# Patient Record
Sex: Female | Born: 1950 | Race: White | Hispanic: No
Health system: Midwestern US, Community
[De-identification: ages and names within clinical notes are randomized; demographics above are authoritative.]

## PROBLEM LIST (undated history)

## (undated) DIAGNOSIS — Z01811 Encounter for preprocedural respiratory examination: Secondary | ICD-10-CM

## (undated) DIAGNOSIS — I639 Cerebral infarction, unspecified: Secondary | ICD-10-CM

## (undated) DIAGNOSIS — Q211 Atrial septal defect: Secondary | ICD-10-CM

## (undated) DIAGNOSIS — E785 Hyperlipidemia, unspecified: Secondary | ICD-10-CM

## (undated) DIAGNOSIS — G43909 Migraine, unspecified, not intractable, without status migrainosus: Secondary | ICD-10-CM

## (undated) DIAGNOSIS — E119 Type 2 diabetes mellitus without complications: Secondary | ICD-10-CM

## (undated) DIAGNOSIS — Q2112 Patent foramen ovale: Secondary | ICD-10-CM

## (undated) DIAGNOSIS — J302 Other seasonal allergic rhinitis: Secondary | ICD-10-CM

## (undated) DIAGNOSIS — G4730 Sleep apnea, unspecified: Secondary | ICD-10-CM

## (undated) DIAGNOSIS — I341 Nonrheumatic mitral (valve) prolapse: Secondary | ICD-10-CM

## (undated) HISTORY — PX: ABDOMINAL HYSTERECTOMY: SHX81

## (undated) HISTORY — PX: CHOLECYSTECTOMY: SHX55

## (undated) HISTORY — DX: Atrial septal defect: Q21.1

## (undated) HISTORY — PX: CARPAL TUNNEL RELEASE: SHX101

## (undated) HISTORY — PX: TOTAL ABDOMINAL HYSTERECTOMY W/ BILATERAL SALPINGO-OOPHORECTOMY: SHX83

## (undated) HISTORY — PX: HYSTERECTOMY: SHX81

---

## 2000-03-19 NOTE — Op Note (Signed)
[facility]                                OPERATION REPORT   NAME:         Ann Jacobson, Ann Jacobson   MR #:         [id number]                    DATE:           04/09/2000   BILLING #:    [id number]                   PT. LOCATION:   SS #          [id number]   W. SCOTT WOODDELL, DPM   cc:   W. SCOTT WOODDELL, DPM   LANIER SYSTEM DID NOT RECORD ANY SOUND OR DICTATION ON THIS REPORT

## 2000-03-19 NOTE — Op Note (Signed)
[facility]                                OPERATION REPORT   NAME:         Jacobson, Ann   MR #:         [id number]                    DATE:           03/19/2000   BILLING #:                                PT. LOCATION:   SS #   W. SCOTT WOODDELL, DPM   cc:   W. SCOTT WOODDELL, DPM   PREOPERATIVE DIAGNOSIS:   Chronic plantar fasciitis, left foot.   POSTOPERATIVE DIAGNOSIS:   Same.   OPERATIVE PROCEDURE:   Plantar fasciotomy of left foot.   SURGEON:   W. Scott Wooddell, D.P.M.   ANESTHESIA:   Local with I.V. sedation.   DESCRIPTION OF PROCEDURE:  The patient was brought to the operating room   and placed on the operating room table in the supine position.  Following   I.V. sedation, local anesthesia was achieved utilizing a posterior tibial   nerve block administered to the left ankle.  The foot was then prepped and   draped in the usual aseptic technique and following exsanguination,   hemostasis was achieved via the use of a pneumatic ankle tourniquet set at   250 millimeters of mercury.   At this time, attention was directed to the plantar aspect of the left   foot.  Just distal to the plantar calcaneal fat pad, a transverse 2   centimeter incision was placed.  The incision was carried down via blunt   dissection to the level of the plantar fascia.  Utilizing a #64 surgical   Beaver blade, the medial 2/3 of the thick and fibrotic plantar fascial band   were incised and released.  The wound was flushed with sterile saline   solution and the wound closed with 4-0 Prolene in a single interrupted   fashion.   Following application of a sterile postoperative dressing, the pneumatic   ankle tourniquet was deflated and immediate vascular return was noted to   the digits.  The patient tolerated the   surgery and anesthesia well and left the OR with vital signs stable and   vascular status to the foot intact.

## 2000-04-09 NOTE — Op Note (Signed)
[facility]                                OPERATION REPORT   NAMEGLORIANN, Ann Jacobson   MR #:         [id number]                    DATE:           04/09/2000   BILLING #:    [id number]                   PT. LOCATION:   SS #          [id number]   W. Doroteo Bradford, DPM   cc:   W. SCOTT WOODDELL, DPM   LANIER SYSTEM DID NOT RECORD ANY SOUND OR DICTATION ON THIS REPORT

## 2000-04-22 NOTE — Op Note (Signed)
[facility]                                OPERATION REPORT   NAMEEMELDA, KOHLBECK   MR #:         [id number]                    DATE:           03/19/2000   BILLING #:                                PT. LOCATION:   SS #   W. Doroteo Bradford, DPM   cc:   W. SCOTT WOODDELL, DPM   PREOPERATIVE DIAGNOSIS:   Chronic plantar fasciitis, left foot.   POSTOPERATIVE DIAGNOSIS:   Same.   OPERATIVE PROCEDURE:   Plantar fasciotomy of left foot.   SURGEON:   Gerre Couch, D.P.M.   ANESTHESIA:   Local with I.V. sedation.   DESCRIPTION OF PROCEDURE:  The patient was brought to the operating room   and placed on the operating room table in the supine position.  Following   I.V. sedation, local anesthesia was achieved utilizing a posterior tibial   nerve block administered to the left ankle.  The foot was then prepped and   draped in the usual aseptic technique and following exsanguination,   hemostasis was achieved via the use of a pneumatic ankle tourniquet set at   250 millimeters of mercury.   At this time, attention was directed to the plantar aspect of the left   foot.  Just distal to the plantar calcaneal fat pad, a transverse 2   centimeter incision was placed.  The incision was carried down via blunt   dissection to the level of the plantar fascia.  Utilizing a #64 surgical   Beaver blade, the medial 2/3 of the thick and fibrotic plantar fascial band   were incised and released.  The wound was flushed with sterile saline   solution and the wound closed with 4-0 Prolene in a single interrupted   fashion.   Following application of a sterile postoperative dressing, the pneumatic   ankle tourniquet was deflated and immediate vascular return was noted to   the digits.  The patient tolerated the   surgery and anesthesia well and left the OR with vital signs stable and   vascular status to the foot intact.

## 2009-08-01 ENCOUNTER — Ambulatory Visit: Payer: Self-pay | Admitting: Internal Medicine

## 2010-04-27 ENCOUNTER — Ambulatory Visit: Payer: Self-pay | Admitting: Ophthalmology

## 2010-05-16 ENCOUNTER — Ambulatory Visit: Payer: Self-pay | Admitting: Ophthalmology

## 2011-09-05 ENCOUNTER — Ambulatory Visit: Payer: Self-pay | Admitting: Internal Medicine

## 2011-12-06 ENCOUNTER — Ambulatory Visit: Payer: Self-pay | Admitting: Unknown Physician Specialty

## 2013-01-19 ENCOUNTER — Ambulatory Visit: Payer: Self-pay | Admitting: Internal Medicine

## 2013-02-24 ENCOUNTER — Encounter (HOSPITAL_COMMUNITY): Payer: Self-pay | Admitting: Emergency Medicine

## 2013-02-24 ENCOUNTER — Emergency Department (HOSPITAL_COMMUNITY): Payer: Managed Care, Other (non HMO)

## 2013-02-24 ENCOUNTER — Inpatient Hospital Stay (HOSPITAL_COMMUNITY): Payer: Managed Care, Other (non HMO)

## 2013-02-24 ENCOUNTER — Inpatient Hospital Stay (HOSPITAL_COMMUNITY)
Admission: EM | Admit: 2013-02-24 | Discharge: 2013-03-03 | DRG: 065 | Disposition: A | Discharge status: Rehabilitation Facility | Payer: Managed Care, Other (non HMO) | Attending: Neurology | Admitting: Neurology

## 2013-02-24 DIAGNOSIS — Z006 Encounter for examination for normal comparison and control in clinical research program: Secondary | ICD-10-CM

## 2013-02-24 DIAGNOSIS — R1310 Dysphagia, unspecified: Secondary | ICD-10-CM | POA: Diagnosis present

## 2013-02-24 DIAGNOSIS — R29810 Facial weakness: Secondary | ICD-10-CM | POA: Diagnosis present

## 2013-02-24 DIAGNOSIS — Z833 Family history of diabetes mellitus: Secondary | ICD-10-CM

## 2013-02-24 DIAGNOSIS — M25511 Pain in right shoulder: Secondary | ICD-10-CM

## 2013-02-24 DIAGNOSIS — Z823 Family history of stroke: Secondary | ICD-10-CM

## 2013-02-24 DIAGNOSIS — I6340 Cerebral infarction due to embolism of unspecified cerebral artery: Principal | ICD-10-CM | POA: Diagnosis present

## 2013-02-24 DIAGNOSIS — Z23 Encounter for immunization: Secondary | ICD-10-CM

## 2013-02-24 DIAGNOSIS — T68XXXA Hypothermia, initial encounter: Secondary | ICD-10-CM

## 2013-02-24 DIAGNOSIS — I6350 Cerebral infarction due to unspecified occlusion or stenosis of unspecified cerebral artery: Secondary | ICD-10-CM

## 2013-02-24 DIAGNOSIS — R471 Dysarthria and anarthria: Secondary | ICD-10-CM | POA: Diagnosis present

## 2013-02-24 DIAGNOSIS — E876 Hypokalemia: Secondary | ICD-10-CM

## 2013-02-24 DIAGNOSIS — IMO0001 Reserved for inherently not codable concepts without codable children: Secondary | ICD-10-CM

## 2013-02-24 DIAGNOSIS — D509 Iron deficiency anemia, unspecified: Secondary | ICD-10-CM | POA: Diagnosis present

## 2013-02-24 DIAGNOSIS — Z6841 Body Mass Index (BMI) 40.0 and over, adult: Secondary | ICD-10-CM

## 2013-02-24 DIAGNOSIS — H53469 Homonymous bilateral field defects, unspecified side: Secondary | ICD-10-CM | POA: Diagnosis present

## 2013-02-24 DIAGNOSIS — G4733 Obstructive sleep apnea (adult) (pediatric): Secondary | ICD-10-CM

## 2013-02-24 DIAGNOSIS — I824Z9 Acute embolism and thrombosis of unspecified deep veins of unspecified distal lower extremity: Secondary | ICD-10-CM | POA: Diagnosis present

## 2013-02-24 DIAGNOSIS — I639 Cerebral infarction, unspecified: Secondary | ICD-10-CM | POA: Diagnosis present

## 2013-02-24 DIAGNOSIS — Q2112 Patent foramen ovale: Secondary | ICD-10-CM

## 2013-02-24 DIAGNOSIS — E785 Hyperlipidemia, unspecified: Secondary | ICD-10-CM | POA: Diagnosis present

## 2013-02-24 DIAGNOSIS — Z880 Allergy status to penicillin: Secondary | ICD-10-CM

## 2013-02-24 DIAGNOSIS — Q211 Atrial septal defect: Secondary | ICD-10-CM

## 2013-02-24 DIAGNOSIS — R680 Hypothermia, not associated with low environmental temperature: Secondary | ICD-10-CM | POA: Diagnosis present

## 2013-02-24 DIAGNOSIS — G8190 Hemiplegia, unspecified affecting unspecified side: Secondary | ICD-10-CM | POA: Diagnosis present

## 2013-02-24 DIAGNOSIS — I63511 Cerebral infarction due to unspecified occlusion or stenosis of right middle cerebral artery: Secondary | ICD-10-CM

## 2013-02-24 DIAGNOSIS — Z9089 Acquired absence of other organs: Secondary | ICD-10-CM

## 2013-02-24 DIAGNOSIS — M25519 Pain in unspecified shoulder: Secondary | ICD-10-CM | POA: Diagnosis present

## 2013-02-24 DIAGNOSIS — Q2111 Secundum atrial septal defect: Secondary | ICD-10-CM

## 2013-02-24 DIAGNOSIS — I82459 Acute embolism and thrombosis of unspecified peroneal vein: Secondary | ICD-10-CM

## 2013-02-24 HISTORY — DX: Migraine, unspecified, not intractable, without status migrainosus: G43.909

## 2013-02-24 HISTORY — DX: Nonrheumatic mitral (valve) prolapse: I34.1

## 2013-02-24 HISTORY — DX: Sleep apnea, unspecified: G47.30

## 2013-02-24 HISTORY — DX: Type 2 diabetes mellitus without complications: E11.9

## 2013-02-24 LAB — CBC
HCT: 39.2 % (ref 36.0–46.0)
Hemoglobin: 13.5 g/dL (ref 12.0–15.0)
MCH: 29.3 pg (ref 26.0–34.0)
MCHC: 34.4 g/dL (ref 30.0–36.0)
MCV: 85.2 fL (ref 78.0–100.0)
RDW: 14 % (ref 11.5–15.5)
WBC: 12.6 10*3/uL — ABNORMAL HIGH (ref 4.0–10.5)

## 2013-02-24 LAB — POCT I-STAT, CHEM 8
BUN: 19 mg/dL (ref 6–23)
Chloride: 107 mEq/L (ref 96–112)
Creatinine, Ser: 1 mg/dL (ref 0.50–1.10)
HCT: 40 % (ref 36.0–46.0)
Hemoglobin: 13.6 g/dL (ref 12.0–15.0)
Potassium: 3.4 mEq/L — ABNORMAL LOW (ref 3.5–5.1)
Sodium: 141 mEq/L (ref 135–145)
TCO2: 18 mmol/L (ref 0–100)

## 2013-02-24 LAB — RAPID URINE DRUG SCREEN, HOSP PERFORMED
Opiates: POSITIVE — AB
Tetrahydrocannabinol: NOT DETECTED

## 2013-02-24 LAB — TROPONIN I: Troponin I: <0.3 ng/mL (ref <0.30)

## 2013-02-24 LAB — URINALYSIS, ROUTINE W REFLEX MICROSCOPIC
Glucose, UA: NEGATIVE mg/dL
Hgb urine dipstick: NEGATIVE
Leukocytes, UA: NEGATIVE
Nitrite: NEGATIVE
Protein, ur: NEGATIVE mg/dL
Specific Gravity, Urine: 1.022 (ref 1.005–1.030)
Urobilinogen, UA: 0.2 mg/dL (ref 0.0–1.0)
pH: 5.5 (ref 5.0–8.0)

## 2013-02-24 LAB — COMPREHENSIVE METABOLIC PANEL
ALT: 15 U/L (ref 0–35)
BUN: 19 mg/dL (ref 6–23)
CO2: 21 mEq/L (ref 19–32)
Calcium: 9.3 mg/dL (ref 8.4–10.5)
Chloride: 106 mEq/L (ref 96–112)
Creatinine, Ser: 0.8 mg/dL (ref 0.50–1.10)
GFR calc Af Amer: 90 mL/min — ABNORMAL LOW (ref >90)
GFR calc non Af Amer: 77 mL/min — ABNORMAL LOW (ref >90)
Sodium: 140 mEq/L (ref 135–145)
Total Bilirubin: 0.5 mg/dL (ref 0.3–1.2)

## 2013-02-24 LAB — APTT: aPTT: 28 seconds (ref 24–37)

## 2013-02-24 LAB — DIFFERENTIAL
Basophils Absolute: 0 10*3/uL (ref 0.0–0.1)
Eosinophils Absolute: 0.1 10*3/uL (ref 0.0–0.7)
Eosinophils Relative: 1 % (ref 0–5)
Lymphocytes Relative: 13 % (ref 12–46)
Monocytes Absolute: 0.7 10*3/uL (ref 0.1–1.0)
Monocytes Relative: 5 % (ref 3–12)
Neutro Abs: 10.2 10*3/uL — ABNORMAL HIGH (ref 1.7–7.7)

## 2013-02-24 LAB — GLUCOSE, CAPILLARY
Glucose-Capillary: 115 mg/dL — ABNORMAL HIGH (ref 70–99)
Glucose-Capillary: 214 mg/dL — ABNORMAL HIGH (ref 70–99)

## 2013-02-24 LAB — POCT I-STAT TROPONIN I: Troponin i, poc: 0 ng/mL (ref 0.00–0.08)

## 2013-02-24 LAB — CK: Total CK: 248 U/L — ABNORMAL HIGH (ref 7–177)

## 2013-02-24 MED ORDER — INSULIN ASPART 100 UNIT/ML ~~LOC~~ SOLN
0.0000 [IU] | SUBCUTANEOUS | Status: DC
Start: 2013-02-24 — End: 2013-02-25
  Administered 2013-02-24 – 2013-02-25 (×2): 3 [IU] via SUBCUTANEOUS
  Administered 2013-02-25: 5 [IU] via SUBCUTANEOUS
  Administered 2013-02-25: 3 [IU] via SUBCUTANEOUS
  Administered 2013-02-25: 5 [IU] via SUBCUTANEOUS
  Administered 2013-02-25: 3 [IU] via SUBCUTANEOUS

## 2013-02-24 MED ORDER — SODIUM CHLORIDE 0.9 % IV SOLN
INTRAVENOUS | Status: DC
  Administered 2013-02-24 – 2013-02-25 (×2): via INTRAVENOUS
  Administered 2013-03-02: 500 mL via INTRAVENOUS

## 2013-02-24 MED ORDER — MIDAZOLAM HCL 5 MG/5ML IJ SOLN
4.0000 mg | Freq: Once | INTRAMUSCULAR | Status: DC
  Administered 2013-02-24: 1 mg via INTRAVENOUS
  Administered 2013-02-24: 0.5 mg via INTRAVENOUS

## 2013-02-24 MED ORDER — ASPIRIN 325 MG PO TABS
325.0000 mg | ORAL_TABLET | Freq: Every day | ORAL | Status: DC
  Administered 2013-02-25 – 2013-03-03 (×7): 325 mg via ORAL
  Filled 2013-02-24 (×7): qty 1

## 2013-02-24 MED ORDER — SODIUM CHLORIDE 0.9 % IV SOLN
INTRAVENOUS | Status: AC
  Administered 2013-02-24: 15:00:00 via INTRAVENOUS

## 2013-02-24 MED ORDER — POTASSIUM CHLORIDE 10 MEQ/100ML IV SOLN: 10.0000 meq | INTRAVENOUS | Status: AC

## 2013-02-24 MED ORDER — POTASSIUM CHLORIDE 10 MEQ/100ML IV SOLN
10.0000 meq | INTRAVENOUS | Status: AC
  Administered 2013-02-24 – 2013-02-25 (×2): 10 meq via INTRAVENOUS
  Filled 2013-02-24 (×2): qty 100

## 2013-02-24 MED ORDER — HEPARIN SODIUM (PORCINE) 5000 UNIT/ML IJ SOLN
5000.0000 [IU] | Freq: Three times a day (TID) | INTRAMUSCULAR | Status: DC
  Administered 2013-02-24 – 2013-03-02 (×17): 5000 [IU] via SUBCUTANEOUS
  Filled 2013-02-24 (×20): qty 1

## 2013-02-24 MED ORDER — CEFAZOLIN SODIUM 1-5 GM-% IV SOLN
1.0000 g | Freq: Once | INTRAVENOUS | Status: AC
  Administered 2013-02-24: 1 g via INTRAVENOUS
  Filled 2013-02-24: qty 50

## 2013-02-24 MED ORDER — LIDOCAINE VISCOUS 2 % MT SOLN
15.0000 mL | Freq: Once | OROMUCOSAL | Status: AC
  Administered 2013-02-24: 15 mL via OROMUCOSAL
  Filled 2013-02-24: qty 15

## 2013-02-24 MED ORDER — MIDAZOLAM HCL 2 MG/2ML IJ SOLN
4.0000 mg | Freq: Once | INTRAMUSCULAR | Status: DC
  Filled 2013-02-24: qty 4

## 2013-02-24 MED ORDER — ACETAMINOPHEN 650 MG RE SUPP: 650.0000 mg | RECTAL | Status: DC | PRN

## 2013-02-24 MED ORDER — ASPIRIN 300 MG RE SUPP
300.0000 mg | Freq: Every day | RECTAL | Status: DC
  Administered 2013-02-24: 300 mg via RECTAL
  Filled 2013-02-24 (×9): qty 1

## 2013-02-24 MED ORDER — HYDROMORPHONE HCL PF 1 MG/ML IJ SOLN
1.0000 mg | Freq: Once | INTRAMUSCULAR | Status: DC
  Filled 2013-02-24: qty 1

## 2013-02-24 MED ORDER — ACETAMINOPHEN 325 MG PO TABS
650.0000 mg | ORAL_TABLET | ORAL | Status: DC | PRN
  Administered 2013-02-25 – 2013-03-01 (×4): 650 mg via ORAL
  Filled 2013-02-24 (×4): qty 2

## 2013-02-24 MED ORDER — MORPHINE SULFATE 4 MG/ML IJ SOLN
4.0000 mg | Freq: Once | INTRAMUSCULAR | Status: AC
  Administered 2013-02-24: 4 mg via INTRAVENOUS
  Filled 2013-02-24: qty 1

## 2013-02-24 MED ORDER — FENTANYL CITRATE 0.05 MG/ML IJ SOLN
100.0000 ug | Freq: Once | INTRAMUSCULAR | Status: AC
  Administered 2013-02-24: 12.5 ug via INTRAVENOUS
  Administered 2013-02-24: 25 ug via INTRAVENOUS
  Filled 2013-02-24: qty 2

## 2013-02-24 NOTE — ED Notes (Signed)
MRI placed on hold until after pt speaks with Gerri Spore, Charity fundraiser.

## 2013-02-24 NOTE — ED Notes (Signed)
Patient CBG is 115 Nurse was informed.

## 2013-02-24 NOTE — ED Notes (Signed)
Procedure complete.

## 2013-02-24 NOTE — Research (Signed)
Patient was admitted to the hospital for acute stroke. During evaluation, patient was identified as a potential IMPACT-24 trial candidate. Patient was given the informed consent to read and given the opportunity to ask questions. After review of the IMPACT-24 informed consent, patient agreed to participate. Patient however was only able to make an X on the consent for signature. The consent form was witnessed by the emergency department RN. Patient daughter also provided a second signature for surrogate consent. A copy of the informed consent was given to patient for personal record. Patient met the inclusion/exclusion criteria and was randomized into the trial. Patient underwent the implant procedure by Dr. Corliss Skains. Light sedation was used along with local anesthesia. No complication were reported during the procedure. Patient was started on ISS treatment at 20:28 and transported to 4N12.

## 2013-02-24 NOTE — H&P (Signed)
History and Physical       Hospital Admission Note Date: 02/24/2013  Patient name: Patricia Black Medical record number: [id number] Date of birth: 1950/10/19 Age: 62 y.o. Gender: female PCP: No primary provider on file.    Chief Complaint:  Left sided weakness with dizziness today  HPI: Patient is a 62 year old female with history of diabetes, migraine headaches who is otherwise very functional was brought via EMS to the ER for symptoms. History was obtained from the patient who reported that when she woke up at 5 AM this morning, she was feeling dizzy. She reached out for a copy and noticed that her left arm was weak. She also had difficulty getting out of the bed due to left leg weakness. Patient reports that she was supposed to be going to [place] today. She went out the back door and fell on the outside porch. Per patient, she laid there from 12 to 1:30 PM in the rain and was unable to get up. EMS found her outside and it brought to the ER, patient was cool to touch, hypothermic with temperature 93 degree F. Patient reports that she was at her baseline status at 11:30 PM last night when she went to bed. CT of the head shows a dense right middle cerebral artery thrombosis. Hospitalist service was requested for admission.  Review of Systems:  Constitutional: Denies fever, chills, diaphoresis, poor appetite and fatigue.  HEENT: Denies photophobia, eye pain, redness, hearing loss, ear pain, congestion, sore throat, rhinorrhea, sneezing, mouth sores, trouble swallowing, neck pain, neck stiffness and tinnitus.   Respiratory: Denies SOB, DOE, cough, chest tightness,  and wheezing.   Cardiovascular: Denies chest pain, palpitations and leg swelling.  Gastrointestinal: Denies nausea, vomiting, abdominal pain, diarrhea, constipation, blood in stool and abdominal distention.  Genitourinary: Denies dysuria, urgency, frequency, hematuria, flank pain  and difficulty urinating.  Musculoskeletal: Denies myalgias, back pain, joint swelling, arthralgias and gait problem.  Skin: Denies pallor, rash and wound.  Neurological:  please see history of present illness  Hematological: Denies adenopathy. Easy bruising, personal or family bleeding history  Psychiatric/Behavioral: Denies suicidal ideation, mood changes, confusion, nervousness, sleep disturbance and agitation  Past Medical History: Past Medical History  Diagnosis Date  . Diabetes mellitus without complication   . Migraine    Past Surgical History  Procedure Laterality Date  . Abdominal hysterectomy      Medications: Prior to Admission medications   Medication Sig Start Date End Date Taking? Authorizing Provider  atorvastatin (LIPITOR) 80 MG tablet Take 80 mg by mouth daily at 6 PM.    Historical Provider, MD  fluticasone (FLONASE) 50 MCG/ACT nasal spray Place 1 spray into both nostrils daily as needed for allergies or rhinitis.    Historical Provider, MD  Fluticasone-Salmeterol (ADVAIR) 100-50 MCG/DOSE AEPB Inhale 1 puff into the lungs 2 (two) times daily.    Historical Provider, MD  hydrochlorothiazide (HYDRODIURIL) 25 MG tablet Take 25 mg by mouth daily.    Historical Provider, MD  insulin lispro (HUMALOG) 100 UNIT/ML injection Inject into the skin 3 (three) times daily before meals.    Historical Provider, MD  propranolol ER (INDERAL LA) 160 MG SR capsule Take 160 mg by mouth daily.    Historical Provider, MD  Topiramate ER (TROKENDI XR) 100 MG CP24 Take 100 mg by mouth daily.    Historical Provider, MD  Topiramate ER (TROKENDI XR) 50 MG CP24 Take 50 mg by mouth daily.    Historical Provider, MD    Allergies:  Allergies  Allergen Reactions  . Penicillins Other (See Comments)    Unknown reaction    Social History:  reports that she has never smoked. She does not have any smokeless tobacco history on file. She reports that she does not drink alcohol or use illicit  drugs.  Family History: History reviewed. No pertinent family history.  Physical Exam: Blood pressure 110/38, pulse 53, temperature 98.1 F (36.7 C), temperature source Oral, resp. rate 12, weight 123.832 kg (273 lb), SpO2 98.00%. General: Alert, awake, oriented x3, in no acute distress, Appropriately answering questions, bair hugger on  HEENT: normocephalic, atraumatic, anicteric sclera, pink conjunctiva, pupils equal and reactive to light and accomodation, oropharynx clear Neck: supple, no masses or lymphadenopathy, no goiter, no bruits  Heart: Regular rate and rhythm, without murmurs, rubs or gallops. Lungs: Clear to auscultation bilaterally, no wheezing, rales or rhonchi. Abdomen: Soft, nontender, nondistended, positive bowel sounds, no masses. Extremities: No clubbing, cyanosis or edema with positive pedal pulses. Neuro: Left facial droop, otherwise cranial nerves are intact, mild dysarthria, left upper and lower extremity 3/5, right upper and lower extremity 5/5  Psych: alert and oriented x 3, normal mood and affect Skin: no rashes or lesions, warm and dry   LABS on Admission:  Basic Metabolic Panel:  Recent Labs Lab 02/24/13 1429 02/24/13 1513  NA 140 141  K 3.7 3.4*  CL 106 107  CO2 21  --   GLUCOSE 131* 134*  BUN 19 19  CREATININE 0.80 1.00  CALCIUM 9.3  --    Liver Function Tests:  Recent Labs Lab 02/24/13 1429  AST 18  ALT 15  ALKPHOS 139*  BILITOT 0.5  PROT 7.1  ALBUMIN 3.5   No results found for this basename: LIPASE, AMYLASE,  in the last 168 hours No results found for this basename: AMMONIA,  in the last 168 hours CBC:  Recent Labs Lab 02/24/13 1429 02/24/13 1513  WBC 12.6*  --   NEUTROABS 10.2*  --   HGB 13.5 13.6  HCT 39.2 40.0  MCV 85.2  --   PLT 241  --    Cardiac Enzymes:  Recent Labs Lab 02/24/13 1429  CKTOTAL 248*  TROPONINI <0.30   BNP: No components found with this basename: POCBNP,  CBG:  Recent Labs Lab  02/24/13 1451  GLUCAP 115*     Radiological Exams on Admission: Ct Head Wo Contrast  02/24/2013   CLINICAL DATA:  Slurred speech found unresponsive  EXAM: CT HEAD WITHOUT CONTRAST  TECHNIQUE: Contiguous axial images were obtained from the base of the skull through the vertex without intravenous contrast.  COMPARISON:  None.  FINDINGS: The bony calvarium is intact. No gross soft tissue abnormality is noted. There is density identified within the right middle cerebral artery which may represent acute thrombosis. No acute hemorrhage is seen. The lateral most aspect of the basal ganglia on the right there is some generalized decreased attenuation identified best seen on image number 16 of series 3. This is suspicious for early ischemic change. No other focal abnormality is noted.  IMPRESSION: Dense right middle cerebral artery as described.  Area of vague decreased attenuation in the right basal ganglia laterally suspicious for acute ischemia.  These results were called by telephone at the time of interpretation on 02/24/2013 at 2:56 PM to the attending emergency room physician, who verbally acknowledged these results.   Electronically Signed   By: Alcide Clever M.D.   On: 02/24/2013 14:57    Assessment/Plan Principal  Problem:  Acute CVA (cerebral infarction) CT head shows dense right middle cerebral artery thrombosis with decreased attenuation in the right basal ganglia suspicious for acute ischemia. Patient is out of TPA window, lasting normal was 11:30pm last night. -  patient will be admitted for full stroke workup, obtain MRI, MRA brain  - Obtain 2-D echo, carotid Dopplers, HbA1c, lipid panel, serial neuro checks  - N.p.o. until swallow evaluation, speech therapy, PT, OT evaluation  - Aspirin suppository until able to eat, continue IV fluid hydration - Neurology has been consulted, discussed with Dr. Amada Jupiter  Active Problems: Diabetes mellitus  - For now place on sliding scale insulin  q4hours, obtain hemoglobin A1c     Hypothermia: Improved with the Bair hugger, lasted temp 98.1, no signs of infection, although white count is 12.6 could be stress demargination - Obtain UA, chest x-ray for further workup      Hypokalemia - Replace IV   DVT prophylaxis:  HEPARIN [place]  CODE STATUS:  full CODE STATUS   Family Communication: Admission, patients condition and plan of care including tests being ordered have been discussed with the patient who indicates understanding and agree with the plan and Code Status   Further plan will depend as patient's clinical course evolves and further radiologic and laboratory data become available.   Time Spent on Admission: 1 hour  RAI,RIPUDEEP M.D. [facility] 02/24/2013, 5:34 PM Pager: [phone]  If 7PM-7AM, please contact night-coverage [email or web address] Password [email or web address]

## 2013-02-24 NOTE — Code Documentation (Signed)
62yo female arriving to [facility] at 41 via [facility].  EMS reports she was found down outside of her house by the fire department and that she fell down the stairs going to her car.  Her daughter had called looking for her when she had not heard from her as the patient was supposed to be heading out of town this morning.  Patient reports that she was at her baseline at 2330 last night when she went to bed.  She woke up at 0515 this morning with dizziness, then noticed left arm weakness when she went to grab a cup.  She reports that she went outside at 1200, but she is cold to touch and her clothes are saturated; unclear how long she has actually been outside in the rain.  Code stroke called at 1414, patient arrival at 3, LKW at 2330, EDP exam/cleared for CT at 1430, stroke team arrival at 54, neurologist arrival 53, patient arrival in CT at 49, phlebotomist arrival at 42.  NIHSS 7 on arrival, see documentation for details.  Patient is outside the window for treatment with tPA.  Research RN notified of possible enrollment in research trial.  Bedside handoff with ED RN Lowella Bandy.

## 2013-02-24 NOTE — ED Notes (Signed)
Pt BP decreased to 101/35, Dr. Elesa Massed made aware and increased fluids to 125 ml/hr.

## 2013-02-24 NOTE — Consult Note (Signed)
Neurology Consultation Reason for Consult: Stroke Referring Physician: ward, Baxter Hire  CC: Left-sided weakness  History is obtained from: Patient  HPI: Patricia Black is a 62 y.o. female with a history of diabetes and migraine who presents with left-sided weakness that was present on awakening. She states that she was normal when she went to bed last night, but when she awoke at 5 AM she was dizzy, and was dropping things from her left hand. She went out to her car, but fell on her way and was unable to get back up. When her daughter had not heard from her by mid morning she called the [facility] Department who responded and found her next to her car and called EMS.   LKW: 11:30 PM tpa given?: no, outside of window NIHSS: 7    ROS: A 14 point ROS was performed and is negative except as noted in the HPI.  Past Medical History  Diagnosis Date  . Diabetes mellitus without complication   . Migraine     Family History: Grandfather-stroke  Social History: Tob: Denies  Exam: Current vital signs: BP 111/36  Pulse 56  Temp(Src) 98.1 F (36.7 C) (Oral)  Resp 25  Wt 123.832 kg (273 lb)  SpO2 99% Vital signs in last 24 hours: Temp:  [93.9 F (34.4 C)-98.1 F (36.7 C)] 98.1 F (36.7 C) (11/26 1719) Pulse Rate:  [46-69] 56 (11/26 1940) Resp:  [11-25] 25 (11/26 1940) BP: (93-119)/(29-98) 111/36 mmHg (11/26 1940) SpO2:  [96 %-100 %] 99 % (11/26 1940) Weight:  [123.832 kg (273 lb)] 123.832 kg (273 lb) (11/26 1459)  General: In bed, NAD CV: Regular rate and rhythm Mental Status: Patient is awake, alert, oriented to person, place, month, year, and situation. Immediate and remote memory are intact. Patient is able to give a clear and coherent history. No signs of aphasia or neglect. There is no extinction to either visual stimuli or tactile stimuli, when describing the "cookie caper" she is able to describe the entire picture Cranial Nerves: II: Visual Fields are full. Pupils  are equal, round, and reactive to light.  Discs are difficult to visualize. III,IV, VI: EOMI without ptosis or diploplia.  V: Facial sensation is symmetric to temperature VII: Facial movement is decreased on the left VIII: hearing is intact to voice X: Uvula elevates symmetrically XI: Shoulder shrug is symmetric. XII: tongue is midline without atrophy or fasciculations.  Motor: Tone is normal. Bulk is normal. 5/5 strength was present in right arm and leg, she has 3/5 weakness of the left arm and leg Sensory: Sensation is symmetric to pin in the arms and legs. Deep Tendon Reflexes: 2+ and symmetric in the biceps and patellae.  Cerebellar: FNF and HKS are intact on right, difficult to commensurate with weakness on left Gait: Unable to test due to weakness   I have reviewed labs in epic and the results pertinent to this consultation are: CMP-unremarkable CBC-mild leukocytosis  I have reviewed the images obtained: CT head-dense right MCA sign, peri-insular ribbon sign  Impression: 62 year old female with right MCA territory infarct. Her low NIH is likely indicative of good collateralization. She presented after 10 hours from symptom onset and therefore was not a candidate for acute intervention, however she does meet criteria for inclusion in the impact study.   The study is looking at improving collateralization through the use of sphenopalatine ganglion stimulation. Care would proceed as is typically be done other than the study device and periodic stimulation.  Recommendations: 1. HgbA1c,  fasting lipid panel 2. MRI, MRA  of the brain without contrast 3. Frequent neuro checks 4. Echocardiogram 5. Carotid dopplers 6. Prophylactic therapy-Antiplatelet med: Aspirin - dose 325mg  7. Risk factor modification 8. Telemetry monitoring 9. PT consult, OT consult, Speech consult    Ritta Slot, MD [facility] [phone]  If 7pm- 7am, please page neurology on  call at [phone].

## 2013-02-24 NOTE — ED Notes (Signed)
Dr. Rai at bedside 

## 2013-02-24 NOTE — ED Notes (Signed)
Pt arrives via EMS from home. Reportedly went to bed feeling fine. Woke up around 0500 this AM with left sided weakness, left facial droop and slurred speech. WEnt about the morning with shuffling gait, fell walking downstairs and found outside after fall by sherriff. Pt cool to touch on arrival wearing wet clothes. Pt awake, alert, oriented x4, with above noted deficits.

## 2013-02-24 NOTE — ED Notes (Signed)
Pts daughter called for update regarding patient. Informed caller that I am unable to give pt information and results over the telephone due to HIPPA regulations and caller stated she understands and will be here tonight to visit patient. Pt made aware of daughters call.

## 2013-02-24 NOTE — ED Notes (Signed)
Beryl Meager, RN, Equities trader at bedside to assess patient.

## 2013-02-24 NOTE — ED Notes (Signed)
NOTIFIED DR. WARD IN PERSON OF PATIENTS LAB RESULTS ( CODESTROKE) ALL LAB VALUES WITHIN NORMAL LEVELS, @15 :40PM, 02/24/2013.

## 2013-02-24 NOTE — ED Provider Notes (Addendum)
TIME SEEN: 2:40 PM  CHIEF COMPLAINT: Left-sided weakness, left-sided facial droop  HPI: Patient is a 62 y.o. F with history of diabetes, migraines who presents to the emergency department with left-sided weakness and facial droop that started this morning when she woke up. Patient reports that she went to bed at 11:30 PM and felt fine. When she woke up at 5 AM she felt dizzy which she described as vertiginous and went to reach for a cup and noticed that her left arm was weak. She had difficulty walking and getting out of bed secondary to left lower extremity weakness. She fell walking down the stairs to her car.  When patient did not show up for work, her daughter was contacted. She is department, patient outside of her house in the rain. Her blood glucose was 115. She states she is having a mild headache and states it is different than her prior migraines. No thunderclap HA.  ROS: See HPI Constitutional: no fever  Eyes: no drainage  ENT: no runny nose   Cardiovascular:  no chest pain  Resp: no SOB  GI: no vomiting GU: no dysuria Integumentary: no rash  Allergy: no hives  Musculoskeletal: no leg swelling  Neurological: no slurred speech ROS otherwise negative  PAST MEDICAL HISTORY/PAST SURGICAL HISTORY:  Diabetes, migraines  MEDICATIONS:  Prior to Admission medications   Not on File    ALLERGIES:  Allergies not on file  SOCIAL HISTORY:  History  Substance Use Topics  . Smoking status: Not on file  . Smokeless tobacco: Not on file  . Alcohol Use: Not on file    FAMILY HISTORY: No family history on file.  EXAM: CONSTITUTIONAL: Alert and oriented and responds appropriately to questions. Well-appearing; well-nourished HEAD: Normocephalic EYES: Conjunctivae clear, PERRL ENT: normal nose; no rhinorrhea; moist mucous membranes; pharynx without lesions noted NECK: Supple, no meningismus, no LAD  CARD: RRR; S1 and S2 appreciated; no murmurs, no clicks, no rubs, no  gallops RESP: Normal chest excursion without splinting or tachypnea; breath sounds clear and equal bilaterally; no wheezes, no rhonchi, no rales,  ABD/GI: Normal bowel sounds; non-distended; soft, non-tender, no rebound, no guarding BACK:  The back appears normal and is non-tender to palpation, there is no CVA tenderness EXT: Normal ROM in all joints; non-tender to palpation; no edema; normal capillary refill; no cyanosis    SKIN: Normal color for age and race; warm NEURO: Patient is a weak in her left upper and lower extremity with mild sensory deficit, left facial droop, otherwise cranial nerves are intact, pt has left sided extinction and mild dysarthria, NIHSS is 9 PSYCH: The patient's mood and manner are appropriate. Grooming and personal hygiene are appropriate.  MEDICAL DECISION MAKING: Patient with a stroke scale of 9 last normal was 11:30 PM. Pt is not a TPA candidate since last normal > 12 hours ago.  CT head shows dense right middle cerebral artery with decreased attenuation in the right basal ganglia suspicious for acute ischemia. Labs are otherwise unremarkable. CK is slightly elevated at 248. Discussed with Dr. Amada Jupiter he would like MRI, MRA brain prior to disposition.   EKG Interpretation    Date/Time:  Wednesday February 24 2013 14:57:40 EST Ventricular Rate:  57 PR Interval:  70 QRS Duration: 87 QT Interval:  479 QTC Calculation: 466 R Axis:   54 Text Interpretation:  Sinus rhythm Short PR interval Left atrial enlargement Borderline T abnormalities, anterior leads Confirmed by RAY MD, DANIELLE ([id number]) on 02/24/2013 3:08:37 PM  ED PROGRESS: Neurology would like medicine admission for subacute CVA.   Patient is still hemodynamically stable with no improvement of her neurologic symptoms. Her primary care physician is Einar Crow in [facility].     Layla Maw Keyden Pavlov, DO 02/24/13 1704  Layla Maw Rhea Kaelin, DO 02/24/13 1715  Layla Maw Tziporah Knoke,  DO 02/25/13 [phone]

## 2013-02-25 DIAGNOSIS — E785 Hyperlipidemia, unspecified: Secondary | ICD-10-CM

## 2013-02-25 DIAGNOSIS — G2581 Restless legs syndrome: Secondary | ICD-10-CM

## 2013-02-25 DIAGNOSIS — I369 Nonrheumatic tricuspid valve disorder, unspecified: Secondary | ICD-10-CM

## 2013-02-25 LAB — CBC WITH DIFFERENTIAL/PLATELET
Basophils Absolute: 0 10*3/uL (ref 0.0–0.1)
Basophils Relative: 0 % (ref 0–1)
Eosinophils Absolute: 0.1 10*3/uL (ref 0.0–0.7)
Eosinophils Relative: 1 % (ref 0–5)
HCT: 33.5 % — ABNORMAL LOW (ref 36.0–46.0)
Lymphs Abs: 1.9 10*3/uL (ref 0.7–4.0)
MCH: 28.9 pg (ref 26.0–34.0)
MCV: 85 fL (ref 78.0–100.0)
Monocytes Absolute: 0.6 10*3/uL (ref 0.1–1.0)
Neutrophils Relative %: 71 % (ref 43–77)
Platelets: 205 10*3/uL (ref 150–400)
RBC: 3.94 MIL/uL (ref 3.87–5.11)
RDW: 14.1 % (ref 11.5–15.5)

## 2013-02-25 LAB — COMPREHENSIVE METABOLIC PANEL
ALT: 13 U/L (ref 0–35)
AST: 21 U/L (ref 0–37)
Albumin: 3 g/dL — ABNORMAL LOW (ref 3.5–5.2)
Calcium: 8.4 mg/dL (ref 8.4–10.5)
GFR calc Af Amer: 82 mL/min — ABNORMAL LOW (ref >90)
Glucose, Bld: 247 mg/dL — ABNORMAL HIGH (ref 70–99)
Sodium: 137 mEq/L (ref 135–145)
Total Protein: 6.1 g/dL (ref 6.0–8.3)

## 2013-02-25 LAB — GLUCOSE, CAPILLARY
Glucose-Capillary: 211 mg/dL — ABNORMAL HIGH (ref 70–99)
Glucose-Capillary: 288 mg/dL — ABNORMAL HIGH (ref 70–99)

## 2013-02-25 LAB — LIPID PANEL
Cholesterol: 118 mg/dL (ref 0–200)
HDL: 35 mg/dL — ABNORMAL LOW (ref >39)
LDL Cholesterol: 61 mg/dL (ref 0–99)
Total CHOL/HDL Ratio: 3.4 RATIO

## 2013-02-25 LAB — HEMOGLOBIN A1C
Hgb A1c MFr Bld: 7.2 % — ABNORMAL HIGH (ref <5.7)
Mean Plasma Glucose: 160 mg/dL — ABNORMAL HIGH (ref <117)

## 2013-02-25 MED ORDER — INSULIN ASPART 100 UNIT/ML ~~LOC~~ SOLN
0.0000 [IU] | Freq: Three times a day (TID) | SUBCUTANEOUS | Status: DC
  Administered 2013-02-26: 3 [IU] via SUBCUTANEOUS
  Administered 2013-02-26: 5 [IU] via SUBCUTANEOUS
  Administered 2013-02-26: 7 [IU] via SUBCUTANEOUS
  Administered 2013-02-27 – 2013-02-28 (×4): 5 [IU] via SUBCUTANEOUS
  Administered 2013-02-28 (×2): 7 [IU] via SUBCUTANEOUS
  Administered 2013-03-01: 5 [IU] via SUBCUTANEOUS
  Administered 2013-03-01: 7 [IU] via SUBCUTANEOUS
  Administered 2013-03-01: 5 [IU] via SUBCUTANEOUS
  Administered 2013-03-02: 3 [IU] via SUBCUTANEOUS
  Administered 2013-03-02 – 2013-03-03 (×5): 5 [IU] via SUBCUTANEOUS

## 2013-02-25 MED ORDER — INSULIN ASPART 100 UNIT/ML ~~LOC~~ SOLN
0.0000 [IU] | Freq: Every day | SUBCUTANEOUS | Status: DC
  Administered 2013-02-25: 2 [IU] via SUBCUTANEOUS
  Administered 2013-02-26 – 2013-03-02 (×5): 3 [IU] via SUBCUTANEOUS

## 2013-02-25 MED ORDER — LIDOCAINE VISCOUS 2 % MT SOLN
15.0000 mL | Freq: Four times a day (QID) | OROMUCOSAL | Status: DC | PRN
  Administered 2013-02-25 – 2013-02-26 (×2): 15 mL via OROMUCOSAL
  Filled 2013-02-25 (×3): qty 15

## 2013-02-25 MED ORDER — ATORVASTATIN CALCIUM 80 MG PO TABS
80.0000 mg | ORAL_TABLET | Freq: Every day | ORAL | Status: DC
  Administered 2013-02-25 – 2013-03-03 (×7): 80 mg via ORAL
  Filled 2013-02-25 (×7): qty 1

## 2013-02-25 MED ORDER — PNEUMOCOCCAL VAC POLYVALENT 25 MCG/0.5ML IJ INJ
0.5000 mL | INJECTION | INTRAMUSCULAR | Status: AC
  Administered 2013-02-27: 0.5 mL via INTRAMUSCULAR
  Filled 2013-02-25: qty 0.5

## 2013-02-25 MED ORDER — INSULIN ASPART 100 UNIT/ML ~~LOC~~ SOLN: 0.0000 [IU] | Freq: Three times a day (TID) | SUBCUTANEOUS | Status: DC

## 2013-02-25 NOTE — Evaluation (Signed)
Clinical/Bedside Swallow Evaluation Patient Details  Name: Patricia Black MRN: [id number] Date of Birth: Jul 27, 1950  Today's Date: 02/25/2013 Time: 7829-5621 SLP Time Calculation (min): 25 min  Past Medical History:  Past Medical History  Diagnosis Date  . Diabetes mellitus without complication   . Migraine    Past Surgical History:  Past Surgical History  Procedure Laterality Date  . Abdominal hysterectomy     HPI:  Patricia Black is a 62 y.o. female with a history of diabetes and migraine who presents with left-sided weakness that was present on awakening. She states that she was normal when she went to bed last night, but when she awoke at 5 AM she was dizzy, and was dropping things from her left hand. She went out to her car, but fell on her way and was unable to get back up. When her daughter had not heard from her by mid morning she called the Patricia Black who responded and found her next to her car and called EMS. Pt failed stroke swallow screen on straw sip.   Assessment / Plan / Recommendation Clinical Impression  Pt demonstrated immediate cough with sips of thin liquid from straw- 2 of 6 sips. When cued to take small sips, no cough occurred. No s/s of aspiration when straw removed. Pt demonstrated mildly prolonged oral phase with solid but no oral residue was present. Pt had difficulties feeding herself applesauce with L hand; when cued to switch to R, no problems. Given these findings, pt is at high risk of aspiration with straw sips of thin liquid, but risk is significantly reduced with cup sips. Rx thin liquid diet, no straws, regular foods. Meds whole in puree. Intermittent supervision needed to cue for small sips/ bites. Speech will continue to follow for diet tolerance/ advancement.    Aspiration Risk  Mild (with removal of straws)    Diet Recommendation Regular;Thin liquid   Liquid Administration via: Cup;No straw Medication Administration: Whole meds with  puree Supervision: Intermittent supervision to cue for compensatory strategies Compensations: Slow rate;Small sips/bites    Other  Recommendations Oral Care Recommendations: Oral care BID   Follow Up Recommendations       Frequency and Duration min 2x/week  2 weeks   Pertinent Vitals/Pain n/a    SLP Swallow Goals     Swallow Study Prior Functional Status       General HPI: Patricia Black is a 62 y.o. female with a history of diabetes and migraine who presents with left-sided weakness that was present on awakening. She states that she was normal when she went to bed last night, but when she awoke at 5 AM she was dizzy, and was dropping things from her left hand. She went out to her car, but fell on her way and was unable to get back up. When her daughter had not heard from her by mid morning she called the Patricia Black who responded and found her next to her car and called EMS. Pt failed stroke swallow screen on straw sip. Type of Study: Bedside swallow evaluation Diet Prior to this Study: NPO Temperature Spikes Noted: No Respiratory Status: Room air History of Recent Intubation: No Behavior/Cognition: Alert;Cooperative Oral Cavity - Dentition: Adequate natural dentition Self-Feeding Abilities: Able to feed self;Other (Comment) (Pt left handed; now needs to eat w R hand- may need cueing) Patient Positioning: Upright in bed Baseline Vocal Quality: Clear Volitional Cough: Strong Volitional Swallow: Able to elicit    Oral/Motor/Sensory Function Overall Oral Motor/Sensory Function: Impaired  Labial ROM: Reduced left Labial Symmetry: Abnormal symmetry left Labial Strength: Within Functional Limits Labial Sensation: Within Functional Limits Lingual ROM: Within Functional Limits Lingual Symmetry: Within Functional Limits   Ice Chips Ice chips: Not tested   Thin Liquid Thin Liquid: Impaired Presentation: Cup;Straw Pharyngeal  Phase Impairments: Cough - Immediate;Other  (comments) (on 2 of 6 straw sips; OK with cup)    Nectar Thick Nectar Thick Liquid: Not tested   Honey Thick Honey Thick Liquid: Not tested   Puree Puree: Within functional limits Presentation: Self Fed;Spoon   Solid   GO    Solid: Within functional limits Presentation: Self Fed       Nour Rodrigues K, MA, CCC-SLP 02/25/2013,9:28 AM

## 2013-02-25 NOTE — Progress Notes (Signed)
Patricia Black ZOX:[id number] DOB: 1951-02-25 DOA: 02/24/2013 PCP: No primary provider on file.  Brief narrative: 62 year old female known history diabetes migraines admitted for left arm weak in his left leg weakness presented to the emergency room labs 11/26 with the symptoms-last no normal 11/25 11:30 p.m.-NIHSS = 7 on admission outside window TPA found to have dense right middle cerebral artery thrombosis on CT scan.  Past medical history-As per Problem list Chart reviewed as below- None  Consultants:  Neurology  Procedures:  CT head-further workup pending  Antibiotics:  None   Subjective  Doing well. Left hand deficit still persist but left lower extremity left upper extremity much stronger than prior. Verbalizing well Speech therapy is clear patient for diet Family in room    Objective    Interim History: None  Telemetry: First degree AV block  Objective: Filed Vitals:   02/25/13 0355 02/25/13 0537 02/25/13 0807 02/25/13 1000  BP: 132/50 117/47 118/45 118/43  Pulse: 75 75 75 70  Temp: 98.6 F (37 C) 98.3 F (36.8 C) 97.7 F (36.5 C) 98.3 F (36.8 C)  TempSrc: Oral Oral Oral Oral  Resp: 18 18 18 18   Height:      Weight:      SpO2: 96% 94% 97% 98%    Intake/Output Summary (Last 24 hours) at 02/25/13 1406 Last data filed at 02/25/13 1200  Gross per 24 hour  Intake    120 ml  Output    900 ml  Net   -780 ml    Exam:  General: EOMI, distinct of facial droop on the right side Cardiovascular: S1-S2 no murmur rub or gallop Respiratory: Clinically clear Abdomen: Soft nontender nondistended Skin no lower extremity edema Neuro power 4/5 upper extremity but rapidly improving, fine motor dexterity is lacking in the left hand.  Data Reviewed: Basic Metabolic Panel:  Recent Labs Lab 02/24/13 1429 02/24/13 1513 02/25/13 0803  NA 140 141 137  K 3.7 3.4* 3.7  CL 106 107 102  CO2 21  --  20  GLUCOSE 131* 134* 247*  BUN 19 19 17   CREATININE  0.80 1.00 0.86  CALCIUM 9.3  --  8.4   Liver Function Tests:  Recent Labs Lab 02/24/13 1429 02/25/13 0803  AST 18 21  ALT 15 13  ALKPHOS 139* 120*  BILITOT 0.5 0.5  PROT 7.1 6.1  ALBUMIN 3.5 3.0*   No results found for this basename: LIPASE, AMYLASE,  in the last 168 hours No results found for this basename: AMMONIA,  in the last 168 hours CBC:  Recent Labs Lab 02/24/13 1429 02/24/13 1513 02/25/13 0803  WBC 12.6*  --  8.8  NEUTROABS 10.2*  --  6.3  HGB 13.5 13.6 11.4*  HCT 39.2 40.0 33.5*  MCV 85.2  --  85.0  PLT 241  --  205   Cardiac Enzymes:  Recent Labs Lab 02/24/13 1429  CKTOTAL 248*  TROPONINI <0.30   BNP: No components found with this basename: POCBNP,  CBG:  Recent Labs Lab 02/24/13 1451 02/24/13 2209 02/25/13 0030 02/25/13 0349 02/25/13 0813  GLUCAP 115* 214* 211* 220* 236*    No results found for this or any previous visit (from the past 240 hour(s)).   Studies:              All Imaging reviewed and is as per above notation   Scheduled Meds: . aspirin  300 mg Rectal Daily   Or  . aspirin  325 mg Oral Daily  .  heparin  5,000 Units Subcutaneous Q8H  .  HYDROmorphone (DILAUDID) injection  1 mg Intravenous Once  . insulin aspart  0-9 Units Subcutaneous Q4H  . midazolam  4 mg Intravenous Once  . [START ON 02/26/2013] pneumococcal 23 valent vaccine  0.5 mL Intramuscular Tomorrow-1000   Continuous Infusions: . sodium chloride 100 mL/hr at 02/25/13 0437     Assessment/Plan: 1. Right basal ganglia infarct-workup pending still. Appreciate neurology input. Needs 325 aspirin 2. Hyperlipidemia-patient needs high intensity statin-started on Lipitor 80 mg daily 3. Restless leg syndrome-patient's has tried meds in the past-at this stage we'll not start anything differently 4. Diabetes mellitus-blood sugars 220-236, eating 75% of her meals. 5. Dysphagia-recommended to be on regular diet with no straw 6. History migraines-stable 7. Alkaline  phosphatase 120-unclear etiology. Monitor as an outpatient periodically 8. Normocytic/borderline microcytic anemia-monitor.  Code Status: Full code Family Communication: Discussed in detail with family at bedside Disposition Plan: Pending PT OT input-might need CIR vs. home health   Pleas Koch, MD  [facility] Pager [phone] 02/25/2013, 2:06 PM    LOS: 1 day

## 2013-02-25 NOTE — Progress Notes (Signed)
Patient chocked after sipping water using a straw. Pt said 'its still numb in the area but painful'. Pt failed swallow screen and kept NPO. Bedside swallow has been ordered

## 2013-02-25 NOTE — Progress Notes (Signed)
RE: IMPACT STUDY  Discussed with research team, Wes Harbinson. MRI is ordered for patient. With the implantation of sphenopalatine ganglion device in this patient, the patient may have MRI scan. Not a contraindication for MRI. Discussed with Dr. Anne Hahn and nursing staff.  Gwendolyn Lima. Manson Passey, [facility], MBA, MHA [facility] Pager: [phone] 02/25/2013 1:45 PM

## 2013-02-25 NOTE — Progress Notes (Signed)
  Echocardiogram 2D Echocardiogram has been performed.  Cathie Beams 02/25/2013, 11:47 AM

## 2013-02-25 NOTE — Progress Notes (Signed)
Stroke Team Progress Note  HISTORY  Patricia Black is a 62 y.o. female with a history of diabetes and migraine who presents with left-sided weakness that was present on awakening. She states that she was normal when she went to bed last night, but when she awoke at 5 AM she was dizzy, and was dropping things from her left hand. She went out to her car, but fell on her way and was unable to get back up. When her daughter had not heard from her by mid morning she called the [facility] who responded and found her next to her car and called EMS. NIHSS: 7. tPA was not given due to outside of window .   SUBJECTIVE Her son and daughter are at the bedside.  Overall she feels her condition is gradually improving. Her left side weakness improved. She has mild right shoulder pain which has improved, currently 2/10 in severity. She passed SLP.   OBJECTIVE Most recent Vital Signs: Filed Vitals:   02/25/13 0355 02/25/13 0537 02/25/13 0807 02/25/13 1000  BP: 132/50 117/47 118/45 118/43  Pulse: 75 75 75 70  Temp: 98.6 F (37 C) 98.3 F (36.8 C) 97.7 F (36.5 C) 98.3 F (36.8 C)  TempSrc: Oral Oral Oral Oral  Resp: 18 18 18 18   Height:      Weight:      SpO2: 96% 94% 97% 98%   CBG (last 3)   Recent Labs  02/25/13 0030 02/25/13 0349 02/25/13 0813  GLUCAP 211* 220* 236*    IV Fluid Intake:   . sodium chloride 100 mL/hr at 02/25/13 0437    MEDICATIONS  . aspirin  300 mg Rectal Daily   Or  . aspirin  325 mg Oral Daily  . heparin  5,000 Units Subcutaneous Q8H  .  HYDROmorphone (DILAUDID) injection  1 mg Intravenous Once  . insulin aspart  0-9 Units Subcutaneous Q4H  . midazolam  4 mg Intravenous Once  . [START ON 02/26/2013] pneumococcal 23 valent vaccine  0.5 mL Intramuscular Tomorrow-1000   PRN:  acetaminophen, acetaminophen, lidocaine  Diet:  Carb Control  Activity:  Bedrest DVT Prophylaxis: Heparin Sq  CLINICALLY SIGNIFICANT STUDIES Basic Metabolic Panel:  Recent  Labs Lab 02/24/13 1429 02/24/13 1513 02/25/13 0803  NA 140 141 137  K 3.7 3.4* 3.7  CL 106 107 102  CO2 21  --  20  GLUCOSE 131* 134* 247*  BUN 19 19 17   CREATININE 0.80 1.00 0.86  CALCIUM 9.3  --  8.4   Liver Function Tests:  Recent Labs Lab 02/24/13 1429 02/25/13 0803  AST 18 21  ALT 15 13  ALKPHOS 139* 120*  BILITOT 0.5 0.5  PROT 7.1 6.1  ALBUMIN 3.5 3.0*   CBC:  Recent Labs Lab 02/24/13 1429 02/24/13 1513 02/25/13 0803  WBC 12.6*  --  8.8  NEUTROABS 10.2*  --  6.3  HGB 13.5 13.6 11.4*  HCT 39.2 40.0 33.5*  MCV 85.2  --  85.0  PLT 241  --  205   Coagulation:  Recent Labs Lab 02/24/13 1429  LABPROT 13.3  INR 1.03   Cardiac Enzymes:  Recent Labs Lab 02/24/13 1429  CKTOTAL 248*  TROPONINI <0.30   Urinalysis:  Recent Labs Lab 02/24/13 2307  COLORURINE YELLOW  LABSPEC 1.022  PHURINE 5.5  GLUCOSEU NEGATIVE  HGBUR NEGATIVE  BILIRUBINUR NEGATIVE  KETONESUR 40*  PROTEINUR NEGATIVE  UROBILINOGEN 0.2  NITRITE NEGATIVE  LEUKOCYTESUR NEGATIVE   Lipid Panel  Component Value Date/Time   CHOL 118 02/25/2013 0536   TRIG 108 02/25/2013 0536   HDL 35* 02/25/2013 0536   CHOLHDL 3.4 02/25/2013 0536   VLDL 22 02/25/2013 0536   LDLCALC 61 02/25/2013 0536   HgbA1C  No results found for this basename: HGBA1C    Urine Drug Screen:     Component Value Date/Time   LABOPIA POSITIVE* 02/24/2013 2307   COCAINSCRNUR NONE DETECTED 02/24/2013 2307   LABBENZ POSITIVE* 02/24/2013 2307   AMPHETMU NONE DETECTED 02/24/2013 2307   THCU NONE DETECTED 02/24/2013 2307   LABBARB NONE DETECTED 02/24/2013 2307    Alcohol Level:  Recent Labs Lab 02/24/13 1429  ETH <11    Dg Chest 2 View  02/24/2013   CLINICAL DATA:  CVA, baseline.  EXAM: CHEST  2 VIEW  COMPARISON:  None.  FINDINGS: Trachea is midline. Heart size accentuated by AP technique. Biapical pleural thickening. Lungs are otherwise clear. No pleural fluid.  IMPRESSION: No acute findings.    Electronically Signed   By: Leanna Battles M.D.   On: 02/24/2013 20:49   Ct Head Wo Contrast  02/24/2013   CLINICAL DATA:  Slurred speech found unresponsive  EXAM: CT HEAD WITHOUT CONTRAST  TECHNIQUE: Contiguous axial images were obtained from the base of the skull through the vertex without intravenous contrast.  COMPARISON:  None.  FINDINGS: The bony calvarium is intact. No gross soft tissue abnormality is noted. There is density identified within the right middle cerebral artery which may represent acute thrombosis. No acute hemorrhage is seen. The lateral most aspect of the basal ganglia on the right there is some generalized decreased attenuation identified best seen on image number 16 of series 3. This is suspicious for early ischemic change. No other focal abnormality is noted.  IMPRESSION: Dense right middle cerebral artery as described.  Area of vague decreased attenuation in the right basal ganglia laterally suspicious for acute ischemia.  These results were called by telephone at the time of interpretation on 02/24/2013 at 2:56 PM to the attending emergency room physician, who verbally acknowledged these results.   Electronically Signed   By: Alcide Clever M.D.   On: 02/24/2013 14:57   Ct Maxillofacial Wo Cm  02/25/2013   CLINICAL DATA:  Slurred speech. Stroke. Pre-procedure planning for impact study.  EXAM: CT MAXILLOFACIAL WITHOUT CONTRAST  TECHNIQUE: Multidetector CT imaging of the maxillofacial structures was performed. Multiplanar CT image reconstructions were also generated. A small metallic BB was placed on the right temple in order to reliably differentiate right from left.  COMPARISON:  CT head from the same day.  FINDINGS: A hyperdense right MCA is again noted. The right lentiform nucleus and answered cortex are hypodense.  The sphenopalatine ganglion is identified with relatively normal anatomy. The paranasal sinuses and mastoid air cells are clear. The globes and orbits are intact.   IMPRESSION: 1. The preprocedure and detail in of anatomy for identification of the sphenopalatine ganglion. 2. Hyperdense right MCA with hypo attenuation in the right lentiform nucleus and insular cortex compatible with a right MCA territory infarct.   Electronically Signed   By: Gennette Pac M.D.   On: 02/25/2013 08:42   Therapy Recommendations ASA 325 mg daily  Physical Exam:   Filed Vitals:   02/25/13 0355 02/25/13 0537 02/25/13 0807 02/25/13 1000  BP: 132/50 117/47 118/45 118/43  Pulse: 75 75 75 70  Temp: 98.6 F (37 C) 98.3 F (36.8 C) 97.7 F (36.5 C) 98.3 F (36.8 C)  TempSrc: Oral Oral Oral Oral  Resp: 18 18 18 18   Height:      Weight:      SpO2: 96% 94% 97% 98%    General: Not in acute distress HEENT: PERRL, EOMI, no scleral icterus, No JVD or bruit Cardiac: S1/S2, bradycardia, RRR, No murmurs, gallops or rubs Pulm: Good air movement bilaterally. Clear to auscultation bilaterally. No rales, wheezing, rhonchi or rubs. Abd: Soft, nondistended, nontender, no rebound pain, no organomegaly, BS present Ext: No edema. 2+DP/PT pulse bilaterally Musculoskeletal: No joint deformities, erythema, or stiffness, ROM full Skin: No rashes.  Psych: Patient is not psychotic, no suicidal or hemocidal ideation.  Mental Status: Patient is awake, alert, oriented to person, place, month, year, and situation. Immediate and remote memory are intact. Patient is able to give a clear and coherent history. No signs of aphasia or neglect.   Cranial Nerves: II: Visual Fields are full. Pupils are equal, round, and reactive to light.  Discs are difficult to visualize. III,IV, VI: EOMI without ptosis or diploplia.   V: Facial sensation is symmetric to temperature VII: Facial movement is decreased on the left VIII: hearing is intact to voice X: Uvula elevates symmetrically XI: Shoulder shrug is symmetric. XII: tongue is midline without atrophy or fasciculations.   Motor: Tone is normal. Bulk  is normal. 5/5 strength was present in right arm and leg. She has 3/5 weakness of the left arm and 4/5 of left leg Sensory: Sensation is symmetric to pin in the arms and legs. Deep Tendon Reflexes: 2+ and symmetric in the biceps and patellae.   Cerebellar: FNF and HKS are intact on right, difficult to commensurate with weakness on left Gait: Unable to test due to weakness  ASSESSMENT Ms. Jonne Rote is a 62 y.o. female presenting with left side weakness. tPA was not given due to outside of window. CT of the brain shpwed dense right middle cerebral artery and area of vague decreased attenuation in the right basal ganglia laterally suspicious for acute ischemia. Not on anticoagulation prior to admission. Now on aspirin 325 mg orally every day for secondary stroke prevention. Patient with resultant left side weakness. Work up underway.   CT of the brain:  Dense right middle cerebral artery as described.  Area of vague decreased attenuation in the right basal ganglia laterally suspicious for acute ischemia.  MRI of the brain: pending   MRA of the brain: pending  2D Echocardiogram: pending    Carotid Doppler: pending   CXR: No acute findings.  A1c pending  LDL: 61  EKG: sinus rhythm, normal axis, normal R wave progression, T wave flattening in aVL and V3, no obvious ischemia change.   Hospital day # 1  TREATMENT/PLAN  Continue aspirin 325 mg orally every day for secondary stroke prevention.  MRI of the brain: pending   MRA of the brain: pending  2D Echocardiogram: pending    Carotid Doppler: pending   CXR: No acute findings.   A1c pending    Lorretta Harp, MD PGY3, Internal Medicine Teaching Service Pager: [phone]   I have personally obtained a history, examined the patient, evaluated imaging results, and formulated the assessment and plan of care. I agree with the above.  Lesly Dukes

## 2013-02-26 ENCOUNTER — Inpatient Hospital Stay (HOSPITAL_COMMUNITY): Payer: Managed Care, Other (non HMO)

## 2013-02-26 DIAGNOSIS — I6330 Cerebral infarction due to thrombosis of unspecified cerebral artery: Secondary | ICD-10-CM

## 2013-02-26 DIAGNOSIS — G8110 Spastic hemiplegia affecting unspecified side: Secondary | ICD-10-CM

## 2013-02-26 LAB — GLUCOSE, CAPILLARY
Glucose-Capillary: 302 mg/dL — ABNORMAL HIGH (ref 70–99)
Glucose-Capillary: 266 mg/dL — ABNORMAL HIGH (ref 70–99)

## 2013-02-26 MED ORDER — PRAMIPEXOLE DIHYDROCHLORIDE 0.125 MG PO TABS
0.1250 mg | ORAL_TABLET | Freq: Two times a day (BID) | ORAL | Status: DC
Start: 2013-02-26 — End: 2013-03-03
  Administered 2013-02-26 – 2013-03-03 (×11): 0.125 mg via ORAL
  Filled 2013-02-26 (×12): qty 1

## 2013-02-26 MED ORDER — LORAZEPAM 2 MG/ML IJ SOLN
0.5000 mg | Freq: Once | INTRAMUSCULAR | Status: AC
  Administered 2013-02-26: 17:00:00 via INTRAVENOUS
  Filled 2013-02-26: qty 1

## 2013-02-26 NOTE — Consult Note (Signed)
Physical Medicine and Rehabilitation Consult Reason for Consult: CVA Referring Physician: Dr. Anne Hahn   HPI: Patricia Black is a 62 y.o. right handed female history of migraine headaches as well as diabetes mellitus and peripheral neuropathy who was admitted 02/24/2013 with left-sided weakness and dizziness as well as fall on her back porch and found by her family. Patient was independent active and working full-time prior to admission. 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 MRI MRA brain pending. Echocardiogram with ejection fraction 70% grade 2 diastolic dysfunction. Patient did not receive TPA. Neurology services consulted placed on aspirin for CVA prophylaxis as well as subcutaneous heparin for DVT prophylaxis. Physical therapy evaluation completed 02/26/2013 recommendations of physical medicine rehabilitation consult  Family is able to provide 24 7 supervision and assistance post discharge however mainly lives in the [place] area.  Review of Systems  Gastrointestinal: Positive for constipation.  Musculoskeletal: Positive for myalgias.  Neurological: Positive for headaches.  All other systems reviewed and are negative.   Past Medical History  Diagnosis Date  . Diabetes mellitus without complication   . Migraine    Past Surgical History  Procedure Laterality Date  . Abdominal hysterectomy     History reviewed. No pertinent family history. Social History:  reports that she has never smoked. She does not have any smokeless tobacco history on file. She reports that she does not drink alcohol or use illicit drugs. Allergies:  Allergies  Allergen Reactions  . Penicillins Other (See Comments)    Unknown reaction   Medications Prior to Admission  Medication Sig Dispense Refill  . atorvastatin (LIPITOR) 80 MG tablet Take 80 mg by mouth daily at 6 PM.      . fluticasone (FLONASE) 50 MCG/ACT nasal spray Place 1 spray into both nostrils daily as  needed for allergies or rhinitis.      . Fluticasone-Salmeterol (ADVAIR) 100-50 MCG/DOSE AEPB Inhale 1 puff into the lungs 2 (two) times daily.      . hydrochlorothiazide (HYDRODIURIL) 25 MG tablet Take 25 mg by mouth daily.      . insulin lispro (HUMALOG) 100 UNIT/ML injection Inject into the skin 3 (three) times daily before meals. Sliding scale      . propranolol ER (INDERAL LA) 160 MG SR capsule Take 160 mg by mouth daily.      . Topiramate ER (TROKENDI XR) 100 MG CP24 Take 100 mg by mouth daily.      . Topiramate ER (TROKENDI XR) 50 MG CP24 Take 50 mg by mouth daily.        Home: Home Living Family/patient expects to be discharged to:: Inpatient rehab Living Arrangements: Alone Available Help at Discharge: Family Type of Home: House Home Access: Stairs to enter Secretary/administrator of Steps: 4 Home Layout: Two level Home Equipment: None Additional Comments: [place]; is there an Inpt Rehab Center near family where pt could go?  Lives With: Alone  Functional History:   Functional Status:  Mobility: Bed Mobility Bed Mobility: Supine to Sit Supine to Sit: 2: Max assist;HOB elevated Transfers Transfers: Sit to Stand;Stand to Sit;Stand Pivot Transfers Sit to Stand: 3: Mod assist;From bed Stand to Sit: 3: Mod assist;To chair/3-in-1 Stand Pivot Transfers: 3: Mod assist;With armrests Ambulation/Gait Ambulation/Gait Assistance: Not tested (comment)    ADL:    Cognition: Cognition Overall Cognitive Status: Within Functional Limits for tasks assessed Arousal/Alertness: Awake/alert Orientation Level: Oriented X4 Attention: Selective;Alternating Selective Attention: Appears intact Alternating Attention: Appears intact Memory:  Appears intact Awareness: Appears intact Problem Solving: Appears intact Executive Function: Sequencing;Reasoning;Organizing Reasoning: Appears intact Sequencing: Appears intact Organizing: Appears intact Safety/Judgment: Appears  intact Cognition Arousal/Alertness: Awake/alert Behavior During Therapy: WFL for tasks assessed/performed Overall Cognitive Status: Within Functional Limits for tasks assessed  Blood pressure 128/42, pulse 63, temperature 97.5 F (36.4 C), temperature source Oral, resp. rate 18, height 5\' 7"  (1.702 m), weight 121.11 kg (267 lb), SpO2 96.00%. Physical Exam  Vitals reviewed. Constitutional: She is oriented to person, place, and time. She appears well-developed.  Eyes:  Pupils reactive to light  Neck: Normal range of motion. Neck supple. No thyromegaly present.  Cardiovascular: Normal rate and regular rhythm.   Respiratory: Effort normal and breath sounds normal. No respiratory distress.  GI: Soft. Bowel sounds are normal. She exhibits no distension.  Neurological: She is alert and oriented to person, place, and time.  Left facial droop field cut  Skin: Skin is warm and dry.   affect is flat, appears tired Motor strength is 5/5 in the right deltoid, bicep, tricep, grip 5/5 in the right hip flexor knee extensor ankle dorsiflexor and plantar flexion 0/5 left deltoid, 2 minus left bicep, 3 minus left bicep, 2 minus left finger flexors 2 minus left hip flexor, 3 minus left knee extensor, 1/5 in the left ankle dorsiflexor and plantar flexor 2 beats clonus left ankle Sensory reduced to light touch in the left upper extremity Intact to light touch in the left lower extremity as well as on the right side.  Results for orders placed during the hospital encounter of 02/24/13 (from the past 24 hour(s))  GLUCOSE, CAPILLARY     Status: Abnormal   Collection Time    02/25/13  1:20 PM      Result Value Range   Glucose-Capillary 288 (*) 70 - 99 mg/dL  GLUCOSE, CAPILLARY     Status: Abnormal   Collection Time    02/25/13  4:21 PM      Result Value Range   Glucose-Capillary 267 (*) 70 - 99 mg/dL  GLUCOSE, CAPILLARY     Status: Abnormal   Collection Time    02/25/13  9:09 PM      Result Value  Range   Glucose-Capillary 220 (*) 70 - 99 mg/dL   Comment 1 Documented in Chart     Comment 2 Notify RN    GLUCOSE, CAPILLARY     Status: Abnormal   Collection Time    02/26/13  6:35 AM      Result Value Range   Glucose-Capillary 302 (*) 70 - 99 mg/dL   Comment 1 Documented in Chart     Comment 2 Notify RN    GLUCOSE, CAPILLARY     Status: Abnormal   Collection Time    02/26/13 11:35 AM      Result Value Range   Glucose-Capillary 266 (*) 70 - 99 mg/dL   Dg Chest 2 View  16/12/9602   CLINICAL DATA:  CVA, baseline.  EXAM: CHEST  2 VIEW  COMPARISON:  None.  FINDINGS: Trachea is midline. Heart size accentuated by AP technique. Biapical pleural thickening. Lungs are otherwise clear. No pleural fluid.  IMPRESSION: No acute findings.   Electronically Signed   By: Leanna Battles M.D.   On: 02/24/2013 20:49   Ct Head Wo Contrast  02/24/2013   CLINICAL DATA:  Slurred speech found unresponsive  EXAM: CT HEAD WITHOUT CONTRAST  TECHNIQUE: Contiguous axial images were obtained from the base of the skull through the  vertex without intravenous contrast.  COMPARISON:  None.  FINDINGS: The bony calvarium is intact. No gross soft tissue abnormality is noted. There is density identified within the right middle cerebral artery which may represent acute thrombosis. No acute hemorrhage is seen. The lateral most aspect of the basal ganglia on the right there is some generalized decreased attenuation identified best seen on image number 16 of series 3. This is suspicious for early ischemic change. No other focal abnormality is noted.  IMPRESSION: Dense right middle cerebral artery as described.  Area of vague decreased attenuation in the right basal ganglia laterally suspicious for acute ischemia.  These results were called by telephone at the time of interpretation on 02/24/2013 at 2:56 PM to the attending emergency room physician, who verbally acknowledged these results.   Electronically Signed   By: Alcide Clever  M.D.   On: 02/24/2013 14:57   Ct Maxillofacial Wo Cm  02/25/2013   CLINICAL DATA:  Slurred speech. Stroke. Pre-procedure planning for impact study.  EXAM: CT MAXILLOFACIAL WITHOUT CONTRAST  TECHNIQUE: Multidetector CT imaging of the maxillofacial structures was performed. Multiplanar CT image reconstructions were also generated. A small metallic BB was placed on the right temple in order to reliably differentiate right from left.  COMPARISON:  CT head from the same day.  FINDINGS: A hyperdense right MCA is again noted. The right lentiform nucleus and answered cortex are hypodense.  The sphenopalatine ganglion is identified with relatively normal anatomy. The paranasal sinuses and mastoid air cells are clear. The globes and orbits are intact.  IMPRESSION: 1. The preprocedure and detail in of anatomy for identification of the sphenopalatine ganglion. 2. Hyperdense right MCA with hypo attenuation in the right lentiform nucleus and insular cortex compatible with a right MCA territory infarct.   Electronically Signed   By: Gennette Pac M.D.   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 She also has left neglect 1. Does the need for close, 24 hr/day medical supervision in concert with the patient's rehab needs make it unreasonable for this patient to be served in a less intensive setting? Yes 2. Co-Morbidities requiring supervision/potential complications: Dysphagia, morbid obesity (273lb) 3. Due to bladder management, bowel management, safety, skin/wound care, disease management, medication administration, pain management and patient education, does the patient require 24 hr/day rehab nursing? Yes 4. Does the patient require coordinated care of a physician, rehab nurse, PT (1-2 hrs/day, 5 days/week), OT (1-2 hrs/day, 5 days/week) and SLP (0.5-1 hrs/day, 5 days/week) to address physical and functional deficits in the context of  the above medical diagnosis(es)? Yes Addressing deficits in the following areas: balance, endurance, locomotion, strength, transferring, bowel/bladder control, bathing, dressing, feeding, grooming, toileting and cognition 5. Can the patient actively participate in an intensive therapy program of at least 3 hrs of therapy per day at least 5 days per week? Yes 6. The potential for patient to make measurable gains while on inpatient rehab is excellent 7. Anticipated functional outcomes upon discharge from inpatient rehab are supervision to min assist mobility with PT, supervision to min assist ADLs with OT, medication management, safety per os intake with SLP. 8. Estimated rehab length of stay to reach the above functional goals is: 3 weeks 9. Does the patient have adequate social supports to accommodate these discharge functional goals? Yes 10. Anticipated D/C setting: Home 11. Anticipated post D/C treatments: HH therapy 12. Overall Rehab/Functional Prognosis: excellent  RECOMMENDATIONS: This patient's condition  is appropriate for continued rehabilitative care in the following setting: CIR Patient has agreed to participate in recommended program. Potentially Note that insurance prior authorization may be required for reimbursement for recommended care.  Comment: Family is looking for a facility in the [place] area.  02/26/2013

## 2013-02-26 NOTE — Progress Notes (Signed)
Speech Language Pathology Treatment: Dysphagia  Patient Details Name: Patricia Black MRN: [id number] DOB: 07-29-1950 Today's Date: 02/26/2013 Time: 4098-1191 SLP Time Calculation (min): 25 min  Assessment / Plan / Recommendation Clinical Impression  Pt observed directly with breakfast. No overt s/s of aspiration noted today. Re-attempted straw sips- no cough or throat clear today- improvement from yesterday. Pt did require multiple swallows with solids and mildly prolonged oral phase with solids. Will update recommendations on room sign to allow straws. Pt cued to take small bites and sips but cueing was minimal. Continue meds whole in puree for now. Will continue to follow for diet tolerance.     HPI HPI: Patricia Black is a 62 y.o. female with a history of diabetes and migraine who presents with left-sided weakness that was present on awakening. She states that she was normal when she went to bed last night, but when she awoke at 5 AM she was dizzy, and was dropping things from her left hand. She went out to her car, but fell on her way and was unable to get back up. When her daughter had not heard from her by mid morning she called the [facility] Department who responded and found her next to her car and called EMS. Pt failed stroke swallow screen on straw sip.   Pertinent Vitals n/a  SLP Plan  Continue with current plan of care    Recommendations Diet recommendations: Regular;Thin liquid (straws now ok) Liquids provided via: Cup;Straw Medication Administration: Whole meds with puree Supervision: Intermittent supervision to cue for compensatory strategies Compensations: Slow rate;Small sips/bites Postural Changes and/or Swallow Maneuvers: Seated upright 90 degrees              Oral Care Recommendations: Oral care BID Follow up Recommendations: Inpatient Rehab Plan: Continue with current plan of care    GO     Metro Kung, MA, CCC-SLP 02/26/2013, 9:17 AM

## 2013-02-26 NOTE — Progress Notes (Addendum)
There is no grip or effort against gravity in LUE when assessed this afternoon, while previous documentation shows that pt had only a slight drift and could grip (although weak).  NIH now a 7 from 4. MD paged, no new orders at this time.  Will monitor.

## 2013-02-26 NOTE — Progress Notes (Signed)
Rehab Admissions Coordinator Note:  Patient was screened by Trish Mage for appropriateness for an Inpatient Acute Rehab Consult.  At this time, an inpatient rehab consult has already been ordered and is pending completion.  Lelon Frohlich M 02/26/2013, 1:20 PM  I can be reached at [phone].

## 2013-02-26 NOTE — Progress Notes (Signed)
Stroke Team Progress Note  HISTORY  Patricia Black is a 62 y.o. female with a history of diabetes and migraine who presents with left-sided weakness that was present on awakening. She states that she was normal when she went to bed last night, but when she awoke at 5 AM she was dizzy, and was dropping things from her left hand. She went out to her car, but fell on her way and was unable to get back up. When her daughter had not heard from her by mid morning she called the [facility] Department who responded and found her next to her car and called EMS. NIHSS: 7. tPA was not given due to outside of window .   SUBJECTIVE Day #3 IMPACT. Slowly progressing, left hemiparesis.  OBJECTIVE Most recent Vital Signs: Filed Vitals:   02/25/13 1750 02/25/13 2126 02/26/13 0014 02/26/13 0521  BP: 120/31 113/37 123/49 114/50  Pulse: 63 65 51 73  Temp: 97.7 F (36.5 C) 97.6 F (36.4 C) 98 F (36.7 C) 97.8 F (36.6 C)  TempSrc: Oral Oral Oral Oral  Resp: 18 18 18 18   Height:      Weight:      SpO2: 98% 94% 95% 98%   CBG (last 3)   Recent Labs  02/25/13 1621 02/25/13 2109 02/26/13 0635  GLUCAP 267* 220* 302*    IV Fluid Intake:   . sodium chloride 100 mL/hr at 02/25/13 0437    MEDICATIONS  . aspirin  300 mg Rectal Daily   Or  . aspirin  325 mg Oral Daily  . atorvastatin  80 mg Oral q1800  . heparin  5,000 Units Subcutaneous Q8H  .  HYDROmorphone (DILAUDID) injection  1 mg Intravenous Once  . insulin aspart  0-5 Units Subcutaneous QHS  . insulin aspart  0-9 Units Subcutaneous TID WC  . midazolam  4 mg Intravenous Once  . pneumococcal 23 valent vaccine  0.5 mL Intramuscular Tomorrow-1000   PRN:  acetaminophen, acetaminophen, lidocaine  Diet:  Carb Control  Activity: Ambulated DVT Prophylaxis: Heparin Sq  CLINICALLY SIGNIFICANT STUDIES Basic Metabolic Panel:   Recent Labs Lab 02/24/13 1429 02/24/13 1513 02/25/13 0803  NA 140 141 137  K 3.7 3.4* 3.7  CL 106 107 102  CO2  21  --  20  GLUCOSE 131* 134* 247*  BUN 19 19 17   CREATININE 0.80 1.00 0.86  CALCIUM 9.3  --  8.4   Liver Function Tests:   Recent Labs Lab 02/24/13 1429 02/25/13 0803  AST 18 21  ALT 15 13  ALKPHOS 139* 120*  BILITOT 0.5 0.5  PROT 7.1 6.1  ALBUMIN 3.5 3.0*   CBC:   Recent Labs Lab 02/24/13 1429 02/24/13 1513 02/25/13 0803  WBC 12.6*  --  8.8  NEUTROABS 10.2*  --  6.3  HGB 13.5 13.6 11.4*  HCT 39.2 40.0 33.5*  MCV 85.2  --  85.0  PLT 241  --  205   Coagulation:   Recent Labs Lab 02/24/13 1429  LABPROT 13.3  INR 1.03   Cardiac Enzymes:   Recent Labs Lab 02/24/13 1429  CKTOTAL 248*  TROPONINI <0.30   Urinalysis:   Recent Labs Lab 02/24/13 2307  COLORURINE YELLOW  LABSPEC 1.022  PHURINE 5.5  GLUCOSEU NEGATIVE  HGBUR NEGATIVE  BILIRUBINUR NEGATIVE  KETONESUR 40*  PROTEINUR NEGATIVE  UROBILINOGEN 0.2  NITRITE NEGATIVE  LEUKOCYTESUR NEGATIVE   Lipid Panel    Component Value Date/Time   CHOL 118 02/25/2013 0536   TRIG  108 02/25/2013 0536   HDL 35* 02/25/2013 0536   CHOLHDL 3.4 02/25/2013 0536   VLDL 22 02/25/2013 0536   LDLCALC 61 02/25/2013 0536   HgbA1C  Lab Results  Component Value Date   HGBA1C 7.2* 02/25/2013    Urine Drug Screen:     Component Value Date/Time   LABOPIA POSITIVE* 02/24/2013 2307   COCAINSCRNUR NONE DETECTED 02/24/2013 2307   LABBENZ POSITIVE* 02/24/2013 2307   AMPHETMU NONE DETECTED 02/24/2013 2307   THCU NONE DETECTED 02/24/2013 2307   LABBARB NONE DETECTED 02/24/2013 2307    Alcohol Level:   Recent Labs Lab 02/24/13 1429  ETH <11    Dg Chest 2 View 02/24/2013   CLINICAL DATA:  CVA, baseline.  EXAM: CHEST  2 VIEW  COMPARISON:  None.  FINDINGS: Trachea is midline. Heart size accentuated by AP technique. Biapical pleural thickening. Lungs are otherwise clear. No pleural fluid.  IMPRESSION: No acute findings.   Electronically Signed   By: Leanna Battles M.D.   On: 02/24/2013 20:49   Ct Head Wo  Contrast 02/24/2013   CLINICAL DATA:  Slurred speech found unresponsive  EXAM: CT HEAD WITHOUT CONTRAST  TECHNIQUE: Contiguous axial images were obtained from the base of the skull through the vertex without intravenous contrast.  COMPARISON:  None.  FINDINGS: The bony calvarium is intact. No gross soft tissue abnormality is noted. There is density identified within the right middle cerebral artery which may represent acute thrombosis. No acute hemorrhage is seen. The lateral most aspect of the basal ganglia on the right there is some generalized decreased attenuation identified best seen on image number 16 of series 3. This is suspicious for early ischemic change. No other focal abnormality is noted.  IMPRESSION: Dense right middle cerebral artery as described.  Area of vague decreased attenuation in the right basal ganglia laterally suspicious for acute ischemia.  These results were called by telephone at the time of interpretation on 02/24/2013 at 2:56 PM to the attending emergency room physician, who verbally acknowledged these results.   Electronically Signed   By: Alcide Clever M.D.   On: 02/24/2013 14:57   Ct Maxillofacial Wo Cm 02/25/2013   CLINICAL DATA:  Slurred speech. Stroke. Pre-procedure planning for impact study.  EXAM: CT MAXILLOFACIAL WITHOUT CONTRAST  TECHNIQUE: Multidetector CT imaging of the maxillofacial structures was performed. Multiplanar CT image reconstructions were also generated. A small metallic BB was placed on the right temple in order to reliably differentiate right from left.  COMPARISON:  CT head from the same day.  FINDINGS: A hyperdense right MCA is again noted. The right lentiform nucleus and answered cortex are hypodense.  The sphenopalatine ganglion is identified with relatively normal anatomy. The paranasal sinuses and mastoid air cells are clear. The globes and orbits are intact.  IMPRESSION: 1. The preprocedure and detail in of anatomy for identification of the  sphenopalatine ganglion. 2. Hyperdense right MCA with hypo attenuation in the right lentiform nucleus and insular cortex compatible with a right MCA territory infarct.   Electronically Signed   By: Gennette Pac M.D.   On: 02/25/2013 08:42    MRI/A: ordered for 02/25/2013 not done  EKG: sinus rhythm, normal axis, normal R wave progression, T wave flattening in aVL and V3, no obvious ischemia change.   Therapy Recommendations   Physical Exam:   Filed Vitals:   02/25/13 1750 02/25/13 2126 02/26/13 0014 02/26/13 0521  BP: 120/31 113/37 123/49 114/50  Pulse: 63 65 51 73  Temp: 97.7 F (36.5 C) 97.6 F (36.4 C) 98 F (36.7 C) 97.8 F (36.6 C)  TempSrc: Oral Oral Oral Oral  Resp: 18 18 18 18   Height:      Weight:      SpO2: 98% 94% 95% 98%    General: Not in acute distress HEENT: PERRL, EOMI, no scleral icterus, No JVD or bruit Cardiac: S1/S2, bradycardia, RRR, No murmurs, gallops or rubs Pulm: Good air movement bilaterally. Clear to auscultation bilaterally. No rales, wheezing, rhonchi or rubs. Abd: Soft, nondistended, nontender, no rebound pain, no organomegaly, BS present Ext: No edema. 2+DP/PT pulse bilaterally Musculoskeletal: No joint deformities, erythema, or stiffness, ROM full Skin: No rashes.  Psych: Patient is not psychotic, no suicidal or hemocidal ideation.  Mental Status: Patient is awake, alert, oriented to person, place, month, year, and situation. Immediate and remote memory are intact. Patient is able to give a clear and coherent history. No signs of aphasia or neglect.   Cranial Nerves: II: Visual Fields are full. Pupils are equal, round, and reactive to light.  Discs are difficult to visualize. III,IV, VI: EOMI without ptosis or diploplia.   V: Facial sensation is symmetric to temperature VII: Facial movement is decreased on the left VIII: hearing is intact to voice X: Uvula elevates symmetrically XI: Shoulder shrug is symmetric. XII: tongue is  midline without atrophy or fasciculations.   Motor: Tone is normal. Bulk is normal. 5/5 strength was present in right arm and leg. She has 2/5 weakness of the left arm and 4-/5 of left leg Sensory: Sensation is symmetric to pin in the arms and legs. Deep Tendon Reflexes: 2+ and symmetric in the biceps and patellae.   Cerebellar: FNF and HKS are intact on right, difficult to commensurate with weakness on left Gait: Unable to test due to weakness  ASSESSMENT Ms. Patricia Black is a 61 y.o. female presenting with left side weakness. tPA was not given due to outside of window. CT of the brain showed dense right middle cerebral artery and area of vague decreased attenuation in the right basal ganglia laterally suspicious for acute ischemia. Not on anticoagulation prior to admission. Now on aspirin 325 mg orally every day for secondary stroke prevention. Patient with resultant left side weakness. Work up underway.  Patient was out of the window for acute intervention but did meet criteria for IMPACT STUDY.   Diabetes Mellitus, HGB A1C  7.2, goal < 6.5  Hyperlipidemia, LDL: 61, at goal < 70 in diabetics, on lipitor statin  Hospital day # 2  Weakness of the left arm and leg is worse today. Probable extension of the stroke. MRI of the brain and MRA is to be done today.   TREATMENT/PLAN  Continue aspirin 325 mg orally every day for secondary stroke prevention.  MRI/A of the brain  Day #3  IMPACT-24  Await therapy evaluations. Will suspect that patient will need CIR.  Gwendolyn Lima. Manson Passey, [facility], MBA, MHA [facility] Pager: [phone] 02/26/2013 7:52 AM  I have personally obtained a history, examined the patient, evaluated imaging results, and formulated the assessment and plan of care. I agree with the above. Lesly Dukes

## 2013-02-26 NOTE — Progress Notes (Signed)
Inpatient Diabetes Program Recommendations  AACE/ADA: New Consensus Statement on Inpatient Glycemic Control (2013)  Target Ranges:  Prepandial:   less than 140 mg/dL      Peak postprandial:   less than 180 mg/dL (1-2 hours)      Critically ill patients:  140 - 180 mg/dL   Reason for Visit: Results for Patricia Black, Patricia Black (MRN [id number]) as of 02/26/2013 12:54  Ref. Range 02/25/2013 13:20 02/25/2013 16:21 02/25/2013 21:09 02/26/2013 06:35 02/26/2013 11:35  Glucose-Capillary Latest Range: 70-99 mg/dL 865 (H) 784 (H) 696 (H) 302 (H) 266 (H)   Please consider adding basal insulin Lantus 24 units daily (0.2 units/kg).    Note that A1C indicates average CBG's around 156 mg/dL.  According to medication reconciliation, patient was only on Humalog with meals prior to admit.  Will follow.  Thanks, Beryl Meager, RN, BC-ADM Inpatient Diabetes Coordinator Pager [phone]

## 2013-02-26 NOTE — Evaluation (Signed)
Speech Language Pathology Evaluation Patient Details Name: Patricia Black MRN: [id number] DOB: 1950/07/17 Today's Date: 02/26/2013 Time: 4098-1191 SLP Time Calculation (min): 25 min  Problem List:  Patient Active Problem List   Diagnosis Date Noted  . CVA (cerebral infarction) 02/24/2013  . CVA (cerebral vascular accident) 02/24/2013  . Hypothermia 02/24/2013  . Hypokalemia 02/24/2013   Past Medical History:  Past Medical History  Diagnosis Date  . Diabetes mellitus without complication   . Migraine    Past Surgical History:  Past Surgical History  Procedure Laterality Date  . Abdominal hysterectomy     HPI:  Patricia Black is a 62 y.o. female with a history of diabetes and migraine who presents with left-sided weakness that was present on awakening. She states that she was normal when she went to bed last night, but when she awoke at 5 AM she was dizzy, and was dropping things from her left hand. She went out to her car, but fell on her way and was unable to get back up. When her daughter had not heard from her by mid morning she called the [facility] Department who responded and found her next to her car and called EMS. Pt failed stroke swallow screen on straw sip.   Assessment / Plan / Recommendation Clinical Impression  Pt's overall language and cognitive skills appear intact for tasks assessed- memory, problem solving, safety awareness, sequencing, reading. Pt appears aware of her difficulties with motor speech. She does demonstrate a mild dysarthria, likely due to L side weakness, but is still 80-100% intelligible. Discussed overarticulation/ volume/ speech rate strategies that would be helpful to produce clear speech. Pt would benefit from continued ST for improving motor speech so that pt is consistently intelligible. Rx that speech continue to follow for diet check and motor speech skills.    SLP Assessment  Patient needs continued Speech Lanaguage Pathology Services     Follow Up Recommendations  Inpatient Rehab    Frequency and Duration min 2x/week  2 weeks   Pertinent Vitals/Pain n/a   SLP Goals  SLP Goals Potential to Achieve Goals: Good Progress/Goals/Alternative treatment plan discussed with pt/caregiver and they: Agree SLP Goal #1: Pt will produce speech with 90-100% intelligibility in conversation with min cues. SLP Goal #1 - Progress: Progressing toward goal SLP Goal #2: Pt will recall and utilize strategies to increase intelligibility in at least 4 of 5 opportunities. SLP Goal #2 - Progress: Progressing toward goal  SLP Evaluation Prior Functioning  Cognitive/Linguistic Baseline: Within functional limits  Lives With: Alone   Cognition  Overall Cognitive Status: Within Functional Limits for tasks assessed Arousal/Alertness: Awake/alert Orientation Level: Oriented X4 Attention: Selective;Alternating Selective Attention: Appears intact Alternating Attention: Appears intact Memory: Appears intact Awareness: Appears intact Problem Solving: Appears intact Executive Function: Sequencing;Reasoning;Organizing Reasoning: Appears intact Sequencing: Appears intact Organizing: Appears intact Safety/Judgment: Appears intact    Comprehension  Auditory Comprehension Overall Auditory Comprehension: Appears within functional limits for tasks assessed Commands: Within Functional Limits Conversation: Complex Reading Comprehension Reading Status: Within funtional limits    Expression Expression Primary Mode of Expression: Verbal Verbal Expression Overall Verbal Expression: Appears within functional limits for tasks assessed Initiation: No impairment Level of Generative/Spontaneous Verbalization: Conversation Naming: No impairment Pragmatics: No impairment Written Expression Dominant Hand: Left (now using R due to difficulties now with L hand)   Oral / Motor Oral Motor/Sensory Function Overall Oral Motor/Sensory Function:  Impaired Labial ROM: Reduced left Labial Symmetry: Abnormal symmetry left Labial Strength: Within Functional Limits Labial Sensation:  Within Functional Limits Lingual ROM: Within Functional Limits Lingual Symmetry: Within Functional Limits Facial Sensation: Reduced (possibly reduced L- did not remove food stuck to chin) Motor Speech Overall Motor Speech: Impaired Phonation: Normal Resonance: Within functional limits Articulation: Impaired Level of Impairment: Conversation Intelligibility: Intelligible Motor Planning: Witnin functional limits Motor Speech Errors: Aware Effective Techniques: Slow rate;Over-articulate   GO     Metro Kung, MA, CCC-SLP 02/26/2013, 9:13 AM

## 2013-02-26 NOTE — Evaluation (Signed)
Physical Therapy Evaluation Patient Details Name: Patricia Black MRN: [id number] DOB: 1950/08/10 Today's Date: 02/26/2013 Time: 4098-1191 PT Time Calculation (min): 19 min  PT Assessment / Plan / Recommendation History of Present Illness  Patricia Black is a 62 y.o. female with a history of diabetes and migraine who presents with left-sided weakness that was present on awakening. She states that she was normal when she went to bed last night, but when she awoke at 5 AM she was dizzy, and was dropping things from her left hand. She went out to her car, but fell on her way and was unable to get back up. When her daughter had not heard from her by mid morning she called the [facility] Department who responded and found her next to her car and called EMS. NIHSS: 7. 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  Clinical Impression  Pt admitted with above. Pt currently with functional limitations due to the deficits listed below (see PT Problem List).  Pt will benefit from skilled PT to increase their independence and safety with mobility to allow discharge to the venue listed below.       PT Assessment  Patient needs continued PT services    Follow Up Recommendations  CIR Is there a CIR-type facility near where pt's family is in [place]?    Does the patient have the potential to tolerate intense rehabilitation      Barriers to Discharge Decreased caregiver support Pt's family is in [place]; is there a CIR-type facility near family where pt can rehab?    Equipment Recommendations   (TBD)    Recommendations for Other Services Rehab consult   Frequency Min 4X/week    Precautions / Restrictions Precautions Precautions: Fall   Pertinent Vitals/Pain no apparent distress       Mobility  Bed Mobility Bed Mobility: Supine to Sit Supine to Sit: 2: Max assist;HOB elevated Details for Bed Mobility Assistance: Cues for technique;  Inefficient movement and requiring assist to elevate trunk Transfers Transfers: Sit to Stand;Stand to Sit;Stand Pivot Transfers Sit to Stand: 3: Mod assist;From bed Stand to Sit: 3: Mod assist;To chair/3-in-1 Stand Pivot Transfers: 3: Mod assist;With armrests Details for Transfer Assistance: Cues for technique; support give to flaccid LUE and L knee blocke for safety; Pt stood with most weight on her RLE, and help to armrest of cahir next to her with RUE; quite unsteady and unable to stand without RUE support; Basic pivot to recliner locked on pt's R side with pt reaching for far armrest with RUE Ambulation/Gait Ambulation/Gait Assistance: Not tested (comment) Modified Rankin (Stroke Patients Only) Pre-Morbid Rankin Score: No symptoms Modified Rankin: Severe disability    Exercises     PT Diagnosis: Hemiplegia dominant side;Difficulty walking  PT Problem List: Decreased strength;Decreased range of motion;Decreased activity tolerance;Decreased balance;Decreased mobility;Decreased coordination;Decreased cognition;Decreased knowledge of use of DME;Impaired tone PT Treatment Interventions: DME instruction;Gait training;Functional mobility training;Therapeutic activities;Therapeutic exercise;Balance training;Neuromuscular re-education;Patient/family education     PT Goals(Current goals can be found in the care plan section) Acute Rehab PT Goals Patient Stated Goal: Get better PT Goal Formulation: With patient Time For Goal Achievement: 03/12/13 Potential to Achieve Goals: Good  Visit Information  Last PT Received On: 02/26/13 Assistance Needed: +2 (for progressive amb) History of Present Illness: Patricia Black is a 61 y.o. female with a history of diabetes and migraine who presents with left-sided weakness that was present on awakening. She  states that she was normal when she went to bed last night, but when she awoke at 5 AM she was dizzy, and was dropping things from her left hand. She  went out to her car, but fell on her way and was unable to get back up. When her daughter had not heard from her by mid morning she called the [facility] Department who responded and found her next to her car and called EMS. NIHSS: 7. 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       Prior Functioning  Home Living Family/patient expects to be discharged to:: Inpatient rehab Living Arrangements: Alone Additional Comments: Family is in [place]; is there an Inpt Rehab Center near family where pt could go?  Lives With: Alone Prior Function Level of Independence: Independent Communication Communication: No difficulties Dominant Hand: Left    Cognition  Cognition Arousal/Alertness: Awake/alert Behavior During Therapy: WFL for tasks assessed/performed Overall Cognitive Status: Within Functional Limits for tasks assessed    Extremity/Trunk Assessment Upper Extremity Assessment Upper Extremity Assessment: Defer to OT evaluation (No noted active movement LUE) Lower Extremity Assessment Lower Extremity Assessment: LLE deficits/detail LLE Deficits / Details: grossly 3/5 muscle strength knee flex/ext, ankle dorsiflexion; 2/5 hip flexion LLE Coordination: decreased gross motor   Balance Balance Balance Assessed: Yes Static Sitting Balance Static Sitting - Balance Support: Right upper extremity supported Static Sitting - Level of Assistance: 3: Mod assist;4: Min assist Static Sitting - Comment/# of Minutes: Mod assist progressing to min assist; requiring R UE support; tending to lose balance to L side until held with R UE to foot board  End of Session PT - End of Session Equipment Utilized During Treatment: Gait belt Activity Tolerance: Patient tolerated treatment well Patient left: in chair;with call bell/phone within reach Nurse Communication: Mobility status  GP     [facility] [place], [place]  [phone]  02/26/2013, 10:41 AM

## 2013-02-27 DIAGNOSIS — I6350 Cerebral infarction due to unspecified occlusion or stenosis of unspecified cerebral artery: Secondary | ICD-10-CM

## 2013-02-27 LAB — GLUCOSE, CAPILLARY
Glucose-Capillary: 269 mg/dL — ABNORMAL HIGH (ref 70–99)
Glucose-Capillary: 267 mg/dL — ABNORMAL HIGH (ref 70–99)

## 2013-02-27 NOTE — Progress Notes (Signed)
Stroke Team Progress Note  HISTORY  Patricia Black is a 62 y.o. female with a history of diabetes and migraines who presented with left-sided weakness that was present on awakening on 02/24/2013. She stated that she was normal when she went to bed the night before but when she awoke at 5 AM she was dizzy, and was dropping things from her left hand. She went out to her car, but fell on her way and was unable to get back up. When her daughter had not heard from her by mid morning she called the [facility] Department who responded and found her next to her car and called EMS. NIHSS: 7. tPA was not given due to outside of window .   SUBJECTIVE Day #4 IMPACT. No family members present this morning. The patient is without complaints. She was hoping her progress would be faster. We briefly discussed inpatient rehabilitation.   OBJECTIVE Most recent Vital Signs: Filed Vitals:   02/26/13 2119 02/27/13 0207 02/27/13 0550 02/27/13 0808  BP: 138/43 141/34 141/36 114/31  Pulse: 72 73 69 52  Temp: 98.4 F (36.9 C) 98.2 F (36.8 C) 97.7 F (36.5 C) 98 F (36.7 C)  TempSrc: Oral Oral Oral Oral  Resp: 16 18 18 18   Height:      Weight:      SpO2: 98% 99% 97% 100%   CBG (last 3)   Recent Labs  02/26/13 1630 02/26/13 2113 02/27/13 0652  GLUCAP 290* 282* 269*    IV Fluid Intake:   . sodium chloride 100 mL/hr at 02/25/13 0437    MEDICATIONS  . aspirin  300 mg Rectal Daily   Or  . aspirin  325 mg Oral Daily  . atorvastatin  80 mg Oral q1800  . heparin  5,000 Units Subcutaneous Q8H  .  HYDROmorphone (DILAUDID) injection  1 mg Intravenous Once  . insulin aspart  0-5 Units Subcutaneous QHS  . insulin aspart  0-9 Units Subcutaneous TID WC  . midazolam  4 mg Intravenous Once  . pneumococcal 23 valent vaccine  0.5 mL Intramuscular Tomorrow-1000  . pramipexole  0.125 mg Oral BID   PRN:  acetaminophen, acetaminophen, lidocaine  Diet:  Carb Control  Activity: Ambulated DVT Prophylaxis:  Heparin Sq  CLINICALLY SIGNIFICANT STUDIES Basic Metabolic Panel:   Recent Labs Lab 02/24/13 1429 02/24/13 1513 02/25/13 0803  NA 140 141 137  K 3.7 3.4* 3.7  CL 106 107 102  CO2 21  --  20  GLUCOSE 131* 134* 247*  BUN 19 19 17   CREATININE 0.80 1.00 0.86  CALCIUM 9.3  --  8.4   Liver Function Tests:   Recent Labs Lab 02/24/13 1429 02/25/13 0803  AST 18 21  ALT 15 13  ALKPHOS 139* 120*  BILITOT 0.5 0.5  PROT 7.1 6.1  ALBUMIN 3.5 3.0*   CBC:   Recent Labs Lab 02/24/13 1429 02/24/13 1513 02/25/13 0803  WBC 12.6*  --  8.8  NEUTROABS 10.2*  --  6.3  HGB 13.5 13.6 11.4*  HCT 39.2 40.0 33.5*  MCV 85.2  --  85.0  PLT 241  --  205   Coagulation:   Recent Labs Lab 02/24/13 1429  LABPROT 13.3  INR 1.03   Cardiac Enzymes:   Recent Labs Lab 02/24/13 1429  CKTOTAL 248*  TROPONINI <0.30   Urinalysis:   Recent Labs Lab 02/24/13 2307  COLORURINE YELLOW  LABSPEC 1.022  PHURINE 5.5  GLUCOSEU NEGATIVE  HGBUR NEGATIVE  BILIRUBINUR NEGATIVE  KETONESUR  40*  PROTEINUR NEGATIVE  UROBILINOGEN 0.2  NITRITE NEGATIVE  LEUKOCYTESUR NEGATIVE   Lipid Panel    Component Value Date/Time   CHOL 118 02/25/2013 0536   TRIG 108 02/25/2013 0536   HDL 35* 02/25/2013 0536   CHOLHDL 3.4 02/25/2013 0536   VLDL 22 02/25/2013 0536   LDLCALC 61 02/25/2013 0536   HgbA1C  Lab Results  Component Value Date   HGBA1C 7.2* 02/25/2013    Urine Drug Screen:     Component Value Date/Time   LABOPIA POSITIVE* 02/24/2013 2307   COCAINSCRNUR NONE DETECTED 02/24/2013 2307   LABBENZ POSITIVE* 02/24/2013 2307   AMPHETMU NONE DETECTED 02/24/2013 2307   THCU NONE DETECTED 02/24/2013 2307   LABBARB NONE DETECTED 02/24/2013 2307    Alcohol Level:   Recent Labs Lab 02/24/13 1429  ETH <11    Dg Chest 2 View 02/24/2013    No acute findings.     Ct Head Wo Contrast 02/24/2013    Dense right middle cerebral artery as described.  Area of vague decreased attenuation in  the right basal ganglia laterally suspicious for acute ischemia.    Ct Maxillofacial Wo Cm 02/25/2013    1. The preprocedure and detail in of anatomy for identification of the sphenopalatine ganglion. 2. Hyperdense right MCA with hypo attenuation in the right lentiform nucleus and insular cortex compatible with a right MCA territory infarct.     MRI Head 02/26/2013 1. MRI discontinued prior to completion due to patient condition.  2. Acute infarcts in the right basal ganglia and posterior right MCA territory. Associated edema without mass effect. No definite associated hemorrhage.  MRA Head 02/26/2013 Right MCA occluded just beyond its origin. Minimal if any reconstituted right MCA flow. Dominant appearing distal right vertebral artery. No normal distal left vertebral artery identified.  Doppler Carotid Dopplers completed.  Preliminary report: There is 1-39% ICA stenosis. Right vertebral artery flow is antegrade. Left vertebral artery flow not insonated   EKG: sinus rhythm, normal axis, normal R wave progression, T wave flattening in aVL and V3, no obvious ischemia change.   Therapy Recommendations - inpatient rehabilitation recommended   Physical Exam:   Filed Vitals:   02/26/13 2119 02/27/13 0207 02/27/13 0550 02/27/13 0808  BP: 138/43 141/34 141/36 114/31  Pulse: 72 73 69 52  Temp: 98.4 F (36.9 C) 98.2 F (36.8 C) 97.7 F (36.5 C) 98 F (36.7 C)  TempSrc: Oral Oral Oral Oral  Resp: 16 18 18 18   Height:      Weight:      SpO2: 98% 99% 97% 100%    General: Not in acute distress HEENT: PERRL, EOMI, no scleral icterus, No JVD or bruit Cardiac: S1/S2, RRR, No murmurs, gallops or rubs Pulm: Good air movement bilaterally. Clear to auscultation bilaterally. No rales, wheezing, rhonchi or rubs. Abd: Soft, nondistended, nontender, no rebound pain, no organomegaly, BS present Ext: No edema. 2+DP/PT pulse bilaterally Musculoskeletal: No joint deformities, erythema, or  stiffness, ROM full Skin: No rashes.  Psych: Patient is not psychotic, no suicidal or hemocidal ideation.  Mental Status: Patient is awake, alert, oriented to person, place, month, year, and situation. Immediate and remote memory are intact. Patient is able to give a clear and coherent history. No signs of aphasia or neglect.   Cranial Nerves: II: Visual Fields are full. Pupils are equal, round, and reactive to light.  Discs are difficult to visualize. III,IV, VI: EOMI without ptosis or diploplia.   V: Facial sensation is symmetric to  temperature VII: Facial movement is decreased on the left with dysarthria. VIII: hearing is intact to voice X: Uvula elevates symmetrically XI: Shoulder shrug is symmetric. XII: tongue is midline without atrophy or fasciculations.   Motor: Tone is normal. Bulk is normal. 5/5 strength was present in right arm and leg. She has 1/5 weakness of the left arm and 3-4/5 of left leg Sensory: Sensation is symmetric to pin in the arms and legs. Deep Tendon Reflexes: 2+ and symmetric in the biceps and patellae.   Cerebellar: FNF and HKS are intact on right, difficult to commensurate with weakness on left Gait: Unable to test due to weakness  ASSESSMENT Ms. Charnese Federici is a 62 y.o. female presenting with left side weakness. tPA was not given due to outside of window. MRI revealed acute infarcts in the right basal ganglia and posterior right MCA territory. 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 Not on anticoagulation prior to admission. Now on aspirin 325 mg orally every day for secondary stroke prevention. Patient with resultant left side weakness. Work up underway.    Diabetes Mellitus, HGB A1C  7.2, goal < 6.5  Hyperlipidemia, LDL: 61, at goal < 70 in diabetics, on lipitor statin.   Patient was out of the window for acute intervention but did meet criteria for IMPACT STUDY.  Hospital day # 3  Weakness of the  left arm and leg  Is stable from yesterday.  TREATMENT/PLAN  Continue aspirin 325 mg orally every day for secondary stroke prevention.  Day #4  IMPACT-24  CIR recommended - rehabilitation M.D. consult pending.  Would not be aggressive with treating blood pressure at this time  Hassel Neth [facility] Pager [phone] 02/27/2013, 8:41 AM  I have personally obtained a history, examined the patient, evaluated imaging results, and formulated the assessment and plan of care. I agree with the above.  Lesly Dukes

## 2013-02-27 NOTE — Progress Notes (Signed)
VASCULAR LAB PRELIMINARY  PRELIMINARY  PRELIMINARY  PRELIMINARY  Carotid Dopplers completed.    Preliminary report:  There is 1-39% ICA stenosis.  Right vertebral artery flow is antegrade.  Left vertebral artery flow not insonated.  [place], Kattia, RVT 02/27/2013, 11:20 AM

## 2013-02-28 ENCOUNTER — Inpatient Hospital Stay (HOSPITAL_COMMUNITY): Payer: Managed Care, Other (non HMO)

## 2013-02-28 LAB — GLUCOSE, CAPILLARY
Glucose-Capillary: 304 mg/dL — ABNORMAL HIGH (ref 70–99)
Glucose-Capillary: 284 mg/dL — ABNORMAL HIGH (ref 70–99)
Glucose-Capillary: 288 mg/dL — ABNORMAL HIGH (ref 70–99)

## 2013-02-28 NOTE — Progress Notes (Signed)
Stroke Team Progress Note  HISTORY  Patricia Black is a 62 y.o. female with a history of diabetes and migraines who presented with left-sided weakness that she noted on awakening on 02/24/2013. She stated that she was normal when she went to bed the night before but when she awoke at 5 AM she was dizzy, and was dropping things from her left hand. She went out to her car, but fell on her way and was unable to get back up. When her daughter had not heard from her by mid morning she called the [facility] Department who responded and found her next to her car and called EMS. NIHSS: 7. tPA was not given due to outside of window .   SUBJECTIVE Day #4 IMPACT. The patient's daughter is at the bedside this morning. The patient is not sleeping well. Apparently she uses a BiPAP machine at home. I asked the patient's daughter to bring the unit to the hospital so she can use it at night. The patient also feels that the bed is uncomfortable. She might benefit from a mild sleep medication such as Ativan at HS.She also notes that she injured her shoulder when she fell at the time of her stroke. If the discomfort continues we might want to consider an x-ray.  OBJECTIVE Most recent Vital Signs: Filed Vitals:   02/27/13 1826 02/27/13 2143 02/28/13 0114 02/28/13 0604  BP: 143/45 121/40 133/36 131/46  Pulse: 72 66 66 65  Temp: 98.6 F (37 C) 97.8 F (36.6 C) 97.7 F (36.5 C) 97.4 F (36.3 C)  TempSrc: Oral Oral Oral Oral  Resp: 18 16 18 20   Height:      Weight:      SpO2: 99% 97% 97% 95%   CBG (last 3)   Recent Labs  02/27/13 1634 02/27/13 2143 02/28/13 0700  GLUCAP 263* 273* 304*    IV Fluid Intake:   . sodium chloride 100 mL/hr at 02/25/13 0437    MEDICATIONS  . aspirin  300 mg Rectal Daily   Or  . aspirin  325 mg Oral Daily  . atorvastatin  80 mg Oral q1800  . heparin  5,000 Units Subcutaneous Q8H  .  HYDROmorphone (DILAUDID) injection  1 mg Intravenous Once  . insulin aspart  0-5 Units  Subcutaneous QHS  . insulin aspart  0-9 Units Subcutaneous TID WC  . midazolam  4 mg Intravenous Once  . pramipexole  0.125 mg Oral BID   PRN:  acetaminophen, acetaminophen, lidocaine  Diet:  Carb Control  Activity: Ambulated DVT Prophylaxis: Heparin Sq  CLINICALLY SIGNIFICANT STUDIES Basic Metabolic Panel:   Recent Labs Lab 02/24/13 1429 02/24/13 1513 02/25/13 0803  NA 140 141 137  K 3.7 3.4* 3.7  CL 106 107 102  CO2 21  --  20  GLUCOSE 131* 134* 247*  BUN 19 19 17   CREATININE 0.80 1.00 0.86  CALCIUM 9.3  --  8.4   Liver Function Tests:   Recent Labs Lab 02/24/13 1429 02/25/13 0803  AST 18 21  ALT 15 13  ALKPHOS 139* 120*  BILITOT 0.5 0.5  PROT 7.1 6.1  ALBUMIN 3.5 3.0*   CBC:   Recent Labs Lab 02/24/13 1429 02/24/13 1513 02/25/13 0803  WBC 12.6*  --  8.8  NEUTROABS 10.2*  --  6.3  HGB 13.5 13.6 11.4*  HCT 39.2 40.0 33.5*  MCV 85.2  --  85.0  PLT 241  --  205   Coagulation:   Recent Labs Lab  02/24/13 1429  LABPROT 13.3  INR 1.03   Cardiac Enzymes:   Recent Labs Lab 02/24/13 1429  CKTOTAL 248*  TROPONINI <0.30   Urinalysis:   Recent Labs Lab 02/24/13 2307  COLORURINE YELLOW  LABSPEC 1.022  PHURINE 5.5  GLUCOSEU NEGATIVE  HGBUR NEGATIVE  BILIRUBINUR NEGATIVE  KETONESUR 40*  PROTEINUR NEGATIVE  UROBILINOGEN 0.2  NITRITE NEGATIVE  LEUKOCYTESUR NEGATIVE   Lipid Panel    Component Value Date/Time   CHOL 118 02/25/2013 0536   TRIG 108 02/25/2013 0536   HDL 35* 02/25/2013 0536   CHOLHDL 3.4 02/25/2013 0536   VLDL 22 02/25/2013 0536   LDLCALC 61 02/25/2013 0536   HgbA1C  Lab Results  Component Value Date   HGBA1C 7.2* 02/25/2013    Urine Drug Screen:     Component Value Date/Time   LABOPIA POSITIVE* 02/24/2013 2307   COCAINSCRNUR NONE DETECTED 02/24/2013 2307   LABBENZ POSITIVE* 02/24/2013 2307   AMPHETMU NONE DETECTED 02/24/2013 2307   THCU NONE DETECTED 02/24/2013 2307   LABBARB NONE DETECTED 02/24/2013 2307     Alcohol Level:   Recent Labs Lab 02/24/13 1429  ETH <11    Dg Chest 2 View 02/24/2013    No acute findings.     Ct Head Wo Contrast 02/24/2013    Dense right middle cerebral artery as described.  Area of vague decreased attenuation in the right basal ganglia laterally suspicious for acute ischemia.    Ct Maxillofacial Wo Cm 02/25/2013    1. The preprocedure and detail in of anatomy for identification of the sphenopalatine ganglion. 2. Hyperdense right MCA with hypo attenuation in the right lentiform nucleus and insular cortex compatible with a right MCA territory infarct.     MRI Head 02/26/2013 1. MRI discontinued prior to completion due to patient condition.  2. Acute infarcts in the right basal ganglia and posterior right MCA territory. Associated edema without mass effect. No definite associated hemorrhage.  MRA Head 02/26/2013 Right MCA occluded just beyond its origin. Minimal if any reconstituted right MCA flow. Dominant appearing distal right vertebral artery. No normal distal left vertebral artery identified.  2-D Echo - ejection fraction 65-70%. No cardiac source of emboli was identified.  Doppler Carotid Dopplers completed.  Preliminary report: There is 1-39% ICA stenosis. Right vertebral artery flow is antegrade. Left vertebral artery flow not insonated   EKG: sinus rhythm, normal axis, normal R wave progression, T wave flattening in aVL and V3, no obvious ischemia change.   Therapy Recommendations - inpatient rehabilitation recommended   Physical Exam:   Filed Vitals:   02/27/13 1826 02/27/13 2143 02/28/13 0114 02/28/13 0604  BP: 143/45 121/40 133/36 131/46  Pulse: 72 66 66 65  Temp: 98.6 F (37 C) 97.8 F (36.6 C) 97.7 F (36.5 C) 97.4 F (36.3 C)  TempSrc: Oral Oral Oral Oral  Resp: 18 16 18 20   Height:      Weight:      SpO2: 99% 97% 97% 95%    General: Not in acute distress HEENT: PERRL, EOMI, no scleral icterus, No JVD or bruit Cardiac:  S1/S2, RRR, No murmurs, gallops or rubs Pulm: Good air movement bilaterally. Clear to auscultation bilaterally. No rales, wheezing, rhonchi or rubs. Abd: Soft, nondistended, nontender, no rebound pain, no organomegaly, BS present Ext: No edema. 2+DP/PT pulse bilaterally Musculoskeletal: No joint deformities, erythema, or stiffness, ROM full Skin: No rashes.  Psych: Patient is not psychotic, no suicidal or hemocidal ideation.  Mental Status: Patient is awake,  alert, oriented to person, place, month, year, and situation. Immediate and remote memory are intact. Patient is able to give a clear and coherent history. No signs of aphasia or neglect.   Cranial Nerves: II: Visual Fields are full. Pupils are equal, round, and reactive to light.  Discs are difficult to visualize. III,IV, VI: EOMI without ptosis or diploplia.   V: Facial sensation is symmetric to temperature VII: Facial movement is decreased on the left with dysarthria. VIII: hearing is intact to voice X: Uvula elevates symmetrically XI: Shoulder shrug is symmetric. XII: tongue is midline without atrophy or fasciculations.   Motor: Tone is normal. Bulk is normal. 5/5 strength was present in right arm and leg. She has 1/5 weakness of the left arm and 3-4/5 of left leg Sensory: Sensation is symmetric to pin in the arms and legs. Deep Tendon Reflexes: 2+ and symmetric in the biceps and patellae.   Cerebellar: FNF and HKS are intact on right, difficult to commensurate with weakness on left Gait: Unable to test due to weakness  ASSESSMENT Ms. Ellean Firman is a 63 y.o. female presenting with left side weakness. tPA was not given due to outside of window. MRI revealed acute infarcts in the right basal ganglia and posterior right MCA territory. 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 Not on anticoagulation prior to admission. Now on aspirin 325 mg orally every day for secondary stroke  prevention. Patient with resultant left side weakness. Work up underway.    Diabetes Mellitus, HGB A1C  7.2, goal < 6.5  Hyperlipidemia, LDL: 61, at goal < 70 in diabetics, on lipitor statin.   Patient was out of the window for acute intervention but did meet criteria for IMPACT STUDY.  Hospital day # 4  Weakness of the left arm and leg  Is stable from yesterday.  TREATMENT/PLAN  Continue aspirin 325 mg orally every day for secondary stroke prevention.  Day # 5  IMPACT-24  CIR recommended - rehabilitation M.D. has seen patient and agrees with inpatient rehabilitation.  Would not be aggressive with treating blood pressure at this time  Right shoulder injury at time of her stroke. Monitor. Consider x-ray.  Difficulty sleeping. BiPAP machine to be brought in from home. Consider sleep med at HS.  Delton See PA-C [facility] Pager [phone] 02/28/2013, 9:45 AM  I have personally obtained a history, examined the patient, evaluated imaging results, and formulated the assessment and plan of care. I agree with the above.   Lesly Dukes

## 2013-02-28 NOTE — Progress Notes (Signed)
Pt.'s home CPAP & mask was set up at the bedside. CPAP was checked for frayed wires by RT & none were found. CPAP is set at 16cm H2O. Pt. Stated that she would place herself on CPAP before going to bed. Pt. Was made aware to let RT know if she needed assistance.

## 2013-03-01 ENCOUNTER — Inpatient Hospital Stay (HOSPITAL_COMMUNITY): Payer: Managed Care, Other (non HMO)

## 2013-03-01 LAB — GLUCOSE, CAPILLARY
Glucose-Capillary: 301 mg/dL — ABNORMAL HIGH (ref 70–99)
Glucose-Capillary: 252 mg/dL — ABNORMAL HIGH (ref 70–99)
Glucose-Capillary: 261 mg/dL — ABNORMAL HIGH (ref 70–99)

## 2013-03-01 MED ORDER — LIDOCAINE HCL (CARDIAC) 20 MG/ML IV SOLN
INTRAVENOUS | Status: AC
  Administered 2013-03-01: 100 mg
  Filled 2013-03-01: qty 5

## 2013-03-01 MED ORDER — MORPHINE SULFATE 2 MG/ML IJ SOLN
2.0000 mg | Freq: Once | INTRAMUSCULAR | Status: AC
  Administered 2013-03-01: 2 mg via INTRAVENOUS
  Filled 2013-03-01: qty 1

## 2013-03-01 MED ORDER — LIDOCAINE VISCOUS 2 % MT SOLN
15.0000 mL | Freq: Once | OROMUCOSAL | Status: DC
  Filled 2013-03-01: qty 15

## 2013-03-01 NOTE — Progress Notes (Signed)
Seen and agreed 03/01/2013 Weston Kallman Elizabeth PTA [phone] pager [phone] office    

## 2013-03-01 NOTE — Progress Notes (Signed)
Physical Therapy Treatment Patient Details Name: Patricia Black MRN: [id number] DOB: 02-21-1951 Today's Date: 03/01/2013 Time: 0865-7846 PT Time Calculation (min): 44 min  PT Assessment / Plan / Recommendation  History of Present Illness 62 y.o. female with a hx of DM and migraine who presents with left-sided weakness that was present on awakening. She states that she was normal when she went to bed last night, but when she awoke at 5 AM she was dizzy, and was dropping things from her left hand. She went out to her car, but fell on her way and was unable to get back up. When her daughter had not heard from her by mid morning she called the [facility] Department who responded and found her next to her car and called EMS. NIHSS: 7. 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   PT Comments   Pt progressing with mobility today. Pt completed x5 partial sit<>stands with steady and x3 from bed with decreasing assistance needed throughout tx..  Pt demonstrated weight-shifting in stedy to build confidence prior to future gait training. Very little knee buckling noted during session.  Pt motivated and has good family support upon future d/c.  Pt will greatly benefit from CIR to maximize functional independence prior to d/c home.  Follow Up Recommendations  CIR     Does the patient have the potential to tolerate intense rehabilitation     Barriers to Discharge        Equipment Recommendations       Recommendations for Other Services Rehab consult  Frequency Min 4X/week   Progress towards PT Goals Progress towards PT goals: Progressing toward goals  Plan Current plan remains appropriate    Precautions / Restrictions Precautions Precautions: Fall   Pertinent Vitals/Pain Denied pain    Mobility  Bed Mobility Bed Mobility: Sitting - Scoot to Edge of Bed;Rolling Right;Right Sidelying to Sit Rolling Right: 4: Min assist;With  rail Right Sidelying to Sit: 4: Min assist;With rails Sitting - Scoot to [place] of Bed: 4: Min assist;With rail Details for Bed Mobility Assistance: vc's for technique and sequencing  Transfers Transfers: Sit to Stand;Stand to Sit;Stand Pivot Transfers Sit to Stand: 3: Mod assist;2: Max assist Stand to Sit: 3: Mod assist;2: Max Multimedia programmer Transfers: 1: +1 Total assist Stand Pivot Transfers: Patient Percentage: 50% Transfer via Lift Equipment: Stedy Details for Transfer Assistance: vc's for technique; pt required max assistance from bed with sit<>stands, after partial sit<>stands with steady pt able to complete sit<>stand mod assist from bed.  mild buckling LLE noted today blocked with SPT    Exercises General Exercises - Lower Extremity Ankle Circles/Pumps: AROM;10 reps;Both Hip ABduction/ADduction: AROM;10 reps;Both Straight Leg Raises: AROM;AAROM;Both;10 reps   PT Diagnosis:    PT Problem List:   PT Treatment Interventions:     PT Goals (current goals can now be found in the care plan section)    Visit Information  Last PT Received On: 03/01/13 Assistance Needed: +1 History of Present Illness: 62 y.o. female with a hx of DM and migraine who presents with left-sided weakness that was present on awakening. She states that she was normal when she went to bed last night, but when she awoke at 5 AM she was dizzy, and was dropping things from her left hand. She went out to her car, but fell on her way and was unable to get back up. When her daughter had not  heard from her by mid morning she called the [facility] Department who responded and found her next to her car and called EMS. NIHSS: 7. 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    Subjective Data      Cognition  Cognition Arousal/Alertness: Awake/alert Behavior During Therapy: WFL for tasks assessed/performed Overall Cognitive Status: Within  Functional Limits for tasks assessed    Balance     End of Session PT - End of Session Equipment Utilized During Treatment: Gait belt Activity Tolerance: Patient tolerated treatment well;Patient limited by fatigue Patient left: in chair;with call bell/phone within reach Nurse Communication: Mobility status   GP     Ernestina Columbia, SPTA 03/01/2013, 12:14 PM

## 2013-03-01 NOTE — Progress Notes (Signed)
Rehab admissions - Evaluated for possible admission.  I spoke with patient and her daughter.  They do want to do inpatient rehab here at [facility].  Discharge would be after inpatient rehab up to [place] with daughter.  I called [employer] and there is a problems between precert and benefits departments.  Precert says her policy termed on 11/30, but benefits says policy is active.  Rosann Auerbach had to generate a "ticket" to fix this problems which could take 24 hrs before we can begin precert process.  I will continue to work on this Rosann Auerbach issue in hopes of admitting to inpatient rehab as soon as I can get authorization.  Call me for questions.  #[phone]

## 2013-03-01 NOTE — Progress Notes (Signed)
Occupational Therapy Treatment Patient Details Name: Patricia Black MRN: [id number] DOB: 08-29-1950 Today's Date: 03/01/2013 Time: 1191-4782 OT Time Calculation (min): 25 min  OT Assessment / Plan / Recommendation  History of present illness 62 y.o. female with a hx of DM and migraine who presents with left-sided weakness that was present on awakening. She states that she was normal when she went to bed last night, but when she awoke at 5 AM she was dizzy, and was dropping things from her left hand. She went out to her car, but fell on her way and was unable to get back up. When her daughter had not heard from her by mid morning she called the [facility] Department who responded and found her next to her car and called EMS. NIHSS: 7. 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   OT comments  Pt demonstrates toilet and sink level transfers with stedy this session. Pt very excited to get back to normal surface levels for basic adls. Pt with decr AROM and strength in LT UE compared to evaluation. Pt demonstrates shoulder shrug and adduction of shoulder.  Follow Up Recommendations  CIR    Barriers to Discharge       Equipment Recommendations  3 in 1 bedside comode;Wheelchair (measurements OT);Wheelchair cushion (measurements OT)    Recommendations for Other Services Rehab consult (in [place] placement)  Frequency Min 3X/week   Progress towards OT Goals Progress towards OT goals: Progressing toward goals  Plan Discharge plan remains appropriate    Precautions / Restrictions Precautions Precautions: Fall   Pertinent Vitals/Pain None reported Pt fatigued from being in chair all morning but willing to continue with therapy    ADL  Grooming: Wash/dry face;Teeth care;Min guard Where Assessed - Grooming: Supported sitting (sitting on stedy with weight bearing LT UE) Toilet Transfer: Moderate assistance Toilet Transfer Method:  Sit to stand Toilet Transfer Equipment: Raised toilet seat with arms (or 3-in-1 over toilet) (using stedy to sit on bathroom toilet with 3n1) Toileting - Clothing Manipulation and Hygiene: +1 Total assistance Where Assessed - Toileting Clothing Manipulation and Hygiene: Sit to stand from 3-in-1 or toilet Equipment Used: Gait belt;Other (comment) (stedy) Transfers/Ambulation Related to ADLs: Pt stepping with RT LE and dragging LT LE. PT's LT LE required blocking. Pt completed transfer sit<>Stand in stedy and pushed to sink level grooming. PT sit<>Stand in bathroom at sink and toilet in stedy. RN Tobi Bastos made aware pt is allowed to use stedy for transfers to restroom now ADL Comments: Pt completed bed mobility with (A) exiting lt side. Pt transferd in stedy to sink level. Pt washing face and brushing mouth with tooth brush. Pt with backward chaining. Pt with all necessary items setup on counter surface. Pt attempting to push on soap dispenser to get a cup. pt states "I thought cups were in there" Pt transfered onto 3n1 over regular height toilet this session. Pt very excited to return to the bathroom level surfaces for daily routine. Pt transfered from stedy to bed level. Transport arriving to take to x ray.    OT Diagnosis:    OT Problem List:   OT Treatment Interventions:     OT Goals(current goals can now be found in the care plan section) Acute Rehab OT Goals Patient Stated Goal: to get better OT Goal Formulation: With patient/family Time For Goal Achievement: 03/12/13 Potential to Achieve Goals: Good ADL Goals Pt Will Perform Grooming: with  set-up;sitting Pt Will Perform Upper Body Bathing: with set-up;sitting Pt Will Perform Lower Body Bathing: with min assist;sit to/from stand;with adaptive equipment Pt Will Perform Upper Body Dressing: with set-up;sitting Pt Will Perform Lower Body Dressing: with min assist;sit to/from stand;with adaptive equipment Pt Will Transfer to Toilet: with min  assist;bedside commode  Visit Information  Last OT Received On: 03/01/13 Assistance Needed: +1 History of Present Illness: 62 y.o. female with a hx of DM and migraine who presents with left-sided weakness that was present on awakening. She states that she was normal when she went to bed last night, but when she awoke at 5 AM she was dizzy, and was dropping things from her left hand. She went out to her car, but fell on her way and was unable to get back up. When her daughter had not heard from her by mid morning she called the [facility] Department who responded and found her next to her car and called EMS. NIHSS: 7. 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    Subjective Data      Prior Functioning       Cognition  Cognition Arousal/Alertness: Awake/alert Behavior During Therapy: WFL for tasks assessed/performed Overall Cognitive Status: Within Functional Limits for tasks assessed Memory: Decreased short-term memory ("have a good weekend" - unaware today is Monday)    Mobility  Bed Mobility Bed Mobility: Rolling Left;Left Sidelying to Sit;Supine to Sit;Sitting - Scoot to [place] of Bed;Sit to Supine Rolling Right: 4: Min assist;With rail Rolling Left: 4: Min assist;With rail Right Sidelying to Sit: 4: Min assist;With rails Left Sidelying to Sit: 4: Min assist;With rails;HOB elevated Supine to Sit: 3: Mod assist;With rails Sitting - Scoot to Edge of Bed: 3: Mod assist Sit to Supine: 3: Mod assist;HOB flat Details for Bed Mobility Assistance: Pt needed (A) to sequence task due to LT side affected. Pt needed v/c for safety with LT UE during mobility Transfers Transfers: Sit to Stand;Stand to Sit Sit to Stand: 3: Mod assist;From bed Stand to Sit: 3: Mod assist;To chair/3-in-1 Transfer via Lift Equipment: Stedy Details for Transfer Assistance: v/c for safety adn hand placement. Pt needed (A) to maintain LT UE on  stedy handle.     Exercises  General Exercises - Lower Extremity Ankle Circles/Pumps: AROM;10 reps;Both Hip ABduction/ADduction: AROM;10 reps;Both Straight Leg Raises: AROM;AAROM;Both;10 reps Other Exercises Other Exercises: Pt with tone present in LT UE. Pt with shoulder shrug and decr Bicep and tricep compared to evaluation. PT provided scapula PROM and abduction of shoulder for pecorlis tightness. Pt provided PNF PROM . Pt with reduced tone after PROM exercises.    Balance     End of Session OT - End of Session Activity Tolerance: Patient tolerated treatment well Patient left: in bed;Other (comment) (transport arriving for testing) Nurse Communication: Mobility status;Precautions  GO     Harolyn Rutherford 03/01/2013, 2:06 PM Pager: [phone]

## 2013-03-01 NOTE — Progress Notes (Signed)
Inpatient Diabetes Program Recommendations  AACE/ADA: New Consensus Statement on Inpatient Glycemic Control (2013)  Target Ranges:  Prepandial:   less than 140 mg/dL      Peak postprandial:   less than 180 mg/dL (1-2 hours)      Critically ill patients:  140 - 180 mg/dL  Results for Patricia Black, Patricia Black (MRN [id number]) as of 03/01/2013 12:44  Ref. Range 02/28/2013 07:00 02/28/2013 11:52 02/28/2013 16:15 02/28/2013 21:58 03/01/2013 06:50  Glucose-Capillary Latest Range: 70-99 mg/dL 295 (H) 621 (H) 308 (H) 288 (H) 295 (H)    Inpatient Diabetes Program Recommendations Insulin - Basal: please add basal Lantus or Levemir 25 units  Thank you  Piedad Climes BSN, RN,CDE Inpatient Diabetes Coordinator [phone] (team pager)

## 2013-03-01 NOTE — Progress Notes (Signed)
OT EVALUATION NOTE - Late entry  02/26/13 1200  OT Visit Information  Last OT Received On 03/28/13  Assistance Needed +1 (+2 for ambulation)  History of Present Illness 62 y.o. female with a hx of DM and migraine who presents with left-sided weakness that was present on awakening. She states that she was normal when she went to bed last night, but when she awoke at 5 AM she was dizzy, and was dropping things from her left hand. She went out to her car, but fell on her way and was unable to get back up. When her daughter had not heard from her by mid morning she called the [facility] Department who responded and found her next to her car and called EMS. NIHSS: 7. 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  Precautions  Precautions Fall  Home Living  Family/patient expects to be discharged to: Inpatient rehab  Living Arrangements Alone  Available Help at Discharge Family  Type of Home House  Home Access Stairs to enter  Entrance Stairs-Number of Steps 4  Home Layout Two level  Bathroom Shower/Tub Walk-in shower;Tub only (tub only on main floor, must go upstairs to shower)  Tour manager None  Lives With Alone  Prior Function  Level of Independence Independent  Communication  Communication No difficulties  Cognition  Arousal/Alertness Awake/alert  Behavior During Therapy WFL for tasks assessed/performed  Overall Cognitive Status Within Functional Limits for tasks assessed  Upper Extremity Assessment  Upper Extremity Assessment LUE deficits/detail  LUE Deficits / Details Shoulder shrug present bicep and triceps . Pt unable to complete shoulder elelvation. Pt with no active wrist or digits  LUE Sensation decreased light touch  LUE Coordination decreased fine motor;decreased gross motor  Lower Extremity Assessment  Lower Extremity Assessment Defer to PT evaluation  Cervical / Trunk  Assessment  Cervical / Trunk Assessment Normal  ADL  Eating/Feeding Set up  Where Assessed - Eating/Feeding Chair (using Rt UE)  Upper Body Dressing Moderate assistance  Where Assessed - Upper Body Dressing Unsupported sitting  Toilet Transfer Moderate assistance  Toilet Transfer Method Stand pivot  Toilet Transfer Equipment Raised toilet seat with arms (or 3-in-1 over toilet)  Equipment Used Gait belt  Transfers/Ambulation Related to ADLs pt required lt le blocking to stand pivot to the right side  ADL Comments Pt supine on arrival with family present . pt with noticable Lt facial droop. Pt reports decr sleep due to restless legs during PM. pt needed (A) to sequence bed mobility exiting to the left side. Pt with Lt Ue bicep and tricep activation. pt with decr light sensation on the left side with decr awarenss. Pt with good recall of education by PT [facility] for sensation and holding LT UE with Rt Ue. pt set up in chair to eat lunch. Pt wishes to d/c to CIR in [place] near daughter . Daughter reports hospital is [facility].   Vision - History  Baseline Vision No visual deficits  Vision - Assessment  Vision Assessment Vision not tested  Bed Mobility  Bed Mobility Rolling Left;Left Sidelying to Sit;Supine to Sit;Sitting - Scoot to [place] of Bed  Rolling Left 4: Min assist;With rail  Left Sidelying to Sit With rails;3: Mod assist  Details for Bed Mobility Assistance cues for sequence and hand positioning  Transfers  Transfers Sit to Stand;Stand to Sit  Sit to Stand 3: Mod assist;With  upper extremity assist;From bed  Stand to Sit 3: Mod assist;With upper extremity assist;To chair/3-in-1  Details for Transfer Assistance cues for safety and required LT LE to be blocked  OT - End of Session  Activity Tolerance Patient tolerated treatment well  Patient left in chair;with call bell/phone within reach;with family/visitor present  Nurse Communication Mobility status;Precautions  OT Assessment  OT  Recommendation/Assessment Patient needs continued OT Services  OT Problem List Decreased strength;Decreased activity tolerance;Impaired balance (sitting and/or standing);Decreased safety awareness;Decreased knowledge of use of DME or AE;Decreased knowledge of precautions;Impaired UE functional use;Decreased range of motion  OT Therapy Diagnosis  Generalized weakness;Hemiplegia dominant side  OT Plan  OT Frequency Min 3X/week  OT Treatment/Interventions Self-care/ADL training;Therapeutic exercise;Neuromuscular education;DME and/or AE instruction;Therapeutic activities;Patient/family education;Balance training  OT Recommendation  Recommendations for Other Services Rehab consult (in [place] near daughters home)  Follow Up Recommendations CIR  OT Equipment 3 in 1 bedside comode;Wheelchair (measurements OT);Wheelchair cushion (measurements OT)  Individuals Consulted  Consulted and Agree with Results and Recommendations Patient;Family member/caregiver  Acute Rehab OT Goals  Patient Stated Goal to get better  OT Goal Formulation With patient/family  Time For Goal Achievement 03/12/13  Potential to Achieve Goals Good  OT Time Calculation  OT Start Time 1138  OT Stop Time 1204  OT Time Calculation (min) 26 min  OT General Charges  $OT Visit 1 Procedure  OT Evaluation  $Initial OT Evaluation Tier I 1 Procedure  OT Treatments  $Self Care/Home Management  8-22 mins  Written Expression  Dominant Hand Left

## 2013-03-01 NOTE — Progress Notes (Signed)
Stroke Team Progress Note  HISTORY  Patricia Black is a 62 y.o. female with a history of diabetes and migraines who presented with left-sided weakness that she noted on awakening on 02/24/2013. She stated that she was normal when she went to bed the night before but when she awoke at 5 AM she was dizzy, and was dropping things from her left hand. She went out to her car, but fell on her way and was unable to get back up. When her daughter had not heard from her by mid morning she called the [facility] Department who responded and found her next to her car and called EMS. NIHSS: 7. tPA was not given due to outside of window .   SUBJECTIVE Impact study completed. For device removal today. Follows commands and is able to move right side some today. Daughter in room.  OBJECTIVE Most recent Vital Signs: Filed Vitals:   02/28/13 1417 02/28/13 1849 02/28/13 2212 03/01/13 0624  BP: 139/46 127/45 147/45 102/69  Pulse: 68 65 58 66  Temp: 98.1 F (36.7 C) 98 F (36.7 C) 97.4 F (36.3 C) 98 F (36.7 C)  TempSrc: Oral Oral Axillary Oral  Resp: 18 18 20 18   Height:      Weight:      SpO2: 97% 97% 100% 98%   CBG (last 3)   Recent Labs  02/28/13 1615 02/28/13 2158 03/01/13 0650  GLUCAP 284* 288* 295*    IV Fluid Intake:   . sodium chloride 100 mL/hr at 02/25/13 0437    MEDICATIONS  . aspirin  300 mg Rectal Daily   Or  . aspirin  325 mg Oral Daily  . atorvastatin  80 mg Oral q1800  . heparin  5,000 Units Subcutaneous Q8H  .  HYDROmorphone (DILAUDID) injection  1 mg Intravenous Once  . insulin aspart  0-5 Units Subcutaneous QHS  . insulin aspart  0-9 Units Subcutaneous TID WC  . midazolam  4 mg Intravenous Once  . pramipexole  0.125 mg Oral BID   PRN:  acetaminophen, acetaminophen, lidocaine  Diet:  Carb Control  Activity: Ambulated DVT Prophylaxis: Heparin Sq  CLINICALLY SIGNIFICANT STUDIES Basic Metabolic Panel:   Recent Labs Lab 02/24/13 1429 02/24/13 1513  02/25/13 0803  NA 140 141 137  K 3.7 3.4* 3.7  CL 106 107 102  CO2 21  --  20  GLUCOSE 131* 134* 247*  BUN 19 19 17   CREATININE 0.80 1.00 0.86  CALCIUM 9.3  --  8.4   Liver Function Tests:   Recent Labs Lab 02/24/13 1429 02/25/13 0803  AST 18 21  ALT 15 13  ALKPHOS 139* 120*  BILITOT 0.5 0.5  PROT 7.1 6.1  ALBUMIN 3.5 3.0*   CBC:   Recent Labs Lab 02/24/13 1429 02/24/13 1513 02/25/13 0803  WBC 12.6*  --  8.8  NEUTROABS 10.2*  --  6.3  HGB 13.5 13.6 11.4*  HCT 39.2 40.0 33.5*  MCV 85.2  --  85.0  PLT 241  --  205   Coagulation:   Recent Labs Lab 02/24/13 1429  LABPROT 13.3  INR 1.03   Cardiac Enzymes:   Recent Labs Lab 02/24/13 1429  CKTOTAL 248*  TROPONINI <0.30   Urinalysis:   Recent Labs Lab 02/24/13 2307  COLORURINE YELLOW  LABSPEC 1.022  PHURINE 5.5  GLUCOSEU NEGATIVE  HGBUR NEGATIVE  BILIRUBINUR NEGATIVE  KETONESUR 40*  PROTEINUR NEGATIVE  UROBILINOGEN 0.2  NITRITE NEGATIVE  LEUKOCYTESUR NEGATIVE   Lipid Panel  Component Value Date/Time   CHOL 118 02/25/2013 0536   TRIG 108 02/25/2013 0536   HDL 35* 02/25/2013 0536   CHOLHDL 3.4 02/25/2013 0536   VLDL 22 02/25/2013 0536   LDLCALC 61 02/25/2013 0536   HgbA1C  Lab Results  Component Value Date   HGBA1C 7.2* 02/25/2013    Urine Drug Screen:     Component Value Date/Time   LABOPIA POSITIVE* 02/24/2013 2307   COCAINSCRNUR NONE DETECTED 02/24/2013 2307   LABBENZ POSITIVE* 02/24/2013 2307   AMPHETMU NONE DETECTED 02/24/2013 2307   THCU NONE DETECTED 02/24/2013 2307   LABBARB NONE DETECTED 02/24/2013 2307    Alcohol Level:   Recent Labs Lab 02/24/13 1429  ETH <11    Dg Chest 2 View 02/24/2013    No acute findings.     Ct Head Wo Contrast 02/24/2013    Dense right middle cerebral artery as described.  Area of vague decreased attenuation in the right basal ganglia laterally suspicious for acute ischemia.   02/28/2013 Evolving right MCA infarcts, grossly  stable since  diffusion-weighted imaging on 02/26/2013. There is petechial  hemorrhage in the right basal ganglia, but no mass effect.  2. No new intracranial abnormality identified.  3. Metallic lead in place for right sphenopalatine ganglion  stimulation, tracking from the hard palate to the right  pterygopalatine fossa.  4. Otherwise stable face CT.   Ct Maxillofacial Wo Cm 02/25/2013    1. The preprocedure and detail in of anatomy for identification of the sphenopalatine ganglion. 2. Hyperdense right MCA with hypo attenuation in the right lentiform nucleus and insular cortex compatible with a right MCA territory infarct.     MRI Head 02/26/2013 1. MRI discontinued prior to completion due to patient condition.  2. Acute infarcts in the right basal ganglia and posterior right MCA territory. Associated edema without mass effect. No definite associated hemorrhage.  MRA Head 02/26/2013 Right MCA occluded just beyond its origin. Minimal if any reconstituted right MCA flow. Dominant appearing distal right vertebral artery. No normal distal left vertebral artery identified.  2-D Echo - ejection fraction 65-70%. No cardiac source of emboli was identified.  Doppler Carotid Dopplers completed.  Preliminary report: There is 1-39% ICA stenosis. Right vertebral artery flow is antegrade. Left vertebral artery flow not insonated   EKG: sinus rhythm, normal axis, normal R wave progression, T wave flattening in aVL and V3, no obvious ischemia change.   Therapy Recommendations - CIR   Physical Exam:   Filed Vitals:   02/28/13 1417 02/28/13 1849 02/28/13 2212 03/01/13 0624  BP: 139/46 127/45 147/45 102/69  Pulse: 68 65 58 66  Temp: 98.1 F (36.7 C) 98 F (36.7 C) 97.4 F (36.3 C) 98 F (36.7 C)  TempSrc: Oral Oral Axillary Oral  Resp: 18 18 20 18   Height:      Weight:      SpO2: 97% 97% 100% 98%    General: Not in acute distress HEENT: PERRL, EOMI, no scleral icterus, No JVD or  bruit Cardiac: S1/S2, RRR, No murmurs, gallops or rubs Pulm: Good air movement bilaterally. Clear to auscultation bilaterally. No rales, wheezing, rhonchi or rubs. Abd: Soft, nondistended, nontender, no rebound pain, no organomegaly, BS present Ext: No edema. 2+DP/PT pulse bilaterally Musculoskeletal: No joint deformities, erythema, or stiffness, ROM full Skin: No rashes.  Psych: Patient is not psychotic, no suicidal or hemocidal ideation.  Mental Status: Patient is awake, alert, oriented to person, place, month, year, and situation. Immediate and remote memory  are intact. Patient is able to give a clear and coherent history. No signs of aphasia or neglect.   Cranial Nerves: II: Visual Fields are full. Pupils are equal, round, and reactive to light.  Discs are difficult to visualize. III,IV, VI: EOMI without ptosis or diploplia.  partial left hemianopsia V: Facial sensation is symmetric to temperature VII: Facial movement is decreased on the left with dysarthria. VIII: hearing is intact to voice X: Uvula elevates symmetrically XI: Shoulder shrug is symmetric. XII: tongue is midline without atrophy or fasciculations.   Motor: Tone is normal. Bulk is normal. 5/5 strength was present in right arm and leg. She has 1/5 weakness of the left arm and 3/5 of left leg Sensory: Sensation is symmetric to pin in the arms and legs. Deep Tendon Reflexes: 2+ and symmetric in the biceps and patellae.   Cerebellar: FNF and HKS are intact on right, difficult to commensurate with weakness on left Gait: Unable to test due to weakness  ASSESSMENT Patricia Black is a 62 y.o. female presenting with left side weakness. tPA was not given due to outside of window. MRI revealed acute infarcts in the right basal ganglia and posterior right MCA territory. 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 Not on anticoagulation prior to admission. Now on aspirin 325 mg orally  every day for secondary stroke prevention. Patient with resultant left side weakness. Work up underway.    Diabetes Mellitus, HGB A1C  7.2, goal < 6.5  Hyperlipidemia, LDL: 61, at goal < 70 in diabetics, on lipitor statin.   Patient was out of the window for acute intervention but did meet criteria for IMPACT STUDY.  Obstructive sleep apnea: on bipap at home.  Hospital day # 5  Weakness of the left arm and leg  Is stable from yesterday.  TREATMENT/PLAN  Continue aspirin 325 mg orally every day for secondary stroke prevention.  Day # 5  IMPACT-24 completed on 02/28/2013. Device for sphenopalatine ganglion stimulation to be removed today. TEE to look for embolic source. I have arranged for 03/02/2013. If positive for PFO (patent foramen ovale), check bilateral lower extremity venous dopplers to rule out DVT as possible source of stroke.   Right shoulder injury at time of her stroke. Check films.  Anticipate that the patient will be ready for CIR sometime tomorrow after TEE (if done early enough). Await TEE tomorrow plus IMPACT study hardware removal today.  Patient should follow up in office in 2 months. Research will call for follow up appointment.  Dr. Pearlean Brownie discussed plan of care with patient and family (daughter)  Gwendolyn Lima. Manson Passey, [facility], MBA, MHA [facility] Pager: [phone] 03/01/2013 2:09 PM  I have personally obtained a history, examined the patient, evaluated imaging results, and formulated the assessment and plan of care. I agree with the above. Delia Heady, MD

## 2013-03-02 ENCOUNTER — Encounter (HOSPITAL_COMMUNITY): Payer: Self-pay | Admitting: *Deleted

## 2013-03-02 ENCOUNTER — Encounter (HOSPITAL_COMMUNITY)
Admission: EM | Disposition: A | Discharge status: Rehabilitation Facility | Payer: Self-pay | Source: Home / Self Care | Attending: Neurology

## 2013-03-02 DIAGNOSIS — I059 Rheumatic mitral valve disease, unspecified: Secondary | ICD-10-CM

## 2013-03-02 DIAGNOSIS — I824Z9 Acute embolism and thrombosis of unspecified deep veins of unspecified distal lower extremity: Secondary | ICD-10-CM

## 2013-03-02 HISTORY — PX: TEE WITHOUT CARDIOVERSION: SHX5443

## 2013-03-02 LAB — GLUCOSE, CAPILLARY
Glucose-Capillary: 295 mg/dL — ABNORMAL HIGH (ref 70–99)
Glucose-Capillary: 253 mg/dL — ABNORMAL HIGH (ref 70–99)

## 2013-03-02 SURGERY — ECHOCARDIOGRAM, TRANSESOPHAGEAL
Anesthesia: Moderate Sedation

## 2013-03-02 MED ORDER — FENTANYL CITRATE 0.05 MG/ML IJ SOLN
INTRAMUSCULAR | Status: DC | PRN
  Administered 2013-03-02: 25 ug via INTRAVENOUS

## 2013-03-02 MED ORDER — FENTANYL CITRATE 0.05 MG/ML IJ SOLN
INTRAMUSCULAR | Status: AC
  Filled 2013-03-02: qty 2

## 2013-03-02 MED ORDER — BUTAMBEN-TETRACAINE-BENZOCAINE 2-2-14 % EX AERO
INHALATION_SPRAY | CUTANEOUS | Status: DC | PRN
  Administered 2013-03-02: 1 via TOPICAL

## 2013-03-02 MED ORDER — MIDAZOLAM HCL 10 MG/2ML IJ SOLN
INTRAMUSCULAR | Status: DC | PRN
  Administered 2013-03-02 (×2): 2 mg via INTRAVENOUS

## 2013-03-02 MED ORDER — MIDAZOLAM HCL 5 MG/ML IJ SOLN
INTRAMUSCULAR | Status: AC
  Filled 2013-03-02: qty 2

## 2013-03-02 MED ORDER — SODIUM CHLORIDE 0.9 % IV SOLN: INTRAVENOUS | Status: DC

## 2013-03-02 MED ORDER — HEPARIN (PORCINE) IN NACL 100-0.45 UNIT/ML-% IJ SOLN
1150.0000 [IU]/h | INTRAMUSCULAR | Status: DC
  Administered 2013-03-02: 900 [IU]/h via INTRAVENOUS
  Filled 2013-03-02 (×2): qty 250

## 2013-03-02 NOTE — Progress Notes (Signed)
Occupational Therapy Treatment Patient Details Name: Patricia Black MRN: [id number] DOB: [birth year] Today's Date: 03/02/2013 Time: 4098-1191 OT Time Calculation (min): 23 min  OT Assessment / Plan / Recommendation  History of present illness 62 y.o. female with a hx of DM and migraine who presents with left-sided weakness that was present on awakening. She states that she was normal when she went to bed last night, but when she awoke at 5 AM she was dizzy, and was dropping things from her left hand. She went out to her car, but fell on her way and was unable to get back up. When her daughter had not heard from her by mid morning she called the [facility] Department who responded and found her next to her car and called EMS. NIHSS: 7. 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   OT comments  Pt demonstrates incr ease using the stedy for transfers and incr problem solving at sink level. Pt using left hand during session in any functional way possible. Pt using RT UE to ensure that LT UE remains in a safe position during transfers and weight bearing whenever possible. Pt progressing well and remains strong CIR candidate.  Follow Up Recommendations  CIR    Barriers to Discharge       Equipment Recommendations  3 in 1 bedside comode;Wheelchair (measurements OT);Wheelchair cushion (measurements OT)    Recommendations for Other Services Rehab consult  Frequency Min 3X/week   Progress towards OT Goals Progress towards OT goals: Progressing toward goals  Plan Discharge plan remains appropriate    Precautions / Restrictions Precautions Precautions: Fall   Pertinent Vitals/Pain [name] reported Pending TEE this AM. Pt reports scheduled for 9- 9:30 AM    ADL  Grooming: Wash/dry hands;Wash/dry face;Teeth care;Min guard Where Assessed - Grooming: Supported sitting (sitting on stedy) Toilet Transfer: Minimal  assistance Toilet Transfer Method: Sit to stand Toilet Transfer Equipment: Raised toilet seat with arms (or 3-in-1 over toilet);Other (comment) (using stedy) Toileting - Clothing Manipulation and Hygiene: +1 Total assistance Where Assessed - Toileting Clothing Manipulation and Hygiene: Sit to stand from 3-in-1 or toilet Equipment Used: Other (comment) (stedy) Transfers/Ambulation Related to ADLs: Pt completed sit<>stand in stedy x4 during session ADL Comments: Pt on 3n1 at bedside with 3n1 on arrival. Pt provided stedy to allow static standing for peri care. pt placing LT UE on stedy handle with RT ue without cues. Pt min (A) to complete static standing. Pt was able to perform hip flexion using stedy bar to help with peri care. Pt transfered to sink level. Pt using RT UE to place tooth paste in Lt hand . Pt with no lt hand grasp but was able to use bar and position of hand to maintain paste in lt hand. Pt opening tooth paste with Rt UE. Pt removing paste with Rt ue and applying it to tooth brush on counter surface. Pt demonstrates good problem solving without cues this session from therapist. Pt performs oral care using a cup. Pt with LT UE on bar of stedy throughout session for weight bearing Pt demonstrates self awareness by using RT UE to maintain lt ue in position when leaning forward x2 to spit into sink . Pt using RT UE to ensure LT UE stayed weight bearing was great problem solving. Pt static standing in stedy to locate cell phone in room. pt required x4 attempts to unlock cell phone. Pt applying all  the correct numbers but in the wrong sequence. Pt required incr time to recall sequence. Pt returned to supine. Pt demonstrated ability to lift bil LE into the bed this session. Pt self reports seeing digits move this AM. Pt was unable to return demo at this time. Pt did demonstrates ability to move elbow into flexion with gravity eliminated using some accessory muscle movements. Pt demonstrates tone with  elbow extension.    OT Diagnosis:    OT Problem List:   OT Treatment Interventions:     OT Goals(current goals can now be found in the care plan section) Acute Rehab OT Goals Patient Stated Goal: to get better OT Goal Formulation: With patient/family Time For Goal Achievement: 03/12/13 Potential to Achieve Goals: Good ADL Goals Pt Will Perform Grooming: with set-up;sitting Pt Will Perform Upper Body Bathing: with set-up;sitting Pt Will Perform Lower Body Bathing: with min assist;sit to/from stand;with adaptive equipment Pt Will Perform Upper Body Dressing: with set-up;sitting Pt Will Perform Lower Body Dressing: with min assist;sit to/from stand;with adaptive equipment Pt Will Transfer to Toilet: with min assist;bedside commode  Visit Information  Last OT Received On: 03/02/13 Assistance Needed: +1 History of Present Illness: 62 y.o. female with a hx of DM and migraine who presents with left-sided weakness that was present on awakening. She states that she was normal when she went to bed last night, but when she awoke at 5 AM she was dizzy, and was dropping things from her left hand. She went out to her car, but fell on her way and was unable to get back up. When her daughter had not heard from her by mid morning she called the [facility] Department who responded and found her next to her car and called EMS. NIHSS: 7. 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    Subjective Data      Prior Functioning       Cognition  Cognition Arousal/Alertness: Awake/alert Behavior During Therapy: WFL for tasks assessed/performed Overall Cognitive Status: Within Functional Limits for tasks assessed Memory: Decreased short-term memory    Mobility  Bed Mobility Sit to Supine: 4: Min guard;HOB flat Details for Bed Mobility Assistance: Pt was able to transfer down onto Rt elbow and lift bil LE into the bed at the same time.  Pt using bed rails to help center and reposition in the bed.  Transfers Transfers: Sit to Stand;Stand to Sit Sit to Stand: 4: Min assist;With upper extremity assist;From chair/3-in-1 Stand to Sit: 4: Min assist;With upper extremity assist;To bed Transfer via Lift Equipment: Stedy Details for Transfer Assistance: Pt demonstrates incr progression with transfers using the stedy. Pt demonstrates incr movement in LT LE during session. Question ability to complete transfer without stedy next session with therapist/ tech assistance    Exercises  Other Exercises Other Exercises: OT PROM digit flexion / extension, elbow AAROM flexion / PROM extension, PROM abduction and adduction   Balance     End of Session OT - End of Session Activity Tolerance: Patient tolerated treatment well Patient left: in bed;with call bell/phone within reach Nurse Communication: Mobility status;Precautions  GO     Harolyn Rutherford 03/02/2013, 9:21 AM Pager: [phone]

## 2013-03-02 NOTE — Progress Notes (Signed)
  Echocardiogram Echocardiogram Transesophageal has been performed.  Georgian Co 03/02/2013, 2:55 PM

## 2013-03-02 NOTE — Progress Notes (Addendum)
*  Preliminary Results* Bilateral lower extremity venous duplex completed. The right lower extremity is negative for deep vein thrombosis. The left lower extremity is positive for deep vein thrombosis involving the mid segment of the peroneal vein. There is no evidence of Baker's cyst bilaterally.  Preliminary results discussed with RN Marylene Land.  03/02/2013  Gertie Fey, RVT, RDCS, RDMS

## 2013-03-02 NOTE — Progress Notes (Signed)
RT Note:  Patient is currently on her home CPAP machine.  Patient requested RT to adjust head strap as it was too loose. Strap adjusted and patient placed mask on.  RN in room. Pt. Tolerating well.

## 2013-03-02 NOTE — Progress Notes (Signed)
ANTICOAGULATION CONSULT NOTE - Initial Consult  Pharmacy Consult for heparin Indication: DVT with CVA  Allergies  Allergen Reactions  . Penicillins Other (See Comments)    Unknown reaction    Patient Measurements: Height: 5\' 7"  (170.2 cm) Weight: 267 lb (121.11 kg) IBW/kg (Calculated) : 61.6 Heparin Dosing Weight: 90kg  Vital Signs: Temp: 98 F (36.7 C) (12/02 1823) Temp src: Oral (12/02 1823) BP: 146/40 mmHg (12/02 1823) Pulse Rate: 73 (12/02 1823)  Labs: No results found for this basename: HGB, HCT, PLT, APTT, LABPROT, INR, HEPARINUNFRC, CREATININE, CKTOTAL, CKMB, TROPONINI,  in the last 72 hours  Estimated Creatinine Clearance: 91.4 ml/min (by C-G formula based on Cr of 0.86).   Medical History: Past Medical History  Diagnosis Date  . Diabetes mellitus without complication   . Migraine   . Sleep apnea   . Mitral valve prolapse     Medications:  Prescriptions prior to admission  Medication Sig Dispense Refill  . atorvastatin (LIPITOR) 80 MG tablet Take 80 mg by mouth daily at 6 PM.      . fluticasone (FLONASE) 50 MCG/ACT nasal spray Place 1 spray into both nostrils daily as needed for allergies or rhinitis.      . Fluticasone-Salmeterol (ADVAIR) 100-50 MCG/DOSE AEPB Inhale 1 puff into the lungs 2 (two) times daily.      . hydrochlorothiazide (HYDRODIURIL) 25 MG tablet Take 25 mg by mouth daily.      . insulin lispro (HUMALOG) 100 UNIT/ML injection Inject into the skin 3 (three) times daily before meals. Sliding scale      . propranolol ER (INDERAL LA) 160 MG SR capsule Take 160 mg by mouth daily.      . Topiramate ER (TROKENDI XR) 100 MG CP24 Take 100 mg by mouth daily.      . Topiramate ER (TROKENDI XR) 50 MG CP24 Take 50 mg by mouth daily.       Scheduled:  . aspirin  300 mg Rectal Daily   Or  . aspirin  325 mg Oral Daily  . atorvastatin  80 mg Oral q1800  . heparin  5,000 Units Subcutaneous Q8H  .  HYDROmorphone (DILAUDID) injection  1 mg Intravenous Once   . insulin aspart  0-5 Units Subcutaneous QHS  . insulin aspart  0-9 Units Subcutaneous TID WC  . lidocaine  15 mL Mouth/Throat Once  . midazolam  4 mg Intravenous Once  . pramipexole  0.125 mg Oral BID    Assessment: 62 yo female with LLE DVT to begin heparin (also noted with CVA).  Patient noted on sq heparin and last dose was given at 5pm today.  Goal of Therapy:  Heparin level= 0.3-0.5 Monitor platelets by anticoagulation protocol: Yes   Plan:  -Discontinue sq heparin -No heparin bolus due to CVA -Will start heparin at 900 units/hr (~ 10 units/kg/hr) due to recent sq heparin -Heparin level in 6 hours and daily wth CBC daily  Harland German, Pharm D 03/02/2013 7:02 PM

## 2013-03-02 NOTE — H&P (View-Only) (Signed)
Stroke Team Progress Note  HISTORY  Patricia Black is a 62 y.o. female with a history of diabetes and migraines who presented with left-sided weakness that she noted on awakening on 02/24/2013. She stated that she was normal when she went to bed the night before but when she awoke at 5 AM she was dizzy, and was dropping things from her left hand. She went out to her car, but fell on her way and was unable to get back up. When her daughter had not heard from her by mid morning she called the [employer] who responded and found her next to her car and called EMS. NIHSS: 7. tPA was not given due to outside of window .   SUBJECTIVE Impact study completed. For device removal today. Follows commands and is able to move right side some today. Daughter in room.  OBJECTIVE Most recent Vital Signs: Filed Vitals:   02/28/13 1417 02/28/13 1849 02/28/13 2212 03/01/13 0624  BP: 139/46 127/45 147/45 102/69  Pulse: 68 65 58 66  Temp: 98.1 F (36.7 C) 98 F (36.7 C) 97.4 F (36.3 C) 98 F (36.7 C)  TempSrc: Oral Oral Axillary Oral  Resp: 18 18 20 18  Height:      Weight:      SpO2: 97% 97% 100% 98%   CBG (last 3)   Recent Labs  02/28/13 1615 02/28/13 2158 03/01/13 0650  GLUCAP 284* 288* 295*    IV Fluid Intake:   . sodium chloride 100 mL/hr at 02/25/13 0437    MEDICATIONS  . aspirin  300 mg Rectal Daily   Or  . aspirin  325 mg Oral Daily  . atorvastatin  80 mg Oral q1800  . heparin  5,000 Units Subcutaneous Q8H  .  HYDROmorphone (DILAUDID) injection  1 mg Intravenous Once  . insulin aspart  0-5 Units Subcutaneous QHS  . insulin aspart  0-9 Units Subcutaneous TID WC  . midazolam  4 mg Intravenous Once  . pramipexole  0.125 mg Oral BID   PRN:  acetaminophen, acetaminophen, lidocaine  Diet:  Carb Control  Activity: Ambulated DVT Prophylaxis: Heparin Sq  CLINICALLY SIGNIFICANT STUDIES Basic Metabolic Panel:   Recent Labs Lab 02/24/13 1429 02/24/13 1513  02/25/13 0803  NA 140 141 137  K 3.7 3.4* 3.7  CL 106 107 102  CO2 21  --  20  GLUCOSE 131* 134* 247*  BUN 19 19 17  CREATININE 0.80 1.00 0.86  CALCIUM 9.3  --  8.4   Liver Function Tests:   Recent Labs Lab 02/24/13 1429 02/25/13 0803  AST 18 21  ALT 15 13  ALKPHOS 139* 120*  BILITOT 0.5 0.5  PROT 7.1 6.1  ALBUMIN 3.5 3.0*   CBC:   Recent Labs Lab 02/24/13 1429 02/24/13 1513 02/25/13 0803  WBC 12.6*  --  8.8  NEUTROABS 10.2*  --  6.3  HGB 13.5 13.6 11.4*  HCT 39.2 40.0 33.5*  MCV 85.2  --  85.0  PLT 241  --  205   Coagulation:   Recent Labs Lab 02/24/13 1429  LABPROT 13.3  INR 1.03   Cardiac Enzymes:   Recent Labs Lab 02/24/13 1429  CKTOTAL 248*  TROPONINI <0.30   Urinalysis:   Recent Labs Lab 02/24/13 2307  COLORURINE YELLOW  LABSPEC 1.022  PHURINE 5.5  GLUCOSEU NEGATIVE  HGBUR NEGATIVE  BILIRUBINUR NEGATIVE  KETONESUR 40*  PROTEINUR NEGATIVE  UROBILINOGEN 0.2  NITRITE NEGATIVE  LEUKOCYTESUR NEGATIVE   Lipid Panel      Component Value Date/Time   CHOL 118 02/25/2013 0536   TRIG 108 02/25/2013 0536   HDL 35* 02/25/2013 0536   CHOLHDL 3.4 02/25/2013 0536   VLDL 22 02/25/2013 0536   LDLCALC 61 02/25/2013 0536   HgbA1C  Lab Results  Component Value Date   HGBA1C 7.2* 02/25/2013    Urine Drug Screen:     Component Value Date/Time   LABOPIA POSITIVE* 02/24/2013 2307   COCAINSCRNUR NONE DETECTED 02/24/2013 2307   LABBENZ POSITIVE* 02/24/2013 2307   AMPHETMU NONE DETECTED 02/24/2013 2307   THCU NONE DETECTED 02/24/2013 2307   LABBARB NONE DETECTED 02/24/2013 2307    Alcohol Level:   Recent Labs Lab 02/24/13 1429  ETH <11    Dg Chest 2 View 02/24/2013    No acute findings.     Ct Head Wo Contrast 02/24/2013    Dense right middle cerebral artery as described.  Area of vague decreased attenuation in the right basal ganglia laterally suspicious for acute ischemia.   02/28/2013 Evolving right MCA infarcts, grossly  stable since  diffusion-weighted imaging on 02/26/2013. There is petechial  hemorrhage in the right basal ganglia, but no mass effect.  2. No new intracranial abnormality identified.  3. Metallic lead in place for right sphenopalatine ganglion  stimulation, tracking from the hard palate to the right  pterygopalatine fossa.  4. Otherwise stable face CT.   Ct Maxillofacial Wo Cm 02/25/2013    1. The preprocedure and detail in of anatomy for identification of the sphenopalatine ganglion. 2. Hyperdense right MCA with hypo attenuation in the right lentiform nucleus and insular cortex compatible with a right MCA territory infarct.     MRI Head 02/26/2013 1. MRI discontinued prior to completion due to patient condition.  2. Acute infarcts in the right basal ganglia and posterior right MCA territory. Associated edema without mass effect. No definite associated hemorrhage.  MRA Head 02/26/2013 Right MCA occluded just beyond its origin. Minimal if any reconstituted right MCA flow. Dominant appearing distal right vertebral artery. No normal distal left vertebral artery identified.  2-D Echo - ejection fraction 65-70%. No cardiac source of emboli was identified.  Doppler Carotid Dopplers completed.  Preliminary report: There is 1-39% ICA stenosis. Right vertebral artery flow is antegrade. Left vertebral artery flow not insonated   EKG: sinus rhythm, normal axis, normal R wave progression, T wave flattening in aVL and V3, no obvious ischemia change.   Therapy Recommendations - CIR   Physical Exam:   Filed Vitals:   02/28/13 1417 02/28/13 1849 02/28/13 2212 03/01/13 0624  BP: 139/46 127/45 147/45 102/69  Pulse: 68 65 58 66  Temp: 98.1 F (36.7 C) 98 F (36.7 C) 97.4 F (36.3 C) 98 F (36.7 C)  TempSrc: Oral Oral Axillary Oral  Resp: 18 18 20 18  Height:      Weight:      SpO2: 97% 97% 100% 98%    General: Not in acute distress HEENT: PERRL, EOMI, no scleral icterus, No JVD or  bruit Cardiac: S1/S2, RRR, No murmurs, gallops or rubs Pulm: Good air movement bilaterally. Clear to auscultation bilaterally. No rales, wheezing, rhonchi or rubs. Abd: Soft, nondistended, nontender, no rebound pain, no organomegaly, BS present Ext: No edema. 2+DP/PT pulse bilaterally Musculoskeletal: No joint deformities, erythema, or stiffness, ROM full Skin: No rashes.  Psych: Patient is not psychotic, no suicidal or hemocidal ideation.  Mental Status: Patient is awake, alert, oriented to person, place, month, year, and situation. Immediate and remote memory   are intact. Patient is able to give a clear and coherent history. No signs of aphasia or neglect.   Cranial Nerves: II: Visual Fields are full. Pupils are equal, round, and reactive to light.  Discs are difficult to visualize. III,IV, VI: EOMI without ptosis or diploplia.  partial left hemianopsia V: Facial sensation is symmetric to temperature VII: Facial movement is decreased on the left with dysarthria. VIII: hearing is intact to voice X: Uvula elevates symmetrically XI: Shoulder shrug is symmetric. XII: tongue is midline without atrophy or fasciculations.   Motor: Tone is normal. Bulk is normal. 5/5 strength was present in right arm and leg. She has 1/5 weakness of the left arm and 3/5 of left leg Sensory: Sensation is symmetric to pin in the arms and legs. Deep Tendon Reflexes: 2+ and symmetric in the biceps and patellae.   Cerebellar: FNF and HKS are intact on right, difficult to commensurate with weakness on left Gait: Unable to test due to weakness  ASSESSMENT Ms. Patricia Black is a 62 y.o. female presenting with left side weakness. tPA was not given due to outside of window. MRI revealed acute infarcts in the right basal ganglia and posterior right MCA territory. 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 Not on anticoagulation prior to admission. Now on aspirin 325 mg orally  every day for secondary stroke prevention. Patient with resultant left side weakness. Work up underway.    Diabetes Mellitus, HGB A1C  7.2, goal < 6.5  Hyperlipidemia, LDL: 61, at goal < 70 in diabetics, on lipitor statin.   Patient was out of the window for acute intervention but did meet criteria for IMPACT STUDY.  Obstructive sleep apnea: on bipap at home.  Hospital day # 5  Weakness of the left arm and leg  Is stable from yesterday.  TREATMENT/PLAN  Continue aspirin 325 mg orally every day for secondary stroke prevention.  Day # 5  IMPACT-24 completed on 02/28/2013. Device for sphenopalatine ganglion stimulation to be removed today. TEE to look for embolic source. I have arranged for 03/02/2013. If positive for PFO (patent foramen ovale), check bilateral lower extremity venous dopplers to rule out DVT as possible source of stroke.   Right shoulder injury at time of her stroke. Check films.  Anticipate that the patient will be ready for CIR sometime tomorrow after TEE (if done early enough). Await TEE tomorrow plus IMPACT study hardware removal today.  Patient should follow up in office in 2 months. Research will call for follow up appointment.  Dr. Sethi discussed plan of care with patient and family (daughter)  Lynn D. Brown, PAC, MBA, MHA [facility] Pager: [phone] 03/01/2013 2:09 PM  I have personally obtained a history, examined the patient, evaluated imaging results, and formulated the assessment and plan of care. I agree with the above. Pramod Sethi, MD 

## 2013-03-02 NOTE — Interval H&P Note (Signed)
History and Physical Interval Note:  03/02/2013 2:09 PM  [name]  has presented today for surgery, with the diagnosis of STROKE  The various methods of treatment have been discussed with the patient and family. After consideration of risks, benefits and other options for treatment, the patient has consented to  Procedure(s): TRANSESOPHAGEAL ECHOCARDIOGRAM (TEE) (N/A) as a surgical intervention .  The patient's history has been reviewed, patient examined, no change in status, stable for surgery.  I have reviewed the patient's chart and labs.  Questions were answered to the patient's satisfaction.     Olga Millers

## 2013-03-02 NOTE — CV Procedure (Signed)
See full report in [employer]; normal LV function; mild atherosclerosis descending aorta; positive saline microcavitation study c/w PFO. Olga Millers

## 2013-03-02 NOTE — Progress Notes (Signed)
Paged on call MD to make aware of le doppler results.

## 2013-03-02 NOTE — Progress Notes (Signed)
Stroke Team Progress Note  HISTORY  Patricia Black is a 62 y.o. female with a history of diabetes and migraines who presented with left-sided weakness that she noted on awakening on 02/24/2013. She stated that she was normal when she went to bed the night before but when she awoke at 5 AM she was dizzy, and was dropping things from her left hand. She went out to her car, but fell on her way and was unable to get back up. When her daughter had not heard from her by mid morning she called the [facility] Department who responded and found her next to her car and called EMS. NIHSS: 7. tPA was not given due to outside of window .   SUBJECTIVE Impact study completed. Device removal 03/01/2013.   Patient for TEE today. No new symptoms. Slowly improving   OBJECTIVE Most recent Vital Signs: Filed Vitals:   03/01/13 2207 03/02/13 0213 03/02/13 0218 03/02/13 0445  BP: 122/43  126/38 128/49  Pulse: 68 68  68  Temp: 97.7 F (36.5 C) 98 F (36.7 C)  97.9 F (36.6 C)  TempSrc: Axillary Oral  Oral  Resp:  18  20  Height:      Weight:      SpO2: 95% 97%  96%   CBG (last 3)   Recent Labs  03/01/13 1625 03/01/13 2209 03/02/13 0453  GLUCAP 252* 261* 295*    IV Fluid Intake:   . sodium chloride 100 mL/hr at 02/25/13 0437  . sodium chloride      MEDICATIONS  . aspirin  300 mg Rectal Daily   Or  . aspirin  325 mg Oral Daily  . atorvastatin  80 mg Oral q1800  . heparin  5,000 Units Subcutaneous Q8H  .  HYDROmorphone (DILAUDID) injection  1 mg Intravenous Once  . insulin aspart  0-5 Units Subcutaneous QHS  . insulin aspart  0-9 Units Subcutaneous TID WC  . lidocaine  15 mL Mouth/Throat Once  . midazolam  4 mg Intravenous Once  . pramipexole  0.125 mg Oral BID   PRN:  acetaminophen, acetaminophen, lidocaine  Diet:  NPO  Activity: Ambulated DVT Prophylaxis: Heparin Sq  CLINICALLY SIGNIFICANT STUDIES Basic Metabolic Panel:   Recent Labs Lab 02/24/13 1429 02/24/13 1513  02/25/13 0803  NA 140 141 137  K 3.7 3.4* 3.7  CL 106 107 102  CO2 21  --  20  GLUCOSE 131* 134* 247*  BUN 19 19 17   CREATININE 0.80 1.00 0.86  CALCIUM 9.3  --  8.4   Liver Function Tests:   Recent Labs Lab 02/24/13 1429 02/25/13 0803  AST 18 21  ALT 15 13  ALKPHOS 139* 120*  BILITOT 0.5 0.5  PROT 7.1 6.1  ALBUMIN 3.5 3.0*   CBC:   Recent Labs Lab 02/24/13 1429 02/24/13 1513 02/25/13 0803  WBC 12.6*  --  8.8  NEUTROABS 10.2*  --  6.3  HGB 13.5 13.6 11.4*  HCT 39.2 40.0 33.5*  MCV 85.2  --  85.0  PLT 241  --  205   Coagulation:   Recent Labs Lab 02/24/13 1429  LABPROT 13.3  INR 1.03   Cardiac Enzymes:   Recent Labs Lab 02/24/13 1429  CKTOTAL 248*  TROPONINI <0.30   Urinalysis:   Recent Labs Lab 02/24/13 2307  COLORURINE YELLOW  LABSPEC 1.022  PHURINE 5.5  GLUCOSEU NEGATIVE  HGBUR NEGATIVE  BILIRUBINUR NEGATIVE  KETONESUR 40*  PROTEINUR NEGATIVE  UROBILINOGEN 0.2  NITRITE NEGATIVE  LEUKOCYTESUR NEGATIVE   Lipid Panel    Component Value Date/Time   CHOL 118 02/25/2013 0536   TRIG 108 02/25/2013 0536   HDL 35* 02/25/2013 0536   CHOLHDL 3.4 02/25/2013 0536   VLDL 22 02/25/2013 0536   LDLCALC 61 02/25/2013 0536   HgbA1C  Lab Results  Component Value Date   HGBA1C 7.2* 02/25/2013    Urine Drug Screen:     Component Value Date/Time   LABOPIA POSITIVE* 02/24/2013 2307   COCAINSCRNUR NONE DETECTED 02/24/2013 2307   LABBENZ POSITIVE* 02/24/2013 2307   AMPHETMU NONE DETECTED 02/24/2013 2307   THCU NONE DETECTED 02/24/2013 2307   LABBARB NONE DETECTED 02/24/2013 2307    Alcohol Level:   Recent Labs Lab 02/24/13 1429  ETH <11    Dg Chest 2 View 02/24/2013    No acute findings.     Ct Head Wo Contrast 02/24/2013    Dense right middle cerebral artery as described.  Area of vague decreased attenuation in the right basal ganglia laterally suspicious for acute ischemia.   02/28/2013 Evolving right MCA infarcts, grossly  stable since  diffusion-weighted imaging on 02/26/2013. There is petechial  hemorrhage in the right basal ganglia, but no mass effect.  2. No new intracranial abnormality identified.  3. Metallic lead in place for right sphenopalatine ganglion  stimulation, tracking from the hard palate to the right  pterygopalatine fossa.  4. Otherwise stable face CT.   Ct Maxillofacial Wo Cm 02/25/2013    1. The preprocedure and detail in of anatomy for identification of the sphenopalatine ganglion. 2. Hyperdense right MCA with hypo attenuation in the right lentiform nucleus and insular cortex compatible with a right MCA territory infarct.     MRI Head 02/26/2013 1. MRI discontinued prior to completion due to patient condition.  2. Acute infarcts in the right basal ganglia and posterior right MCA territory. Associated edema without mass effect. No definite associated hemorrhage.  MRA Head 02/26/2013 Right MCA occluded just beyond its origin. Minimal if any reconstituted right MCA flow. Dominant appearing distal right vertebral artery. No normal distal left vertebral artery identified.  2-D Echo - ejection fraction 65-70%. No cardiac source of emboli was identified.  Doppler Carotid Dopplers completed.  Preliminary report: There is 1-39% ICA stenosis. Right vertebral artery flow is antegrade. Left vertebral artery flow not insonated   EKG: sinus rhythm, normal axis, normal R wave progression, T wave flattening in aVL and V3, no obvious ischemia change.   Therapy Recommendations - CIR   Physical Exam:   Filed Vitals:   03/01/13 2207 03/02/13 0213 03/02/13 0218 03/02/13 0445  BP: 122/43  126/38 128/49  Pulse: 68 68  68  Temp: 97.7 F (36.5 C) 98 F (36.7 C)  97.9 F (36.6 C)  TempSrc: Axillary Oral  Oral  Resp:  18  20  Height:      Weight:      SpO2: 95% 97%  96%    General: Not in acute distress HEENT: PERRL, EOMI, no scleral icterus, No JVD or bruit Cardiac: S1/S2, RRR, No  murmurs, gallops or rubs Pulm: Good air movement bilaterally. Clear to auscultation bilaterally. No rales, wheezing, rhonchi or rubs. Abd: Soft, nondistended, nontender, no rebound pain, no organomegaly, BS present Ext: No edema. 2+DP/PT pulse bilaterally Musculoskeletal: No joint deformities, erythema, or stiffness, ROM full Skin: No rashes.  Psych: Patient is not psychotic, no suicidal or hemocidal ideation.  Mental Status: Patient is awake, alert, oriented to person, place, month, year,  and situation. Immediate and remote memory are intact. Patient is able to give a clear and coherent history. No signs of aphasia or neglect.   Cranial Nerves: II: Visual Fields are full. Pupils are equal, round, and reactive to light.  Discs are difficult to visualize. III,IV, VI: EOMI without ptosis or diploplia.  partial left hemianopsia V: Facial sensation is symmetric to temperature VII: Facial movement is decreased on the left with dysarthria. VIII: hearing is intact to voice X: Uvula elevates symmetrically XI: Shoulder shrug is symmetric. XII: tongue is midline without atrophy or fasciculations.   Motor: Tone is normal. Bulk is normal. 5/5 strength was present in right arm and leg. She has 1/5 weakness of the left arm and 3/5 of left leg Sensory: Sensation is symmetric to pin in the arms and legs. Deep Tendon Reflexes: 2+ and symmetric in the biceps and patellae.   Cerebellar: FNF and HKS are intact on right, difficult to commensurate with weakness on left Gait: Unable to test due to weakness  ASSESSMENT Ms. Payzlee Ryder is a 62 y.o. female presenting with left side weakness. tPA was not given due to outside of window. MRI revealed acute infarcts in the right basal ganglia and posterior right MCA territory. 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 Not on anticoagulation prior to admission. Now on aspirin 325 mg orally every day for secondary stroke  prevention. Patient with resultant left side weakness. Work up underway.    Diabetes Mellitus, HGB A1C  7.2, goal < 6.5  Hyperlipidemia, LDL: 61, at goal < 70 in diabetics, on lipitor statin.   Patient was out of the window for acute intervention but did meet criteria for IMPACT STUDY.  Obstructive sleep apnea: on bipap at home.  Shoulder pain, xray negative for fracture.  Hospital day # 6  Weakness of the left arm and leg  Is stable from yesterday.  TREATMENT/PLAN  Continue aspirin 325 mg orally every day for secondary stroke prevention.  Day # 5  IMPACT-24 completed on 02/28/2013. Device for sphenopalatine ganglion stimulation removed 03/01/2013 TEE to look for embolic source. I have arranged for 03/02/2013. If positive for PFO (patent foramen ovale), check bilateral lower extremity venous dopplers to rule out DVT as possible source of stroke.   CIR: able to transfer when benefit issue clears.  Patient should follow up in office in 2 months. Research will call for follow up appointment.  Dr. Pearlean Brownie discussed plan of care with patient and family (daughter)  Gwendolyn Lima. Manson Passey, [facility], MBA, MHA [facility] Pager: [phone] 03/02/2013 12:05 PM  I have personally obtained a history, examined the patient, evaluated imaging results, and formulated the assessment and plan of care. I agree with the above. Delia Heady, MD

## 2013-03-02 NOTE — Progress Notes (Signed)
PT Cancellation Note  Patient Details Name: Patricia Black MRN: [id number] DOB: 12/28/1950   Cancelled Treatment:    Reason Eval/Treat Not Completed: Patient at procedure or test/unavailable.  Patient going down for TEE. Will follow up as able.    Fredrich Birks 03/02/2013, 1:16 PM

## 2013-03-02 NOTE — Progress Notes (Signed)
UR complete.  Trisha Ken RN, MSN 

## 2013-03-02 NOTE — Progress Notes (Signed)
Speech Language Pathology Treatment: Dysphagia;Cognitive-Linquistic  Patient Details Name: Patricia Black MRN: [id number] DOB: [birth year] Today's Date: 03/02/2013 Time: 4098-1191 SLP Time Calculation (min): 33 min  Assessment / Plan / Recommendation Clinical Impression  Pt. Continues to exhibit a mild dysarthria, due to weakness and decreased mobility of left articulators.  Pt. Quickly understood and demonstrated use of compensatory strategies for improving quality and intelligibility of speech, and agrees that she will follow these instructions independently without further formal ST.  Pt. Is tolerating a regular diet without s/s of aspiration, even when using straws with thin liquids.  D/C ST.   HPI HPI: Patricia Black is a 62 y.o. female with a history of diabetes and migraine who presents with left-sided weakness that was present on awakening. She states that she was normal when she went to bed last night, but when she awoke at 5 AM she was dizzy, and was dropping things from her left hand. She went out to her car, but fell on her way and was unable to get back up. When her daughter had not heard from her by mid morning she called the [facility] who responded and found her next to her car and called EMS. Pt failed stroke swallow screen on straw sip.   Pertinent Vitals Lung sounds clear; pt. afebrile  SLP Plan  Discharge SLP treatment due to (comment);All goals met    Recommendations Diet recommendations: Regular;Thin liquid Liquids provided via: Cup;Straw Medication Administration: Whole meds with puree Supervision: Intermittent supervision to cue for compensatory strategies Compensations: Slow rate;Small sips/bites Postural Changes and/or Swallow Maneuvers: Seated upright 90 degrees              Follow up Recommendations: 24 hour supervision/assistance Plan: Discharge SLP treatment due to (comment);All goals met    GO     Maryjo Rochester T 03/02/2013, 11:09 AM

## 2013-03-02 NOTE — Progress Notes (Signed)
Inpatient Diabetes Program Recommendations  AACE/ADA: New Consensus Statement on Inpatient Glycemic Control (2013)  Target Ranges:  Prepandial:   less than 140 mg/dL      Peak postprandial:   less than 180 mg/dL (1-2 hours)      Critically ill patients:  140 - 180 mg/dL   Hyperglycemia in patient who is documented as not eating.  Inpatient Diabetes Program Recommendations Insulin - Basal: please add basal Lantus or Levemir 25 units  May also need to change correction to q 4hrs if continues not eating. Thank you, Lenor Coffin, RN, CNS, Diabetes Coordinator [phone])

## 2013-03-03 ENCOUNTER — Encounter (HOSPITAL_COMMUNITY): Payer: Self-pay | Admitting: Cardiology

## 2013-03-03 ENCOUNTER — Inpatient Hospital Stay (HOSPITAL_COMMUNITY)
Admission: RE | Admit: 2013-03-03 | Discharge: 2013-03-20 | DRG: 945 | Disposition: A | Discharge status: Home / Self Care | Payer: Managed Care, Other (non HMO) | Source: Intra-hospital | Attending: Physical Medicine & Rehabilitation | Admitting: Physical Medicine & Rehabilitation

## 2013-03-03 DIAGNOSIS — E119 Type 2 diabetes mellitus without complications: Secondary | ICD-10-CM

## 2013-03-03 DIAGNOSIS — G43909 Migraine, unspecified, not intractable, without status migrainosus: Secondary | ICD-10-CM

## 2013-03-03 DIAGNOSIS — I82459 Acute embolism and thrombosis of unspecified peroneal vein: Secondary | ICD-10-CM

## 2013-03-03 DIAGNOSIS — I639 Cerebral infarction, unspecified: Secondary | ICD-10-CM | POA: Diagnosis present

## 2013-03-03 DIAGNOSIS — E876 Hypokalemia: Secondary | ICD-10-CM

## 2013-03-03 DIAGNOSIS — Z794 Long term (current) use of insulin: Secondary | ICD-10-CM

## 2013-03-03 DIAGNOSIS — G8190 Hemiplegia, unspecified affecting unspecified side: Secondary | ICD-10-CM

## 2013-03-03 DIAGNOSIS — Z9641 Presence of insulin pump (external) (internal): Secondary | ICD-10-CM

## 2013-03-03 DIAGNOSIS — I6340 Cerebral infarction due to embolism of unspecified cerebral artery: Secondary | ICD-10-CM

## 2013-03-03 DIAGNOSIS — K1370 Unspecified lesions of oral mucosa: Secondary | ICD-10-CM

## 2013-03-03 DIAGNOSIS — I63511 Cerebral infarction due to unspecified occlusion or stenosis of right middle cerebral artery: Secondary | ICD-10-CM

## 2013-03-03 DIAGNOSIS — E1165 Type 2 diabetes mellitus with hyperglycemia: Secondary | ICD-10-CM

## 2013-03-03 DIAGNOSIS — Z5189 Encounter for other specified aftercare: Principal | ICD-10-CM

## 2013-03-03 DIAGNOSIS — I059 Rheumatic mitral valve disease, unspecified: Secondary | ICD-10-CM

## 2013-03-03 DIAGNOSIS — G2581 Restless legs syndrome: Secondary | ICD-10-CM

## 2013-03-03 DIAGNOSIS — Q211 Atrial septal defect: Secondary | ICD-10-CM

## 2013-03-03 DIAGNOSIS — I824Z9 Acute embolism and thrombosis of unspecified deep veins of unspecified distal lower extremity: Secondary | ICD-10-CM

## 2013-03-03 DIAGNOSIS — G4733 Obstructive sleep apnea (adult) (pediatric): Secondary | ICD-10-CM

## 2013-03-03 DIAGNOSIS — M25511 Pain in right shoulder: Secondary | ICD-10-CM

## 2013-03-03 DIAGNOSIS — R259 Unspecified abnormal involuntary movements: Secondary | ICD-10-CM

## 2013-03-03 DIAGNOSIS — R29810 Facial weakness: Secondary | ICD-10-CM

## 2013-03-03 DIAGNOSIS — Q2111 Secundum atrial septal defect: Secondary | ICD-10-CM

## 2013-03-03 DIAGNOSIS — Z79899 Other long term (current) drug therapy: Secondary | ICD-10-CM

## 2013-03-03 LAB — CBC
HCT: 34.6 % — ABNORMAL LOW (ref 36.0–46.0)
Hemoglobin: 11.5 g/dL — ABNORMAL LOW (ref 12.0–15.0)
MCH: 28.7 pg (ref 26.0–34.0)
MCHC: 33.2 g/dL (ref 30.0–36.0)
MCV: 86.3 fL (ref 78.0–100.0)
Platelets: 196 10*3/uL (ref 150–400)
RBC: 4.01 MIL/uL (ref 3.87–5.11)
WBC: 7.5 10*3/uL (ref 4.0–10.5)

## 2013-03-03 LAB — PROTIME-INR
INR: 1.07 (ref 0.00–1.49)
Prothrombin Time: 13.7 seconds (ref 11.6–15.2)

## 2013-03-03 LAB — GLUCOSE, CAPILLARY
Glucose-Capillary: 282 mg/dL — ABNORMAL HIGH (ref 70–99)
Glucose-Capillary: 299 mg/dL — ABNORMAL HIGH (ref 70–99)
Glucose-Capillary: 269 mg/dL — ABNORMAL HIGH (ref 70–99)
Glucose-Capillary: 285 mg/dL — ABNORMAL HIGH (ref 70–99)

## 2013-03-03 LAB — HEPARIN LEVEL (UNFRACTIONATED): Heparin Unfractionated: 0.15 IU/mL — ABNORMAL LOW (ref 0.30–0.70)

## 2013-03-03 MED ORDER — FLEET ENEMA 7-19 GM/118ML RE ENEM: 1.0000 | ENEMA | Freq: Once | RECTAL | Status: AC | PRN

## 2013-03-03 MED ORDER — MOMETASONE FURO-FORMOTEROL FUM 100-5 MCG/ACT IN AERO
2.0000 | INHALATION_SPRAY | Freq: Two times a day (BID) | RESPIRATORY_TRACT | Status: DC
  Administered 2013-03-03 – 2013-03-19 (×30): 2 via RESPIRATORY_TRACT
  Filled 2013-03-03: qty 8.8

## 2013-03-03 MED ORDER — ALUM & MAG HYDROXIDE-SIMETH 200-200-20 MG/5ML PO SUSP: 30.0000 mL | ORAL | Status: DC | PRN

## 2013-03-03 MED ORDER — PROCHLORPERAZINE 25 MG RE SUPP
12.5000 mg | Freq: Four times a day (QID) | RECTAL | Status: DC | PRN
  Filled 2013-03-03: qty 1

## 2013-03-03 MED ORDER — LIDOCAINE VISCOUS 2 % MT SOLN
15.0000 mL | Freq: Four times a day (QID) | OROMUCOSAL | Status: DC | PRN
  Filled 2013-03-03: qty 15

## 2013-03-03 MED ORDER — RIVAROXABAN 15 MG PO TABS
15.0000 mg | ORAL_TABLET | Freq: Two times a day (BID) | ORAL | Status: DC
  Administered 2013-03-03: 15 mg via ORAL
  Filled 2013-03-03 (×2): qty 1

## 2013-03-03 MED ORDER — PROCHLORPERAZINE MALEATE 5 MG PO TABS
5.0000 mg | ORAL_TABLET | Freq: Four times a day (QID) | ORAL | Status: DC | PRN
  Filled 2013-03-03: qty 2

## 2013-03-03 MED ORDER — PROPRANOLOL HCL ER 60 MG PO CP24
60.0000 mg | ORAL_CAPSULE | Freq: Every day | ORAL | Status: DC
  Administered 2013-03-04 – 2013-03-20 (×17): 60 mg via ORAL
  Filled 2013-03-03 (×20): qty 1

## 2013-03-03 MED ORDER — BISACODYL 10 MG RE SUPP: 10.0000 mg | Freq: Every day | RECTAL | Status: DC | PRN

## 2013-03-03 MED ORDER — ATORVASTATIN CALCIUM 80 MG PO TABS
80.0000 mg | ORAL_TABLET | Freq: Every day | ORAL | Status: DC
  Administered 2013-03-04 – 2013-03-19 (×16): 80 mg via ORAL
  Filled 2013-03-03 (×17): qty 1

## 2013-03-03 MED ORDER — PRAMIPEXOLE DIHYDROCHLORIDE 0.125 MG PO TABS
0.1250 mg | ORAL_TABLET | Freq: Two times a day (BID) | ORAL | Status: DC
  Administered 2013-03-03 – 2013-03-13 (×21): 0.125 mg via ORAL
  Filled 2013-03-03 (×25): qty 1

## 2013-03-03 MED ORDER — TOPIRAMATE 25 MG PO TABS
50.0000 mg | ORAL_TABLET | Freq: Two times a day (BID) | ORAL | Status: DC
  Administered 2013-03-03 – 2013-03-20 (×34): 50 mg via ORAL
  Filled 2013-03-03 (×37): qty 2

## 2013-03-03 MED ORDER — RIVAROXABAN 15 MG PO TABS
15.0000 mg | ORAL_TABLET | Freq: Two times a day (BID) | ORAL | Status: DC
  Administered 2013-03-04 – 2013-03-20 (×33): 15 mg via ORAL
  Filled 2013-03-03 (×40): qty 1

## 2013-03-03 MED ORDER — ACETAMINOPHEN 325 MG PO TABS
325.0000 mg | ORAL_TABLET | ORAL | Status: DC | PRN
  Administered 2013-03-04 – 2013-03-19 (×14): 650 mg via ORAL
  Filled 2013-03-03 (×15): qty 2

## 2013-03-03 MED ORDER — INSULIN DETEMIR 100 UNIT/ML ~~LOC~~ SOLN
25.0000 [IU] | Freq: Every day | SUBCUTANEOUS | Status: DC
  Administered 2013-03-04 – 2013-03-06 (×3): 25 [IU] via SUBCUTANEOUS
  Filled 2013-03-03 (×3): qty 0.25

## 2013-03-03 MED ORDER — POLYETHYLENE GLYCOL 3350 17 G PO PACK
17.0000 g | PACK | Freq: Every day | ORAL | Status: DC | PRN
  Filled 2013-03-03: qty 1

## 2013-03-03 MED ORDER — METHOCARBAMOL 500 MG PO TABS
500.0000 mg | ORAL_TABLET | Freq: Four times a day (QID) | ORAL | Status: DC | PRN
  Administered 2013-03-09 – 2013-03-15 (×6): 500 mg via ORAL
  Filled 2013-03-03 (×6): qty 1

## 2013-03-03 MED ORDER — PROCHLORPERAZINE EDISYLATE 5 MG/ML IJ SOLN
5.0000 mg | Freq: Four times a day (QID) | INTRAMUSCULAR | Status: DC | PRN
  Filled 2013-03-03: qty 2

## 2013-03-03 MED ORDER — INSULIN ASPART 100 UNIT/ML ~~LOC~~ SOLN
0.0000 [IU] | Freq: Every day | SUBCUTANEOUS | Status: DC
  Administered 2013-03-03: 3 [IU] via SUBCUTANEOUS
  Administered 2013-03-04 – 2013-03-06 (×3): 4 [IU] via SUBCUTANEOUS
  Administered 2013-03-06 – 2013-03-07 (×2): 3 [IU] via SUBCUTANEOUS

## 2013-03-03 MED ORDER — GUAIFENESIN-DM 100-10 MG/5ML PO SYRP
5.0000 mL | ORAL_SOLUTION | Freq: Four times a day (QID) | ORAL | Status: DC | PRN
  Administered 2013-03-05 – 2013-03-09 (×8): 10 mL via ORAL
  Administered 2013-03-10: 5 mL via ORAL
  Filled 2013-03-03 (×10): qty 10

## 2013-03-03 MED ORDER — INSULIN ASPART 100 UNIT/ML ~~LOC~~ SOLN
0.0000 [IU] | Freq: Three times a day (TID) | SUBCUTANEOUS | Status: DC
  Administered 2013-03-04 (×2): 5 [IU] via SUBCUTANEOUS
  Administered 2013-03-04 – 2013-03-05 (×2): 7 [IU] via SUBCUTANEOUS
  Administered 2013-03-05: 5 [IU] via SUBCUTANEOUS
  Administered 2013-03-05: 7 [IU] via SUBCUTANEOUS
  Administered 2013-03-06: 3 [IU] via SUBCUTANEOUS
  Administered 2013-03-06: 7 [IU] via SUBCUTANEOUS
  Administered 2013-03-06: 5 [IU] via SUBCUTANEOUS
  Administered 2013-03-07 (×2): 7 [IU] via SUBCUTANEOUS
  Administered 2013-03-07: 5 [IU] via SUBCUTANEOUS
  Administered 2013-03-08: 7 [IU] via SUBCUTANEOUS
  Administered 2013-03-08: 5 [IU] via SUBCUTANEOUS
  Administered 2013-03-08: 9 [IU] via SUBCUTANEOUS
  Administered 2013-03-09: 5 [IU] via SUBCUTANEOUS

## 2013-03-03 NOTE — H&P (Signed)
Physical Medicine and Rehabilitation Admission H&P    Chief Complaint  Patient presents with  . Facial droop and left sided weakness.    HPI: Patricia Black is a 62 y.o. right handed female history of migraine headaches as well as diabetes mellitus (insulin pump) who was admitted 02/24/2013 with left-sided weakness,dizziness as well as fall on her back porch-- found by Sheriff when family was unable to get in touch with her.   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 MRI /MRA brain showed acute infarcts in the right basal ganglia and posterior right MCA territory with associated edema and occluded R-MCA beyond origin.  Echocardiogram with ejection fraction 70% grade 2 diastolic dysfunction. [name]. Neurology recommended ASA for embolic infarct due to occlusion of R-MCA artery and TEE ordered for workup. TEE revealed PFO and BLE dopplers were positive for L-mid peroneal vein DVT. She was started on xarelto for treatment of DVT. Patient with resultant left hemiparesis, left facial weakness as well as LLE instability with difficulty with mobility. Therapy team recommended CIR and patient admitted today.     Review of Systems  HENT: Negative for hearing loss.   Eyes: Negative for blurred vision and double vision.  Respiratory: Negative for cough and shortness of breath.   Cardiovascular: Negative for chest pain and palpitations.  Gastrointestinal: Negative for nausea, vomiting, abdominal pain and constipation.       Sore mouth and throat  Genitourinary: Negative for dysuria, urgency and frequency.  Musculoskeletal: Negative for myalgias.  Neurological: Positive for focal weakness. Negative for dizziness, tingling and headaches.   Past Medical History  Diagnosis Date  . Diabetes mellitus without complication   . Migraine   . Sleep apnea   . Mitral valve prolapse    Past Surgical History  Procedure Laterality Date   . Abdominal hysterectomy    . Cholecystectomy    . Tee without cardioversion N/A 03/02/2013    Procedure: TRANSESOPHAGEAL ECHOCARDIOGRAM (TEE);  Surgeon: Brian S Crenshaw, MD;  Location: [facility];  Service: Cardiovascular;  Laterality: N/A;   Family History  Problem Relation Age of Onset  . Diabetes Mother   . Cancer Mother     uterine  . Emphysema Father     Social History: Divorced. 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 She reports that she has never smoked. She has never used smokeless tobacco. She reports that she does drinks alcohol--wine or mixed drink once a month. She does not use illicit drugs.   Allergies  Allergen Reactions  . Penicillins Other (See Comments)    Unknown reaction   Medications Prior to Admission  Medication Sig Dispense Refill  . atorvastatin (LIPITOR) 80 MG tablet Take 80 mg by mouth daily at 6 PM.      . fluticasone (FLONASE) 50 MCG/ACT nasal spray Place 1 spray into both nostrils daily as needed for allergies or rhinitis.      . Fluticasone-Salmeterol (ADVAIR) 100-50 MCG/DOSE AEPB Inhale 1 puff into the lungs 2 (two) times daily.      . hydrochlorothiazide (HYDRODIURIL) 25 MG tablet Take 25 mg by mouth daily.      . insulin lispro (HUMALOG) 100 UNIT/ML injection Inject into the skin 3 (three) times daily before meals. Sliding scale      . propranolol ER (INDERAL LA) 160 MG SR capsule Take 160 mg by mouth daily.      . Topiramate ER (  TROKENDI XR) 100 MG CP24 Take 100 mg by mouth daily.      . Topiramate ER (TROKENDI XR) 50 MG CP24 Take 50 mg by mouth daily.        Home: Home Living Family/patient expects to be discharged to:: Inpatient rehab Living Arrangements: Alone Available Help at Discharge: Family Type of Home: House Home Access: Stairs to enter Entrance Stairs-Number of Steps: 4 Home Layout: Two level Home Equipment: None Additional Comments: [place] in [place]; is there an Inpt Rehab Center near family where  pt could go?  Lives With: Alone   Functional History:    Functional Status:  Mobility: Bed Mobility Bed Mobility: Rolling Left;Left Sidelying to Sit;Supine to Sit;Sitting - Scoot to Edge of Bed;Sit to Supine Rolling Right: 4: Min assist;With rail Rolling Left: 4: Min assist;With rail Right Sidelying to Sit: 4: Min assist;With rails Left Sidelying to Sit: 4: Min assist;With rails;HOB elevated Supine to Sit: 3: Mod assist;With rails Sitting - Scoot to Edge of Bed: 3: Mod assist Sit to Supine: 4: Min guard;HOB flat Transfers Transfers: Sit to Stand;Stand to Sit;Stand Pivot Transfers Sit to Stand: 4: Min assist;With upper extremity assist;From chair/3-in-1 Stand to Sit: 4: Min assist;With upper extremity assist;To bed Stand Pivot Transfers: 1: +1 Total assist Stand Pivot Transfers: Patient Percentage: 50% Transfer via Lift Equipment: Stedy Ambulation/Gait Ambulation/Gait Assistance: Not tested (comment)    ADL: ADL Eating/Feeding: Set up Where Assessed - Eating/Feeding: Chair (using Rt UE) Grooming: Wash/dry hands;Wash/dry face;Teeth care;Min guard Where Assessed - Grooming: Supported sitting (sitting on stedy) Upper Body Dressing: Moderate assistance Where Assessed - Upper Body Dressing: Unsupported sitting Toilet Transfer: Minimal assistance Toilet Transfer Method: Sit to stand Toilet Transfer Equipment: Raised toilet seat with arms (or 3-in-1 over toilet);Other (comment) (using stedy) Equipment Used: Other (comment) (stedy) Transfers/Ambulation Related to ADLs: Pt completed sit<>stand in stedy x4 during session ADL Comments: Pt on 3n1 at bedside with 3n1 on arrival. Pt provided stedy to allow static standing for peri care. pt placing LT UE on stedy handle with RT ue without cues. Pt min (A) to complete static standing. Pt was able to perform hip flexion using stedy bar to help with peri care. Pt transfered to sink level. Pt using RT UE to place tooth paste in Lt hand . Pt with  no lt hand grasp but was able to use bar and position of hand to maintain paste in lt hand. Pt opening tooth paste with Rt UE. Pt removing paste with Rt ue and applying it to tooth brush on counter surface. Pt demonstrates good problem solving without cues this session from therapist. Pt performs oral care using a cup. Pt with LT UE on bar of stedy throughout session for weight bearing Pt demonstrates self awareness by using RT UE to maintain lt ue in position when leaning forward x2 to spit into sink . Pt using RT UE to ensure LT UE stayed weight bearing was great problem solving. Pt static standing in stedy to locate cell phone in room. pt required x4 attempts to unlock cell phone. Pt applying all the correct numbers but in the wrong sequence. Pt required incr time to recall sequence. Pt returned to supine. Pt demonstrated ability to lift bil LE into the bed this session. Pt self reports seeing digits move this AM. Pt was unable to return demo at this time. Pt did demonstrates ability to move elbow into flexion with gravity eliminated using some accessory muscle movements. Pt demonstrates tone with elbow   extension.  Cognition: Cognition Overall Cognitive Status: Within Functional Limits for tasks assessed Arousal/Alertness: Awake/alert Orientation Level: Oriented X4 Attention: Selective;Alternating Selective Attention: Appears intact Alternating Attention: Appears intact Memory: Appears intact Awareness: Appears intact Problem Solving: Appears intact Executive Function: Sequencing;Reasoning;Organizing Reasoning: Appears intact Sequencing: Appears intact Organizing: Appears intact Safety/Judgment: Appears intact Cognition Arousal/Alertness: Awake/alert Behavior During Therapy: WFL for tasks assessed/performed Overall Cognitive Status: Within Functional Limits for tasks assessed Memory: Decreased short-term memory  Physical Exam: Blood pressure 129/72, pulse 72, temperature 98 F (36.7 C),  temperature source Oral, resp. rate 18, height 5' 7" (1.702 m), weight 121.11 kg (267 lb), SpO2 94.00%. Physical Exam  Nursing note and vitals reviewed. Constitutional: She is oriented to person, place, and time. She appears well-developed and well-nourished.  HENT:  Head: Normocephalic and atraumatic.  Edentulous  Eyes: Conjunctivae are normal. Pupils are equal, round, and reactive to light.  Neck: Normal range of motion. Neck supple. No tracheal deviation present. No thyromegaly present.  Cardiovascular: Normal rate, regular rhythm and normal heart sounds.   No murmur heard. Respiratory: Effort normal and breath sounds normal. No respiratory distress. She has no wheezes.  GI: Soft. Bowel sounds are normal. She exhibits no distension. There is no tenderness. There is no rebound.  Musculoskeletal: She exhibits no edema and no tenderness.  Neurological: She is alert and oriented to person, place, and time.  Speech clear. Left central 7 and tongue deviation. Speech slightly dysarthric. Follows basic commands without difficulty. Has reasonable insight and awareness of deficits.  Motor strength is 5/5 in the right deltoid, bicep, tricep, grip 5/5 in the right hip flexor knee extensor ankle dorsiflexor and plantar flexion Trace/ 5 left deltoid, bicep, tricep, 0 left finger flexors 2 minus left hip flexor,  2+ to 3 minus left knee extensor, 1/5 in the left ankle dorsiflexor and plantar flexor No clonus left ankle Sensory exam nearly normal  to light touch in the left upper extremity and left lower ext. normal on the right side.     Psychiatric: She has a normal mood and affect. Her behavior is normal. Judgment and thought content normal.    Results for orders placed during the hospital encounter of 02/24/13 (from the past 48 hour(s))  GLUCOSE, CAPILLARY     Status: Abnormal   Collection Time    03/01/13  4:25 PM      Result Value Range   Glucose-Capillary 252 (*) 70 - 99 mg/dL  GLUCOSE,  CAPILLARY     Status: Abnormal   Collection Time    03/01/13 10:09 PM      Result Value Range   Glucose-Capillary 261 (*) 70 - 99 mg/dL  GLUCOSE, CAPILLARY     Status: Abnormal   Collection Time    03/02/13  4:53 AM      Result Value Range   Glucose-Capillary 295 (*) 70 - 99 mg/dL  GLUCOSE, CAPILLARY     Status: Abnormal   Collection Time    03/02/13 11:29 AM      Result Value Range   Glucose-Capillary 286 (*) 70 - 99 mg/dL   Comment 1 Notify RN    GLUCOSE, CAPILLARY     Status: Abnormal   Collection Time    03/02/13  4:04 PM      Result Value Range   Glucose-Capillary 239 (*) 70 - 99 mg/dL  GLUCOSE, CAPILLARY     Status: Abnormal   Collection Time    03/02/13  9:01 PM        Result Value Range   Glucose-Capillary 253 (*) 70 - 99 mg/dL   Comment 1 Documented in Chart     Comment 2 Notify RN    HEPARIN LEVEL (UNFRACTIONATED)     Status: Abnormal   Collection Time    03/03/13  5:03 AM      Result Value Range   Heparin Unfractionated 0.15 (*) 0.30 - 0.70 IU/mL   Comment:            IF HEPARIN RESULTS ARE BELOW     EXPECTED VALUES, AND PATIENT     DOSAGE HAS BEEN CONFIRMED,     SUGGEST FOLLOW UP TESTING     OF ANTITHROMBIN III LEVELS.  CBC     Status: Abnormal   Collection Time    03/03/13  5:03 AM      Result Value Range   WBC 7.5  4.0 - 10.5 K/uL   RBC 4.01  3.87 - 5.11 MIL/uL   Hemoglobin 11.5 (*) 12.0 - 15.0 g/dL   HCT 34.6 (*) 36.0 - 46.0 %   MCV 86.3  78.0 - 100.0 fL   MCH 28.7  26.0 - 34.0 pg   MCHC 33.2  30.0 - 36.0 g/dL   RDW 14.5  11.5 - 15.5 %   Platelets 196  150 - 400 K/uL  PROTIME-INR     Status: None   Collection Time    03/03/13  5:03 AM      Result Value Range   Prothrombin Time 13.7  11.6 - 15.2 seconds   INR 1.07  0.00 - 1.49  GLUCOSE, CAPILLARY     Status: Abnormal   Collection Time    03/03/13  6:23 AM      Result Value Range   Glucose-Capillary 282 (*) 70 - 99 mg/dL   Comment 1 Documented in Chart     Comment 2 Notify RN    GLUCOSE,  CAPILLARY     Status: Abnormal   Collection Time    03/03/13 11:50 AM      Result Value Range   Glucose-Capillary 299 (*) 70 - 99 mg/dL   Comment 1 Documented in Chart     Dg Shoulder Right  03/01/2013   CLINICAL DATA:  Fell, posterior right shoulder pain  EXAM: RIGHT SHOULDER - 2+ VIEW  COMPARISON:  None.  FINDINGS: There is no evidence of fracture or dislocation. There is no evidence of arthropathy or other focal bone abnormality. Soft tissues are unremarkable.  IMPRESSION: Negative.   Electronically Signed   By: Raymond  Rubner M.D.   On: 03/01/2013 15:26    Post Admission Physician Evaluation: 1. Functional deficits secondary  to embolic right MCA infarct. 2. Patient is admitted to receive collaborative, interdisciplinary care between the physiatrist, rehab nursing staff, and therapy team. 3. Patient's level of medical complexity and substantial therapy needs in context of that medical necessity cannot be provided at a lesser intensity of care such as a SNF. 4. Patient has experienced substantial functional loss from his/her baseline which was documented above under the "Functional History" and "Functional Status" headings.  Judging by the patient's diagnosis, physical exam, and functional history, the patient has potential for functional progress which will result in measurable gains while on inpatient rehab.  These gains will be of substantial and practical use upon discharge  in facilitating mobility and self-care at the household level. 5. Physiatrist will provide 24 hour management of medical needs as well as oversight of the therapy plan/treatment and provide   guidance as appropriate regarding the interaction of the two. 6. 24 hour rehab nursing will assist with bladder management, bowel management, safety, skin/wound care, disease management, medication administration, pain management and patient education  and help integrate therapy concepts, techniques,education, etc. 7. PT will assess  and treat for/with: Lower extremity strength, range of motion, stamina, balance, functional mobility, safety, adaptive techniques and equipment, NMR, education, joint mobilization and stability.   Goals are: supervision to minimal assist. 8. OT will assess and treat for/with: ADL's, functional mobility, safety, upper extremity strength, adaptive techniques and equipment, NMR, education, shoulder mobilization and stability.   Goals are: supervision to min assist. 9. SLP will assess and treat for/with: speech intelligibility.  Goals are: mod I. 10. Case Management and Social Worker will assess and treat for psychological issues and discharge planning. 11. Team conference will be held weekly to assess progress toward goals and to determine barriers to discharge. 12. Patient will receive at least 3 hours of therapy per day at least 5 days per week. 13. ELOS: 18-21 days       14. Prognosis:  excellent   Medical Problem List and Plan: 1. DVT left mid peroneal vein/Anticoagulation: Pharmaceutical: Xarelto 2. Pain Management: prn tylenol effective for pain.  3. Mood: Has good outlook and aware of time needed for recovery. Will have LCSW follow for evaluation. 4. Neuropsych: This patient is capable of making decisions on her own behalf. 5. DM type 2--insulin dependent: On insulin pump at home--add levemir for basal insulin and use SSI as needed. Po intake is good.  6. Migraines: will resume Topamax and inderal at lower dose to avoid recurrence. Titrate as needed.  7. Oral pain: due to impact trial probe as well as sore throat from TEE. Continue lidocaine mouth washt--swish and spit. Pain is beginning to improve 8. Hyperactive airway: resume Advair  Khyson Sebesta T. Kaylyne Axton, MD, FAAPMR [facility]   03/03/2013 

## 2013-03-03 NOTE — PMR Pre-admission (Signed)
PMR Admission Coordinator Pre-Admission Assessment  Patient: Patricia Black is an 62 y.o., female MRN: [id number] DOB: 1950/04/13 Height: 5\' 7"  (170.2 cm) Weight: 121.11 kg (267 lb)              Insurance Information HMO:      PPO:       PCP:       IPA:       80/20:       OTHER:  Group # [id number] PRIMARY: [employer] Managed      Policy#: [id number]      Subscriber: Theron Arista CM Name: Dorann Lodge      Phone#: [phone]     Fax#: [phone] Pre-Cert#: [id number] Precert X 1 week with update due 03/10/13 Employer: [employer] Benefits:  Phone #: [phone]     Name: Mateo Flow. 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 She also has left neglect   History of Present Illness: A 62 y.o. right handed female history of migraine headaches as well as diabetes mellitus (insulin pump) who was admitted 02/24/2013 with left-sided weakness,dizziness as well as fall on her back porch-- found by [facility] when family was unable to get in touch with her. 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 MRI /MRA brain showed acute infarcts in the right basal ganglia and posterior right MCA territory with associated edema and occluded R-MCA beyond origin. Echocardiogram with ejection fraction 70% grade 2 diastolic dysfunction. [name]. Neurology recommended ASA for embolic infarct due to  occlusion of R-MCA artery and TEE ordered for workup. TEE revealed PFO and BLE dopplers were positive for L-mid peroneal vein DVT. She was started on xarelto for treatment of DVT. Patient with resultant left hemiparesis, left facial weakness as well as LLE instability with difficulty with mobility. Therapy team recommended CIR and patient admitted will be today.     Total: 8=NIH  Past Medical History  Past Medical History  Diagnosis Date  . Diabetes mellitus without complication   . Migraine   . Sleep apnea   . Mitral valve prolapse     Family History  family history includes Cancer in her mother; Diabetes in her mother; Emphysema in her father.  Prior Rehab/Hospitalizations:  None   Current Medications  Current facility-administered medications:0.9 %  sodium chloride infusion, , Intravenous, Continuous, Ripudeep K Rai, MD, Last Rate: 100 mL/hr at 03/02/13 1342, 500 mL at 03/02/13 1342;  acetaminophen (TYLENOL) suppository 650 mg, 650 mg, Rectal, Q4H PRN, Ripudeep K Rai, MD;  acetaminophen (TYLENOL) tablet 650 mg, 650 mg, Oral, Q4H PRN, Ripudeep K Rai, MD, 650 mg at 03/01/13 2044 atorvastatin (LIPITOR) tablet 80 mg, 80 mg, Oral, q1800, Rhetta Mura, MD, 80 mg at 03/02/13 1702;  HYDROmorphone (DILAUDID) injection 1 mg, 1 mg, Intravenous, Once, Oneal Grout, MD;  insulin aspart (novoLOG) injection 0-5 Units, 0-5 Units, Subcutaneous, QHS, Rhetta Mura, MD, 3 Units at 03/02/13 2110 insulin aspart (novoLOG) injection 0-9 Units, 0-9 Units, Subcutaneous, TID WC, Rhetta Mura, MD, 5 Units at 03/03/13 1156;  lidocaine (XYLOCAINE) 2 % viscous mouth solution 15 mL, 15 mL, Mouth/Throat, Q6H PRN, Leda Gauze, NP, 15 mL at 02/26/13 2341;  lidocaine (XYLOCAINE) 2 % viscous mouth solution 15 mL, 15 mL, Mouth/Throat, Once, Oneal Grout, MD midazolam (VERSED) injection 4 mg, 4 mg, Intravenous, Once, Ripudeep K Rai, MD;  pramipexole (MIRAPEX) tablet 0.125 mg, 0.125 mg,  Oral, BID, York Spaniel, MD, 0.125 mg at 03/03/13 1051;  Rivaroxaban (XARELTO) tablet 15 mg, 15 mg, Oral, BID WC, Cathlyn Parsons, PA-C  Patients Current Diet: Carb Control  Precautions / Restrictions Precautions Precautions: Fall Restrictions Weight Bearing Restrictions: No   Prior Activity Level Community (5-7x/wk): Went out daily.  Worked FT with [employer]. Home Assistive Devices / Equipment Home Assistive Devices/Equipment: CBG Meter;Eyeglasses Home Equipment: None  Prior Functional Level Prior Function Level of Independence: Independent  Current Functional Level Cognition  Arousal/Alertness: Awake/alert Overall Cognitive Status: Within Functional Limits for tasks assessed Orientation Level: Oriented X4 Attention: Selective;Alternating Selective Attention: Appears intact Alternating Attention: Appears intact Memory: Appears intact Awareness: Appears intact Problem Solving: Appears intact Executive Function: Sequencing;Reasoning;Organizing Reasoning: Appears intact Sequencing: Appears intact Organizing: Appears intact Safety/Judgment: Appears intact    Extremity Assessment (includes Sensation/Coordination)          ADLs  Eating/Feeding: Set up Where Assessed - Eating/Feeding: Chair (using Rt UE) Grooming: Wash/dry hands;Wash/dry face;Teeth care;Min guard Where Assessed - Grooming: Supported sitting (sitting on stedy) Upper Body Dressing: Moderate assistance Where Assessed - Upper Body Dressing: Unsupported sitting Toilet Transfer: Minimal assistance Toilet Transfer Method: Sit to stand Toilet Transfer Equipment: Raised toilet seat with arms (or 3-in-1 over toilet);Other (comment) (using stedy) Toileting - Clothing Manipulation and Hygiene: +1 Total assistance Where Assessed - Toileting Clothing Manipulation and Hygiene: Sit to stand from 3-in-1 or toilet Equipment Used: Other (comment) (stedy) Transfers/Ambulation Related to ADLs: Pt completed sit<>stand in stedy  x4 during session ADL Comments: Pt on 3n1 at bedside with 3n1 on arrival. Pt provided stedy to allow static standing for peri care. pt placing LT UE on stedy handle with RT ue without cues. Pt min (A) to complete static standing. Pt was able to perform hip flexion using stedy bar to help with peri care. Pt transfered to sink level. Pt using RT UE to place tooth paste in Lt hand . Pt with no lt hand grasp but was able to use bar and position of hand to maintain paste in lt hand. Pt opening tooth paste with Rt UE. Pt removing paste with Rt ue and applying it to tooth brush on counter surface. Pt demonstrates good problem solving without cues this session from therapist. Pt performs oral care using a cup. Pt with LT UE on bar of stedy throughout session for weight bearing Pt demonstrates self awareness by using RT UE to maintain lt ue in position when leaning forward x2 to spit into sink . Pt using RT UE to ensure LT UE stayed weight bearing was great problem solving. Pt static standing in stedy to locate cell phone in room. pt required x4 attempts to unlock cell phone. Pt applying all the correct numbers but in the wrong sequence. Pt required incr time to recall sequence. Pt  returned to supine. Pt demonstrated ability to lift bil LE into the bed this session. Pt self reports seeing digits move this AM. Pt was unable to return demo at this time. Pt did demonstrates ability to move elbow into flexion with gravity eliminated using some accessory muscle movements. Pt demonstrates tone with elbow extension.    Mobility  Bed Mobility: Rolling Left;Left Sidelying to Sit;Supine to Sit;Sitting - Scoot to [place] of Bed;Sit to Supine Rolling Right: 4: Min assist;With rail Rolling Left: 4: Min assist;With rail Right Sidelying to Sit: 4: Min assist;With rails Left Sidelying to Sit: 4: Min assist;With rails;HOB elevated Supine to Sit: 3: Mod assist;With rails Sitting - Scoot to Edge of Bed: 3: Mod assist Sit to Supine: 4:  Min guard;HOB flat    Transfers  Transfers: Sit to Stand;Stand to [employer] Transfers Sit to Stand: 4: Min assist;With upper extremity assist;From chair/3-in-1 Stand to Sit: 4: Min assist;With upper extremity assist;To bed Stand Pivot Transfers: 1: +1 Total assist Stand Pivot Transfers: Patient Percentage: 50% Transfer via Lift Equipment: Stedy    Ambulation / Gait / Stairs / Psychologist, prison and probation services  Ambulation/Gait Ambulation/Gait Assistance: Not tested (comment)    Posture / Balance Static Sitting Balance Static Sitting - Balance Support: Right upper extremity supported Static Sitting - Level of Assistance: 3: Mod assist;4: Min assist Static Sitting - Comment/# of Minutes: Mod assist progressing to min assist; requiring R UE support; tending to lose balance to L side until held with R UE to foot board    Special needs/care consideration BiPAP/CPAP Yes, CPAP at night CPM No Continuous Drip IV Discontinued Heparin drip.  0.9% NS at 100 ml/hr Dialysis No        Life Vest No Oxygen No Special Bed No Trach Size No Wound Vac (area) No      Skin No                            Bowel mgmt: Had BM 03/02/13 Bladder mgmt: Voiding on BSC Diabetic mgmt Yes, and is on an insulin pump    Previous Home Environment Living Arrangements: Alone  Lives With: Alone Available Help at Discharge: Family Type of Home: House Home Layout: Two level Home Access: Stairs to enter [employer] of Steps: 4 Bathroom Shower/Tub: Psychologist, counselling;Tub only (tub only on main floor, must go upstairs to shower) Bathroom Toilet: Standard Home Care Services: No Additional Comments: Family [place] in [place]; is there an Inpt Rehab Center near family where pt could go?  Discharge Living Setting Plans for Discharge Living Setting: House;Lives with (comment) (Plans to go to [place] with dtr and son-in-law) Type of Home at Discharge: House Discharge Home Layout: Multi-level Alternate Level Stairs-Number of Steps: 11  (11 steps to finished basement where patient will stay.) Discharge Home Access: Stairs to enter Entrance Stairs-Number of Steps: 5 Does the patient have any problems obtaining your medications?: No  Social/Family/Support Systems Patient Roles: Parent (Has a dtr in [place] and a son in [place].) Contact Information: Alfredo Batty - daughter Anticipated Caregiver: Dtr and son in law Anticipated Caregiver's Contact Information: Judeth Cornfield - dtr [phone] Ability/Limitations of Caregiver: Dtr works.  Dtr will get someone to stay with patient as needed Caregiver Availability: Other (Comment) (Will work on 24 hr supervision) Discharge Plan Discussed with Primary Caregiver: Yes Is Caregiver In Agreement with Plan?: Yes Does Caregiver/Family have Issues with Lodging/Transportation while Pt is in Rehab?: No  Goals/Additional Needs Patient/Family Goal  for Rehab: PT/OT S/Min assist, ST supervision goals Expected length of stay: 3 weeks Cultural Considerations: None Dietary Needs: Carb mod med cal, thin liquids Equipment Needs: TBD Pt/Family Agrees to Admission and willing to participate: Yes Program Orientation Provided & Reviewed with Pt/Caregiver Including Roles  & Responsibilities: Yes   Decrease burden of Care through IP rehab admission: N/A  Possible need for SNF placement upon discharge: Not planned   Patient Condition: This patient's medical and functional status has changed since the consult dated: 02/27/13 in which the Rehabilitation Physician determined and documented that the patient's condition is appropriate for intensive rehabilitative care in an inpatient rehabilitation facility. See "History of Present Illness" (above) for medical update. Functional changes are: Stand pivot transfers total assist +1 50% using the stedy. Patient's medical and functional status update has been discussed with the Rehabilitation physician and patient remains appropriate for inpatient rehabilitation.  Will admit to inpatient rehab today.  Preadmission Screen Completed By:  Trish Mage, 03/03/2013 2:11 PM ______________________________________________________________________   Discussed status with Dr. Riley Kill on 03/03/13 at 1419 and received telephone approval for admission today.  Admission Coordinator:  Trish Mage, time1419/Date12/03/14

## 2013-03-03 NOTE — Progress Notes (Signed)
Stroke Team Progress Note  HISTORY  Patricia Black is a 62 y.o. female with a history of diabetes and migraines who presented with left-sided weakness that she noted on awakening on 02/24/2013. She stated that she was normal when she went to bed the night before but when she awoke at 5 AM she was dizzy, and was dropping things from her left hand. She went out to her car, but fell on her way and was unable to get back up. When her daughter had not heard from her by mid morning she called the [facility] Department who responded and found her next to her car and called EMS. NIHSS: 7. tPA was not given due to outside of window .   SUBJECTIVE Impact study completed. Device removal 03/01/2013.   TEE 12/2 with PFO, lower extremities dopplers + DVT left peroneal vein last evening. Heparin started last night.  Patient feeling stronger. For rehab today.    OBJECTIVE Most recent Vital Signs: Filed Vitals:   03/02/13 1823 03/02/13 2132 03/03/13 0114 03/03/13 0515  BP: 146/40 121/69 164/50 98/50  Pulse: 73 74 85 60  Temp: 98 F (36.7 C) 97.8 F (36.6 C) 98.2 F (36.8 C) 97.8 F (36.6 C)  TempSrc: Oral Oral Oral Oral  Resp: 16 18 18 18   Height:      Weight:      SpO2: 94% 98% 99% 97%   CBG (last 3)   Recent Labs  03/02/13 1604 03/02/13 2101 03/03/13 0623  GLUCAP 239* 253* 282*    IV Fluid Intake:   . sodium chloride 500 mL (03/02/13 1342)  . heparin 1,150 Units/hr (03/03/13 1610)    MEDICATIONS  . aspirin  300 mg Rectal Daily   Or  . aspirin  325 mg Oral Daily  . atorvastatin  80 mg Oral q1800  .  HYDROmorphone (DILAUDID) injection  1 mg Intravenous Once  . insulin aspart  0-5 Units Subcutaneous QHS  . insulin aspart  0-9 Units Subcutaneous TID WC  . lidocaine  15 mL Mouth/Throat Once  . midazolam  4 mg Intravenous Once  . pramipexole  0.125 mg Oral BID   PRN:  acetaminophen, acetaminophen, lidocaine  Diet:  Carb Control  Activity: Ambulated DVT Prophylaxis: Heparin  Sq  CLINICALLY SIGNIFICANT STUDIES Basic Metabolic Panel:   Recent Labs Lab 02/24/13 1429 02/24/13 1513 02/25/13 0803  NA 140 141 137  K 3.7 3.4* 3.7  CL 106 107 102  CO2 21  --  20  GLUCOSE 131* 134* 247*  BUN 19 19 17   CREATININE 0.80 1.00 0.86  CALCIUM 9.3  --  8.4   Liver Function Tests:   Recent Labs Lab 02/24/13 1429 02/25/13 0803  AST 18 21  ALT 15 13  ALKPHOS 139* 120*  BILITOT 0.5 0.5  PROT 7.1 6.1  ALBUMIN 3.5 3.0*   CBC:   Recent Labs Lab 02/24/13 1429  02/25/13 0803 03/03/13 0503  WBC 12.6*  --  8.8 7.5  NEUTROABS 10.2*  --  6.3  --   HGB 13.5  < > 11.4* 11.5*  HCT 39.2  < > 33.5* 34.6*  MCV 85.2  --  85.0 86.3  PLT 241  --  205 196  < > = values in this interval not displayed. Coagulation:   Recent Labs Lab 02/24/13 1429 03/03/13 0503  LABPROT 13.3 13.7  INR 1.03 1.07   Cardiac Enzymes:   Recent Labs Lab 02/24/13 1429  CKTOTAL 248*  TROPONINI <0.30   Urinalysis:  Recent Labs Lab 02/24/13 2307  COLORURINE YELLOW  LABSPEC 1.022  PHURINE 5.5  GLUCOSEU NEGATIVE  HGBUR NEGATIVE  BILIRUBINUR NEGATIVE  KETONESUR 40*  PROTEINUR NEGATIVE  UROBILINOGEN 0.2  NITRITE NEGATIVE  LEUKOCYTESUR NEGATIVE   Lipid Panel    Component Value Date/Time   CHOL 118 02/25/2013 0536   TRIG 108 02/25/2013 0536   HDL 35* 02/25/2013 0536   CHOLHDL 3.4 02/25/2013 0536   VLDL 22 02/25/2013 0536   LDLCALC 61 02/25/2013 0536   HgbA1C  Lab Results  Component Value Date   HGBA1C 7.2* 02/25/2013    Urine Drug Screen:     Component Value Date/Time   LABOPIA POSITIVE* 02/24/2013 2307   COCAINSCRNUR NONE DETECTED 02/24/2013 2307   LABBENZ POSITIVE* 02/24/2013 2307   AMPHETMU NONE DETECTED 02/24/2013 2307   THCU NONE DETECTED 02/24/2013 2307   LABBARB NONE DETECTED 02/24/2013 2307    Alcohol Level:   Recent Labs Lab 02/24/13 1429  ETH <11    Dg Chest 2 View 02/24/2013    No acute findings.     Ct Head Wo Contrast 02/24/2013     Dense right middle cerebral artery as described.  Area of vague decreased attenuation in the right basal ganglia laterally suspicious for acute ischemia.   02/28/2013 Evolving right MCA infarcts, grossly stable since  diffusion-weighted imaging on 02/26/2013. There is petechial  hemorrhage in the right basal ganglia, but no mass effect.  2. No new intracranial abnormality identified.  3. Metallic lead in place for right sphenopalatine ganglion  stimulation, tracking from the hard palate to the right  pterygopalatine fossa.  4. Otherwise stable face CT.   Ct Maxillofacial Wo Cm 02/25/2013    1. The preprocedure and detail in of anatomy for identification of the sphenopalatine ganglion. 2. Hyperdense right MCA with hypo attenuation in the right lentiform nucleus and insular cortex compatible with a right MCA territory infarct.     MRI Head 02/26/2013 1. MRI discontinued prior to completion due to patient condition.  2. Acute infarcts in the right basal ganglia and posterior right MCA territory. Associated edema without mass effect. No definite associated hemorrhage.  MRA Head 02/26/2013 Right MCA occluded just beyond its origin. Minimal if any reconstituted right MCA flow. Dominant appearing distal right vertebral artery. No normal distal left vertebral artery identified.  2-D Echo - ejection fraction 65-70%. No cardiac source of emboli was identified.  TEE normal LV function; mild atherosclerosis descending aorta; positive saline microcavitation study c/w PFO.  Doppler Carotid Dopplers completed.  Preliminary report: There is 1-39% ICA stenosis. Right vertebral artery flow is antegrade. Left vertebral artery flow not insonated   EKG: sinus rhythm, normal axis, normal R wave progression, T wave flattening in aVL and V3, no obvious ischemia change.   Therapy Recommendations - CIR   Physical Exam:   Filed Vitals:   03/02/13 1823 03/02/13 2132 03/03/13 0114 03/03/13 0515  BP:  146/40 121/69 164/50 98/50  Pulse: 73 74 85 60  Temp: 98 F (36.7 C) 97.8 F (36.6 C) 98.2 F (36.8 C) 97.8 F (36.6 C)  TempSrc: Oral Oral Oral Oral  Resp: 16 18 18 18   Height:      Weight:      SpO2: 94% 98% 99% 97%    General: Not in acute distress HEENT: PERRL, EOMI, no scleral icterus, No JVD or bruit Cardiac: S1/S2, RRR, No murmurs, gallops or rubs Pulm: Good air movement bilaterally. Clear to auscultation bilaterally. No rales, wheezing, rhonchi or  rubs. Abd: Soft, nondistended, nontender, no rebound pain, no organomegaly, BS present Ext: No edema. 2+DP/PT pulse bilaterally Musculoskeletal: No joint deformities, erythema, or stiffness, ROM full Skin: No rashes.  Psych: Patient is not psychotic, no suicidal or hemocidal ideation.  Mental Status: Patient is awake, alert, oriented to person, place, month, year, and situation. Immediate and remote memory are intact. Patient is able to give a clear and coherent history. No signs of aphasia or neglect.   Cranial Nerves: II: Visual Fields are full. Pupils are equal, round, and reactive to light.  Discs are difficult to visualize. III,IV, VI: EOMI without ptosis or diploplia.  partial left hemianopsia V: Facial sensation is symmetric to temperature VII: Facial movement is decreased on the left with dysarthria. VIII: hearing is intact to voice X: Uvula elevates symmetrically XI: Shoulder shrug is symmetric. XII: tongue is midline without atrophy or fasciculations.   Motor: Tone is normal. Bulk is normal. 5/5 strength was present in right arm and leg. She has 1/5 weakness of the left arm and 4/5 of left leg with mild drift Sensory: Sensation is symmetric to pin in the arms and legs. Deep Tendon Reflexes: 2+ and symmetric in the biceps and patellae.   Cerebellar: FNF and HKS are intact on right, difficult to commensurate with weakness on left Gait: Unable to test due to weakness NIHSS 5. ( 1 for facial droop and 3 for LUE  weakness and 1 for LLE drift) ASSESSMENT Ms. Patricia Black is a 62 y.o. female presenting with left side weakness. tPA was not given due to outside of window. MRI revealed acute infarcts in the right basal ganglia and posterior right MCA territory. 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 Not on anticoagulation prior to admission. Now on aspirin 325 mg orally every day plus HEPARIN (due to left DVT) for secondary stroke prevention. Patient with resultant left side weakness. Work up underway.    Diabetes Mellitus, HGB A1C  7.2, goal < 6.5  Hyperlipidemia, LDL: 61, at goal < 70 in diabetics, on lipitor statin.   Patient was out of the window for acute intervention but did meet criteria for IMPACT STUDY.  Obstructive sleep apnea: on bipap at home.  Shoulder pain, xray negative for fracture.  Deep vein thrombosis, left mid segment peroneal vein.  Patent foramen ovale  Hospital day # 7  Weakness of the left arm and leg  Is stable from yesterday.  TREATMENT/PLAN  Change to xarelto 15mg  twice a day for 21 days, then 20mg  daily thereafter for secondary stroke prevention.  IMPACT-24 completed on 02/28/2013. Device for sphenopalatine ganglion stimulation removed 03/01/2013  TEE completed. +PFO. The left lower extremity is positive for deep vein thrombosis involving the mid segment of the peroneal vein. Will discontinue aspirin daily. Now start xarelto 15mg  twice a day for 21 days, then xarelto 20mg  once daily thereafter. The heparin is now discontinued.  CIR today.  Patient should follow up in office in 2 months. Research will call for follow up appointment.  Dr. Pearlean Brownie discussed plan of care with patient and family (daughter). I have discussed xarelto plan with rehab admitting team.  Gwendolyn Lima. Manson Passey, [facility], MBA, MHA [facility] Pager: [phone] 03/03/2013 8:43 AM  I have personally obtained a history, examined the patient, evaluated  imaging results, and formulated the assessment and plan of care. I agree with the above. Delia Heady, MD

## 2013-03-03 NOTE — H&P (View-Only) (Signed)
Physical Medicine and Rehabilitation Admission H&P    Chief Complaint  Patient presents with  . Facial droop and left sided weakness.    HPI: Patricia Black is a 62 y.o. right handed female history of migraine headaches as well as diabetes mellitus (insulin pump) who was admitted 02/24/2013 with left-sided weakness,dizziness as well as fall on her back porch-- found by [facility] when family was unable to get in touch with her.   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 MRI /MRA brain showed acute infarcts in the right basal ganglia and posterior right MCA territory with associated edema and occluded R-MCA beyond origin.  Echocardiogram with ejection fraction 70% grade 2 diastolic dysfunction. [name]. Neurology recommended ASA for embolic infarct due to occlusion of R-MCA artery and TEE ordered for workup. TEE revealed PFO and BLE dopplers were positive for L-mid peroneal vein DVT. She was started on xarelto for treatment of DVT. Patient with resultant left hemiparesis, left facial weakness as well as LLE instability with difficulty with mobility. Therapy team recommended CIR and patient admitted today.     Review of Systems  HENT: Negative for hearing loss.   Eyes: Negative for blurred vision and double vision.  Respiratory: Negative for cough and shortness of breath.   Cardiovascular: Negative for chest pain and palpitations.  Gastrointestinal: Negative for nausea, vomiting, abdominal pain and constipation.       Sore mouth and throat  Genitourinary: Negative for dysuria, urgency and frequency.  Musculoskeletal: Negative for myalgias.  Neurological: Positive for focal weakness. Negative for dizziness, tingling and headaches.   Past Medical History  Diagnosis Date  . Diabetes mellitus without complication   . Migraine   . Sleep apnea   . Mitral valve prolapse    Past Surgical History  Procedure Laterality Date   . Abdominal hysterectomy    . Cholecystectomy    . Tee without cardioversion N/A 03/02/2013    Procedure: TRANSESOPHAGEAL ECHOCARDIOGRAM (TEE);  Surgeon: Lewayne Bunting, MD;  Location: [facility] ENDOSCOPY;  Service: Cardiovascular;  Laterality: N/A;   Family History  Problem Relation Age of Onset  . Diabetes Mother   . Cancer Mother     uterine  . Emphysema Father     Social History: Divorced. 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 She reports that she has never smoked. She has never used smokeless tobacco. She reports that she does drinks alcohol--wine or mixed drink once a month. She does not use illicit drugs.   Allergies  Allergen Reactions  . Penicillins Other (See Comments)    Unknown reaction   Medications Prior to Admission  Medication Sig Dispense Refill  . atorvastatin (LIPITOR) 80 MG tablet Take 80 mg by mouth daily at 6 PM.      . fluticasone (FLONASE) 50 MCG/ACT nasal spray Place 1 spray into both nostrils daily as needed for allergies or rhinitis.      . Fluticasone-Salmeterol (ADVAIR) 100-50 MCG/DOSE AEPB Inhale 1 puff into the lungs 2 (two) times daily.      . hydrochlorothiazide (HYDRODIURIL) 25 MG tablet Take 25 mg by mouth daily.      . insulin lispro (HUMALOG) 100 UNIT/ML injection Inject into the skin 3 (three) times daily before meals. Sliding scale      . propranolol ER (INDERAL LA) 160 MG SR capsule Take 160 mg by mouth daily.      . Topiramate ER (  TROKENDI XR) 100 MG CP24 Take 100 mg by mouth daily.      . Topiramate ER (TROKENDI XR) 50 MG CP24 Take 50 mg by mouth daily.        Home: Home Living Family/patient expects to be discharged to:: Inpatient rehab Living Arrangements: Alone Available Help at Discharge: Family Type of Home: House Home Access: Stairs to enter Secretary/administrator of Steps: 4 Home Layout: Two level Home Equipment: None Additional Comments: [place]; is there an Inpt Rehab Center near family where  pt could go?  Lives With: Alone   Functional History:    Functional Status:  Mobility: Bed Mobility Bed Mobility: Rolling Left;Left Sidelying to Sit;Supine to Sit;Sitting - Scoot to [employer] of Bed;Sit to Supine Rolling Right: 4: Min assist;With rail Rolling Left: 4: Min assist;With rail Right Sidelying to Sit: 4: Min assist;With rails Left Sidelying to Sit: 4: Min assist;With rails;HOB elevated Supine to Sit: 3: Mod assist;With rails Sitting - Scoot to Edge of Bed: 3: Mod assist Sit to Supine: 4: Min guard;HOB flat Transfers Transfers: Sit to Stand;Stand to [employer] Transfers Sit to Stand: 4: Min assist;With upper extremity assist;From chair/3-in-1 Stand to Sit: 4: Min assist;With upper extremity assist;To bed Stand Pivot Transfers: 1: +1 Total assist Stand Pivot Transfers: Patient Percentage: 50% Transfer via Lift Equipment: Stedy Ambulation/Gait Ambulation/Gait Assistance: Not tested (comment)    ADL: ADL Eating/Feeding: Set up Where Assessed - Eating/Feeding: Chair (using Rt UE) Grooming: Wash/dry hands;Wash/dry face;Teeth care;Min guard Where Assessed - Grooming: Supported sitting (sitting on stedy) Upper Body Dressing: Moderate assistance Where Assessed - Upper Body Dressing: Unsupported sitting Toilet Transfer: Minimal assistance Toilet Transfer Method: Sit to stand Toilet Transfer Equipment: Raised toilet seat with arms (or 3-in-1 over toilet);Other (comment) (using stedy) Equipment Used: Other (comment) (stedy) Transfers/Ambulation Related to ADLs: Pt completed sit<>stand in stedy x4 during session ADL Comments: Pt on 3n1 at bedside with 3n1 on arrival. Pt provided stedy to allow static standing for peri care. pt placing LT UE on stedy handle with RT ue without cues. Pt min (A) to complete static standing. Pt was able to perform hip flexion using stedy bar to help with peri care. Pt transfered to sink level. Pt using RT UE to place tooth paste in Lt hand . Pt with  no lt hand grasp but was able to use bar and position of hand to maintain paste in lt hand. Pt opening tooth paste with Rt UE. Pt removing paste with Rt ue and applying it to tooth brush on counter surface. Pt demonstrates good problem solving without cues this session from therapist. Pt performs oral care using a cup. Pt with LT UE on bar of stedy throughout session for weight bearing Pt demonstrates self awareness by using RT UE to maintain lt ue in position when leaning forward x2 to spit into sink . Pt using RT UE to ensure LT UE stayed weight bearing was great problem solving. Pt static standing in stedy to locate cell phone in room. pt required x4 attempts to unlock cell phone. Pt applying all the correct numbers but in the wrong sequence. Pt required incr time to recall sequence. Pt returned to supine. Pt demonstrated ability to lift bil LE into the bed this session. Pt self reports seeing digits move this AM. Pt was unable to return demo at this time. Pt did demonstrates ability to move elbow into flexion with gravity eliminated using some accessory muscle movements. Pt demonstrates tone with elbow  extension.  Cognition: Cognition Overall Cognitive Status: Within Functional Limits for tasks assessed Arousal/Alertness: Awake/alert Orientation Level: Oriented X4 Attention: Selective;Alternating Selective Attention: Appears intact Alternating Attention: Appears intact Memory: Appears intact Awareness: Appears intact Problem Solving: Appears intact Executive Function: Sequencing;Reasoning;Organizing Reasoning: Appears intact Sequencing: Appears intact Organizing: Appears intact Safety/Judgment: Appears intact Cognition Arousal/Alertness: Awake/alert Behavior During Therapy: WFL for tasks assessed/performed Overall Cognitive Status: Within Functional Limits for tasks assessed Memory: Decreased short-term memory  Physical Exam: Blood pressure 129/72, pulse 72, temperature 98 F (36.7 C),  temperature source Oral, resp. rate 18, height 5\' 7"  (1.702 m), weight 121.11 kg (267 lb), SpO2 94.00%. Physical Exam  Nursing note and vitals reviewed. Constitutional: She is oriented to person, place, and time. She appears well-developed and well-nourished.  HENT:  Head: Normocephalic and atraumatic.  Edentulous  Eyes: Conjunctivae are normal. Pupils are equal, round, and reactive to light.  Neck: Normal range of motion. Neck supple. No tracheal deviation present. No thyromegaly present.  Cardiovascular: Normal rate, regular rhythm and normal heart sounds.   No murmur heard. Respiratory: Effort normal and breath sounds normal. No respiratory distress. She has no wheezes.  GI: Soft. Bowel sounds are normal. She exhibits no distension. There is no tenderness. There is no rebound.  Musculoskeletal: She exhibits no edema and no tenderness.  Neurological: She is alert and oriented to person, place, and time.  Speech clear. Left central 7 and tongue deviation. Speech slightly dysarthric. Follows basic commands without difficulty. Has reasonable insight and awareness of deficits.  Motor strength is 5/5 in the right deltoid, bicep, tricep, grip 5/5 in the right hip flexor knee extensor ankle dorsiflexor and plantar flexion Trace/ 5 left deltoid, bicep, tricep, 0 left finger flexors 2 minus left hip flexor,  2+ to 3 minus left knee extensor, 1/5 in the left ankle dorsiflexor and plantar flexor No clonus left ankle Sensory exam nearly normal  to light touch in the left upper extremity and left lower ext. normal on the right side.     Psychiatric: She has a normal mood and affect. Her behavior is normal. Judgment and thought content normal.    Results for orders placed during the hospital encounter of 02/24/13 (from the past 48 hour(s))  GLUCOSE, CAPILLARY     Status: Abnormal   Collection Time    03/01/13  4:25 PM      Result Value Range   Glucose-Capillary 252 (*) 70 - 99 mg/dL  GLUCOSE,  CAPILLARY     Status: Abnormal   Collection Time    03/01/13 10:09 PM      Result Value Range   Glucose-Capillary 261 (*) 70 - 99 mg/dL  GLUCOSE, CAPILLARY     Status: Abnormal   Collection Time    03/02/13  4:53 AM      Result Value Range   Glucose-Capillary 295 (*) 70 - 99 mg/dL  GLUCOSE, CAPILLARY     Status: Abnormal   Collection Time    03/02/13 11:29 AM      Result Value Range   Glucose-Capillary 286 (*) 70 - 99 mg/dL   Comment 1 Notify RN    GLUCOSE, CAPILLARY     Status: Abnormal   Collection Time    03/02/13  4:04 PM      Result Value Range   Glucose-Capillary 239 (*) 70 - 99 mg/dL  GLUCOSE, CAPILLARY     Status: Abnormal   Collection Time    03/02/13  9:01 PM  Result Value Range   Glucose-Capillary 253 (*) 70 - 99 mg/dL   Comment 1 Documented in Chart     Comment 2 Notify RN    HEPARIN LEVEL (UNFRACTIONATED)     Status: Abnormal   Collection Time    03/03/13  5:03 AM      Result Value Range   Heparin Unfractionated 0.15 (*) 0.30 - 0.70 IU/mL   Comment:            IF HEPARIN RESULTS ARE BELOW     EXPECTED VALUES, AND PATIENT     DOSAGE HAS BEEN CONFIRMED,     SUGGEST FOLLOW UP TESTING     OF ANTITHROMBIN III LEVELS.  CBC     Status: Abnormal   Collection Time    03/03/13  5:03 AM      Result Value Range   WBC 7.5  4.0 - 10.5 K/uL   RBC 4.01  3.87 - 5.11 MIL/uL   Hemoglobin 11.5 (*) 12.0 - 15.0 g/dL   HCT 96.0 (*) 45.4 - 09.8 %   MCV 86.3  78.0 - 100.0 fL   MCH 28.7  26.0 - 34.0 pg   MCHC 33.2  30.0 - 36.0 g/dL   RDW 11.9  14.7 - 82.9 %   Platelets 196  150 - 400 K/uL  PROTIME-INR     Status: None   Collection Time    03/03/13  5:03 AM      Result Value Range   Prothrombin Time 13.7  11.6 - 15.2 seconds   INR 1.07  0.00 - 1.49  GLUCOSE, CAPILLARY     Status: Abnormal   Collection Time    03/03/13  6:23 AM      Result Value Range   Glucose-Capillary 282 (*) 70 - 99 mg/dL   Comment 1 Documented in Chart     Comment 2 Notify RN    GLUCOSE,  CAPILLARY     Status: Abnormal   Collection Time    03/03/13 11:50 AM      Result Value Range   Glucose-Capillary 299 (*) 70 - 99 mg/dL   Comment 1 Documented in Chart     Dg Shoulder Right  03/01/2013   CLINICAL DATA:  Larey Seat, posterior right shoulder pain  EXAM: RIGHT SHOULDER - 2+ VIEW  COMPARISON:  None.  FINDINGS: There is no evidence of fracture or dislocation. There is no evidence of arthropathy or other focal bone abnormality. Soft tissues are unremarkable.  IMPRESSION: Negative.   Electronically Signed   By: Esperanza Heir M.D.   On: 03/01/2013 15:26    Post Admission Physician Evaluation: 1. Functional deficits secondary  to embolic right MCA infarct. 2. Patient is admitted to receive collaborative, interdisciplinary care between the physiatrist, rehab nursing staff, and therapy team. 3. Patient's level of medical complexity and substantial therapy needs in context of that medical necessity cannot be provided at a lesser intensity of care such as a SNF. 4. Patient has experienced substantial functional loss from his/her baseline which was documented above under the "Functional History" and "Functional Status" headings.  Judging by the patient's diagnosis, physical exam, and functional history, the patient has potential for functional progress which will result in measurable gains while on inpatient rehab.  These gains will be of substantial and practical use upon discharge  in facilitating mobility and self-care at the household level. 5. Physiatrist will provide 24 hour management of medical needs as well as oversight of the therapy plan/treatment and provide  guidance as appropriate regarding the interaction of the two. 6. 24 hour rehab nursing will assist with bladder management, bowel management, safety, skin/wound care, disease management, medication administration, pain management and patient education  and help integrate therapy concepts, techniques,education, etc. 7. PT will assess  and treat for/with: Lower extremity strength, range of motion, stamina, balance, functional mobility, safety, adaptive techniques and equipment, NMR, education, joint mobilization and stability.   Goals are: supervision to minimal assist. 8. OT will assess and treat for/with: ADL's, functional mobility, safety, upper extremity strength, adaptive techniques and equipment, NMR, education, shoulder mobilization and stability.   Goals are: supervision to min assist. 9. SLP will assess and treat for/with: speech intelligibility.  Goals are: mod I. 10. Case Management and Social Worker will assess and treat for psychological issues and discharge planning. 11. Team conference will be held weekly to assess progress toward goals and to determine barriers to discharge. 12. Patient will receive at least 3 hours of therapy per day at least 5 days per week. 13. ELOS: 18-21 days       14. Prognosis:  excellent   Medical Problem List and Plan: 1. DVT left mid peroneal vein/Anticoagulation: Pharmaceutical: Xarelto 2. Pain Management: prn tylenol effective for pain.  3. Mood: Has good outlook and aware of time needed for recovery. Will have LCSW follow for evaluation. 4. Neuropsych: This patient is capable of making decisions on her own behalf. 5. DM type 2--insulin dependent: On insulin pump at home--add levemir for basal insulin and use SSI as needed. Po intake is good.  6. Migraines: will resume Topamax and inderal at lower dose to avoid recurrence. Titrate as needed.  7. Oral pain: due to impact trial probe as well as sore throat from TEE. Continue lidocaine mouth washt--swish and spit. Pain is beginning to improve 8. Hyperactive airway: resume Advair  Ranelle Oyster, MD, [facility] Physical Medicine & Rehabilitation   03/03/2013

## 2013-03-03 NOTE — Progress Notes (Signed)
Pt on home CPAP unit.

## 2013-03-03 NOTE — Progress Notes (Signed)
Rehab admissions - I have authorization from [employer] for acute inpatient rehab admission for today.  Patient and daughter are pleased.  Bed available and can admit to acute inpatient rehab today if okay with attending MD.  Call me for questions.  #[phone]

## 2013-03-03 NOTE — Progress Notes (Signed)
PT Cancellation Note  Patient Details Name: Patricia Black MRN: [id number] DOB: Aug 18, 1950   Cancelled Treatment:    Reason Eval/Treat Not Completed: Medical issues which prohibited therapy. Patient positive for DVT with anticoagulants started at 20:00 last night. Per department protocol patient must be on anticoagulants x24 hours prior to mobilizations. Will follow up in AM.    Korie Brabson, Adline Potter 03/03/2013, 1:44 PM

## 2013-03-03 NOTE — Progress Notes (Signed)
RT Note:  Patient has home CPAP machine.  RT removed machine from case and set up for patient.  No frayed wires.  Machine is plugged in, mask at bedside for patient. RN aware.

## 2013-03-03 NOTE — Interval H&P Note (Signed)
Patricia Black was admitted today to Inpatient Rehabilitation with the diagnosis of right CVA.  The patient's history has been reviewed, patient examined, and there is no change in status.  Patient continues to be appropriate for intensive inpatient rehabilitation.  I have reviewed the patient's chart and labs.  Questions were answered to the patient's satisfaction.  [name] T 03/03/2013, 10:20 PM

## 2013-03-03 NOTE — Progress Notes (Signed)
UR complete.  Fey Coghill RN, MSN 

## 2013-03-03 NOTE — Discharge Summary (Signed)
Stroke Discharge Summary  Patient ID: Patricia Black   MRN: [id number]      DOB: 07-05-1950  Date of Admission: 02/24/2013 Date of Discharge: 03/03/2013  Attending Physician:  Darcella Cheshire, MD, Stroke MD     Consulting Physician(s):  Treatment Team:  Md Stroke, MD rehabilitation medicine , cardiology (TEE)  Patient's PCP:  No primary provider on file.  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  Past Medical History  Diagnosis Date  . Diabetes mellitus without complication   . Migraine   . Sleep apnea   . Mitral valve prolapse    Past Surgical History  Procedure Laterality Date  . Abdominal hysterectomy    . Cholecystectomy    . Tee without cardioversion N/A 03/02/2013    Procedure: TRANSESOPHAGEAL ECHOCARDIOGRAM (TEE);  Surgeon: Lewayne Bunting, MD;  Location: [facility] END[facility];  Service: Cardiovascular;  Laterality: N/A;    Medications to be continued on Rehab . atorvastatin  80 mg Oral q1800  .  HYDROmorphone (DILAUDID) injection  1 mg Intravenous Once  . insulin aspart  0-5 Units Subcutaneous QHS  . insulin aspart  0-9 Units Subcutaneous TID WC  . lidocaine  15 mL Mouth/Throat Once  . midazolam  4 mg Intravenous Once  . pramipexole  0.125 mg Oral BID  . Rivaroxaban  15 mg Oral BID WC    LABORATORY STUDIES CBC    Component Value Date/Time   WBC 7.5 03/03/2013 0503   RBC 4.01 03/03/2013 0503   HGB 11.5* 03/03/2013 0503   HCT 34.6* 03/03/2013 0503   PLT 196 03/03/2013 0503   MCV  86.3 03/03/2013 0503   MCH 28.7 03/03/2013 0503   MCHC 33.2 03/03/2013 0503   RDW 14.5 03/03/2013 0503   LYMPHSABS 1.9 02/25/2013 0803   MONOABS 0.6 02/25/2013 0803   EOSABS 0.1 02/25/2013 0803   BASOSABS 0.0 02/25/2013 0803   CMP    Component Value Date/Time   NA 137 02/25/2013 0803   K 3.7 02/25/2013 0803   CL 102 02/25/2013 0803   CO2 20 02/25/2013 0803   GLUCOSE 247* 02/25/2013 0803   BUN 17 02/25/2013 0803   CREATININE 0.86 02/25/2013 0803   CALCIUM 8.4 02/25/2013 0803   PROT 6.1 02/25/2013 0803   ALBUMIN 3.0* 02/25/2013 0803   AST 21 02/25/2013 0803   ALT 13 02/25/2013 0803   ALKPHOS 120* 02/25/2013 0803   BILITOT 0.5 02/25/2013 0803   GFRNONAA 71* 02/25/2013 0803   GFRAA 82* 02/25/2013 0803   COAGS Lab Results  Component Value Date   INR 1.07 03/03/2013   INR 1.03 02/24/2013   Lipid Panel    Component Value Date/Time   CHOL 118 02/25/2013 0536   TRIG 108 02/25/2013 0536   HDL 35* 02/25/2013 0536   CHOLHDL 3.4 02/25/2013 0536   VLDL 22 02/25/2013 0536   LDLCALC 61 02/25/2013 0536   HgbA1C  Lab Results  Component Value Date   HGBA1C 7.2* 02/25/2013   Cardiac Panel (last 3 results) No results  found for this basename: CKTOTAL, CKMB, TROPONINI, RELINDX,  in the last 72 hours Urinalysis    Component Value Date/Time   COLORURINE YELLOW 02/24/2013 2307   APPEARANCEUR CLEAR 02/24/2013 2307   LABSPEC 1.022 02/24/2013 2307   PHURINE 5.5 02/24/2013 2307   GLUCOSEU NEGATIVE 02/24/2013 2307   HGBUR NEGATIVE 02/24/2013 2307   BILIRUBINUR NEGATIVE 02/24/2013 2307   KETONESUR 40* 02/24/2013 2307   PROTEINUR NEGATIVE 02/24/2013 2307   UROBILINOGEN 0.2 02/24/2013 2307   NITRITE NEGATIVE 02/24/2013 2307   LEUKOCYTESUR NEGATIVE 02/24/2013 2307   Urine Drug Screen     Component Value Date/Time   LABOPIA POSITIVE* 02/24/2013 2307   COCAINSCRNUR NONE DETECTED 02/24/2013 2307   LABBENZ POSITIVE* 02/24/2013 2307   AMPHETMU NONE DETECTED 02/24/2013 2307   THCU NONE  DETECTED 02/24/2013 2307   LABBARB NONE DETECTED 02/24/2013 2307    Alcohol Level    Component Value Date/Time   [employer] <11 02/24/2013 1429     SIGNIFICANT DIAGNOSTIC STUDIES Dg Chest 2 View  02/24/2013  No acute findings.   Ct Head Wo Contrast  02/24/2013  Dense right middle cerebral artery as described. Area of vague decreased attenuation in the right basal ganglia laterally suspicious for acute ischemia.  02/28/2013  Evolving right MCA infarcts, grossly stable since  diffusion-weighted imaging on 02/26/2013. There is petechial  hemorrhage in the right basal ganglia, but no mass effect.  2. No new intracranial abnormality identified.  3. Metallic lead in place for right sphenopalatine ganglion  stimulation, tracking from the hard palate to the right  pterygopalatine fossa.  4. Otherwise stable face CT.   Ct Maxillofacial Wo Cm  02/25/2013  1. The preprocedure and detail in of anatomy for identification of the sphenopalatine ganglion. 2. Hyperdense right MCA with hypo attenuation in the right lentiform nucleus and insular cortex compatible with a right MCA territory infarct.   MRI Head  02/26/2013  1. MRI discontinued prior to completion due to patient condition.  2. Acute infarcts in the right basal ganglia and posterior right MCA territory. Associated edema without mass effect. No definite associated hemorrhage.   MRA Head  02/26/2013  Right MCA occluded just beyond its origin. Minimal if any reconstituted right MCA flow. Dominant appearing distal right vertebral artery. No normal distal left vertebral artery identified.   2-D Echo - ejection fraction 65-70%. No cardiac source of emboli was identified.   TEE normal LV function; mild atherosclerosis descending aorta; positive saline microcavitation study c/w PFO.   Doppler  Carotid Dopplers completed.  Preliminary report: There is 1-39% ICA stenosis. Right vertebral artery flow is antegrade. Left vertebral artery flow  not insonated   LE dopplers: left lower extremity is positive for deep vein thrombosis involving the mid segment of the peroneal vein.      History of Present Illness     Patricia Black is a 62 y.o. female with a history of diabetes and migraines who presented with left-sided weakness that she noted on awakening on 02/24/2013. She stated that she was normal when she went to bed the night before but when she awoke at 5 AM she was dizzy, and was dropping things from her left hand. She went out to her car, but fell on her way and was unable to get back up. When her daughter had not heard from her by mid morning she called the [facility] Department who responded and found her next to her car and called EMS. NIHSS: 7. tPA was not given due to  outside of window    Hospital Course   Patient was not tPA candidate due unknown time of onset. However, the patient was enrolled in the the IMPACT study. The study is looking at improving collateralization through the use of sphenopalatine ganglion stimulation. Care would proceed as is typically be done other than the study device and periodic stimulation of that device one hour each day over 5 total days.   MRI revealed acute infarcts in the right basal ganglia and posterior right MCA territory. Infarcts felt to be secondary to cardio-embolic source. Not on anticoagulation prior to admission. Now on xarelto 15mg  twice a day for 21 days, then 20mg  once a day thereafter. Patient with resultant mild left side weakness. Diabetes Mellitus, HGB A1C 7.2, goal < 6.5  Hyperlipidemia, LDL: 61, at goal < 70 in diabetics, on lipitor statin.  Patient was out of the window for acute intervention but did meet criteria for IMPACT STUDY.  Obstructive sleep apnea: on bipap at home.  Shoulder pain, xray negative for fracture.  Deep vein thrombosis, left mid segment peroneal vein: treat with xarelto  Patent foramen ovale Hospital day # 7   TREATMENT/PLAN  Change to xarelto  15mg  twice a day for 21 days, then 20mg  daily thereafter for secondary stroke prevention.  IMPACT-24 completed on 02/28/2013. Device for sphenopalatine ganglion stimulation removed 03/01/2013  TEE completed. +PFO. The left lower extremity is positive for deep vein thrombosis involving the mid segment of the peroneal vein. Will discontinue aspirin daily. Now start xarelto 15mg  twice a day for 21 days, then xarelto 20mg  once daily thereafter. CIR today. Patient should follow up in office in 2 months. Research will call for follow up appointment.  Dr. Pearlean Brownie discussed plan of care with patient and family (daughter). I have discussed xarelto plan with rehab admitting team  Patient with vascular risk factors of:   Diabetes mellitus  Hyperlipidemia  Patent foramen ovale  Patient has resultant left hemiparesis. Physical therapy, occupational therapy and speech therapy evaluated patient. All agreed inpatient rehab is needed. Patient's family is/are supportive and can provide care at discharge. CIR bed is available today and patient will be transferred there.  Discharge Exam  Blood pressure 104/72, pulse 72, temperature 98.6 F (37 C), temperature source Oral, resp. rate 18, height 5\' 7"  (1.702 m), weight 121.11 kg (267 lb), SpO2 96.00%.   Patient is awake, alert, oriented to person, place, month, year, and situation.  Immediate and remote memory are intact.  Patient is able to give a clear and coherent history.  No signs of aphasia or neglect.  Cranial Nerves:  II: Visual Fields are full. Pupils are equal, round, and reactive to light. Discs are difficult to visualize.  III,IV, VI: EOMI without ptosis or diploplia. partial left hemianopsia  V: Facial sensation is symmetric to temperature  VII: Facial movement is decreased on the left with dysarthria.  VIII: hearing is intact to voice  X: Uvula elevates symmetrically  XI: Shoulder shrug is symmetric.  XII: tongue is midline without atrophy or  fasciculations.  Motor:  Tone is normal. Bulk is normal. 5/5 strength was present in right arm and leg. She has 1/5 weakness of the left arm and 3/5 of left leg  Sensory:  Sensation is symmetric to pin in the arms and legs.  Deep Tendon Reflexes:  2+ and symmetric in the biceps and patellae.  Cerebellar:  FNF and HKS are intact on right, difficult to commensurate with weakness on left  Gait:  Unable to test due to weakness   Discharge Diet  Carb Control medium, thin liquids  Discharge Plan  Disposition:  Transfer to [facility] for ongoing PT, OT and ST  xarelto 15mg  twice a day for 21 days, then 20mg  daily thereafter for secondary stroke prevention.  Ongoing risk factor control by Primary Care Physician. Risk factor recommendations:  Hypertension target range 130-140/70-80 Lipid range - LDL < 100 and checked every 6 months, fasting Diabetes - HgB A1C <7   Follow-up with primary provider within 1 month.  Follow-up with Dr. Delia Heady, Stroke Clinic in 2 months.  Research department to contact the patient for those visits necessary for participation  35 minutes were spent preparing discharge.  Signed  Gwendolyn Lima. Manson Passey, [facility], MBA, MHA [facility] Pager: [phone] 03/03/2013 3:29 PM  I have personally obtained a history, examined the patient, evaluated imaging results, and formulated the assessment and plan of care. I agree with the above.  Delia Heady, MD

## 2013-03-03 NOTE — Progress Notes (Signed)
ANTICOAGULATION CONSULT NOTE  Pharmacy Consult for heparin Indication: DVT with CVA  Allergies  Allergen Reactions  . Penicillins Other (See Comments)    Unknown reaction    Patient Measurements: Height: 5\' 7"  (170.2 cm) Weight: 267 lb (121.11 kg) IBW/kg (Calculated) : 61.6 Heparin Dosing Weight: 90kg  Vital Signs: Temp: 97.8 F (36.6 C) (12/03 0515) Temp src: Oral (12/03 0515) BP: 98/50 mmHg (12/03 0515) Pulse Rate: 60 (12/03 0515)  Labs:  Recent Labs  03/03/13 0503  HGB 11.5*  HCT 34.6*  PLT 196  LABPROT 13.7  INR 1.07  HEPARINUNFRC 0.15*    Estimated Creatinine Clearance: 91.4 ml/min (by C-G formula based on Cr of 0.86).  Assessment: 62 yo female with LLE DVT, recent CVA, for heparin  Goal of Therapy:  Heparin level= 0.3-0.5 Monitor platelets by anticoagulation protocol: Yes   Plan:  Increase Heparin 1150 units/hr Check heparin level in 8 hours.  Geannie Risen, PharmD, BCPS   03/03/2013 6:24 AM

## 2013-03-04 ENCOUNTER — Inpatient Hospital Stay (HOSPITAL_COMMUNITY): Payer: Self-pay | Admitting: Occupational Therapy

## 2013-03-04 ENCOUNTER — Inpatient Hospital Stay (HOSPITAL_COMMUNITY): Payer: Managed Care, Other (non HMO) | Admitting: Speech Pathology

## 2013-03-04 ENCOUNTER — Inpatient Hospital Stay (HOSPITAL_COMMUNITY): Payer: Self-pay | Admitting: Rehabilitation

## 2013-03-04 DIAGNOSIS — I6330 Cerebral infarction due to thrombosis of unspecified cerebral artery: Secondary | ICD-10-CM

## 2013-03-04 DIAGNOSIS — G8110 Spastic hemiplegia affecting unspecified side: Secondary | ICD-10-CM

## 2013-03-04 LAB — COMPREHENSIVE METABOLIC PANEL
ALT: 28 U/L (ref 0–35)
AST: 27 U/L (ref 0–37)
Albumin: 2.8 g/dL — ABNORMAL LOW (ref 3.5–5.2)
Alkaline Phosphatase: 111 U/L (ref 39–117)
BUN: 12 mg/dL (ref 6–23)
Chloride: 100 mEq/L (ref 96–112)
GFR calc non Af Amer: 88 mL/min — ABNORMAL LOW (ref >90)
Potassium: 4.1 mEq/L (ref 3.5–5.1)
Sodium: 136 mEq/L (ref 135–145)
Total Bilirubin: 0.5 mg/dL (ref 0.3–1.2)

## 2013-03-04 LAB — GLUCOSE, CAPILLARY
Glucose-Capillary: 282 mg/dL — ABNORMAL HIGH (ref 70–99)
Glucose-Capillary: 296 mg/dL — ABNORMAL HIGH (ref 70–99)
Glucose-Capillary: 307 mg/dL — ABNORMAL HIGH (ref 70–99)

## 2013-03-04 LAB — CBC WITH DIFFERENTIAL/PLATELET
Basophils Relative: 0 % (ref 0–1)
Eosinophils Relative: 2 % (ref 0–5)
Hemoglobin: 11.4 g/dL — ABNORMAL LOW (ref 12.0–15.0)
Lymphs Abs: 1.5 10*3/uL (ref 0.7–4.0)
MCH: 28.5 pg (ref 26.0–34.0)
MCHC: 33.4 g/dL (ref 30.0–36.0)
Monocytes Relative: 8 % (ref 3–12)
Neutro Abs: 5.1 10*3/uL (ref 1.7–7.7)
Neutrophils Relative %: 69 % (ref 43–77)
Platelets: 206 10*3/uL (ref 150–400)
RBC: 4 MIL/uL (ref 3.87–5.11)
RDW: 14.5 % (ref 11.5–15.5)

## 2013-03-04 MED ORDER — RIVAROXABAN 20 MG PO TABS: 20.0000 mg | ORAL_TABLET | Freq: Every day | ORAL | Status: DC

## 2013-03-04 NOTE — IPOC Note (Signed)
Overall Plan of Care [facility]) Patient Details Name: Patricia Black MRN: [id number] DOB: 1951-01-06  Admitting Diagnosis: RT CVA  Hospital Problems: Active Problems:   CVA (cerebral infarction)     Functional Problem List: Nursing Pain;Medication Management;Safety;Skin Integrity  PT Balance;Edema;Endurance;Motor;Perception;Safety  OT Balance;Cognition;Motor;Perception;Safety;Sensory;Vision  SLP Cognition;Linguistic  TR         Basic ADL's: OT Eating;Grooming;Bathing;Dressing;Toileting     Advanced  ADL's: OT Simple Meal Preparation     Transfers: PT Bed Mobility;Bed to Chair;Car;Furniture  OT Toilet;Tub/Shower     Locomotion: PT Ambulation;Stairs     Additional Impairments: OT Fuctional Use of Upper Extremity  SLP Communication;Social Cognition expression Problem Solving;Awareness;Memory  TR      Anticipated Outcomes Item Anticipated Outcome  Self Feeding modified independent  Swallowing      Basic self-care  supervision  Toileting  supervision   Bathroom Transfers supervision  Bowel/Bladder  Remain continent of bowel and bladder  Transfers  mod I  Locomotion  supervision  Communication  Supervision   Cognition  Supervision  Pain  Pain manage at or below 3  Safety/Judgment  Min assist   Therapy Plan: PT Intensity: Minimum of 1-2 x/day ,45 to 90 minutes PT Frequency: 5 out of 7 days PT Duration Estimated Length of Stay: 12-14 days OT Intensity: Minimum of 1-2 x/day, 45 to 90 minutes OT Duration/Estimated Length of Stay: 14-16 days SLP Intensity: Minumum of 1-2 x/day, 30 to 90 minutes SLP Frequency: 5 out of 7 days SLP Duration/Estimated Length of Stay: 12-14       Team Interventions: Nursing Interventions Patient/Family Education;Medication Management;Skin Care/Wound Management;Pain Management  PT interventions Ambulation/gait training;Balance/vestibular training;Community reintegration;Discharge planning;Disease  management/prevention;DME/adaptive equipment instruction;Functional mobility training;Neuromuscular re-education;Patient/family education;Pain management;Psychosocial support;Splinting/orthotics;Stair training;Therapeutic Exercise;Therapeutic Activities;UE/LE Strength taining/ROM;UE/LE Coordination activities  OT Interventions Balance/vestibular training;Cognitive remediation/compensation;Community reintegration;Discharge planning;Functional mobility training;Functional electrical stimulation;DME/adaptive equipment instruction;Neuromuscular re-education;Disease mangement/prevention;Pain management;Patient/family education;Therapeutic Activities;Splinting/orthotics;UE/LE Strength taining/ROM;Therapeutic Exercise;Self Care/advanced ADL retraining;UE/LE Coordination activities  SLP Interventions Cognitive remediation/compensation;Cueing hierarchy;Environmental controls;Functional tasks;Internal/external aids;Medication managment;Oral motor exercises;Patient/family education;Speech/Language facilitation;Therapeutic Activities  TR Interventions    SW/CM Interventions Discharge Planning;Psychosocial Support;Patient/Family Education    Team Discharge Planning: Destination: PT-Home (to pt's dtr's home in [place]) ,OT- Home (to her daughters house in [place]) , [place]- (dtr's house) Projected Follow-up: PT-Home health PT, OT-  Home health OT, SLP-Outpatient SLP Projected Equipment Needs: PT-Rolling walker with 5" wheels (TBD), OT- 3 in 1 bedside comode;Tub/shower bench, SLP-None recommended by SLP Equipment Details: PT- , OT-  Patient/family involved in discharge planning: PT- Patient;Family member/caregiver,  OT-Patient;Family member/caregiver, SLP-Patient;Family member/caregiver  MD ELOS: 10-14  Days Medical Rehab Prognosis:  Excellent Assessment: 62 y.o. right handed female history of migraine headaches as well as diabetes mellitus (insulin pump) who was admitted 02/24/2013 with left-sided weakness,dizziness as  well as fall on her back porch-- found by [facility] when family was unable to get in touch with her. 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 MRI /MRA brain showed acute infarcts in the right basal ganglia and posterior right MCA territory with associated edema and occluded R-MCA beyond origin. Echocardiogram with ejection fraction 70% grade 2 diastolic dysfunction. [name]. Neurology recommended ASA for embolic infarct due to occlusion of R-MCA artery and TEE ordered for workup. TEE revealed PFO and BLE dopplers were positive for L-mid peroneal vein DVT. She was started on xarelto for treatment of DVT  Now requiring 24/7 Rehab RN,MD, as well as CIR level PT, OT and SLP.  Treatment team  will focus on ADLs and mobility with goals set at Sup/Mod I   See Team Conference Notes for weekly updates to the plan of care

## 2013-03-04 NOTE — Progress Notes (Signed)
62 y.o. right handed female history of migraine headaches as well as diabetes mellitus (insulin pump) who was admitted 02/24/2013 with left-sided weakness,dizziness as well as fall on her back porch-- found by [facility] when family was unable to get in touch with her. 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 MRI /MRA brain showed acute infarcts in the right basal ganglia and posterior right MCA territory with associated edema and occluded R-MCA beyond origin. Echocardiogram with ejection fraction 70% grade 2 diastolic dysfunction. [name]. Neurology recommended ASA for embolic infarct due to occlusion of R-MCA artery and TEE ordered for workup. TEE revealed PFO and BLE dopplers were positive for L-mid peroneal vein DVT. On Xarelto  Subjective/Complaints: Slept well, no pain No SOB or CP  Review of Systems - Negative except weakness Left arm and pain Left shouldr when she sleeps on it  Objective: Vital Signs: Blood pressure 137/60, pulse 75, temperature 98.3 F (36.8 C), temperature source Oral, resp. rate 18, weight 117.3 kg (258 lb 9.6 oz), SpO2 94.00%. No results found. Results for orders placed during the hospital encounter of 03/03/13 (from the past 72 hour(s))  GLUCOSE, CAPILLARY     Status: Abnormal   Collection Time    03/03/13  8:32 PM      Result Value Range   Glucose-Capillary 285 (*) 70 - 99 mg/dL  CBC WITH DIFFERENTIAL     Status: Abnormal   Collection Time    03/04/13  5:00 AM      Result Value Range   WBC 7.4  4.0 - 10.5 K/uL   RBC 4.00  3.87 - 5.11 MIL/uL   Hemoglobin 11.4 (*) 12.0 - 15.0 g/dL   HCT 11.9 (*) 14.7 - 82.9 %   MCV 85.3  78.0 - 100.0 fL   MCH 28.5  26.0 - 34.0 pg   MCHC 33.4  30.0 - 36.0 g/dL   RDW 56.2  13.0 - 86.5 %   Platelets 206  150 - 400 K/uL   Neutrophils Relative % 69  43 - 77 %   Neutro Abs 5.1  1.7 - 7.7 K/uL   Lymphocytes Relative 21  12 - 46 %   Lymphs Abs 1.5  0.7 -  4.0 K/uL   Monocytes Relative 8  3 - 12 %   Monocytes Absolute 0.6  0.1 - 1.0 K/uL   Eosinophils Relative 2  0 - 5 %   Eosinophils Absolute 0.2  0.0 - 0.7 K/uL   Basophils Relative 0  0 - 1 %   Basophils Absolute 0.0  0.0 - 0.1 K/uL  COMPREHENSIVE METABOLIC PANEL     Status: Abnormal   Collection Time    03/04/13  5:00 AM      Result Value Range   Sodium 136  135 - 145 mEq/L   Potassium 4.1  3.5 - 5.1 mEq/L   Chloride 100  96 - 112 mEq/L   CO2 24  19 - 32 mEq/L   Glucose, Bld 314 (*) 70 - 99 mg/dL   BUN 12  6 - 23 mg/dL   Creatinine, Ser 7.84  0.50 - 1.10 mg/dL   Calcium 8.9  8.4 - 69.6 mg/dL   Total Protein 6.2  6.0 - 8.3 g/dL   Albumin 2.8 (*) 3.5 - 5.2 g/dL   AST 27  0 - 37 U/L   ALT 28  0 - 35 U/L   Alkaline Phosphatase 111  39 - 117 U/L   Total Bilirubin 0.5  0.3 - 1.2 mg/dL   GFR calc non Af Amer 88 (*) >90 mL/min   GFR calc Af Amer >90  >90 mL/min   Comment: (NOTE)     The eGFR has been calculated using the CKD EPI equation.     This calculation has not been validated in all clinical situations.     eGFR's persistently <90 mL/min signify possible Chronic Kidney     Disease.  GLUCOSE, CAPILLARY     Status: Abnormal   Collection Time    03/04/13  7:32 AM      Result Value Range   Glucose-Capillary 282 (*) 70 - 99 mg/dL   Comment 1 Notify RN       HEENT: normal Cardio: RRR and no Murmur Resp: CTA B/L and unlabored GI: BS positive and non tender Extremity:  Pulses positive and No Edema Skin:   Intact Neuro: Alert/Oriented, Cranial Nerve Abnormalities Left central 7, , Normal Sensory, Abnormal Motor 2-/5 Left bi, tri, trace grip, trace abd, Abnormal FMC Ataxic/ dec FMC and Dysarthric Musc/Skel:  Other no pain with L shoulder ROM Gen NAD   Assessment/Plan: 1. Functional deficits secondary to R MCA embolic infarct  through PFO which require 3+ hours per day of interdisciplinary therapy in a comprehensive inpatient rehab setting. Physiatrist is providing close team  supervision and 24 hour management of active medical problems listed below. Physiatrist and rehab team continue to assess barriers to discharge/monitor patient progress toward functional and medical goals. FIM:                   Comprehension Comprehension Mode: Auditory Comprehension: 5-Understands basic 90% of the time/requires cueing < 10% of the time  Expression Expression Mode: Verbal Expression: 5-Expresses basic 90% of the time/requires cueing < 10% of the time.  Social Interaction Social Interaction: 5-Interacts appropriately 90% of the time - Needs monitoring or encouragement for participation or interaction.  Problem Solving Problem Solving: 5-Solves basic 90% of the time/requires cueing < 10% of the time  Memory Memory: 3-Recognizes or recalls 50 - 74% of the time/requires cueing 25 - 49% of the time  Medical Problem List and Plan:  1. DVT left mid peroneal vein/Anticoagulation: Pharmaceutical: Xarelto  2. Pain Management: prn tylenol effective for pain.  3. Mood: Has good outlook and aware of time needed for recovery. Will have LCSW follow for evaluation.  4. Neuropsych: This patient is capable of making decisions on her own behalf.  5. DM type 2--insulin dependent: On insulin pump at home--add levemir for basal insulin and use SSI as needed. Po intake is good.  6. Migraines: will resume Topamax and inderal at lower dose to avoid recurrence. Titrate as needed.  7. Oral pain: due to impact trial probe as well as sore throat from TEE. Continue lidocaine mouth washt--swish and spit. Pain is beginning to improve  8. Hyperactive airway: resume Advair   LOS (Days) 1 A FACE TO FACE EVALUATION WAS PERFORMED  Patricia Black 03/04/2013, 7:50 AM

## 2013-03-04 NOTE — Evaluation (Signed)
Speech Language Pathology Assessment and Plan  Patient Details  Name: Patricia Black MRN: [id number] Date of Birth: [birth year]  SLP Diagnosis: Dysarthria;Cognitive Impairments  Rehab Potential: Good ELOS: 12-14 days  Today's Date: 03/04/2013 Time: 7829-5621 Time Calculation (min): 57 min  Problem List:  Patient Active Problem List   Diagnosis Date Noted  . PFO (patent foramen ovale) 03/03/2013  . Peroneal DVT (deep venous thrombosis) 03/03/2013  . IMPACT 03/03/2013  . Type II or unspecified type diabetes mellitus without mention of complication, uncontrolled 03/03/2013  . OSA (obstructive sleep apnea) 03/03/2013  . Right shoulder pain 03/03/2013  . CVA (cerebral infarction) 02/24/2013  . CVA (cerebral vascular accident) 02/24/2013  . Hypothermia 02/24/2013  . Hypokalemia 02/24/2013   Past Medical History:  Past Medical History  Diagnosis Date  . Diabetes mellitus without complication   . Migraine   . Sleep apnea   . Mitral valve prolapse    Past Surgical History:  Past Surgical History  Procedure Laterality Date  . Abdominal hysterectomy    . Cholecystectomy    . Tee without cardioversion N/A 03/02/2013    Procedure: TRANSESOPHAGEAL ECHOCARDIOGRAM (TEE);  Surgeon: Lewayne Bunting, MD;  Location: [facility] ENDOSCOPY;  Service: Cardiovascular;  Laterality: N/A;    Assessment / Plan / Recommendation Clinical Impression  Patricia Black is a 62 year old female with recent admission to the hospital on11/26/2014 with left-sided weakness, dizziness as well as fall on her back porch-- found by [facility] when family was unable to get in touch with her. 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 MRI /MRA brain showed acute infarcts in the right basal ganglia and posterior right MCA territory with associated edema and occluded R-MCA beyond origin. Echocardiogram with ejection fraction 70% grade 2 diastolic dysfunction. Patient placed in IMPACT trial. Neurology  recommended ASA for embolic infarct due to occlusion of R-MCA artery and TEE ordered for workup. TEE revealed PFO and BLE dopplers were positive for L-mid peroneal vein DVT. She was started on xarelto for treatment of DVT. Patient with resultant left hemiparesis, left facial weakness as well as LLE instability with difficulty with mobility. Therapy team recommended CIR and patient admitted on 03/03/2013.  Bedside Swallow Evaluation and SLE completed.  Patient demonstrates left labial and lingual weakness which does not impact safety with p.o. intake but does impact speech intelligibility.  Patient demonstrates mild-moderate dysarthria characterized by imprecise consonant production.  Cognition impairments are characterized by decreased recall of new information, emergent awareness and difficulty with initiation, sequencing, problem solving, self-monitoring and correcting.  As a result it is recommended that this patient receive skilled SLP services to address dysarthria and cognition impairments to maximize functional independence and reduce burden of care prior to discharge home with daughter.      SLP Assessment  Patient will need skilled Speech Lanaguage Pathology Services during CIR admission    Recommendations  Diet Recommendations: Regular;Thin liquid Liquid Administration via: Cup;Straw Medication Administration: Whole meds with liquid Supervision: Intermittent supervision to cue for compensatory strategies Compensations: Slow rate;Small sips/bites Postural Changes and/or Swallow Maneuvers: Seated upright 90 degrees Oral Care Recommendations: Oral care BID Patient destination:  (dtr's house) Follow up Recommendations: Outpatient SLP Equipment Recommended: None recommended by SLP    SLP Frequency 5 out of 7 days   SLP Treatment/Interventions Cognitive remediation/compensation;Cueing hierarchy;Environmental controls;Functional tasks;Internal/external aids;Medication managment;Oral motor  exercises;Patient/family education;Speech/Language facilitation;Therapeutic Activities    Pain Pain Assessment Pain Assessment: No/denies pain Prior Functioning Cognitive/Linguistic Baseline: Within functional limits Vocation: Full  time employment  Short Term Goals: Week 1: SLP Short Term Goal 1 (Week 1): Patient will identify 2/3 effective speech intelligibilty strategies with Supervision level question cues SLP Short Term Goal 2 (Week 1): Patient will self-monitor and correct speech intelligibility errors with Min verbal cues SLP Short Term Goal 3 (Week 1): Patient will initiate, sequence and solve moderately complex problems with Min verbal cues  See FIM for current functional status Refer to Care Plan for Long Term Goals  Recommendations for other services: None  Discharge Criteria: Patient will be discharged from SLP if patient refuses treatment 3 consecutive times without medical reason, if treatment goals not met, if there is a change in medical status, if patient makes no progress towards goals or if patient is discharged from hospital.  The above assessment, treatment plan, treatment alternatives and goals were discussed and mutually agreed upon: by patient and by family  [name], CCC-SLP [phone]  Patricia Black 03/04/2013, 3:00 PM

## 2013-03-04 NOTE — Progress Notes (Signed)
Inpatient Rehabilitation Center Individual Statement of Services  Patient Name:  Patricia Black  Date:  03/04/2013  Welcome to the Inpatient Rehabilitation Center.  Our goal is to provide you with an individualized program based on your diagnosis and situation, designed to meet your specific needs.  With this comprehensive rehabilitation program, you will be expected to participate in at least 3 hours of rehabilitation therapies Monday-Friday, with modified therapy programming on the weekends.  Your rehabilitation program will include the following services:  Physical Therapy (PT), Occupational Therapy (OT), Speech Therapy (ST), 24 hour per day rehabilitation nursing, Therapeutic Recreation (TR), Neuropsychology, Case Management (Social Worker), Rehabilitation Medicine, Nutrition Services and Pharmacy Services  Weekly team conferences will be held on Wednesdays to discuss your progress.  Your Social Worker will talk with you frequently to get your input and to update you on team discussions.  Team conferences with you and your family in attendance may also be held.  Expected length of stay: 12-16 days  Overall anticipated outcome: Supervision  Depending on your progress and recovery, your program may change. Your Social Worker will coordinate services and will keep you informed of any changes. Your Social Worker's name and contact numbers are listed  below.  The following services may also be recommended but are not provided by the Inpatient Rehabilitation Center:   Driving Evaluations  Home Health Rehabiltiation Services  Outpatient Rehabilitation Services  Vocational Rehabilitation   Arrangements will be made to provide these services after discharge if needed.  Arrangements include referral to agencies that provide these services.  Your insurance has been verified to be:  [place] Your primary doctor is:  Dr. Einar Crow  Pertinent information will be shared with your doctor and  your insurance company.  Social Worker:  Staci Acosta, LCSW  [phone] or ([phone]  Information discussed with and copy given to patient by: Elvera Lennox, 03/04/2013, 1:59 PM

## 2013-03-04 NOTE — Progress Notes (Signed)
Inpatient Diabetes Program Recommendations  AACE/ADA: New Consensus Statement on Inpatient Glycemic Control (2013)  Target Ranges:  Prepandial:   less than 140 mg/dL      Peak postprandial:   less than 180 mg/dL (1-2 hours)      Critically ill patients:  140 - 180 mg/dL   Results for JASSICA, ZAZUETA (MRN [id number]) as of 03/04/2013 11:39  Ref. Range 03/03/2013 06:23 03/03/2013 11:50 03/03/2013 16:33 03/03/2013 20:32 03/04/2013 07:32  Glucose-Capillary Latest Range: 70-99 mg/dL 086 (H) 578 (H) 469 (H) 285 (H) 282 (H)    Please add lantus or levemir, starting with 20 units qd Thank you, Lenor Coffin, RN, CNS, Diabetes Coordinator [phone])

## 2013-03-04 NOTE — Evaluation (Signed)
Occupational Therapy Assessment and Plan  Patient Details  Name: Patricia Black MRN: [id number] Date of Birth: 10-30-1950  OT Diagnosis: abnormal posture, cognitive deficits, disturbance of vision, hemiplegia affecting dominant side and muscle weakness (generalized) Rehab Potential: Rehab Potential: Excellent ELOS: 14-16 days   Today's Date: 03/04/2013 Time: 7829-5621 Time Calculation (min): 68 min  Problem List:  Patient Active Problem List   Diagnosis Date Noted  . PFO (patent foramen ovale) 03/03/2013  . Peroneal DVT (deep venous thrombosis) 03/03/2013  . IMPACT 03/03/2013  . Type II or unspecified type diabetes mellitus without mention of complication, uncontrolled 03/03/2013  . OSA (obstructive sleep apnea) 03/03/2013  . Right shoulder pain 03/03/2013  . CVA (cerebral infarction) 02/24/2013  . CVA (cerebral vascular accident) 02/24/2013  . Hypothermia 02/24/2013  . Hypokalemia 02/24/2013    Past Medical History:  Past Medical History  Diagnosis Date  . Diabetes mellitus without complication   . Migraine   . Sleep apnea   . Mitral valve prolapse    Past Surgical History:  Past Surgical History  Procedure Laterality Date  . Abdominal hysterectomy    . Cholecystectomy    . Tee without cardioversion N/A 03/02/2013    Procedure: TRANSESOPHAGEAL ECHOCARDIOGRAM (TEE);  Surgeon: Lewayne Bunting, MD;  Location: [facility] ENDOSCOPY;  Service: Cardiovascular;  Laterality: N/A;    Assessment & Plan Clinical Impression: Patient is a 62 y.o. year old female with recent admission to the hospital on11/26/2014 with left-sided weakness,dizziness as well as fall on her back porch-- found by [facility] when family was unable to get in touch with her. 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 MRI /MRA brain showed acute infarcts in the right basal ganglia and posterior right MCA territory with associated edema and occluded R-MCA beyond origin.  Patient transferred to  CIR on 03/03/2013 .    Patient currently requires max with basic self-care skills secondary to muscle weakness, impaired timing and sequencing, abnormal tone, unbalanced muscle activation, decreased coordination and decreased motor planning, field cut and decreased initiation, decreased awareness, decreased problem solving, decreased safety awareness, decreased memory and delayed processing.  Prior to hospitalization, patient could complete ADLs with independent .  Patient will benefit from skilled intervention to decrease level of assist with basic self-care skills and increase independence with basic self-care skills prior to discharge home with her daughter who can provide 24 hour supervision.  Anticipate patient will require 24 hour supervision and follow up home health.  OT - End of Session Activity Tolerance: Tolerates 30+ min activity with multiple rests Endurance Deficit: No OT Assessment Rehab Potential: Excellent OT Patient demonstrates impairments in the following area(s): Balance;Cognition;Motor;Perception;Safety;Sensory;Vision OT Basic ADL's Functional Problem(s): Eating;Grooming;Bathing;Dressing;Toileting OT Advanced ADL's Functional Problem(s): Simple Meal Preparation OT Transfers Functional Problem(s): Toilet;Tub/Shower OT Additional Impairment(s): Fuctional Use of Upper Extremity OT Plan OT Intensity: Minimum of 1-2 x/day, 45 to 90 minutes OT Duration/Estimated Length of Stay: 14-16 days OT Treatment/Interventions: Balance/vestibular training;Cognitive remediation/compensation;Community reintegration;Discharge planning;Functional mobility training;Functional electrical stimulation;DME/adaptive equipment instruction;Neuromuscular re-education;Disease mangement/prevention;Pain management;Patient/family education;Therapeutic Activities;Splinting/orthotics;UE/LE Strength taining/ROM;Therapeutic Exercise;Self Care/advanced ADL retraining;UE/LE Coordination activities OT Self Feeding  Anticipated Outcome(s): modified independent OT Basic Self-Care Anticipated Outcome(s): supervision OT Toileting Anticipated Outcome(s): supervision OT Bathroom Transfers Anticipated Outcome(s): supervision OT Recommendation Patient destination: Home (to her daughters house in [place]) Follow Up Recommendations: Home health OT Equipment Recommended: 3 in 1 bedside comode;Tub/shower bench   OT Evaluation Precautions/Restrictions  Precautions Precautions: Fall Precaution Comments: Impulsive, L hemiplegia, possible left visual field deficit Restrictions Weight  Bearing Restrictions: No  Pain Pain Assessment Pain Assessment: No/denies pain Home Living/Prior Functioning Home Living Available Help at Discharge: Family;Available 24 hours/day (Dtr works full time, son-in-law works from home and will be Firefighter to assist) Type of Home: [employer] Access: Stairs to enter [employer] of Steps: 5 Entrance Stairs-Rails: Right Home Layout: Multi-level;Other (Comment) (Pt to stay in basement level) Alternate Level Stairs-Number of Steps: 11 Alternate Level Stairs-Rails: Can reach both  Lives With: Alone (Pt to d/c to dtr's home in [place]) Prior Function Level of Independence: Independent with basic ADLs;Independent with homemaking with ambulation;Independent with gait;Independent with transfers  Able to Take Stairs?: Yes Driving: Yes Vocation: Full time employment (IT) Leisure: Hobbies-yes (Comment) (sewing, reading) ADL  See FIM scale  Vision/Perception  Vision - History Baseline Vision: Wears glasses all the time Visual History: Cataracts Patient Visual Report: No change from baseline Vision - Assessment Eye Alignment: Within Functional Limits Vision Assessment: Vision tested Tracking/Visual Pursuits: Decreased smoothness of vertical tracking Visual Fields: Left visual field deficit Additional Comments: Pt unable to consistently detect moving object presented in her  left visual field. Perception Perception: Within Functional Limits Praxis Praxis: Intact  Cognition Overall Cognitive Status: Impaired/Different from baseline Arousal/Alertness: Awake/alert Orientation Level: Oriented X4 Attention: Sustained Focused Attention: Appears intact Sustained Attention: Appears intact Selective Attention: Appears intact Alternating Attention: Appears intact Memory: Appears intact Awareness: Impaired Awareness Impairment: Emergent impairment Problem Solving: Impaired Executive Function: Sequencing;Initiating Sequencing: Impaired Initiating: Impaired Behaviors: Impulsive Safety/Judgment: Impaired Comments: Pt with decreased initiation and processing with bathing tasks.  Pt stated she wanted to wash her hair but even when shown where the shampoo was she would not initiate the task and continued to pick up the hand held shower rinse off her face then put it back.  After 3 attempts to initiate task therapist provided insturction to put the shampoo on her head and wash her hair.  Also as she finished washing her hair therapist asked her what she needed to do next and she said she was done, even though she had not washed any parts of her body.   Sensation Sensation Light Touch: Appears Intact Stereognosis: Not tested Hot/Cold: Not tested Proprioception: Appears Intact Coordination Gross Motor Movements are Fluid and Coordinated: No Fine Motor Movements are Fluid and Coordinated: No Coordination and Movement Description: Pt currently with Brunnstrum stage II movement in the left arm and hand. Motor  Motor Motor: Hemiplegia;Abnormal postural alignment and control Motor - Skilled Clinical Observations: Pt with limited active functional use in the LUE.  Pt attempted to incoporate it into function however needs max assist to do so with bathing tasks. Mobility  Bed Mobility Rolling Right: 3: Mod assist Rolling Right Details: Tactile cues for sequencing;Verbal  cues for technique Rolling Left: 4: Min guard;4: Min assist Rolling Left Details: Tactile cues for sequencing;Verbal cues for technique;Verbal cues for precautions/safety Left Sidelying to Sit: 4: Min assist Left Sidelying to Sit Details: Tactile cues for sequencing;Verbal cues for sequencing;Verbal cues for technique Supine to Sit: 4: Min assist Supine to Sit Details: Tactile cues for sequencing;Verbal cues for sequencing;Verbal cues for technique Sit to Supine: 4: Min assist Sit to Supine - Details: Tactile cues for initiation;Tactile cues for sequencing;Verbal cues for sequencing;Verbal cues for technique Transfers Transfers: Sit to Stand;Stand to Sit Sit to Stand: 3: Mod assist;With upper extremity assist;Other (comment) (from tub bench) Sit to Stand Details: Verbal cues for technique;Verbal cues for safe use of DME/AE Sit to Stand Details (indicate cue type and  reason): Pt impulsive, trying to stand up prio to PT ready/in place for tsf. Requires cues for hand placement.,  Stand to Sit: 3: Mod assist;With upper extremity assist;To chair/3-in-1 Stand to Sit Details (indicate cue type and reason): Tactile cues for sequencing;Tactile cues for placement;Verbal cues for sequencing  Trunk/Postural Assessment  Cervical Assessment Cervical Assessment: Within Functional Limits Thoracic Assessment Thoracic Assessment: Within Functional Limits Lumbar Assessment Lumbar Assessment: Within Functional Limits Postural Control Postural Control: Deficits on evaluation Trunk Control: Pt sits in a posterior pelvic tilt with increased weight bearing slightly to the left side in sitting.  Slight left trunk passive elongation also noted. Protective Responses: delayed. Pt had x1 LOB during ambulation w/HHA.   Balance Balance Balance Assessed: Yes Static Sitting Balance Static Sitting - Balance Support: Right upper extremity supported;Left upper extremity supported Static Sitting - Level of Assistance: 5:  Stand by assistance Dynamic Sitting Balance Dynamic Sitting - Balance Support: No upper extremity supported Dynamic Sitting - Level of Assistance: 5: Stand by assistance (Pt with LOB to the left with reaching beyond BOS to the left with the RUE.) Static Standing Balance Static Standing - Level of Assistance: 4: Min assist Dynamic Standing Balance Dynamic Standing - Level of Assistance: 3: Mod assist Extremity/Trunk Assessment RUE Assessment RUE Assessment: Within Functional Limits LUE Assessment LUE Assessment: Exceptions to [facility] LUE Strength LUE Overall Strength Comments: Pt is currently brunnstrum stage II movement in the left arm and hand.  PROM WFLs for all joints, noted slight flexion tone in the elbow and fingers.    FIM:  FIM - Grooming Grooming Steps: Wash, rinse, dry face;Brush, comb hair Grooming: 4: Patient completes 3 of 4 or 4 of 5 steps FIM - Bathing Bathing Steps Patient Completed: Chest;Left Arm;Abdomen;Right upper leg;Left upper leg Bathing: 3: Mod-Patient completes 5-7 35f 10 parts or 50-74% FIM - Upper Body Dressing/Undressing Upper body dressing/undressing steps patient completed: Thread/unthread right sleeve of pullover shirt/dresss Upper body dressing/undressing: 2: Max-Patient completed 25-49% of tasks FIM - Lower Body Dressing/Undressing Lower body dressing/undressing steps patient completed: Thread/unthread right pants leg;Thread/unthread right underwear leg Lower body dressing/undressing: 2: Max-Patient completed 25-49% of tasks FIM - Press photographer Assistive Devices: Arm rests Bed/Chair Transfer: 4: Supine > Sit: Min A (steadying Pt. > 75%/lift 1 leg);4: Sit > Supine: Min A (steadying pt. > 75%/lift 1 leg);3: Bed > Chair or W/C: Mod A (lift or lower assist);3: Chair or W/C > Bed: Mod A (lift or lower assist) FIM - Tub/Shower Transfers Tub/Shower Assistive Devices: Tub transfer bench;Grab bars Tub/shower Transfers: 3-Into Tub/Shower:  Mod A (lift or lower/lift 2 legs);3-Out of Tub/Shower: Mod A (lift or lower/lift 2 legs)   Refer to Care Plan for Long Term Goals  Recommendations for other services: None  Discharge Criteria: Patient will be discharged from OT if patient refuses treatment 3 consecutive times without medical reason, if treatment goals not met, if there is a change in medical status, if patient makes no progress towards goals or if patient is discharged from hospital.  The above assessment, treatment plan, treatment alternatives and goals were discussed and mutually agreed upon: by patient and by family  Pt began education on selfcare retraining, balance,  LUE positioning and functional use during selfcare tasks.  Currently needs max assist for bathing and dressing tasks.    Faruq Rosenberger OTR/L 03/04/2013, 12:26 PM

## 2013-03-04 NOTE — Evaluation (Addendum)
Physical Therapy Assessment and Plan  Patient Details  Name: Patricia Black MRN: [id number] Date of Birth: 07/03/1950  PT Diagnosis: Abnormality of gait, Coordination disorder, Difficulty walking, Hemiparesis non-dominant, Impaired cognition and Muscle weakness Rehab Potential: Good ELOS: 12-14 days   Today's Date: 03/04/2013 Time: 0830-0925 Time Calculation (min): 55 min  Problem List:  Patient Active Problem List   Diagnosis Date Noted  . PFO (patent foramen ovale) 03/03/2013  . Peroneal DVT (deep venous thrombosis) 03/03/2013  . IMPACT 03/03/2013  . Type II or unspecified type diabetes mellitus without mention of complication, uncontrolled 03/03/2013  . OSA (obstructive sleep apnea) 03/03/2013  . Right shoulder pain 03/03/2013  . CVA (cerebral infarction) 02/24/2013  . CVA (cerebral vascular accident) 02/24/2013  . Hypothermia 02/24/2013  . Hypokalemia 02/24/2013    Past Medical History:  Past Medical History  Diagnosis Date  . Diabetes mellitus without complication   . Migraine   . Sleep apnea   . Mitral valve prolapse    Past Surgical History:  Past Surgical History  Procedure Laterality Date  . Abdominal hysterectomy    . Cholecystectomy    . Tee without cardioversion N/A 03/02/2013    Procedure: TRANSESOPHAGEAL ECHOCARDIOGRAM (TEE);  Surgeon: Lewayne Bunting, MD;  Location: [facility] ENDOSCOPY;  Service: Cardiovascular;  Laterality: N/A;    Assessment & Plan Clinical Impression: 62 y.o. right handed female history of migraine headaches as well as diabetes mellitus (insulin pump) who was admitted 02/24/2013 with left-sided weakness,dizziness as well as fall on her back porch-- found by [facility] when family was unable to get in touch with her. 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 MRI /MRA brain showed acute infarcts in the right basal ganglia and posterior right MCA territory with associated edema and occluded R-MCA beyond origin.  Echocardiogram with ejection fraction 70% grade 2 diastolic dysfunction. [name]. Neurology recommended ASA for embolic infarct due to occlusion of R-MCA artery and TEE ordered for workup. TEE revealed PFO and BLE dopplers were positive for L-mid peroneal vein DVT. On Xarelto     Patient currently requires mod with mobility secondary to muscle weakness and impaired timing and sequencing, decreased coordination and decreased motor planning.  Prior to hospitalization, patient was independent  with mobility and lived with Alone (Pt to d/c to dtr's home in [place]) in a House home.  Home access is 5Stairs to enter.  Patient will benefit from skilled PT intervention to minimize fall risk and decrease caregiver burden for planned discharge home with 24 hour assist.  Anticipate patient will benefit from follow up [facility] at discharge.  PT - End of Session Activity Tolerance: Tolerates 40 min rest with activity.  Endurance Deficit: No PT Assessment Rehab Potential: Good Barriers to Discharge: Inaccessible home environment Barriers to Discharge Comments: Pt plans to d/c to dtr's home in [place]. Dtr works full time, but son-in-law works from home and can assist. Pt will be staying in basement w/11 stairs, B rails to access.  PT Patient demonstrates impairments in the following area(s): Balance;Edema;Endurance;Motor;Perception;Safety PT Transfers Functional Problem(s): Bed Mobility;Bed to Chair;Car;Furniture PT Locomotion Functional Problem(s): Ambulation;Stairs PT Plan PT Intensity: Minimum of 1-2 x/day ,45 to 90 minutes PT Frequency: 5 out of 7 days PT Duration Estimated Length of Stay: 14-18 days PT Treatment/Interventions: Ambulation/gait training;Balance/vestibular training;Community reintegration;Discharge planning;Disease management/prevention;DME/adaptive equipment instruction;Functional mobility training;Neuromuscular re-education;Patient/family education;Pain  management;Psychosocial support;Splinting/orthotics;Stair training;Therapeutic Exercise;Therapeutic Activities;UE/LE Strength taining/ROM;UE/LE Coordination activities PT Transfers Anticipated Outcome(s): mod  I PT Locomotion Anticipated Outcome(s): supervision PT Recommendation Follow Up Recommendations: Home health PT Patient destination: Home (to pt's dtr's home in [place]) Equipment Recommended: Rolling walker with 5" wheels (TBD)  Skilled Therapeutic Intervention Pt performed gait training in the hallway with use of R railing, requiring min/mod A and verbal/tactile cues to promote increased hip, knee and heel strike of the LLE. Cues to clear LLE during swing phase. Pt ambulated 25 feet x2 with short rest break after each trial. Pt negotiated up/down 5 steps using a R rail, mod A and verba/tactile cues. Pt assisted back to her room, transferring into recliner chair, performing stand pivot tsf with min A and verbal cues.  PT Evaluation Precautions/Restrictions Precautions Precautions: Fall Precaution Comments: Impulsive, L hemiplegia Restrictions Weight Bearing Restrictions: No General Chart Reviewed: Yes Family/Caregiver Present: No Vital Signs  Pain Pain Assessment Pain Assessment: No/denies pain Home Living/Prior Functioning Home Living Available Help at Discharge: Family;Available 24 hours/day (Dtr works full time, son-in-law works from home and will be Firefighter to assist) Type of Home: [employer] Access: Stairs to enter [employer] of Steps: 5 Entrance Stairs-Rails: Right Home Layout: Multi-level;Other (Comment) (Pt to stay in basement level) Alternate Level Stairs-Number of Steps: 11 Alternate Level Stairs-Rails: Can reach both  Lives With: Alone (Pt to d/c to dtr's home in [place]) Prior Function Level of Independence: Independent with basic ADLs;Independent with homemaking with ambulation;Independent with gait;Independent with transfers  Able to Take Stairs?:  Yes Driving: Yes Vocation: Full time employment (IT) Leisure: Hobbies-yes (Comment) (sewing, reading) Vision/Perception  Vision - History Baseline Vision: Wears glasses all the time (pt reports scheduled L cataract sx to be rescheduled post d/c) Visual History: Cataracts Patient Visual Report: No change from baseline  Cognition Overall Cognitive Status: Impaired/Different from baseline Arousal/Alertness: Awake/alert Orientation Level: Oriented X4 Attention: Focused Focused Attention: Appears intact Selective Attention: Appears intact Alternating Attention: Appears intact Memory: Appears intact Awareness: Impaired Awareness Impairment: Emergent impairment Problem Solving: Appears intact Sequencing: Appears intact Behaviors: Impulsive Safety/Judgment: Impaired Comments: Note pt attempting to get out of recliner in room without asking for assist and getting up in gym prior to therapist ready Sensation Sensation Light Touch: Appears Intact Coordination Gross Motor Movements are Fluid and Coordinated: No Coordination and Movement Description: L hemiplegia, slow movement Motor  Motor Motor: Hemiplegia;Abnormal postural alignment and control Motor - Skilled Clinical Observations: L hemiplegia LUE>LLE. note minimal L lateral lean in sitting and standing.  Mobility Bed Mobility Rolling Right: 3: Mod assist Rolling Right Details: Tactile cues for sequencing;Verbal cues for technique Rolling Left: 4: Min guard;4: Min assist Rolling Left Details: Tactile cues for sequencing;Verbal cues for technique;Verbal cues for precautions/safety Left Sidelying to Sit: 4: Min assist Left Sidelying to Sit Details: Tactile cues for sequencing;Verbal cues for sequencing;Verbal cues for technique Supine to Sit: 4: Min assist Supine to Sit Details: Tactile cues for sequencing;Verbal cues for sequencing;Verbal cues for technique Sit to Supine: 4: Min assist Sit to Supine - Details: Tactile cues for  initiation;Tactile cues for sequencing;Verbal cues for sequencing;Verbal cues for technique Transfers Transfers: Yes Sit to Stand: 3: Mod assist Sit to Stand Details: Tactile cues for initiation;Tactile cues for placement;Verbal cues for technique;Verbal cues for sequencing Sit to Stand Details (indicate cue type and reason): Pt impulsive, trying to stand up prio to PT ready/in place for tsf. Requires cues for hand placement.,  Stand to Sit: 4: Min assist;With upper extremity assist Stand to Sit Details (indicate cue type and reason): Tactile cues for sequencing;Tactile  cues for placement;Verbal cues for sequencing Stand Pivot Transfers: 3: Mod assist Stand Pivot Transfer Details: Tactile cues for sequencing;Tactile cues for placement;Verbal cues for technique;Verbal cues for sequencing Stand Pivot Transfer Details (indicate cue type and reason): minA/mod A. Pt demonstrates safe technique at min A when performed slower.  Locomotion  Ambulation Ambulation: Yes Ambulation/Gait Assistance: 2: Max assist;4: Min assist Ambulation Distance (Feet):  (25 feet x2 along R rail in hallway with min A) Assistive device:  (inital HHA, R rail x2 trials) Ambulation/Gait Assistance Details: Tactile cues for sequencing;Tactile cues for placement;Verbal cues for sequencing;Verbal cues for precautions/safety;Verbal cues for technique;Verbal cues for gait pattern Ambulation/Gait Assistance Details: HHA attempted, pt demonstrates LLE knee buckling, requiring max A to correct and sit down on mat table. Pt then able to ambulated 25 feeet x2 along R rail in hallway w/min A and verba//tactile cues. No knee buckling noted.  Gait Gait: Yes Gait Pattern: Impaired Gait Pattern: Decreased step length - left;Decreased dorsiflexion - left;Decreased weight shift to right;Decreased stance time - left;Step-to pattern Gait velocity: Pt demonstrates decreased clearance of LLE, heel strike, knee flexion and hip flexion. No positive  knee buckling noted during ambulation along R rail. tactile cues given for quad activation.  Stairs / Additional Locomotion Stairs: Yes Stairs Assistance: 3: Mod assist Stairs Assistance Details: Tactile cues for sequencing;Tactile cues for weight shifting;Tactile cues for placement;Verbal cues for technique;Verbal cues for precautions/safety Stair Management Technique: One rail Right Number of Stairs: 5 Wheelchair Mobility Wheelchair Mobility: Yes Wheelchair Assistance: 4: Min assist Wheelchair Assistance Details: Verbal cues for technique;Verbal cues for sequencing;Verbal cues for precautions/safety Wheelchair Propulsion: Right upper extremity;Both lower extermities Wheelchair Parts Management: Needs assistance Distance: Pt propeled standard light weight wheel chair 150 feet on level surfaces using BLEs and RUE, with min A to negotiate turns and obstacles.   Trunk/Postural Assessment  Cervical Assessment Cervical Assessment: Within Functional Limits Thoracic Assessment Thoracic Assessment: Within Functional Limits Lumbar Assessment Lumbar Assessment: Within Functional Limits Postural Control Postural Control: Deficits on evaluation Trunk Control: L lateral lean noted in sitting and standing. Pt able to self correct with verbal cues.  Protective Responses: delayed. Pt had x1 LOB during ambulation w/HHA.   Balance Balance Balance Assessed: Yes Static Sitting Balance Static Sitting - Balance Support: Feet supported;Right upper extremity supported Static Sitting - Level of Assistance: 5: Stand by assistance Dynamic Sitting Balance Dynamic Sitting - Level of Assistance: 5: Stand by assistance (Pt leans L, but able to self correct with cues. ) Static Standing Balance Static Standing - Level of Assistance: 4: Min assist Dynamic Standing Balance Dynamic Standing - Level of Assistance: 3: Mod assist Extremity Assessment      RLE Assessment RLE Assessment: Within Functional Limits  (grossly 4/5) LLE Assessment LLE Assessment:  (grossly 3+/5. knee flexion 2+/5, dorsiflexion 3/5)  FIM:  FIM - Bed/Chair Transfer Bed/Chair Transfer Assistive Devices: Arm rests Bed/Chair Transfer: 4: Supine > Sit: Min A (steadying Pt. > 75%/lift 1 leg);4: Sit > Supine: Min A (steadying pt. > 75%/lift 1 leg);3: Bed > Chair or W/C: Mod A (lift or lower assist);3: Chair or W/C > Bed: Mod A (lift or lower assist) FIM - Locomotion: Wheelchair Distance: Pt propeled standard light weight wheel chair 150 feet on level surfaces using BLEs and RUE, with min A to negotiate turns and obstacles.  Locomotion: Wheelchair: 4: Travels 150 ft or more: maneuvers on rugs and over door sillls with minimal assistance (Pt.>75%) FIM - Locomotion: Ambulation Locomotion: Ambulation Assistive Devices:  (  x1 HHA, x1 R hand rail) Ambulation/Gait Assistance: 2: Max assist;4: Min assist Locomotion: Ambulation: 1: Two helpers (ambulated 25 feet with R hand rail, min A w/wheel chair follow) FIM - Locomotion: Stairs Locomotion: Building control surveyor: Hand rail - 1 Locomotion: Stairs: 2: Up and Down 4 - 11 stairs with moderate assistance (Pt: 50 - 74%)   Refer to Care Plan for Long Term Goals  Recommendations for other services: None  Discharge Criteria: Patient will be discharged from PT if patient refuses treatment 3 consecutive times without medical reason, if treatment goals not met, if there is a change in medical status, if patient makes no progress towards goals or if patient is discharged from hospital.  The above assessment, treatment plan, treatment alternatives and goals were discussed and mutually agreed upon: by patient and by family  Delrae Rend R 03/04/2013, 11:24 AM   Refer to Skilled Intervention section for PT treatment.

## 2013-03-04 NOTE — Progress Notes (Signed)
Patient information reviewed and entered into eRehab system by Natalio Salois, RN, CRRN, PPS Coordinator.  Information including medical coding and functional independence measure will be reviewed and updated through discharge.    

## 2013-03-04 NOTE — Progress Notes (Signed)
Occupational Therapy Session Note  Patient Details  Name: Patricia Black MRN: [id number] Date of Birth: September 27, 1950  Today's Date: 03/04/2013 Time: 8295-6213 Time Calculation (min): 32 min  Short Term Goals: Week 1:  OT Short Term Goal 1 (Week 1): Pt will perform UB dressing with supervision and mod instructional cueing. OT Short Term Goal 2 (Week 1): Pt will donn pullover underpants and pants with min assist sit to stand. OT Short Term Goal 3 (Week 1): Pt will perform toilet transfers with min assist to elevated toilet/3:1. OT Short Term Goal 4 (Week 1): Pt will use the LUE as a stabilizer with min assist during bathing tasks. OT Short Term Goal 5 (Week 1): Pt will initiate and perform all bathing with no more than min instructional cueing during session.  Skilled Therapeutic Interventions/Progress Updates:   Pt worked on Medical sales representative during session for the LUE.  Had pt place her hand on top of a basketball and work on small controlled movements with emphasis on shoulder internal and external rotation as well as shoulder flexion and extension.  She needed max assist to facilitate slight shoulder flexion on the ball however she was able to perform internal and external rotation without assistance.  Transitioned pt back to room via wheelchair and provided handout and education on AAROM exercises for the LUE.    Therapy Documentation Precautions:  Precautions Precautions: Fall Precaution Comments: Impulsive, L hemiplegia, possible left visual field deficit Restrictions Weight Bearing Restrictions: No  Pain: Pain Assessment Pain Assessment: No/denies pain  See FIM for current functional status  Therapy/Group: Individual Therapy  Josalynn Johndrow OTR/L 03/04/2013, 3:47 PM

## 2013-03-05 ENCOUNTER — Inpatient Hospital Stay (HOSPITAL_COMMUNITY): Payer: Self-pay | Admitting: Rehabilitation

## 2013-03-05 ENCOUNTER — Inpatient Hospital Stay (HOSPITAL_COMMUNITY): Payer: Self-pay | Admitting: Occupational Therapy

## 2013-03-05 ENCOUNTER — Inpatient Hospital Stay (HOSPITAL_COMMUNITY): Payer: Managed Care, Other (non HMO)

## 2013-03-05 ENCOUNTER — Inpatient Hospital Stay (HOSPITAL_COMMUNITY): Payer: Managed Care, Other (non HMO) | Admitting: Occupational Therapy

## 2013-03-05 DIAGNOSIS — G8110 Spastic hemiplegia affecting unspecified side: Secondary | ICD-10-CM

## 2013-03-05 DIAGNOSIS — I6330 Cerebral infarction due to thrombosis of unspecified cerebral artery: Secondary | ICD-10-CM

## 2013-03-05 LAB — GLUCOSE, CAPILLARY
Glucose-Capillary: 315 mg/dL — ABNORMAL HIGH (ref 70–99)
Glucose-Capillary: 311 mg/dL — ABNORMAL HIGH (ref 70–99)

## 2013-03-05 MED ORDER — INSULIN ASPART 100 UNIT/ML ~~LOC~~ SOLN
5.0000 [IU] | Freq: Three times a day (TID) | SUBCUTANEOUS | Status: DC
  Administered 2013-03-05 – 2013-03-08 (×9): 5 [IU] via SUBCUTANEOUS

## 2013-03-05 MED ORDER — INSULIN ASPART 100 UNIT/ML ~~LOC~~ SOLN
5.0000 [IU] | Freq: Three times a day (TID) | SUBCUTANEOUS | Status: DC
  Administered 2013-03-05: 5 [IU] via SUBCUTANEOUS

## 2013-03-05 MED ORDER — LORATADINE 10 MG PO TABS
10.0000 mg | ORAL_TABLET | Freq: Every day | ORAL | Status: DC
  Administered 2013-03-05 – 2013-03-20 (×15): 10 mg via ORAL
  Filled 2013-03-05 (×20): qty 1

## 2013-03-05 MED ORDER — INSULIN DETEMIR 100 UNIT/ML ~~LOC~~ SOLN
10.0000 [IU] | Freq: Every day | SUBCUTANEOUS | Status: DC
  Administered 2013-03-05: 10 [IU] via SUBCUTANEOUS
  Filled 2013-03-05 (×2): qty 0.1

## 2013-03-05 MED ORDER — FLUTICASONE PROPIONATE 50 MCG/ACT NA SUSP
1.0000 | Freq: Every day | NASAL | Status: DC
  Administered 2013-03-05 – 2013-03-19 (×15): 1 via NASAL
  Filled 2013-03-05: qty 16

## 2013-03-05 NOTE — Progress Notes (Signed)
Occupational Therapy Session Note  Patient Details  Name: Patricia Black MRN: [id number] Date of Birth: 12-Apr-1950  Today's Date: 03/05/2013 Time: 7829-5621 Time Calculation (min): 30 min   Skilled Therapeutic Interventions/Progress Updates:    Took pt down to the therapy gym and transferred to the mat with mod assist stand pivot.  Worked on Chief of Staff for the LUE.  Pt was able to push tilted stool with the left shoulder to target with min guard assist and occasional min assist to avoid trunk compensations.  Progressed to elevating tilted stool on higher surface which was slightly more difficult but pt was able to complete with min assist.  Note increased flexor tone in the digits and also increased tone in the pectoral with attempted shoulder movements.  Transitioned to supine and working on AAROM shoulder flexion with target positioned out to the left to help promote flexion without pectoral activation.  Pt needing mod assist to complete repetitions.  Finished session by stabilizing arm in 90 degrees shoulder flexion and having pt work on isolated elbow extension.  Pt able to complete extension with min assist for guiding.  Provided pt with handout on positioning in the bed as pt asked if it was OK for her to sleep on her left side.  Talked with PA and requested resting hand splint for pt to wear at night on the left hand to help with positioning.  Therapy Documentation Precautions:  Precautions Precautions: Fall Precaution Comments: Impulsive, L hemiplegia, possible left visual field deficit Restrictions Weight Bearing Restrictions: No  Pain: Pain Assessment Pain Assessment: No/denies pain  See FIM for current functional status  Therapy/Group: Individual Therapy  Zamari Vea OTR/L 03/05/2013, 3:49 PM

## 2013-03-05 NOTE — Progress Notes (Signed)
Physical Therapy Session Note  Patient Details  Name: Patricia Black MRN: [id number] Date of Birth: 06-Jul-1950  Today's Date: 03/05/2013 Time: 0830-0928 Time Calculation (min): 58 min  Short Term Goals: Week 1:  PT Short Term Goal 1 (Week 1): Pt will be supervision with Transfers (bed<>chair) PT Short Term Goal 2 (Week 1): Pt will be able to ambulate 100 feet using LRAD at min A and min cues.  PT Short Term Goal 3 (Week 1): Pt will be able to negotiate up/down 8 steps using a right railing with min A and min cues.   Skilled Therapeutic Interventions/Progress Updates:   Pt received lying in bed this morning, receiving meds from RN.  Performed supine > sit at mod assist to elevate trunk into sitting position.  HOB flat with use of handrails this morning, however note difficulty elevating trunk also due to body habitus.  Performed several reps of stand pivot transfers during session at mod assist.  See full details below.  Also performed standing NMR as well as sit <> stand reps for increased WB through LLE.  See details below.  Ended session with gait training x 25' x 2 reps in hallway with use of R handrail and ace wrap to provide DF assist and prevent L knee hyperextension.  Requires mod assist today with improved ability to advance LLE.  Provided cues for increased heel to toe foot contact during stance phase.  Provided manual facilitation for increased weight shift L for increased WB during gait.  Pt assisted back to room and left in w/c with quick release belt donned and all needs in reach.   Therapy Documentation Precautions:  Precautions Precautions: Fall Precaution Comments: Impulsive, L hemiplegia, possible left visual field deficit Restrictions Weight Bearing Restrictions: No   Pain:Pt with mild c/o pain in L knee, provided recurvatum wrap to prevent hyperextension.  Transfers Transfers: Yes Stand Pivot Transfers: 3: Mod assist Stand Pivot Transfer Details: Verbal cues for  sequencing;Verbal cues for technique;Verbal cues for precautions/safety;Verbal cues for gait pattern;Verbal cues for safe use of DME/AE;Manual facilitation for weight shifting;Manual facilitation for weight bearing Stand Pivot Transfer Details (indicate cue type and reason): Practices 4 reps of stand pivot transfer w/c <> mat table at mod assist level with cues for proper set up of w/c, managing leg rests, brakes, etc.  Continues to require assist for increased forward weight shift for improved buttock clearance and also for weight shifting to LLE to advance RLE properly.   Locomotion : Ambulation Ambulation/Gait Assistance: 3: Mod assist    Other Treatments: Treatments Therapeutic Activity: Performed standing NMR to LLE via weight shifts R and L and forwards/backwards stepping with RLE to faciliate increased weight shift and weight bearing through LLE.  Provided light assist at L knee to prevent buckle, however noted increased genu recurvatum, therefore applied recurvatum ace wrap to LLE to prevent increased knee extension.  Also performed standing reaching activity side to side and diagonally to faciliate weight shifting as well as trunk shortening/lengthening during activity.  Provided min assist to faciliate increased weight shift R and to maintain balance throughout.  Also performed several reps of sit <> stand with small block under RLE to faciliate increased WB through LLE.  Requires manual facilitation for increased forward weight shift, and tactile cues for increased quad and glute activation for upright posture.  Transitioned to also placing R over L hand on L knee to increase encouraged weight bearing through LLE when standing.   Neuromuscular Facilitation: Left;Lower Extremity;Forced use;Activity  to increase grading;Activity to increase sustained activation;Activity to increase lateral weight shifting  See FIM for current functional status  Therapy/Group: Individual Therapy  Vista Deck 03/05/2013, 10:27 AM

## 2013-03-05 NOTE — Progress Notes (Signed)
Orthopedic Tech Progress Note Patient Details:  Patricia Black [birth year] [id number]  Patient ID: [name], female   DOB: 10/18/1950, 62 y.o.   MRN: [id number]   [name] 03/05/2013, 3:18 PMCalled advanced for resting hand splint.

## 2013-03-05 NOTE — Progress Notes (Signed)
62 y.o. right handed female history of migraine headaches as well as diabetes mellitus (insulin pump) who was admitted 02/24/2013 with left-sided weakness,dizziness as well as fall on her back porch-- found by [facility] when family was unable to get in touch with her. 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 MRI /MRA brain showed acute infarcts in the right basal ganglia and posterior right MCA territory with associated edema and occluded R-MCA beyond origin. Echocardiogram with ejection fraction 70% grade 2 diastolic dysfunction. [name]. Neurology recommended ASA for embolic infarct due to occlusion of R-MCA artery and TEE ordered for workup. TEE revealed PFO and BLE dopplers were positive for L-mid peroneal vein DVT. On Xarelto  Subjective/Complaints: Slept well, no pain left shoulder No SOB or CP  Review of Systems - Negative except weakness Left arm   Objective: Vital Signs: Blood pressure 134/57, pulse 69, temperature 98 F (36.7 C), temperature source Oral, resp. rate 17, height 5' 6.93" (1.7 m), weight 117.3 kg (258 lb 9.6 oz), SpO2 97.00%. No results found. Results for orders placed during the hospital encounter of 03/03/13 (from the past 72 hour(s))  GLUCOSE, CAPILLARY     Status: Abnormal   Collection Time    03/03/13  8:32 PM      Result Value Range   Glucose-Capillary 285 (*) 70 - 99 mg/dL  CBC WITH DIFFERENTIAL     Status: Abnormal   Collection Time    03/04/13  5:00 AM      Result Value Range   WBC 7.4  4.0 - 10.5 K/uL   RBC 4.00  3.87 - 5.11 MIL/uL   Hemoglobin 11.4 (*) 12.0 - 15.0 g/dL   HCT 16.1 (*) 09.6 - 04.5 %   MCV 85.3  78.0 - 100.0 fL   MCH 28.5  26.0 - 34.0 pg   MCHC 33.4  30.0 - 36.0 g/dL   RDW 40.9  81.1 - 91.4 %   Platelets 206  150 - 400 K/uL   Neutrophils Relative % 69  43 - 77 %   Neutro Abs 5.1  1.7 - 7.7 K/uL   Lymphocytes Relative 21  12 - 46 %   Lymphs Abs 1.5  0.7 - 4.0  K/uL   Monocytes Relative 8  3 - 12 %   Monocytes Absolute 0.6  0.1 - 1.0 K/uL   Eosinophils Relative 2  0 - 5 %   Eosinophils Absolute 0.2  0.0 - 0.7 K/uL   Basophils Relative 0  0 - 1 %   Basophils Absolute 0.0  0.0 - 0.1 K/uL  COMPREHENSIVE METABOLIC PANEL     Status: Abnormal   Collection Time    03/04/13  5:00 AM      Result Value Range   Sodium 136  135 - 145 mEq/L   Potassium 4.1  3.5 - 5.1 mEq/L   Chloride 100  96 - 112 mEq/L   CO2 24  19 - 32 mEq/L   Glucose, Bld 314 (*) 70 - 99 mg/dL   BUN 12  6 - 23 mg/dL   Creatinine, Ser 7.82  0.50 - 1.10 mg/dL   Calcium 8.9  8.4 - 95.6 mg/dL   Total Protein 6.2  6.0 - 8.3 g/dL   Albumin 2.8 (*) 3.5 - 5.2 g/dL   AST 27  0 - 37 U/L   ALT 28  0 - 35 U/L   Alkaline Phosphatase 111  39 - 117 U/L   Total Bilirubin 0.5  0.3 - 1.2 mg/dL   GFR calc non Af Amer 88 (*) >90 mL/min   GFR calc Af Amer >90  >90 mL/min   Comment: (NOTE)     The eGFR has been calculated using the CKD EPI equation.     This calculation has not been validated in all clinical situations.     eGFR's persistently <90 mL/min signify possible Chronic Kidney     Disease.  GLUCOSE, CAPILLARY     Status: Abnormal   Collection Time    03/04/13  7:32 AM      Result Value Range   Glucose-Capillary 282 (*) 70 - 99 mg/dL   Comment 1 Notify RN    GLUCOSE, CAPILLARY     Status: Abnormal   Collection Time    03/04/13 11:28 AM      Result Value Range   Glucose-Capillary 336 (*) 70 - 99 mg/dL   Comment 1 Notify RN    GLUCOSE, CAPILLARY     Status: Abnormal   Collection Time    03/04/13  5:34 PM      Result Value Range   Glucose-Capillary 296 (*) 70 - 99 mg/dL   Comment 1 Notify RN    GLUCOSE, CAPILLARY     Status: Abnormal   Collection Time    03/04/13  8:39 PM      Result Value Range   Glucose-Capillary 307 (*) 70 - 99 mg/dL  GLUCOSE, CAPILLARY     Status: Abnormal   Collection Time    03/05/13  7:34 AM      Result Value Range   Glucose-Capillary 315 (*) 70 - 99  mg/dL     HEENT: normal Cardio: RRR and no Murmur Resp: CTA B/L and unlabored GI: BS positive and non tender Extremity:  Pulses positive and No Edema Skin:   Intact Neuro: Alert/Oriented, Cranial Nerve Abnormalities Left central 7, , Normal Sensory, Abnormal Motor 2-/5 Left bi, tri, trace grip, trace abd, Abnormal FMC Ataxic/ dec FMC and Dysarthric Musc/Skel:  Other no pain with L shoulder ROM Gen NAD   Assessment/Plan: 1. Functional deficits secondary to R MCA embolic infarct  through PFO which require 3+ hours per day of interdisciplinary therapy in a comprehensive inpatient rehab setting. Physiatrist is providing close team supervision and 24 hour management of active medical problems listed below. Physiatrist and rehab team continue to assess barriers to discharge/monitor patient progress toward functional and medical goals. FIM: FIM - Bathing Bathing Steps Patient Completed: Chest;Left Arm;Abdomen;Right upper leg;Left upper leg Bathing: 3: Mod-Patient completes 5-7 75f 10 parts or 50-74%  FIM - Upper Body Dressing/Undressing Upper body dressing/undressing steps patient completed: Thread/unthread right sleeve of pullover shirt/dresss Upper body dressing/undressing: 2: Max-Patient completed 25-49% of tasks FIM - Lower Body Dressing/Undressing Lower body dressing/undressing steps patient completed: Thread/unthread right pants leg;Thread/unthread right underwear leg Lower body dressing/undressing: 2: Max-Patient completed 25-49% of tasks        FIM - Press photographer Assistive Devices: Arm rests Bed/Chair Transfer: 4: Supine > Sit: Min A (steadying Pt. > 75%/lift 1 leg);4: Sit > Supine: Min A (steadying pt. > 75%/lift 1 leg);3: Bed > Chair or W/C: Mod A (lift or lower assist);3: Chair or W/C > Bed: Mod A (lift or lower assist)  FIM - Locomotion: Wheelchair Distance: Pt propeled standard light weight wheel chair 150 feet on level surfaces using BLEs and RUE,  with min A to negotiate turns and  obstacles.  Locomotion: Wheelchair: 4: Travels 150 ft or more: maneuvers on rugs and over door sillls with minimal assistance (Pt.>75%) FIM - Locomotion: Ambulation Locomotion: Ambulation Assistive Devices:  (x1 HHA, x1 R hand rail) Ambulation/Gait Assistance: 2: Max assist;4: Min assist Locomotion: Ambulation: 1: Two helpers (ambulated 25 feet with R hand rail, min A w/wheel chair follow)  Comprehension Comprehension Mode: Auditory Comprehension: 5-Understands complex 90% of the time/Cues < 10% of the time  Expression Expression Mode: Verbal Expression: 4-Expresses basic 75 - 89% of the time/requires cueing 10 - 24% of the time. Needs helper to occlude trach/needs to repeat words.  Social Interaction Social Interaction: 4-Interacts appropriately 75 - 89% of the time - Needs redirection for appropriate language or to initiate interaction.  Problem Solving Problem Solving: 5-Solves basic 90% of the time/requires cueing < 10% of the time  Memory Memory: 4-Recognizes or recalls 75 - 89% of the time/requires cueing 10 - 24% of the time  Medical Problem List and Plan:  1. DVT left mid peroneal vein/Anticoagulation: Pharmaceutical: Xarelto  2. Pain Management: prn tylenol effective for pain.  3. Mood: Has good outlook and aware of time needed for recovery. Will have LCSW follow for evaluation.  4. Neuropsych: This patient is capable of making decisions on her own behalf.  5. DM type 2--insulin dependent:uncontrolled On insulin pump at home--titrate levemir for basal insulin and use SSI as needed. Po intake is good.  6. Migraines: will resume Topamax and inderal at lower dose to avoid recurrence. Titrate as needed.  7. Oral pain: due to impact trial probe as well as sore throat from TEE. Continue lidocaine mouth washt--swish and spit. Pain is beginning to improve  8. Hyperactive airway: resume Advair   LOS (Days) 2 A FACE TO FACE EVALUATION WAS  PERFORMED  KIRSTEINS,ANDREW E 03/05/2013, 8:37 AM

## 2013-03-05 NOTE — Progress Notes (Signed)
Upon arrival patient on CPAP machine, tolerating well. RT will assist as needed.

## 2013-03-05 NOTE — Progress Notes (Signed)
Speech Language Pathology Daily Session Note  Patient Details  Name: Patricia Black MRN: [id number] Date of Birth: May 01, 1950  Today's Date: 03/05/2013 Time: 1530-1600 Time Calculation (min): 30 min  Short Term Goals: Week 1: SLP Short Term Goal 1 (Week 1): Patient will identify 2/3 effective speech intelligibilty strategies with Supervision level question cues SLP Short Term Goal 2 (Week 1): Patient will self-monitor and correct speech intelligibility errors with Min verbal cues SLP Short Term Goal 3 (Week 1): Patient will initiate, sequence and solve moderately complex problems with Min verbal cues  Skilled Therapeutic Interventions: Skilled treatment focused on speech and cognitive goals. SLP facilitated session with Supervision verbal cues for completion of oral motor exercises. Pt recalled 3 out of 3 speech intelligibility strategies with Mod I. She utilized strategies during a structured cognitive-linguistic task and in conversation with Min cues. Pt's speech appears to be primarily impacted by horaseness resulting in voice breaks, which pt and daughter report is improving since her flowers have been removed and she had been started on allergy medicine. Continue plan of care.   FIM:  Comprehension Comprehension Mode: Auditory Comprehension: 5-Understands complex 90% of the time/Cues < 10% of the time Expression Expression Mode: Verbal Expression: 4-Expresses basic 75 - 89% of the time/requires cueing 10 - 24% of the time. Needs helper to occlude trach/needs to repeat words. Social Interaction Social Interaction: 4-Interacts appropriately 75 - 89% of the time - Needs redirection for appropriate language or to initiate interaction. Problem Solving Problem Solving: 5-Solves basic 90% of the time/requires cueing < 10% of the time Memory Memory: 5-Recognizes or recalls 90% of the time/requires cueing < 10% of the time  Pain Pain Assessment Pain Assessment: No/denies  pain  Therapy/Group: Individual Therapy   Maxcine Ham, M.A. CCC-SLP [phone]   Maxcine Ham 03/05/2013, 4:24 PM

## 2013-03-05 NOTE — Progress Notes (Signed)
Social Work Assessment and Plan  Patient Details  Name: Patricia Black MRN: [id number] Date of Birth: [birth year]  Today's Date: 03/05/2013  Problem List:  Patient Active Problem List   Diagnosis Date Noted  . PFO (patent foramen ovale) 03/03/2013  . Peroneal DVT (deep venous thrombosis) 03/03/2013  . IMPACT 03/03/2013  . Type II or unspecified type diabetes mellitus without mention of complication, uncontrolled 03/03/2013  . OSA (obstructive sleep apnea) 03/03/2013  . Right shoulder pain 03/03/2013  . CVA (cerebral infarction) 02/24/2013  . CVA (cerebral vascular accident) 02/24/2013  . Hypothermia 02/24/2013  . Hypokalemia 02/24/2013   Past Medical History:  Past Medical History  Diagnosis Date  . Diabetes mellitus without complication   . Migraine   . Sleep apnea   . Mitral valve prolapse    Past Surgical History:  Past Surgical History  Procedure Laterality Date  . Abdominal hysterectomy    . Cholecystectomy    . Tee without cardioversion N/A 03/02/2013    Procedure: TRANSESOPHAGEAL ECHOCARDIOGRAM (TEE);  Surgeon: Lewayne Bunting, MD;  Location: [facility] ENDOSCOPY;  Service: Cardiovascular;  Laterality: N/A;   Social History:  reports that she has never smoked. She has never used smokeless tobacco. She reports that she does not drink alcohol or use illicit drugs.  Family / Support Systems Marital Status: Divorced Patient Roles: Parent;Other (Comment) (grandmother, aunt, employee) Children: Alfredo Batty - dtr  [phone]  son in [place], [place] Anticipated Caregiver: Dtr and son in law Ability/Limitations of Caregiver: Dtr works.  Dtr will get someone to stay with patient as needed. Caregiver Availability: Other (Comment) (Dtr is aware of need for 24/7 supervision and will begin working on this.) Family Dynamics: supportive family, but live in different areas  Social History Preferred language: English Religion: Unknown Education: graduated high school Read:  Yes Write: Yes Employment Status: Employed Name of Employer: [employer] Length of Employment: 4 Return to Work Plans: Pt would like to return to work when she is physically able to do so safely. Legal Hisotry/Current Legal Issues: None Guardian/Conservator: N/A   Abuse/Neglect Physical Abuse: Denies Verbal Abuse: Denies Sexual Abuse: Denies Exploitation of patient/patient's resources: Denies Self-Neglect: Denies  Emotional Status Pt's affect, behavior and adjustment status: Pt is grateful that she was not driving at the time of her stroke.  She is disappointed that she did not make it to her dtr's home for Thanksgiving.  Pt looks forward to being with family at Christmastime. Recent Psychosocial Issues: Pt and dtr are trying to figure out moving pt to dtr's home for her recovery and where they go after that. Pyschiatric History: None reported Substance Abuse History: None reported  Patient / Family Perceptions, Expectations & Goals Pt/Family understanding of illness & functional limitations: Pt and dtr feel they have a good understanding of pt's condition and limitations.  Dtr is just wondering what pt's needs will be longterm and how to best address those. Premorbid pt/family roles/activities: Pt worked 60+ hour weeks and likes to read, knit, quilt. Anticipated changes in roles/activities/participation: Pt knows she cannot work at this time, but would like to continue her hobbies. Pt/family expectations/goals: Pt would like to "walk and climb stairs" and get stronger and she plans to set bigger goals now that she has met some of those.  Community Resources [employer]: None Premorbid Home Care/DME Agencies: None Transportation available at discharge: dtr Resource referrals recommended: Neuropsychology;Support group (specify)  Discharge Planning Living Arrangements: Alone;Children (will go home with dtr) Support Systems:  Children;Other relatives Type of  Residence: Private residence Insurance Resources: Media planner (specify) Counselling psychologist) Financial Resources: Employment Financial Screen Referred: No Living Expenses: Psychologist, sport and exercise Management: Patient Does the patient have any problems obtaining your medications?: No Home Management: Pt was independent PTA. Patient/Family Preliminary Plans: Pt will go to dtr's home in [place] at d/c. Barriers to Discharge: Steps Social Work Anticipated Follow Up Needs: HH/OP;Support Group Expected length of stay: 12-16 days  Clinical Impression CSW met with pt and her dtr 03-04-13 to complete assessment and to introduce self/role to them.  Pt was leaving alone in a rental home and working in [place].  Her family is mostly in [place] and she was getting ready to drive to her dtr's home for Thanksgiving when she fell and it was determined she had a stroke.  Pt was disappointed about not making it to see her family for the holidays.  Pt understands that she will need someone with her 24/7 when she is discharged from rehab.  Pt and dtr have already discussed this and the plan will be for pt to go to dtr's home in [place] at d/c.  Pt is accepting of this and is unsure when she will be able to return to work.  She is pleased that she will be around family, especially at the holidays.  Pt reports having a stressful job and is upset that the stress may have caused her to have a stroke.  She acknowledges that she will need to make some lifestyle changes to make herself healthier and is motivated to do so.  CSW gave some FMLA and short term disability papers to the [facility] to complete for pt.  Pt's dtr has been in communication with pt's employer and they have been helpful and supportive.  CSW encouraged dtr to find a primary care MD for pt in dtr's town.  She will work on that.  Pt will most likely need follow up therapies at d/c, so CSW will begin to investigate services in dtr's area.  CSW will continue to follow and assist  as needed.  Jalina Blowers, Vista Deck 03/05/2013, 10:15 AM

## 2013-03-05 NOTE — Progress Notes (Signed)
Physical Therapy Session Note  Patient Details  Name: Patricia Black MRN: [id number] Date of Birth: 01/24/1951  Today's Date: 03/05/2013 Time: 0865-7846 Time Calculation (min): 30 min  Short Term Goals: Week 1:  PT Short Term Goal 1 (Week 1): Pt will be supervision with Transfers (bed<>chair) PT Short Term Goal 2 (Week 1): Pt will be able to ambulate 100 feet using LRAD at min A and min cues.  PT Short Term Goal 3 (Week 1): Pt will be able to negotiate up/down 8 steps using a right railing with min A and min cues.   Skilled Therapeutic Interventions/Progress Updates:   Pt received lying on mat table in therapy gym, having just finished OT session.  Focus of session was introduction to hemi walker with gait training.  See full details below.  Performed stand pivot mat > w/c and w/c > recliner at mod assist with improved weight shifting noted in order to advance LEs to chair.  Continues to require most assist for forward weight shift in order to stand.  Pt left in recliner with quick release belt donned and all needs in reach.    Therapy Documentation Precautions:  Precautions Precautions: Fall Precaution Comments: Impulsive, L hemiplegia, possible left visual field deficit Restrictions Weight Bearing Restrictions: No   Vital Signs: Therapy Vitals Temp: 98 F (36.7 C) Temp src: Oral Pulse Rate: 64 Resp: 18 BP: 124/47 mmHg Patient Position, if appropriate: Sitting Oxygen Therapy SpO2: 98 % O2 Device: None (Room air) Pain: Pain Assessment Pain Assessment: No/denies pain   Locomotion : Ambulation Ambulation: Yes Ambulation/Gait Assistance: 3: Mod assist Ambulation Distance (Feet): 50 Feet (x2) Assistive device: Hemi-walker Ambulation/Gait Assistance Details: Verbal cues for sequencing;Verbal cues for precautions/safety;Verbal cues for technique;Verbal cues for gait pattern;Manual facilitation for weight shifting;Verbal cues for safe use of DME/AE;Manual facilitation for  weight bearing Ambulation/Gait Assistance Details: Initiated gait training with hemi walker and use of ace wrap on LLE to prevent knee hyperextension and also to provide DF assist.  Provided demonstration cues initially for correct sequencing/technique with hemi walker and also continued to provide verbal cues throughout.  Provided manual faciliation for increased weight shift L for improved WB through LLE during R swing phase and also increased weight shift R for improved clearance of LLE.  Also provided min tactile cues and verbal cues for upright posture.  did well for first attempt, however will need increased practice.   Gait Gait: Yes Gait Pattern: Impaired Gait Pattern: Decreased step length - left;Decreased dorsiflexion - left;Decreased weight shift to right;Decreased stance time - left;Step-to pattern;Decreased weight shift to left;Trunk flexed;Narrow base of support   See FIM for current functional status  Therapy/Group: Individual Therapy  Vista Deck 03/05/2013, 4:13 PM

## 2013-03-05 NOTE — Progress Notes (Signed)
Patient placed home cpap on during the night and tolerated cpap well.

## 2013-03-05 NOTE — IPOC Note (Deleted)
Overall Plan of Care [facility]) Patient Details Name: Patricia Black MRN: [id number] DOB: Jun 10, 1950  Admitting Diagnosis: RT CVA  Hospital Problems: Active Problems:   CVA (cerebral infarction)     Functional Problem List: Nursing Pain;Medication Management;Safety;Skin Integrity  PT Balance;Edema;Endurance;Motor;Perception;Safety  OT Balance;Cognition;Motor;Perception;Safety;Sensory;Vision  SLP Cognition;Linguistic  TR         Basic ADL's: OT Eating;Grooming;Bathing;Dressing;Toileting     Advanced  ADL's: OT Simple Meal Preparation     Transfers: PT Bed Mobility;Bed to Chair;Car;Furniture  OT Toilet;Tub/Shower     Locomotion: PT Ambulation;Stairs     Additional Impairments: OT Fuctional Use of Upper Extremity  SLP Communication;Social Cognition expression Problem Solving;Awareness;Memory  TR      Anticipated Outcomes Item Anticipated Outcome  Self Feeding modified independent  Swallowing      Basic self-care  supervision  Toileting  supervision   Bathroom Transfers supervision  Bowel/Bladder  Remain continent of bowel and bladder  Transfers  mod I  Locomotion  supervision  Communication  Supervision   Cognition  Supervision  Pain  Pain manage at or below 3  Safety/Judgment  Min assist   Therapy Plan: PT Intensity: Minimum of 1-2 x/day ,45 to 90 minutes PT Frequency: 5 out of 7 days PT Duration Estimated Length of Stay: 12-14 days OT Intensity: Minimum of 1-2 x/day, 45 to 90 minutes OT Duration/Estimated Length of Stay: 14-16 days SLP Intensity: Minumum of 1-2 x/day, 30 to 90 minutes SLP Frequency: 5 out of 7 days SLP Duration/Estimated Length of Stay: 12-14       Team Interventions: Nursing Interventions Patient/Family Education;Medication Management;Skin Care/Wound Management;Pain Management  PT interventions Ambulation/gait training;Balance/vestibular training;Community reintegration;Discharge planning;Disease  management/prevention;DME/adaptive equipment instruction;Functional mobility training;Neuromuscular re-education;Patient/family education;Pain management;Psychosocial support;Splinting/orthotics;Stair training;Therapeutic Exercise;Therapeutic Activities;UE/LE Strength taining/ROM;UE/LE Coordination activities  OT Interventions Balance/vestibular training;Cognitive remediation/compensation;Community reintegration;Discharge planning;Functional mobility training;Functional electrical stimulation;DME/adaptive equipment instruction;Neuromuscular re-education;Disease mangement/prevention;Pain management;Patient/family education;Therapeutic Activities;Splinting/orthotics;UE/LE Strength taining/ROM;Therapeutic Exercise;Self Care/advanced ADL retraining;UE/LE Coordination activities  SLP Interventions Cognitive remediation/compensation;Cueing hierarchy;Environmental controls;Functional tasks;Internal/external aids;Medication managment;Oral motor exercises;Patient/family education;Speech/Language facilitation;Therapeutic Activities  TR Interventions    SW/CM Interventions Discharge Planning;Psychosocial Support;Patient/Family Education    Team Discharge Planning: Destination: PT-Home (to pt's dtr's home in [place]) ,OT- Home (to her daughters house in [place]) , [place]- (dtr's house) Projected Follow-up: PT-Home health PT, OT-  Home health OT, SLP-Outpatient SLP Projected Equipment Needs: PT-Rolling walker with 5" wheels (TBD), OT- 3 in 1 bedside comode;Tub/shower bench, SLP-None recommended by SLP Equipment Details: PT- , OT-  Patient/family involved in discharge planning: PT- Patient;Family member/caregiver,  OT-Patient;Family member/caregiver, SLP-Patient;Family member/caregiver  MD ELOS: 14 days Medical Rehab Prognosis:  Excellent Assessment: The patient has been admitted for CIR therapies. The team will be addressing, functional mobility, strength, stamina, balance, safety, adaptive techniques/equipment,  self-care, bowel and bladder mgt, patient and caregiver education, visual perceptual and visual spatial awareness, cognition, communication, . Goals have been set at supervision to mod I for mobility, ADL's, cognition, and min assist for safety.    Ranelle Oyster, MD, FAAPMR      See Team Conference Notes for weekly updates to the plan of care

## 2013-03-05 NOTE — Progress Notes (Signed)
Occupational Therapy Session Note  Patient Details  Name: Patricia Black MRN: [id number] Date of Birth: 1950/07/08  Today's Date: 03/05/2013 Time: 6213-0865 Time Calculation (min): 55 min  Short Term Goals: Week 1:  OT Short Term Goal 1 (Week 1): Pt will perform UB dressing with supervision and mod instructional cueing. OT Short Term Goal 2 (Week 1): Pt will donn pullover underpants and pants with min assist sit to stand. OT Short Term Goal 3 (Week 1): Pt will perform toilet transfers with min assist to elevated toilet/3:1. OT Short Term Goal 4 (Week 1): Pt will use the LUE as a stabilizer with min assist during bathing tasks. OT Short Term Goal 5 (Week 1): Pt will initiate and perform all bathing with no more than min instructional cueing during session.  Skilled Therapeutic Interventions/Progress Updates:  Patient resting in w/c upon arrival with her daughter at her side.  Once enter the shower, daughter stepped out.  Engaged in self care retraining to include shower, w/c><shower transfer, dressing and grooming.  Focused session on hemi techniques, forced use of LUE, activity tolerance, sit><stands, and standing balance.  Issued LH sponge and bath mitt to improve independence with bath.  Patient resting in w/c with items within reach, QRB in place and daughter present.  Therapy Documentation Precautions:  Precautions Precautions: Fall Precaution Comments: Impulsive, L hemiplegia, possible left visual field deficit Restrictions Weight Bearing Restrictions: [name]: Denies pain ADL: See FIM for current functional status  Therapy/Group: Individual Therapy  Bud Kaeser 03/05/2013, 12:46 PM

## 2013-03-06 ENCOUNTER — Inpatient Hospital Stay (HOSPITAL_COMMUNITY): Payer: Managed Care, Other (non HMO) | Admitting: Physical Therapy

## 2013-03-06 ENCOUNTER — Inpatient Hospital Stay (HOSPITAL_COMMUNITY): Payer: Managed Care, Other (non HMO) | Admitting: *Deleted

## 2013-03-06 ENCOUNTER — Inpatient Hospital Stay (HOSPITAL_COMMUNITY): Payer: Self-pay | Admitting: Physical Therapy

## 2013-03-06 ENCOUNTER — Inpatient Hospital Stay (HOSPITAL_COMMUNITY): Payer: Managed Care, Other (non HMO)

## 2013-03-06 DIAGNOSIS — E1165 Type 2 diabetes mellitus with hyperglycemia: Secondary | ICD-10-CM

## 2013-03-06 DIAGNOSIS — I6330 Cerebral infarction due to thrombosis of unspecified cerebral artery: Secondary | ICD-10-CM

## 2013-03-06 DIAGNOSIS — G8110 Spastic hemiplegia affecting unspecified side: Secondary | ICD-10-CM

## 2013-03-06 LAB — GLUCOSE, CAPILLARY
Glucose-Capillary: 297 mg/dL — ABNORMAL HIGH (ref 70–99)
Glucose-Capillary: 344 mg/dL — ABNORMAL HIGH (ref 70–99)

## 2013-03-06 MED ORDER — INSULIN DETEMIR 100 UNIT/ML ~~LOC~~ SOLN
30.0000 [IU] | Freq: Every day | SUBCUTANEOUS | Status: DC
  Filled 2013-03-06: qty 0.3

## 2013-03-06 MED ORDER — INSULIN DETEMIR 100 UNIT/ML ~~LOC~~ SOLN
15.0000 [IU] | Freq: Every day | SUBCUTANEOUS | Status: DC
  Administered 2013-03-06: 15 [IU] via SUBCUTANEOUS
  Filled 2013-03-06 (×2): qty 0.15

## 2013-03-06 NOTE — Progress Notes (Signed)
Speech Language Pathology Daily Session Note  Patient Details  Name: Patricia Black MRN: [id number] Date of Birth: October 13, 1950  Today's Date: 03/06/2013 Time: 1015-1100 Time Calculation (min): 45 min  Short Term Goals: Week 1: SLP Short Term Goal 1 (Week 1): Patient will identify 2/3 effective speech intelligibilty strategies with Supervision level question cues SLP Short Term Goal 2 (Week 1): Patient will self-monitor and correct speech intelligibility errors with Min verbal cues SLP Short Term Goal 3 (Week 1): Patient will initiate, sequence and solve moderately complex problems with Min verbal cues  Skilled Therapeutic Interventions: Treatment focused on speech and cognitive goals. SLP facilitated session with supervision question cues for recall of oral motor exercises, which pt then adequately performed with supervision. She reported that she had been practicing earlier this morning. Pt recalled 3 out of 3 speech intelligibility strategies with Mod I and utilized them during structured cognitive-linguistic task and in conversation with supervision level verbal cues. Of note, pt's intelligibility was improved today with improvement noted in vocal quality. Pt's voice remains mild-moderately hoarse, however today presents with mildly increased vocal intensity and without voice breaks. Continue plan of care.   FIM:  Comprehension Comprehension Mode: Auditory Comprehension: 5-Understands complex 90% of the time/Cues < 10% of the time Expression Expression Mode: Verbal Expression: 5-Expresses basic 90% of the time/requires cueing < 10% of the time. Social Interaction Social Interaction: 6-Interacts appropriately with others with medication or extra time (anti-anxiety, antidepressant). Problem Solving Problem Solving: 5-Solves basic problems: With no assist Memory Memory: 5-Recognizes or recalls 90% of the time/requires cueing < 10% of the time FIM - Eating Eating Activity: 7: Complete  independence:no helper  Pain Pain Assessment Pain Assessment: No/denies pain  Therapy/Group: Individual Therapy   Maxcine Ham, M.A. CCC-SLP [phone]   Maxcine Ham 03/06/2013, 11:24 AM

## 2013-03-06 NOTE — Progress Notes (Signed)
Occupational Therapy Note   Patient Details  Name: Patricia Black MRN: [id number] Date of Birth: 1950-10-03 Today's Date: 03/06/2013 Time: 0900-1000  (60 min) Pain: none Individual session   Engaged in self care retraining to include shower, w/c><shower transfer, dressing and grooming. Focused session on hemi techniques, forced use of LUE, activity tolerance, sit><stands, and standing balance.Used bath mitt to improve independence with bath and forced use LUE. Used LUE with max assist for Right arm and stomach.  She demonstrated  Elbow flexion and extension with GE positions.  Stood for bathing peri area with mod assist for 1 minute.  Transferred from wc to bench and back with mod assist.  Needed assist with pants and socks and alble to thread RUE in shirt and assist with LUE.  Left in recliner with safety belt on and call bell in reach.      Humberto Seals 03/06/2013, 9:22 AM

## 2013-03-06 NOTE — Progress Notes (Signed)
Patient is wearing home cpap at this time.

## 2013-03-06 NOTE — Progress Notes (Signed)
Physical Therapy Session Note  Patient Details  Name: Patricia Black MRN: [id number] Date of Birth: [birth year]  Today's Date: 03/06/2013 Time: 0800-0900 and 1630-1700 Time Calculation (min): 60 min and 30 min  Short Term Goals: Week 1:  PT Short Term Goal 1 (Week 1): Pt will be supervision with Transfers (bed<>chair) PT Short Term Goal 2 (Week 1): Pt will be able to ambulate 100 feet using LRAD at min A and min cues.  PT Short Term Goal 3 (Week 1): Pt will be able to negotiate up/down 8 steps using a right railing with min A and min cues.   Skilled Therapeutic Interventions/Progress Updates:    Treatment Session 1: Pt received semi-reclined in bed; agreeable to therapy. Session focused on increasing pt independence with gait, stair negotiation, and functional transfers. Self-propulsion of w/c x150' using RUE and bilat LE's with supervision. Gait x94' with hemi-walker, min A for stability/balance, and verbal cueing for upright posture. Negotiation of 4 stairs with R hand rail; step-to pattern; required min A to ascend, mod A to descend; mod verbal cueing for sequencing. Therapist transported pt to room in w/c secondary to pt fatigue. Once in room, performed stand-pivot from w/cbed side chair with mod A. Therapist departed with pt seated in bedside chair with quick release belt in place for safety, RN present, and all needs within reach.   Treatment Session 2: Pt received seated in w/c with daughter present. Pt agreeable to therapy. Session focused on stair negotiation, per request of patient. Pt performed 2 stair negotiation trials R hand rail and step-to pattern. Initial trial x8 stairs with min A and min verbal cues for proper sequencing. Second trial x4 stairs with min guard; effective carryover of proper sequencing without cues. Therapist transported pt in to room in w/c secondary to pt fatigue. Stand-pivot transfer from w/c>bedside chair with mod A. Therapist departed with pt seated in bedside  chair with quick release belt in place for safety, daughter present, and all needs within reach.   Therapy Documentation Precautions:  Precautions Precautions: Fall Precaution Comments: Impulsive, L hemiplegia, possible left visual field deficit Restrictions Weight Bearing Restrictions: [name]:  Pt reports no pain during AM/PM sessions. Ambulation Ambulation/Gait Assistance: 4: Min assist   See FIM for current functional status  Therapy/Group: Individual Therapy  Darriel Utter, Lorenda Ishihara 03/06/2013, 2:15 PM

## 2013-03-06 NOTE — Progress Notes (Signed)
62 y.o. right handed female history of migraine headaches as well as diabetes mellitus (insulin pump) who was admitted 02/24/2013 with left-sided weakness,dizziness as well as fall on her back porch-- found by [facility] when family was unable to get in touch with her. 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 MRI /MRA brain showed acute infarcts in the right basal ganglia and posterior right MCA territory with associated edema and occluded R-MCA beyond origin. Echocardiogram with ejection fraction 70% grade 2 diastolic dysfunction. [name]. Neurology recommended ASA for embolic infarct due to occlusion of R-MCA artery and TEE ordered for workup. TEE revealed PFO and BLE dopplers were positive for L-mid peroneal vein DVT. On Xarelto  Subjective/Complaints: Notes dry cough last night which has since resolved but left her with a bit of a headache. Review of Systems - Negative except weakness Left arm   Objective: Vital Signs: Blood pressure 140/46, pulse 66, temperature 98 F (36.7 C), temperature source Oral, resp. rate 17, height 5' 6.93" (1.7 m), weight 117.3 kg (258 lb 9.6 oz), SpO2 97.00%. No results found. Results for orders placed during the hospital encounter of 03/03/13 (from the past 72 hour(s))  GLUCOSE, CAPILLARY     Status: Abnormal   Collection Time    03/03/13  8:32 PM      Result Value Range   Glucose-Capillary 285 (*) 70 - 99 mg/dL  CBC WITH DIFFERENTIAL     Status: Abnormal   Collection Time    03/04/13  5:00 AM      Result Value Range   WBC 7.4  4.0 - 10.5 K/uL   RBC 4.00  3.87 - 5.11 MIL/uL   Hemoglobin 11.4 (*) 12.0 - 15.0 g/dL   HCT 91.4 (*) 78.2 - 95.6 %   MCV 85.3  78.0 - 100.0 fL   MCH 28.5  26.0 - 34.0 pg   MCHC 33.4  30.0 - 36.0 g/dL   RDW 21.3  08.6 - 57.8 %   Platelets 206  150 - 400 K/uL   Neutrophils Relative % 69  43 - 77 %   Neutro Abs 5.1  1.7 - 7.7 K/uL   Lymphocytes Relative 21   12 - 46 %   Lymphs Abs 1.5  0.7 - 4.0 K/uL   Monocytes Relative 8  3 - 12 %   Monocytes Absolute 0.6  0.1 - 1.0 K/uL   Eosinophils Relative 2  0 - 5 %   Eosinophils Absolute 0.2  0.0 - 0.7 K/uL   Basophils Relative 0  0 - 1 %   Basophils Absolute 0.0  0.0 - 0.1 K/uL  COMPREHENSIVE METABOLIC PANEL     Status: Abnormal   Collection Time    03/04/13  5:00 AM      Result Value Range   Sodium 136  135 - 145 mEq/L   Potassium 4.1  3.5 - 5.1 mEq/L   Chloride 100  96 - 112 mEq/L   CO2 24  19 - 32 mEq/L   Glucose, Bld 314 (*) 70 - 99 mg/dL   BUN 12  6 - 23 mg/dL   Creatinine, Ser 4.69  0.50 - 1.10 mg/dL   Calcium 8.9  8.4 - 62.9 mg/dL   Total Protein 6.2  6.0 - 8.3 g/dL   Albumin 2.8 (*) 3.5 - 5.2 g/dL   AST 27  0 - 37 U/L   ALT 28  0 - 35  U/L   Alkaline Phosphatase 111  39 - 117 U/L   Total Bilirubin 0.5  0.3 - 1.2 mg/dL   GFR calc non Af Amer 88 (*) >90 mL/min   GFR calc Af Amer >90  >90 mL/min   Comment: (NOTE)     The eGFR has been calculated using the CKD EPI equation.     This calculation has not been validated in all clinical situations.     eGFR's persistently <90 mL/min signify possible Chronic Kidney     Disease.  GLUCOSE, CAPILLARY     Status: Abnormal   Collection Time    03/04/13  7:32 AM      Result Value Range   Glucose-Capillary 282 (*) 70 - 99 mg/dL   Comment 1 Notify RN    GLUCOSE, CAPILLARY     Status: Abnormal   Collection Time    03/04/13 11:28 AM      Result Value Range   Glucose-Capillary 336 (*) 70 - 99 mg/dL   Comment 1 Notify RN    GLUCOSE, CAPILLARY     Status: Abnormal   Collection Time    03/04/13  5:34 PM      Result Value Range   Glucose-Capillary 296 (*) 70 - 99 mg/dL   Comment 1 Notify RN    GLUCOSE, CAPILLARY     Status: Abnormal   Collection Time    03/04/13  8:39 PM      Result Value Range   Glucose-Capillary 307 (*) 70 - 99 mg/dL  GLUCOSE, CAPILLARY     Status: Abnormal   Collection Time    03/05/13  7:34 AM      Result Value  Range   Glucose-Capillary 315 (*) 70 - 99 mg/dL  GLUCOSE, CAPILLARY     Status: Abnormal   Collection Time    03/05/13 11:27 AM      Result Value Range   Glucose-Capillary 311 (*) 70 - 99 mg/dL  GLUCOSE, CAPILLARY     Status: Abnormal   Collection Time    03/05/13  5:10 PM      Result Value Range   Glucose-Capillary 258 (*) 70 - 99 mg/dL  GLUCOSE, CAPILLARY     Status: Abnormal   Collection Time    03/05/13  8:44 PM      Result Value Range   Glucose-Capillary 343 (*) 70 - 99 mg/dL   Comment 1 Notify RN    GLUCOSE, CAPILLARY     Status: Abnormal   Collection Time    03/06/13  7:26 AM      Result Value Range   Glucose-Capillary 297 (*) 70 - 99 mg/dL     HEENT: normal Cardio: RRR and no Murmur Resp: CTA B/L and unlabored. No wheezes or rhonchi GI: BS positive and non tender Extremity:  Pulses positive and No Edema Skin:   Intact Neuro: Alert/Oriented, Cranial Nerve Abnormalities Left central 7, , Normal Sensory, Abnormal Motor 2-/5 Left bi, tri, trace grip, trace abd, Abnormal FMC Ataxic/ dec FMC and Dysarthric Musc/Skel:  Other no pain with L shoulder ROM Gen NAD, alert, pleasant   Assessment/Plan: 1. Functional deficits secondary to R MCA embolic infarct  through PFO which require 3+ hours per day of interdisciplinary therapy in a comprehensive inpatient rehab setting. Physiatrist is providing close team supervision and 24 hour management of active medical problems listed below. Physiatrist and rehab team continue to assess barriers to discharge/monitor patient progress toward functional and medical goals. FIM: FIM - Bathing  Bathing Steps Patient Completed: Chest;Left Arm;Abdomen;Right upper leg;Left upper leg Bathing: 3: Mod-Patient completes 5-7 23f 10 parts or 50-74%  FIM - Upper Body Dressing/Undressing Upper body dressing/undressing steps patient completed: Thread/unthread right sleeve of pullover shirt/dresss;Put head through opening of pull over shirt/dress Upper  body dressing/undressing: 3: Mod-Patient completed 50-74% of tasks FIM - Lower Body Dressing/Undressing Lower body dressing/undressing steps patient completed: Thread/unthread right pants leg;Thread/unthread right underwear leg;Thread/unthread left underwear leg Lower body dressing/undressing: 2: Max-Patient completed 25-49% of tasks        FIM - Banker Devices: Arm rests;Bed rails Bed/Chair Transfer: 3: Bed > Chair or W/C: Mod A (lift or lower assist);3: Chair or W/C > Bed: Mod A (lift or lower assist) (chair to chair)  FIM - Locomotion: Wheelchair Distance: Pt propeled standard light weight wheel chair 150 feet on level surfaces using BLEs and RUE, with min A to negotiate turns and obstacles.  Locomotion: Wheelchair: 4: Travels 150 ft or more: maneuvers on rugs and over door sillls with minimal assistance (Pt.>75%) FIM - Locomotion: Ambulation Locomotion: Ambulation Assistive Devices: Walker - Hemi Ambulation/Gait Assistance: 3: Mod assist Locomotion: Ambulation: 1: Travels less than 50 ft with moderate assistance (Pt: 50 - 74%)  Comprehension Comprehension Mode: Auditory Comprehension: 5-Understands complex 90% of the time/Cues < 10% of the time  Expression Expression Mode: Verbal Expression: 5-Expresses basic 90% of the time/requires cueing < 10% of the time.  Social Interaction Social Interaction: 5-Interacts appropriately 90% of the time - Needs monitoring or encouragement for participation or interaction.  Problem Solving Problem Solving: 5-Solves basic problems: With no assist  Memory Memory: 5-Recognizes or recalls 90% of the time/requires cueing < 10% of the time  Medical Problem List and Plan:  1. DVT left mid peroneal vein/Anticoagulation: Pharmaceutical: Xarelto  2. Pain Management: prn tylenol effective for pain.  3. Mood: Has good outlook and aware of time needed for recovery.    4. Neuropsych: This patient is capable  of making decisions on her own behalf.  5. DM type 2--insulin dependent:uncontrolled On insulin pump at home--titrate levemir further for basal insulin and use SSI as needed. Po intake is good.  6. Migraines: resumed Topamax and inderal at lower dose to avoid recurrence. Titrate as needed. --controlled for the most part- today's headache because of cough---follow for now. 7. Oral pain: due to impact trial probe as well as sore throat from TEE. Continue lidocaine mouth wash--swish and spit. Pain is beginning to improve  8. Hyperactive airway: resumed Advair   LOS (Days) 3 A FACE TO FACE EVALUATION WAS PERFORMED  Patricia Black T 03/06/2013, 9:10 AM

## 2013-03-07 LAB — GLUCOSE, CAPILLARY
Glucose-Capillary: 339 mg/dL — ABNORMAL HIGH (ref 70–99)
Glucose-Capillary: 321 mg/dL — ABNORMAL HIGH (ref 70–99)
Glucose-Capillary: 288 mg/dL — ABNORMAL HIGH (ref 70–99)

## 2013-03-07 MED ORDER — INSULIN DETEMIR 100 UNIT/ML ~~LOC~~ SOLN
35.0000 [IU] | Freq: Every day | SUBCUTANEOUS | Status: DC
  Administered 2013-03-07 – 2013-03-17 (×11): 35 [IU] via SUBCUTANEOUS
  Filled 2013-03-07 (×12): qty 0.35

## 2013-03-07 MED ORDER — INSULIN DETEMIR 100 UNIT/ML ~~LOC~~ SOLN
20.0000 [IU] | Freq: Every day | SUBCUTANEOUS | Status: DC
  Administered 2013-03-07 – 2013-03-08 (×2): 20 [IU] via SUBCUTANEOUS
  Filled 2013-03-07 (×3): qty 0.2

## 2013-03-07 NOTE — Progress Notes (Signed)
62 y.o. right handed female history of migraine headaches as well as diabetes mellitus (insulin pump) who was admitted 02/24/2013 with left-sided weakness,dizziness as well as fall on her back porch-- found by [facility] when family was unable to get in touch with her. 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 MRI /MRA brain showed acute infarcts in the right basal ganglia and posterior right MCA territory with associated edema and occluded R-MCA beyond origin. Echocardiogram with ejection fraction 70% grade 2 diastolic dysfunction. [name]. Neurology recommended ASA for embolic infarct due to occlusion of R-MCA artery and TEE ordered for workup. TEE revealed PFO and BLE dopplers were positive for L-mid peroneal vein DVT. On Xarelto  Subjective/Complaints: Coughed still yesterday. Most of flowers removed from room. Thinks it's better today. Headache better Review of Systems - Negative except weakness Left arm   Objective: Vital Signs: Blood pressure 128/72, pulse 63, temperature 98.1 F (36.7 C), temperature source Oral, resp. rate 18, height 5' 6.93" (1.7 m), weight 117.3 kg (258 lb 9.6 oz), SpO2 95.00%. No results found. Results for orders placed during the hospital encounter of 03/03/13 (from the past 72 hour(s))  GLUCOSE, CAPILLARY     Status: Abnormal   Collection Time    03/04/13 11:28 AM      Result Value Range   Glucose-Capillary 336 (*) 70 - 99 mg/dL   Comment 1 Notify RN    GLUCOSE, CAPILLARY     Status: Abnormal   Collection Time    03/04/13  5:34 PM      Result Value Range   Glucose-Capillary 296 (*) 70 - 99 mg/dL   Comment 1 Notify RN    GLUCOSE, CAPILLARY     Status: Abnormal   Collection Time    03/04/13  8:39 PM      Result Value Range   Glucose-Capillary 307 (*) 70 - 99 mg/dL  GLUCOSE, CAPILLARY     Status: Abnormal   Collection Time    03/05/13  7:34 AM      Result Value Range   Glucose-Capillary 315 (*) 70 - 99 mg/dL  GLUCOSE, CAPILLARY     Status: Abnormal   Collection Time    03/05/13 11:27 AM      Result Value Range   Glucose-Capillary 311 (*) 70 - 99 mg/dL  GLUCOSE, CAPILLARY     Status: Abnormal   Collection Time    03/05/13  5:10 PM      Result Value Range   Glucose-Capillary 258 (*) 70 - 99 mg/dL  GLUCOSE, CAPILLARY     Status: Abnormal   Collection Time    03/05/13  8:44 PM      Result Value Range   Glucose-Capillary 343 (*) 70 - 99 mg/dL   Comment 1 Notify RN    GLUCOSE, CAPILLARY     Status: Abnormal   Collection Time    03/06/13  7:26 AM      Result Value Range   Glucose-Capillary 297 (*) 70 - 99 mg/dL  GLUCOSE, CAPILLARY     Status: Abnormal   Collection Time    03/06/13 11:58 AM      Result Value Range   Glucose-Capillary 344 (*) 70 - 99 mg/dL  GLUCOSE, CAPILLARY     Status: Abnormal   Collection Time    03/06/13  5:16 PM      Result Value Range   Glucose-Capillary 285 (*) 70 - 99 mg/dL  GLUCOSE, CAPILLARY     Status: Abnormal   Collection Time    03/06/13  8:28 PM      Result Value Range   Glucose-Capillary 339 (*) 70 - 99 mg/dL   Comment 1 Notify RN    GLUCOSE, CAPILLARY     Status: Abnormal   Collection Time    03/07/13  7:26 AM      Result Value Range   Glucose-Capillary 317 (*) 70 - 99 mg/dL   Comment 1 Notify RN       HEENT: normal. No congestion, drainage Cardio: RRR and no Murmur Resp: CTA B/L and unlabored. No wheezes or rhonchi. Chest clear GI: BS positive and non tender Extremity:  Pulses positive and No Edema Skin:   Intact Neuro: Alert/Oriented, Cranial Nerve Abnormalities Left central 7, , Normal Sensory, Abnormal Motor 2-/5 Left bi, tri, trace grip, trace abd, Abnormal FMC Ataxic/ dec FMC and Dysarthric Musc/Skel:  Other no pain with L shoulder ROM Gen NAD, alert, pleasant   Assessment/Plan: 1. Functional deficits secondary to R MCA embolic infarct  through PFO which require 3+ hours per day of  interdisciplinary therapy in a comprehensive inpatient rehab setting. Physiatrist is providing close team supervision and 24 hour management of active medical problems listed below. Physiatrist and rehab team continue to assess barriers to discharge/monitor patient progress toward functional and medical goals. FIM: FIM - Bathing Bathing Steps Patient Completed: Chest;Left Arm;Abdomen;Right upper leg;Left upper leg;Front perineal area;Buttocks Bathing: 3: Mod-Patient completes 5-7 52f 10 parts or 50-74%  FIM - Upper Body Dressing/Undressing Upper body dressing/undressing steps patient completed: Thread/unthread right sleeve of pullover shirt/dresss;Put head through opening of pull over shirt/dress Upper body dressing/undressing: 3: Mod-Patient completed 50-74% of tasks FIM - Lower Body Dressing/Undressing Lower body dressing/undressing steps patient completed: Thread/unthread right pants leg;Thread/unthread right underwear leg;Thread/unthread left underwear leg Lower body dressing/undressing: 2: Max-Patient completed 25-49% of tasks        FIM - Banker Devices: Arm rests Bed/Chair Transfer: 3: Bed > Chair or W/C: Mod A (lift or lower assist);3: Chair or W/C > Bed: Mod A (lift or lower assist)  FIM - Locomotion: Wheelchair Distance: Pt propeled standard light weight wheel chair 150 feet on level surfaces using BLEs and RUE, with min A to negotiate turns and obstacles.  Locomotion: Wheelchair: 5: Travels 150 ft or more: maneuvers on rugs and over door sills with supervision, cueing or coaxing FIM - Locomotion: Ambulation Locomotion: Ambulation Assistive Devices: Chief Operating Officer Ambulation/Gait Assistance: 4: Min assist Locomotion: Ambulation: 2: Travels 50 - 149 ft with minimal assistance (Pt.>75%)  Comprehension Comprehension Mode: Auditory Comprehension: 5-Understands complex 90% of the time/Cues < 10% of the time  Expression Expression Mode:  Verbal Expression: 5-Expresses basic 90% of the time/requires cueing < 10% of the time.  Social Interaction Social Interaction: 5-Interacts appropriately 90% of the time - Needs monitoring or encouragement for participation or interaction.  Problem Solving Problem Solving: 5-Solves basic problems: With no assist  Memory Memory: 5-Recognizes or recalls 90% of the time/requires cueing < 10% of the time  Medical Problem List and Plan:  1. DVT left mid peroneal vein/Anticoagulation: Pharmaceutical: Xarelto  2. Pain Management: prn tylenol effective for pain.  3. Mood: Has good outlook and aware of time needed for recovery.    4. Neuropsych: This patient is capable of making decisions on her own behalf.  5. DM type 2--insulin dependent:uncontrolled On insulin pump at home--titrate levemir further today for basal insulin  and use SSI as needed. Po intake is good.  6. Migraines: resumed Topamax and inderal at lower dose to avoid recurrence. Titrate as needed. --controlled for the most part- headaches recently appear related to coughing from reaction to flowers in room-  -continue to assess 7. Oral pain: due to impact trial probe as well as sore throat from TEE. Continue lidocaine mouth wash--swish and spit. Pain is beginning to improve  8. Hyperactive airway: resumed Advair   LOS (Days) 4 A FACE TO FACE EVALUATION WAS PERFORMED  Marcques Wrightsman T 03/07/2013, 8:38 AM

## 2013-03-07 NOTE — Progress Notes (Signed)
Placed patient on home CPAP at 16cm. SP02=97%

## 2013-03-08 ENCOUNTER — Inpatient Hospital Stay (HOSPITAL_COMMUNITY): Payer: Managed Care, Other (non HMO) | Admitting: Occupational Therapy

## 2013-03-08 ENCOUNTER — Inpatient Hospital Stay (HOSPITAL_COMMUNITY): Payer: Managed Care, Other (non HMO) | Admitting: Speech Pathology

## 2013-03-08 ENCOUNTER — Inpatient Hospital Stay (HOSPITAL_COMMUNITY): Payer: Self-pay | Admitting: Rehabilitation

## 2013-03-08 ENCOUNTER — Inpatient Hospital Stay (HOSPITAL_COMMUNITY): Payer: Self-pay | Admitting: Occupational Therapy

## 2013-03-08 DIAGNOSIS — I824Z9 Acute embolism and thrombosis of unspecified deep veins of unspecified distal lower extremity: Secondary | ICD-10-CM

## 2013-03-08 LAB — GLUCOSE, CAPILLARY
Glucose-Capillary: 279 mg/dL — ABNORMAL HIGH (ref 70–99)
Glucose-Capillary: 377 mg/dL — ABNORMAL HIGH (ref 70–99)
Glucose-Capillary: 306 mg/dL — ABNORMAL HIGH (ref 70–99)
Glucose-Capillary: 199 mg/dL — ABNORMAL HIGH (ref 70–99)

## 2013-03-08 MED ORDER — INSULIN ASPART 100 UNIT/ML ~~LOC~~ SOLN
8.0000 [IU] | Freq: Three times a day (TID) | SUBCUTANEOUS | Status: DC
  Administered 2013-03-08 – 2013-03-17 (×28): 8 [IU] via SUBCUTANEOUS

## 2013-03-08 NOTE — Progress Notes (Addendum)
Physical Therapy Session Note  Patient Details  Name: Patricia Black MRN: [id number] Date of Birth: 1951/02/09  Today's Date: 03/08/2013 Time: 0830-928 Time Calculation (min): 58 mins  Short Term Goals: Week 1:  PT Short Term Goal 1 (Week 1): Pt will be supervision with Transfers (bed<>chair) PT Short Term Goal 2 (Week 1): Pt will be able to ambulate 100 feet using LRAD at min A and min cues.  PT Short Term Goal 3 (Week 1): Pt will be able to negotiate up/down 8 steps using a right railing with min A and min cues.   Skilled Therapeutic Interventions/Progress Updates:   Pt received sitting in w/c in room and agreeable to therapy this morning.  Pt self propelled to therapy gym at S level with min cues for technique and safety of LUE.  Once in hallway, performed gait training with hemi walker.  See details below.  She is doing very well and is now min assist.  Performed car transfer with min assist to elevate LLE into and out of car.  She does well initiating self assist via RUE to assist LLE into/out of car, however her body habitus makes this difficult to complete task.  Performed sit <> stand x 5 reps with small block under RLE and with BUEs on LLE to facilitate increased WB and weight shift through LLE during standing.  Provided mod assist to stand in this manner due to weak quads/glutes.  Also provided assist for forward weight shift and tapping at glutes for increased activation to attain upright posture.  Assisted pt back to room and left in w/c with quick release belt donned and all needs in reach.   Therapy Documentation Precautions:  Precautions Precautions: Fall Precaution Comments: Impulsive, L hemiplegia, possible left visual field deficit Restrictions Weight Bearing Restrictions: No   Vital Signs: Therapy Vitals Pulse Rate: 70 BP: 130/52 mmHg Patient Position, if appropriate: Sitting Pain: Pain Assessment Pain Assessment: No/denies pain   Locomotion  : Ambulation Ambulation: Yes Ambulation/Gait Assistance: 4: Min assist Ambulation Distance (Feet): 90 Feet (x2 reps) Assistive device: Hemi-walker Ambulation/Gait Assistance Details: Verbal cues for sequencing;Verbal cues for precautions/safety;Verbal cues for technique;Verbal cues for gait pattern;Manual facilitation for weight shifting;Verbal cues for safe use of DME/AE;Manual facilitation for weight bearing Ambulation/Gait Assistance Details: Continue to gait train in hallway with use of hemi walker.  Did not don ace wrap as we performed exercises prior to ambuation with increased quad control noted.  She states that knee feels better, however did note some recurvatum, esp when more fatigued.  Also continues to require min manual assist for increased weight shift R for increased WB and tactile cues at glutes for increased activation during L stance phase of gait, as she tends to demonstrate Trendelenbug type gait pattern.    Gait Gait: Yes Gait Pattern: Impaired Gait Pattern: Decreased step length - left;Decreased dorsiflexion - left;Decreased weight shift to right;Decreased stance time - left;Step-to pattern;Decreased weight shift to left;Trunk flexed;Narrow base of support;Trendelenburg Wheelchair Mobility Wheelchair Mobility: Yes Wheelchair Assistance: 5: Supervision Wheelchair Assistance Details: Verbal cues for technique;Verbal cues for sequencing;Verbal cues for precautions/safety Wheelchair Propulsion: Right upper extremity;Both lower extermities Wheelchair Parts Management: Needs assistance Distance: 150     Exercises: performed supine B LE bridging x 10 reps, L LE bridging x 10 reps, SLR (slightly AAROM) x 10 reps LLE, hip abd x 10 reps LLE, SAQ x 10 reps LLE.  See FIM for current functional status  Therapy/Group: Individual Therapy  Vista Deck 03/08/2013, 12:09  PM  

## 2013-03-08 NOTE — Progress Notes (Signed)
Occupational Therapy Session Note  Patient Details  Name: Patricia Black MRN: [id number] Date of Birth: [birth year]  Today's Date: 03/08/2013 Time: 1103-1205 Time Calculation (min): 62 min  Short Term Goals: Week 1:  OT Short Term Goal 1 (Week 1): Pt will perform UB dressing with supervision and mod instructional cueing. OT Short Term Goal 2 (Week 1): Pt will donn pullover underpants and pants with min assist sit to stand. OT Short Term Goal 3 (Week 1): Pt will perform toilet transfers with min assist to elevated toilet/3:1. OT Short Term Goal 4 (Week 1): Pt will use the LUE as a stabilizer with min assist during bathing tasks. OT Short Term Goal 5 (Week 1): Pt will initiate and perform all bathing with no more than min instructional cueing during session.  Skilled Therapeutic Interventions/Progress Updates:    Pt seen for ADL retraining with focus on sit <> stand, transfers, and hemi-technique with bathing and dressing.  Pt performed stand pivot transfer from w/c to tub bench with use of grab bars and min-mod assist for pivot.  Pt completed bathing at sit> stand level with min/steady assist in standing when washing buttocks and demonstrated use of long handled sponge to complete LB bathing.  Engaged in dressing at sink with pt able to thread BLE through underwear and pants, requiring assistance to pull clothing over Lt hip.  Pt donned tank top with forgetting to thread LUE and then attempted to don shirt backwards through bottom of sleeve.  Due to limited time, this therapist donned socks and shoe and reapplied resting hand splint.  Therapy Documentation Precautions:  Precautions Precautions: Fall Precaution Comments: Impulsive, L hemiplegia, possible left visual field deficit Restrictions Weight Bearing Restrictions: No General:   Vital Signs: Therapy Vitals Pulse Rate: 70 BP: 130/52 mmHg Patient Position, if appropriate: Sitting Pain: Pain Assessment Pain Assessment: No/denies  pain  See FIM for current functional status  Therapy/Group: Individual Therapy  Rosalio Loud 03/08/2013, 12:09 PM

## 2013-03-08 NOTE — Progress Notes (Signed)
Occupational Therapy Session Note  Patient Details  Name: Patricia Black MRN: [id number] Date of Birth: 1950/05/10  Today's Date: 03/08/2013 Time: 4098-1191 Time Calculation (min): 38 min  Skilled Therapeutic Interventions/Progress Updates:    Session focused on setting pt up on the H200 NMES for digit flexion and extension.  Pt performed 5 mins of open exercise mode in fast 3 phase to help reduce flexor tone by allowing pulsating stimulation to the digit extensors.  Progressed to exercise mode with digit flexion and extension for 5 mins during session as well.  Pt with no adverse reactions to stimulation during session.  Finished session by taking pt back to the room where she transferred with mod assist stand pivot to the bed from the wheelchair.  Pt also removed her shoes and socks with supervision as well.  Positioned LUE on pillow beside her in bed with digits extended.  Instructed pt to have nursing assist her with donning hand splint later today.  She had also been wearing it prior to our session.    Therapy Documentation Precautions:  Precautions Precautions: Fall Precaution Comments: Impulsive, L hemiplegia, possible left visual field deficit Restrictions Weight Bearing Restrictions: No  Pain: Pain Assessment Pain Assessment: No/denies pain ADL: See FIM for current functional status  Therapy/Group: Individual Therapy  Kenli Waldo OTR/L 03/08/2013, 3:17 PM

## 2013-03-08 NOTE — Progress Notes (Signed)
Speech Language Pathology Daily Session Note  Patient Details  Name: Patricia Black MRN: [id number] Date of Birth: January 07, 1951  Today's Date: 03/08/2013 Time: 4098-1191 Time Calculation (min): 45 min  Short Term Goals: Week 1: SLP Short Term Goal 1 (Week 1): Patient will identify 2/3 effective speech intelligibilty strategies with Supervision level question cues SLP Short Term Goal 2 (Week 1): Patient will self-monitor and correct speech intelligibility errors with Min verbal cues SLP Short Term Goal 3 (Week 1): Patient will initiate, sequence and solve moderately complex problems with Min verbal cues  Skilled Therapeutic Interventions: Skilled treatment session focused on addressing cognitive goals. Patient with dentures placed today and following weekend therapy session patient much more intelligible; Mod I throughout session.  SLP facilitated session with daily math problems and Min verbal cues to self-monitor and correct calculation errors. SLP also facilitated session with verbal sequencing task; patient required Supervision question cues to include all details of a cooking task.  Continue plan of care and functional sequencing task.   FIM:  Comprehension Comprehension Mode: Auditory Comprehension: 6-Follows complex conversation/direction: With extra time/assistive device Expression Expression Mode: Verbal Expression: 6-Expresses complex ideas: With extra time/assistive device Social Interaction Social Interaction: 5-Interacts appropriately 90% of the time - Needs monitoring or encouragement for participation or interaction. Problem Solving Problem Solving: 5-Solves basic problems: With no assist Memory Memory: 5-Recognizes or recalls 90% of the time/requires cueing < 10% of the time  Pain Pain Assessment Pain Assessment: No/denies pain Pain Score: 0-[name]  Therapy/Group: Individual Therapy  [name], CCC-SLP [phone]  Zamani Crocker 03/08/2013, 11:53 AM

## 2013-03-08 NOTE — Progress Notes (Signed)
62 y.o. right handed female history of migraine headaches as well as diabetes mellitus (insulin pump) who was admitted 02/24/2013 with left-sided weakness,dizziness as well as fall on her back porch-- found by [facility] when family was unable to get in touch with her. 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 MRI /MRA brain showed acute infarcts in the right basal ganglia and posterior right MCA territory with associated edema and occluded R-MCA beyond origin. Echocardiogram with ejection fraction 70% grade 2 diastolic dysfunction. [name]. Neurology recommended ASA for embolic infarct due to occlusion of R-MCA artery and TEE ordered for workup. TEE revealed PFO and BLE dopplers were positive for L-mid peroneal vein DVT. On Xarelto  Subjective/Complaints: No HA, Cough improved after Robitussin Review of Systems - Negative except weakness Left arm   Objective: Vital Signs: Blood pressure 136/45, pulse 61, temperature 97.7 F (36.5 C), temperature source Oral, resp. rate 17, height 5' 6.93" (1.7 m), weight 117.255 kg (258 lb 8 oz), SpO2 95.00%. No results found. Results for orders placed during the hospital encounter of 03/03/13 (from the past 72 hour(s))  GLUCOSE, CAPILLARY     Status: Abnormal   Collection Time    03/05/13 11:27 AM      Result Value Range   Glucose-Capillary 311 (*) 70 - 99 mg/dL  GLUCOSE, CAPILLARY     Status: Abnormal   Collection Time    03/05/13  5:10 PM      Result Value Range   Glucose-Capillary 258 (*) 70 - 99 mg/dL  GLUCOSE, CAPILLARY     Status: Abnormal   Collection Time    03/05/13  8:44 PM      Result Value Range   Glucose-Capillary 343 (*) 70 - 99 mg/dL   Comment 1 Notify RN    GLUCOSE, CAPILLARY     Status: Abnormal   Collection Time    03/06/13  7:26 AM      Result Value Range   Glucose-Capillary 297 (*) 70 - 99 mg/dL  GLUCOSE, CAPILLARY     Status: Abnormal   Collection Time   03/06/13 11:58 AM      Result Value Range   Glucose-Capillary 344 (*) 70 - 99 mg/dL  GLUCOSE, CAPILLARY     Status: Abnormal   Collection Time    03/06/13  5:16 PM      Result Value Range   Glucose-Capillary 285 (*) 70 - 99 mg/dL  GLUCOSE, CAPILLARY     Status: Abnormal   Collection Time    03/06/13  8:28 PM      Result Value Range   Glucose-Capillary 339 (*) 70 - 99 mg/dL   Comment 1 Notify RN    GLUCOSE, CAPILLARY     Status: Abnormal   Collection Time    03/07/13  7:26 AM      Result Value Range   Glucose-Capillary 317 (*) 70 - 99 mg/dL   Comment 1 Notify RN    GLUCOSE, CAPILLARY     Status: Abnormal   Collection Time    03/07/13 11:28 AM      Result Value Range   Glucose-Capillary 321 (*) 70 - 99 mg/dL   Comment 1 Notify RN    GLUCOSE, CAPILLARY     Status: Abnormal   Collection Time    03/07/13  4:23 PM      Result Value Range   Glucose-Capillary 288 (*) 70 - 99 mg/dL  Comment 1 Notify RN    GLUCOSE, CAPILLARY     Status: Abnormal   Collection Time    03/07/13  8:45 PM      Result Value Range   Glucose-Capillary 295 (*) 70 - 99 mg/dL   Comment 1 Notify RN    GLUCOSE, CAPILLARY     Status: Abnormal   Collection Time    03/08/13  7:30 AM      Result Value Range   Glucose-Capillary 279 (*) 70 - 99 mg/dL   Comment 1 Notify RN       HEENT: normal. No congestion, drainage Cardio: RRR and no Murmur Resp: CTA B/L and unlabored. No wheezes or rhonchi. Chest clear GI: BS positive and non tender Extremity:  Pulses positive and No Edema Skin:   Intact Neuro: Alert/Oriented, Cranial Nerve Abnormalities Left central 7, , Normal Sensory, Abnormal Motor 2-/5 Left bi, tri, trace grip, trace abd, Abnormal FMC Ataxic/ dec FMC and Dysarthric Musc/Skel:  Other no pain with L shoulder ROM Gen NAD, alert, pleasant   Assessment/Plan: 1. Functional deficits secondary to R MCA embolic infarct  through PFO which require 3+ hours per day of interdisciplinary therapy in a  comprehensive inpatient rehab setting. Physiatrist is providing close team supervision and 24 hour management of active medical problems listed below. Physiatrist and rehab team continue to assess barriers to discharge/monitor patient progress toward functional and medical goals. FIM: FIM - Bathing Bathing Steps Patient Completed: Chest;Left Arm;Abdomen;Right upper leg;Left upper leg;Front perineal area;Buttocks Bathing: 3: Mod-Patient completes 5-7 75f 10 parts or 50-74%  FIM - Upper Body Dressing/Undressing Upper body dressing/undressing steps patient completed: Thread/unthread right sleeve of pullover shirt/dresss;Put head through opening of pull over shirt/dress Upper body dressing/undressing: 3: Mod-Patient completed 50-74% of tasks FIM - Lower Body Dressing/Undressing Lower body dressing/undressing steps patient completed: Thread/unthread right pants leg;Thread/unthread right underwear leg;Thread/unthread left underwear leg Lower body dressing/undressing: 2: Max-Patient completed 25-49% of tasks        FIM - Banker Devices: Arm rests Bed/Chair Transfer: 3: Bed > Chair or W/C: Mod A (lift or lower assist);3: Chair or W/C > Bed: Mod A (lift or lower assist)  FIM - Locomotion: Wheelchair Distance: Pt propeled standard light weight wheel chair 150 feet on level surfaces using BLEs and RUE, with min A to negotiate turns and obstacles.  Locomotion: Wheelchair: 5: Travels 150 ft or more: maneuvers on rugs and over door sills with supervision, cueing or coaxing FIM - Locomotion: Ambulation Locomotion: Ambulation Assistive Devices: Chief Operating Officer Ambulation/Gait Assistance: 4: Min assist Locomotion: Ambulation: 2: Travels 50 - 149 ft with minimal assistance (Pt.>75%)  Comprehension Comprehension Mode: Auditory Comprehension: 5-Understands complex 90% of the time/Cues < 10% of the time  Expression Expression Mode: Verbal Expression: 5-Expresses  basic 90% of the time/requires cueing < 10% of the time.  Social Interaction Social Interaction: 5-Interacts appropriately 90% of the time - Needs monitoring or encouragement for participation or interaction.  Problem Solving Problem Solving: 5-Solves basic problems: With no assist  Memory Memory: 5-Recognizes or recalls 90% of the time/requires cueing < 10% of the time  Medical Problem List and Plan:  1. DVT left mid peroneal vein/Anticoagulation: Pharmaceutical: Xarelto  2. Pain Management: prn tylenol effective for pain.  3. Mood: Has good outlook and aware of time needed for recovery.    4. Neuropsych: This patient is capable of making decisions on her own behalf.  5. DM type 2--insulin dependent:uncontrolled On insulin pump at  home--titrate levemir further today for basal insulin and use SSI as needed. Po intake is good.  6. Migraines: resumed Topamax and inderal at lower dose to avoid recurrence. Titrate as needed. --controlled for the most part-  -continue to assess 7. Oral pain: improved 8. Hyperactive airway: resumed Advair   LOS (Days) 5 A FACE TO FACE EVALUATION WAS PERFORMED  Moishe Schellenberg E 03/08/2013, 8:08 AM

## 2013-03-09 ENCOUNTER — Inpatient Hospital Stay (HOSPITAL_COMMUNITY): Payer: Managed Care, Other (non HMO) | Admitting: Speech Pathology

## 2013-03-09 ENCOUNTER — Inpatient Hospital Stay (HOSPITAL_COMMUNITY): Payer: Self-pay | Admitting: Occupational Therapy

## 2013-03-09 ENCOUNTER — Inpatient Hospital Stay (HOSPITAL_COMMUNITY): Payer: Managed Care, Other (non HMO) | Admitting: Occupational Therapy

## 2013-03-09 ENCOUNTER — Inpatient Hospital Stay (HOSPITAL_COMMUNITY): Payer: Self-pay | Admitting: Rehabilitation

## 2013-03-09 DIAGNOSIS — G8110 Spastic hemiplegia affecting unspecified side: Secondary | ICD-10-CM

## 2013-03-09 DIAGNOSIS — I6330 Cerebral infarction due to thrombosis of unspecified cerebral artery: Secondary | ICD-10-CM

## 2013-03-09 DIAGNOSIS — E1165 Type 2 diabetes mellitus with hyperglycemia: Secondary | ICD-10-CM

## 2013-03-09 LAB — GLUCOSE, CAPILLARY
Glucose-Capillary: 292 mg/dL — ABNORMAL HIGH (ref 70–99)
Glucose-Capillary: 206 mg/dL — ABNORMAL HIGH (ref 70–99)

## 2013-03-09 MED ORDER — IPRATROPIUM-ALBUTEROL 20-100 MCG/ACT IN AERS
1.0000 | INHALATION_SPRAY | Freq: Four times a day (QID) | RESPIRATORY_TRACT | Status: DC | PRN
  Filled 2013-03-09: qty 4

## 2013-03-09 MED ORDER — INSULIN ASPART 100 UNIT/ML ~~LOC~~ SOLN
0.0000 [IU] | Freq: Every day | SUBCUTANEOUS | Status: DC
Start: 2013-03-09 — End: 2013-03-17
  Administered 2013-03-09: 2 [IU] via SUBCUTANEOUS
  Administered 2013-03-10 – 2013-03-11 (×2): 3 [IU] via SUBCUTANEOUS
  Administered 2013-03-13: 2 [IU] via SUBCUTANEOUS
  Administered 2013-03-14: 3 [IU] via SUBCUTANEOUS

## 2013-03-09 MED ORDER — INSULIN ASPART 100 UNIT/ML ~~LOC~~ SOLN
0.0000 [IU] | Freq: Three times a day (TID) | SUBCUTANEOUS | Status: DC
  Administered 2013-03-09: 11 [IU] via SUBCUTANEOUS
  Administered 2013-03-09: 8 [IU] via SUBCUTANEOUS
  Administered 2013-03-10: 5 [IU] via SUBCUTANEOUS
  Administered 2013-03-10: 8 [IU] via SUBCUTANEOUS
  Administered 2013-03-10 – 2013-03-11 (×2): 5 [IU] via SUBCUTANEOUS
  Administered 2013-03-11: 8 [IU] via SUBCUTANEOUS
  Administered 2013-03-11: 5 [IU] via SUBCUTANEOUS
  Administered 2013-03-12: 3 [IU] via SUBCUTANEOUS
  Administered 2013-03-12: 5 [IU] via SUBCUTANEOUS
  Administered 2013-03-12: 11 [IU] via SUBCUTANEOUS
  Administered 2013-03-13: 8 [IU] via SUBCUTANEOUS
  Administered 2013-03-13: 5 [IU] via SUBCUTANEOUS
  Administered 2013-03-13 – 2013-03-14 (×3): 8 [IU] via SUBCUTANEOUS
  Administered 2013-03-14: 11 [IU] via SUBCUTANEOUS
  Administered 2013-03-15: 5 [IU] via SUBCUTANEOUS
  Administered 2013-03-15: 3 [IU] via SUBCUTANEOUS
  Administered 2013-03-15 – 2013-03-16 (×2): 8 [IU] via SUBCUTANEOUS
  Administered 2013-03-16: 11 [IU] via SUBCUTANEOUS
  Administered 2013-03-16: 09:00:00 via SUBCUTANEOUS
  Administered 2013-03-17: 5 [IU] via SUBCUTANEOUS
  Administered 2013-03-17: 8 [IU] via SUBCUTANEOUS

## 2013-03-09 MED ORDER — GUAIFENESIN ER 600 MG PO TB12
600.0000 mg | ORAL_TABLET | Freq: Two times a day (BID) | ORAL | Status: DC
  Administered 2013-03-09 – 2013-03-20 (×22): 600 mg via ORAL
  Filled 2013-03-09 (×27): qty 1

## 2013-03-09 MED ORDER — IPRATROPIUM-ALBUTEROL 20-100 MCG/ACT IN AERS
1.0000 | INHALATION_SPRAY | Freq: Four times a day (QID) | RESPIRATORY_TRACT | Status: AC
  Administered 2013-03-09 (×3): 1 via RESPIRATORY_TRACT
  Filled 2013-03-09: qty 4

## 2013-03-09 MED ORDER — INSULIN DETEMIR 100 UNIT/ML ~~LOC~~ SOLN
25.0000 [IU] | Freq: Every day | SUBCUTANEOUS | Status: DC
  Administered 2013-03-09 – 2013-03-10 (×2): 25 [IU] via SUBCUTANEOUS
  Filled 2013-03-09 (×2): qty 0.25

## 2013-03-09 NOTE — Progress Notes (Signed)
Occupational Therapy Session Note  Patient Details  Name: Patricia Black MRN: [id number] Date of Birth: [birth year]  Today's Date: 03/09/2013 Time: 7829-5621 Time Calculation (min): 31 min  Skilled Therapeutic Interventions/Progress Updates:    Pt worked on Chief of Staff with incoproration of [place] H200 NMES.  Utilized exercise mode initially while having pt work on simple shoulder flexion, reaching forward with the LUE while stimulation helped to open her hand.  Had her work on shoulder extension and bringing her hand back to her side when digit flexion was stimulated.  Progressed to having pt reach for cups placed on the rolling stool and bringing them up to the mat to stack them using grasp program.  Pt needing max facilitation to pick up the cups and bring beside of her.  Pt voices pleasure in seeing her hand and fingers move with functional tasks.  No adverse reactions to stimuli.    Therapy Documentation Precautions:  Precautions Precautions: Fall Precaution Comments: Impulsive, L hemiplegia, possible left visual field deficit Restrictions Weight Bearing Restrictions: No  Pain: Pain Assessment Pain Assessment: No/denies pain ADL: See FIM for current functional status  Therapy/Group: Individual Therapy  Giuseppe Duchemin OTR/L 03/09/2013, 3:36 PM

## 2013-03-09 NOTE — Progress Notes (Signed)
Speech Language Pathology Daily Session Note  Patient Details  Name: Patricia Black MRN: [id number] Date of Birth: 06-12-1950  Today's Date: 03/09/2013 Time: 4098-1191 Time Calculation (min): 40 min  Short Term Goals: Week 1: SLP Short Term Goal 1 (Week 1): Patient will identify 2/3 effective speech intelligibilty strategies with Supervision level question cues SLP Short Term Goal 2 (Week 1): Patient will self-monitor and correct speech intelligibility errors with Min verbal cues SLP Short Term Goal 3 (Week 1): Patient will initiate, sequence and solve moderately complex problems with Min verbal cues  Skilled Therapeutic Interventions: Skilled treatment session focused on addressing cognitive goals. SLP facilitated session with functional problem solving during toileting, self-care and money management tasks.  Patient required Min physical assist during transfers, increased wait time for problem solving with basic tasks and Mod cues for money management (especially with subtraction).  SLP also facilitated session with 4 step picture sequencing tasks; patient required Supervision question cues x1 and was then able to self-monitor and correct error.  Continue plan of care.   FIM:  Comprehension Comprehension Mode: Auditory Comprehension: 5-Understands complex 90% of the time/Cues < 10% of the time Expression Expression Mode: Verbal Expression: 5-Expresses complex 90% of the time/cues < 10% of the time Social Interaction Social Interaction: 5-Interacts appropriately 90% of the time - Needs monitoring or encouragement for participation or interaction. Problem Solving Problem Solving: 5-Solves basic problems: With no assist Memory Memory: 5-Recognizes or recalls 90% of the time/requires cueing < 10% of the time  Pain Pain Assessment Pain Assessment: No/denies pain  Therapy/Group: Individual Therapy  [name], CCC-SLP [phone]  Patricia Black 03/09/2013, 4:32 PM

## 2013-03-09 NOTE — Progress Notes (Signed)
Physical Therapy Session Note  Patient Details  Name: Patricia Black MRN: [id number] Date of Birth: [birth year]  Today's Date: 03/09/2013 Time: 4098-1191 Time Calculation (min): 56 min  Short Term Goals: Week 1:  PT Short Term Goal 1 (Week 1): Pt will be supervision with Transfers (bed<>chair) PT Short Term Goal 2 (Week 1): Pt will be able to ambulate 100 feet using LRAD at min A and min cues.  PT Short Term Goal 3 (Week 1): Pt will be able to negotiate up/down 8 steps using a right railing with min A and min cues.   Skilled Therapeutic Interventions/Progress Updates:   Pt received lying in bed this morning and agreeable to therapy.  Performed supine >sit with HOB flat and without handrail this morning at mod assist to elevate trunk into sitting.  She is able to roll with min/guard and bring LEs out of bed on her own with cues for safety and technique.  Performed stand pivot transfer bed >chair at min assist with cues for upright posture, hand placement, and properly advancing LEs all the way to chair prior to sitting.  Assisted to therapy gym to work on stepping up/down 6" step with and without use of RUE for support in order to work on increased weight shift to LLE for increased WB and to increase adequate quad control without knee hyperextension.  Progressed to stair negotiation x 3, 4" steps and 2, 6" steps with use of R handrail.  Provided max verbal and demonstration cues for safe sequence/technique, however noted increased difficulty with motor planning this morning.  Performed ambulation back and fourth (approx 10') placing and picking up cups from lower surface in order to work on quad strengthening in LLE.  Also used task to work on turns, and again noted that she had some increased difficulty with motor planning and required step by step instructional cuing for task.  Pt very fatigued during this activity and required two extended seated rest breaks.  Also noted increased difficulty clearing  LLE during ambulation, and increased genu recurvatum, therefore applied ace wrap for second rep to provide DF assist and prevent knee hyperextension.  Pt assisted back to room and left in w/c with quick release belt donned and all needs in reach.   Therapy Documentation Precautions:  Precautions Precautions: Fall Precaution Comments: Impulsive, L hemiplegia, possible left visual field deficit Restrictions Weight Bearing Restrictions: No   Vital Signs: Therapy Vitals Temp: 98 F (36.7 C) Temp src: Oral Pulse Rate: 66 Resp: 18 BP: 105/64 mmHg Patient Position, if appropriate: Lying Oxygen Therapy SpO2: 97 % O2 Device: None (Room air) Pain: Pt with some discomfort in L knee, however states that it just feels "tired." Allowed several seated rest breaks and wrapped LLE to prevent knee hyperextension.   See FIM for current functional status  Therapy/Group: Individual Therapy  Vista Deck 03/09/2013, 9:28 AM

## 2013-03-09 NOTE — Progress Notes (Signed)
Occupational Therapy Session Note  Patient Details  Name: Patricia Black MRN: [id number] Date of Birth: 05-06-1950  Today's Date: 03/09/2013 Time: 9562-1308 Time Calculation (min): 59 min  Short Term Goals: Week 1:  OT Short Term Goal 1 (Week 1): Pt will perform UB dressing with supervision and mod instructional cueing. OT Short Term Goal 2 (Week 1): Pt will donn pullover underpants and pants with min assist sit to stand. OT Short Term Goal 3 (Week 1): Pt will perform toilet transfers with min assist to elevated toilet/3:1. OT Short Term Goal 4 (Week 1): Pt will use the LUE as a stabilizer with min assist during bathing tasks. OT Short Term Goal 5 (Week 1): Pt will initiate and perform all bathing with no more than min instructional cueing during session.  Skilled Therapeutic Interventions/Progress Updates:    Pt seen for ADL retraining with focus on hemi-technique with self-care tasks and increased independence with LB dressing.  Pt requested to wash up at sink this session with only completing UB bathing and perineal area.  Pt attempted to wash RUE with Lt hand by wrapping wash cloth around hand, however decreased grasp and shoulder movement required hand over hand assist to improve success with washing.  Educated pt on alternative technique to washing RUE by rubbing over washcloth on Rt leg.  Pt donned shirt this session with cues to gather Lt sleeve to assist in success with donning Lt sleeve.  Also introduced use of step stool to assist in donning socks while educating on one handed technique to don socks.  Pt donned both socks and shoes this session, therapist tied shoes.  Discussed elastic shoelaces, plan to introduce during next session.  Therapy Documentation Precautions:  Precautions Precautions: Fall Precaution Comments: Impulsive, L hemiplegia, possible left visual field deficit Restrictions Weight Bearing Restrictions: No General:   Vital Signs: Oxygen Therapy O2 Device:  None (Room air) Pain:  Pt with no c/o pain this session.  See FIM for current functional status  Therapy/Group: Individual Therapy  Rosalio Loud 03/09/2013, 1:14 PM

## 2013-03-09 NOTE — Progress Notes (Signed)
62 y.o. right handed female history of migraine headaches as well as diabetes mellitus (insulin pump) who was admitted 02/24/2013 with left-sided weakness,dizziness as well as fall on her back porch-- found by [facility] when family was unable to get in touch with her. 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 MRI /MRA brain showed acute infarcts in the right basal ganglia and posterior right MCA territory with associated edema and occluded R-MCA beyond origin. Echocardiogram with ejection fraction 70% grade 2 diastolic dysfunction. [name]. Neurology recommended ASA for embolic infarct due to occlusion of R-MCA artery and TEE ordered for workup. TEE revealed PFO and BLE dopplers were positive for L-mid peroneal vein DVT. On Xarelto  Subjective/Complaints: Doesn't see much improvement with LUE yet [name] in shoulder Review of Systems - Negative except weakness Left arm   Objective: Vital Signs: Blood pressure 105/64, pulse 66, temperature 98 F (36.7 C), temperature source Oral, resp. rate 18, height 5\' 7"  (1.702 m), weight 117.255 kg (258 lb 8 oz), SpO2 97.00%. No results found. Results for orders placed during the hospital encounter of 03/03/13 (from the past 72 hour(s))  GLUCOSE, CAPILLARY     Status: Abnormal   Collection Time    03/06/13 11:58 AM      Result Value Range   Glucose-Capillary 344 (*) 70 - 99 mg/dL  GLUCOSE, CAPILLARY     Status: Abnormal   Collection Time    03/06/13  5:16 PM      Result Value Range   Glucose-Capillary 285 (*) 70 - 99 mg/dL  GLUCOSE, CAPILLARY     Status: Abnormal   Collection Time    03/06/13  8:28 PM      Result Value Range   Glucose-Capillary 339 (*) 70 - 99 mg/dL   Comment 1 Notify RN    GLUCOSE, CAPILLARY     Status: Abnormal   Collection Time    03/07/13  7:26 AM      Result Value Range   Glucose-Capillary 317 (*) 70 - 99 mg/dL   Comment 1 Notify RN    GLUCOSE,  CAPILLARY     Status: Abnormal   Collection Time    03/07/13 11:28 AM      Result Value Range   Glucose-Capillary 321 (*) 70 - 99 mg/dL   Comment 1 Notify RN    GLUCOSE, CAPILLARY     Status: Abnormal   Collection Time    03/07/13  4:23 PM      Result Value Range   Glucose-Capillary 288 (*) 70 - 99 mg/dL   Comment 1 Notify RN    GLUCOSE, CAPILLARY     Status: Abnormal   Collection Time    03/07/13  8:45 PM      Result Value Range   Glucose-Capillary 295 (*) 70 - 99 mg/dL   Comment 1 Notify RN    GLUCOSE, CAPILLARY     Status: Abnormal   Collection Time    03/08/13  7:30 AM      Result Value Range   Glucose-Capillary 279 (*) 70 - 99 mg/dL   Comment 1 Notify RN    GLUCOSE, CAPILLARY     Status: Abnormal   Collection Time    03/08/13 12:12 PM      Result Value Range   Glucose-Capillary 377 (*) 70 - 99 mg/dL  GLUCOSE, CAPILLARY     Status: Abnormal   Collection Time  03/08/13  4:25 PM      Result Value Range   Glucose-Capillary 306 (*) 70 - 99 mg/dL   Comment 1 Notify RN    GLUCOSE, CAPILLARY     Status: Abnormal   Collection Time    03/08/13  8:45 PM      Result Value Range   Glucose-Capillary 199 (*) 70 - 99 mg/dL   Comment 1 Notify RN    GLUCOSE, CAPILLARY     Status: Abnormal   Collection Time    03/09/13  7:37 AM      Result Value Range   Glucose-Capillary 292 (*) 70 - 99 mg/dL   Comment 1 Notify RN       HEENT: normal. No congestion, drainage Cardio: RRR and no Murmur Resp: CTA B/L and unlabored. No wheezes or rhonchi. Chest clear GI: BS positive and non tender Extremity:  Pulses positive and No Edema Skin:   Intact Neuro: Alert/Oriented, Cranial Nerve Abnormalities Left central 7, , Normal Sensory, Abnormal Motor 2-/5 Left bi, tri, trace grip, trace abd, Abnormal FMC Ataxic/ dec FMC and Dysarthric Musc/Skel:  Other no pain with L shoulder ROM Gen NAD, alert, pleasant   Assessment/Plan: 1. Functional deficits secondary to R MCA embolic infarct  through  PFO which require 3+ hours per day of interdisciplinary therapy in a comprehensive inpatient rehab setting. Physiatrist is providing close team supervision and 24 hour management of active medical problems listed below. Physiatrist and rehab team continue to assess barriers to discharge/monitor patient progress toward functional and medical goals. FIM: FIM - Bathing Bathing Steps Patient Completed: Chest;Left Arm;Abdomen;Right upper leg;Left upper leg;Front perineal area;Buttocks;Right lower leg (including foot);Left lower leg (including foot) Bathing: 4: Min-Patient completes 8-9 34f 10 parts or 75+ percent  FIM - Upper Body Dressing/Undressing Upper body dressing/undressing steps patient completed: Thread/unthread right sleeve of pullover shirt/dresss;Put head through opening of pull over shirt/dress Upper body dressing/undressing: 3: Mod-Patient completed 50-74% of tasks FIM - Lower Body Dressing/Undressing Lower body dressing/undressing steps patient completed: Thread/unthread right underwear leg;Thread/unthread left underwear leg;Thread/unthread right pants leg;Thread/unthread left pants leg Lower body dressing/undressing: 2: Max-Patient completed 25-49% of tasks  FIM - Toileting Toileting steps completed by patient: Adjust clothing prior to toileting;Performs perineal hygiene;Adjust clothing after toileting Toileting: 4: Steadying assist (per Loretta Plume, NT)  FIM - Toilet Transfers Toilet Transfers Assistive Devices: Grab bars Toilet Transfers: 4-To toilet/BSC: Min A (steadying Pt. > 75%);4-From toilet/BSC: Min A (steadying Pt. > 75%) (per Loretta Plume, NT)  FIM - Bed/Chair Transfer Bed/Chair Transfer Assistive Devices: Arm rests Bed/Chair Transfer: 4: Bed > Chair or W/C: Min A (steadying Pt. > 75%);4: Chair or W/C > Bed: Min A (steadying Pt. > 75%)  FIM - Locomotion: Wheelchair Distance: 150 Locomotion: Wheelchair: 5: Travels 150 ft or more: maneuvers on rugs and over door sills  with supervision, cueing or coaxing FIM - Locomotion: Ambulation Locomotion: Ambulation Assistive Devices: Chief Operating Officer Ambulation/Gait Assistance: 4: Min assist Locomotion: Ambulation: 2: Travels 50 - 149 ft with minimal assistance (Pt.>75%)  Comprehension Comprehension Mode: Auditory Comprehension: 5-Understands complex 90% of the time/Cues < 10% of the time  Expression Expression Mode: Verbal Expression: 5-Expresses complex 90% of the time/cues < 10% of the time  Social Interaction Social Interaction: 5-Interacts appropriately 90% of the time - Needs monitoring or encouragement for participation or interaction.  Problem Solving Problem Solving: 5-Solves basic problems: With no assist  Memory Memory: 5-Recognizes or recalls 90% of the time/requires cueing < 10% of the time  Medical Problem List and Plan:  1. DVT left mid peroneal vein/Anticoagulation: Pharmaceutical: Xarelto  2. Pain Management: prn tylenol effective for pain.  3. Mood: Has good outlook and aware of time needed for recovery.    4. Neuropsych: This patient is capable of making decisions on her own behalf.  5. DM type 2--insulin dependent:uncontrolled On insulin pump at home--titrate levemir further today for basal insulin and use SSI as needed. Po intake is good.  6. Migraines: resumed Topamax and inderal at lower dose to avoid recurrence. Titrate as needed. --controlled for the most part-  -continue to assess 7. Oral pain: improved 8. Hyperactive airway: resumed Advair   LOS (Days) 6 A FACE TO FACE EVALUATION WAS PERFORMED  KIRSTEINS,ANDREW E 03/09/2013, 8:44 AM

## 2013-03-09 NOTE — Progress Notes (Signed)
Social Work Patient ID: Patricia Black, female   DOB: 12/09/1950, 62 y.o.   MRN: [id number]  CSW met with pt and her son to receive necessary paperwork to send to pt's employer regarding FMLA and short-term disability.  CSW faxed paperwork and returned originals to pt's son.  CSW asked pt how she was doing, as she seemed quieter than usual.  Pt reports doing well, but feeling worn out from therapies. CSW will continue to follow.

## 2013-03-10 ENCOUNTER — Inpatient Hospital Stay (HOSPITAL_COMMUNITY): Payer: Managed Care, Other (non HMO) | Admitting: Speech Pathology

## 2013-03-10 ENCOUNTER — Encounter (HOSPITAL_COMMUNITY): Payer: Self-pay | Admitting: Occupational Therapy

## 2013-03-10 ENCOUNTER — Inpatient Hospital Stay (HOSPITAL_COMMUNITY): Payer: Self-pay | Admitting: Rehabilitation

## 2013-03-10 ENCOUNTER — Inpatient Hospital Stay (HOSPITAL_COMMUNITY): Payer: Managed Care, Other (non HMO) | Admitting: Occupational Therapy

## 2013-03-10 LAB — GLUCOSE, CAPILLARY
Glucose-Capillary: 233 mg/dL — ABNORMAL HIGH (ref 70–99)
Glucose-Capillary: 264 mg/dL — ABNORMAL HIGH (ref 70–99)
Glucose-Capillary: 241 mg/dL — ABNORMAL HIGH (ref 70–99)

## 2013-03-10 MED ORDER — HYDROCOD POLST-CHLORPHEN POLST 10-8 MG/5ML PO LQCR
5.0000 mL | Freq: Every evening | ORAL | Status: DC | PRN
  Administered 2013-03-10 – 2013-03-14 (×4): 5 mL via ORAL
  Filled 2013-03-10 (×4): qty 5

## 2013-03-10 NOTE — Progress Notes (Signed)
Patient refused to wear cpap tonight. RT will continue to monitor. 

## 2013-03-10 NOTE — Progress Notes (Signed)
Occupational Therapy Note  Patient Details  Name: Patricia Black MRN: [id number] Date of Birth: 1951/02/12 Today's Date: 03/10/2013  Pt missed 60 mins skilled OT treatment session secondary to not sleeping well due to constant coughing all night and restless legs.  Nurse tech present to which pt requested pain meds for headache.  Pt requested additional time to rest, followed up 30 mins later to find pt eating breakfast with son present.  Pt requested to be allowed time to finish breakfast.  Will follow up with pt during PM session.  Rosalio Loud 03/10/2013, 8:17 AM

## 2013-03-10 NOTE — Progress Notes (Signed)
Physical Therapy Session Note  Patient Details  Name: Patricia Black MRN: [id number] Date of Birth: 08-06-1950  Today's Date: 03/10/2013 Time:  -     Short Term Goals: Week 1:  PT Short Term Goal 1 (Week 1): Pt will be supervision with Transfers (bed<>chair) PT Short Term Goal 2 (Week 1): Pt will be able to ambulate 100 feet using LRAD at min A and min cues.  PT Short Term Goal 3 (Week 1): Pt will be able to negotiate up/down 8 steps using a right railing with min A and min cues.   Skilled Therapeutic Interventions/Progress Updates:   Pt received lying in bed this morning, but agreeable to therapy, despite missing OT this am.  She states she did not sleep well due to coughing.  Performed supine > sit with HOB flat and without use of handrails with min assist due to increased weakness in LUE and overall body habitus.  Performed several reps of stand pivot transfers at min assist level with mod cues for scooting to edge of chair prior to standing and also for increased forward weight shift and glute activation for more upright stance.  Pt self propelled >150' with use of BUEs and RUE to therapy gym at supervision with cues for avoiding obstacles intermittently on L side.  Pt able to make adjustments on her own with cues.  Performed seated nustep with BLEs only in order to increase quad and glute activation on LLE.  Able to tolerate 6 mins at level 4 resistance.  Performed gait training x 55' x 1 with use of hemi walker at min assist with ace wrap on LLE to provide DF assist and prevent knee hyperextension.  Provided manual facilitation for increased weight shift L and tapping cues to L glute as she continues to demonstrate Trendelenburg gait pattern due to glute weakness.  Also note increased knee hyperextension today, despite recurvatum wrap.  Provided verbal cues for increased step length bilaterally and increased weight shift L.  Performed ambulation without AD (with therapist providing assist  anterior to pt with B arm supported) in order to increase WB through LLE and decrease UE support.  Also performed side stepping in same manner to increased weight shifting R and L, increase WB through LLE, and also increase hip abd strength.  She was able to perform both at min to mod assist for steadying.  Assisted pt via w/c to speech therapy office for next session.    Therapy Documentation Precautions:  Precautions Precautions: Fall Precaution Comments: Impulsive, L hemiplegia, possible left visual field deficit Restrictions Weight Bearing Restrictions: No   Vital Signs: Therapy Vitals Temp: 97.6 F (36.4 C) Temp src: Oral Pulse Rate: 64 Resp: 19 BP: 132/60 mmHg Patient Position, if appropriate: Lying Oxygen Therapy SpO2: 97 % O2 Device: None (Room air) Pain: Pt with some "fatigue" in L knee, however denies pain.  See FIM for current functional status  Therapy/Group: Individual Therapy  Vista Deck 03/10/2013, 9:43 AM

## 2013-03-10 NOTE — Progress Notes (Signed)
62 y.o. right handed female history of migraine headaches as well as diabetes mellitus (insulin pump) who was admitted 02/24/2013 with left-sided weakness,dizziness as well as fall on her back porch-- found by [facility] when family was unable to get in touch with her. 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 MRI /MRA brain showed acute infarcts in the right basal ganglia and posterior right MCA territory with associated edema and occluded R-MCA beyond origin. Echocardiogram with ejection fraction 70% grade 2 diastolic dysfunction. [name]. Neurology recommended ASA for embolic infarct due to occlusion of R-MCA artery and TEE ordered for workup. TEE revealed PFO and BLE dopplers were positive for L-mid peroneal vein DVT. On Xarelto  Subjective/Complaints: OT notes increased tone Review of Systems - Negative except weakness Left arm   Objective: Vital Signs: Blood pressure 132/60, pulse 64, temperature 97.6 F (36.4 C), temperature source Oral, resp. rate 19, height 5\' 7"  (1.702 m), weight 117.255 kg (258 lb 8 oz), SpO2 97.00%. No results found. Results for orders placed during the hospital encounter of 03/03/13 (from the past 72 hour(s))  GLUCOSE, CAPILLARY     Status: Abnormal   Collection Time    03/07/13 11:28 AM      Result Value Range   Glucose-Capillary 321 (*) 70 - 99 mg/dL   Comment 1 Notify RN    GLUCOSE, CAPILLARY     Status: Abnormal   Collection Time    03/07/13  4:23 PM      Result Value Range   Glucose-Capillary 288 (*) 70 - 99 mg/dL   Comment 1 Notify RN    GLUCOSE, CAPILLARY     Status: Abnormal   Collection Time    03/07/13  8:45 PM      Result Value Range   Glucose-Capillary 295 (*) 70 - 99 mg/dL   Comment 1 Notify RN    GLUCOSE, CAPILLARY     Status: Abnormal   Collection Time    03/08/13  7:30 AM      Result Value Range   Glucose-Capillary 279 (*) 70 - 99 mg/dL   Comment 1 Notify RN     GLUCOSE, CAPILLARY     Status: Abnormal   Collection Time    03/08/13 12:12 PM      Result Value Range   Glucose-Capillary 377 (*) 70 - 99 mg/dL  GLUCOSE, CAPILLARY     Status: Abnormal   Collection Time    03/08/13  4:25 PM      Result Value Range   Glucose-Capillary 306 (*) 70 - 99 mg/dL   Comment 1 Notify RN    GLUCOSE, CAPILLARY     Status: Abnormal   Collection Time    03/08/13  8:45 PM      Result Value Range   Glucose-Capillary 199 (*) 70 - 99 mg/dL   Comment 1 Notify RN    GLUCOSE, CAPILLARY     Status: Abnormal   Collection Time    03/09/13  7:37 AM      Result Value Range   Glucose-Capillary 292 (*) 70 - 99 mg/dL   Comment 1 Notify RN    GLUCOSE, CAPILLARY     Status: Abnormal   Collection Time    03/09/13 11:53 AM      Result Value Range   Glucose-Capillary 346 (*) 70 - 99 mg/dL   Comment 1 Notify RN    GLUCOSE, CAPILLARY  Status: Abnormal   Collection Time    03/09/13  4:59 PM      Result Value Range   Glucose-Capillary 296 (*) 70 - 99 mg/dL   Comment 1 Notify RN    GLUCOSE, CAPILLARY     Status: Abnormal   Collection Time    03/09/13  8:44 PM      Result Value Range   Glucose-Capillary 206 (*) 70 - 99 mg/dL  GLUCOSE, CAPILLARY     Status: Abnormal   Collection Time    03/10/13  7:35 AM      Result Value Range   Glucose-Capillary 233 (*) 70 - 99 mg/dL   Comment 1 Notify RN       HEENT: normal. No congestion, drainage Cardio: RRR and no Murmur Resp: CTA B/L and unlabored. No wheezes or rhonchi. Chest clear GI: BS positive and non tender Extremity:  Pulses positive and No Edema Skin:   Intact Neuro: Alert/Oriented, Cranial Nerve Abnormalities Left central 7, , Normal Sensory, Abnormal Motor 2-/5 Left bi, tri, trace grip, trace abd, Abnormal FMC Ataxic/ dec FMC and Dysarthric, MAS 1 at elbow, Left fingers extended in resting hand splint Musc/Skel:  Other no pain with L shoulder ROM Gen NAD, alert, pleasant   Assessment/Plan: 1. Functional  deficits secondary to R MCA embolic infarct  through PFO which require 3+ hours per day of interdisciplinary therapy in a comprehensive inpatient rehab setting. Physiatrist is providing close team supervision and 24 hour management of active medical problems listed below. Physiatrist and rehab team continue to assess barriers to discharge/monitor patient progress toward functional and medical goals. Team conference today please see physician documentation under team conference tab, met with team face-to-face to discuss problems,progress, and goals. Formulized individual treatment plan based on medical history, underlying problem and comorbidities. FIM: FIM - Bathing Bathing Steps Patient Completed: Chest;Left Arm;Abdomen;Front perineal area;Buttocks Bathing: 4: Min-Patient completes 8-9 94f 10 parts or 75+ percent (chose to complete UB bathing and perineal area)  FIM - Upper Body Dressing/Undressing Upper body dressing/undressing steps patient completed: Thread/unthread right sleeve of pullover shirt/dresss;Thread/unthread left sleeve of pullover shirt/dress;Put head through opening of pull over shirt/dress Upper body dressing/undressing: 4: Min-Patient completed 75 plus % of tasks FIM - Lower Body Dressing/Undressing Lower body dressing/undressing steps patient completed: Thread/unthread right underwear leg;Thread/unthread left underwear leg;Thread/unthread right pants leg;Thread/unthread left pants leg;Don/Doff right sock;Don/Doff left sock;Don/Doff right shoe;Don/Doff left shoe Lower body dressing/undressing: 3: Mod-Patient completed 50-74% of tasks  FIM - Toileting Toileting steps completed by patient: Adjust clothing prior to toileting;Performs perineal hygiene;Adjust clothing after toileting Toileting: 4: Steadying assist  FIM - Diplomatic Services operational officer Devices: Grab bars Toilet Transfers: 4-To toilet/BSC: Min A (steadying Pt. > 75%);4-From toilet/BSC: Min A (steadying  Pt. > 75%) (per Loretta Plume, NT)  FIM - Bed/Chair Transfer Bed/Chair Transfer Assistive Devices: Arm rests Bed/Chair Transfer: 3: Supine > Sit: Mod A (lifting assist/Pt. 50-74%/lift 2 legs;4: Bed > Chair or W/C: Min A (steadying Pt. > 75%)  FIM - Locomotion: Wheelchair Distance: 150 Locomotion: Wheelchair: 5: Travels 150 ft or more: maneuvers on rugs and over door sills with supervision, cueing or coaxing FIM - Locomotion: Ambulation Locomotion: Ambulation Assistive Devices: Chief Operating Officer Ambulation/Gait Assistance: 4: Min assist Locomotion: Ambulation: 2: Travels 50 - 149 ft with minimal assistance (Pt.>75%)  Comprehension Comprehension Mode: Auditory Comprehension: 5-Understands complex 90% of the time/Cues < 10% of the time  Expression Expression Mode: Verbal Expression: 5-Expresses complex 90% of the time/cues < 10%  of the time  Social Interaction Social Interaction: 5-Interacts appropriately 90% of the time - Needs monitoring or encouragement for participation or interaction.  Problem Solving Problem Solving: 5-Solves basic problems: With no assist  Memory Memory: 5-Recognizes or recalls 90% of the time/requires cueing < 10% of the time  Medical Problem List and Plan:  1. DVT left mid peroneal vein/Anticoagulation: Pharmaceutical: Xarelto  2. Pain Management: prn tylenol effective for pain.  3. Mood: Has good outlook and aware of time needed for recovery.    4. Neuropsych: This patient is capable of making decisions on her own behalf.  5. DM type 2--insulin dependent:uncontrolled On insulin pump at home--titrate levemir further today for basal insulin and use SSI as needed. Po intake is good.  6. Migraines: resumed Topamax and inderal at lower dose to avoid recurrence. Titrate as needed. --controlled for the most part-  -continue to assess 7. Oral pain: improved 8. Hyperactive airway: resumed Advair   LOS (Days) 7 A FACE TO FACE EVALUATION WAS  PERFORMED  KIRSTEINS,ANDREW E 03/10/2013, 9:45 AM

## 2013-03-10 NOTE — Progress Notes (Signed)
Occupational Therapy Session Note  Patient Details  Name: Patricia Black MRN: [id number] Date of Birth: February 18, 1951  Today's Date: 03/10/2013 Time: 1300-1350 Time Calculation (min): 50 min  Short Term Goals: Week 1:  OT Short Term Goal 1 (Week 1): Pt will perform UB dressing with supervision and mod instructional cueing. OT Short Term Goal 2 (Week 1): Pt will donn pullover underpants and pants with min assist sit to stand. OT Short Term Goal 3 (Week 1): Pt will perform toilet transfers with min assist to elevated toilet/3:1. OT Short Term Goal 4 (Week 1): Pt will use the LUE as a stabilizer with min assist during bathing tasks. OT Short Term Goal 5 (Week 1): Pt will initiate and perform all bathing with no more than min instructional cueing during session.  Skilled Therapeutic Interventions/Progress Updates:    Pt seen for ADL retraining with focus on functional transfers, use of LUE during bathing and dressing tasks, and sequencing of bathing.  Pt ambulated from bed to walk-in shower with hemi-walker with min assist.  Pt completed bathing with use of wash mit on Lt hand when washing RUE, demonstrating improved shoulder flexion and horizontal adduction.  Pt continues to require assistance to pull up and down pants, over Lt hip, secondary to decreased grasp in Lt hand and body habitus.  Pt donned shirt with increased time for hemi-dressing technique and assist to pull down over trunk.  Donned socks this session secondary to decreased time, left in w/c with son present.  Therapy Documentation Precautions:  Precautions Precautions: Fall Precaution Comments: Impulsive, L hemiplegia, possible left visual field deficit Restrictions Weight Bearing Restrictions: No General: General Amount of Missed OT Time (min): 60 Minutes Vital Signs:   Pain: Pain Assessment Pain Assessment: No/denies pain  See FIM for current functional status  Therapy/Group: Individual Therapy  Rosalio Loud 03/10/2013, 2:56 PM

## 2013-03-10 NOTE — Patient Care Conference (Signed)
Inpatient RehabilitationTeam Conference and Plan of Care Update Date: 03/10/2013   Time: 11:45 AM    Patient Name: Patricia Black      Medical Record Number: [id number]  Date of Birth: 09/16/1950 Sex: Female         Room/Bed: 4W22C/4W22C-01 Payor Info: Payor: [employer] / Plan: CIGNA MANAGED / Product Type: *No Product type* /    Admitting Diagnosis: RT CVA  Admit Date/Time:  03/03/2013  8:06 PM Admission Comments: No comment available   Primary Diagnosis:  <principal problem not specified> Principal Problem: <principal problem not specified>  Patient Active Problem List   Diagnosis Date Noted  . PFO (patent foramen ovale) 03/03/2013  . Peroneal DVT (deep venous thrombosis) 03/03/2013  . IMPACT 03/03/2013  . Type II or unspecified type diabetes mellitus without mention of complication, uncontrolled 03/03/2013  . OSA (obstructive sleep apnea) 03/03/2013  . Right shoulder pain 03/03/2013  . CVA (cerebral infarction) 02/24/2013  . CVA (cerebral vascular accident) 02/24/2013  . Hypothermia 02/24/2013  . Hypokalemia 02/24/2013    Expected Discharge Date: Expected Discharge Date: 03/20/13  Team Members Present: Physician leading conference: Dr. Claudette Laws Social Worker Present: Dossie Der, LCSW;Jenny Jamita Mckelvin, LCSW Nurse Present: Carlean Purl, RN PT Present: Harriet Butte, PT OT Present: Bretta Bang, OT;Sarah Hoxie, Heath Lark, OT SLP Present: Fae Pippin, SLP PPS Coordinator present : Tora Duck, RN, CRRN;Becky Henrene Dodge, PT     Current Status/Progress Goal Weekly Team Focus  Medical   UE still very weak  maximize NM re education  OT retraining   Bowel/Bladder   Continent of bowel and bladder  Remain continent of bowel and bladder  Call for assistance to meet toileting needs   Swallow/Nutrition/ Hydration             ADL's   mod assist bathing, mod assist LB dressing, min-mod assist UB dressing, min-mod assist transfers, LUE weakness  supervision overall,  min assist LB dressing  education on hemi-technique, LUE NM Re-ed, transfers   Mobility   Pt currently requires min assist for bed mobility, min to mod assist at times for standing, min/mod assist for gait with hemi walker, min assist for stairs  supervision to mod I overall  NMR for LLE/UE, standing balance, gait training, transfers, bed mobility.    Communication   Supervision-Mod I   Supervision   increase self-monitoring and correcting   Safety/Cognition/ Behavioral Observations  Mod assist with complex  Supervision   increase organization, self-monitoring and correcting   Pain   Tylenol PRN q 4hrs for mild pain  >3 on a scale of 0-10  Assess pain and reassess after pain intervention   Skin   No skin issues at this time  Remain free from skin breakdown and infection while on rehab  Assess skin q shift     Rehab Goals Patient on target to meet rehab goals: Yes Rehab Goals Revised: None *See Care Plan and progress notes for long and short-term goals.  Barriers to Discharge: still needs some assist    Possible Resolutions to Barriers:  cont rehab    Discharge Planning/Teaching Needs:  Pt will be going to her dtr's home in [place] at d/c.  Pt will have 24/7 supervision.  Pt's dtr can attend family education.  She will be back in town 03-14-13.   Team Discussion: From a medical standpoint, pt is doing well with the exception of her blood sugars being a little high.  This will continue to be watched.  Pt is not  sleeping well due to allergies/congestion and pt has new medication ordered to help her sleep better tonight.  She also had some symptoms from restless leg syndrome and it was relieved with robaxin.  She is gets a bit fatigued with therapies, but is doing better with all therapies.    Revisions to Treatment Plan:  None   Continued Need for Acute Rehabilitation Level of Care: The patient requires daily medical management by a physician with specialized training in physical medicine  and rehabilitation for the following conditions: Daily direction of a multidisciplinary physical rehabilitation program to ensure safe treatment while eliciting the highest outcome that is of practical value to the patient.: Yes Daily analysis of laboratory values and/or radiology reports with any subsequent need for medication adjustment of medical intervention for : Neurological problems  Aseret Hoffman, Vista Deck 03/10/2013, 1:25 PM

## 2013-03-10 NOTE — Progress Notes (Signed)
Speech Language Pathology Daily Session Note  Patient Details  Name: Patricia Black MRN: [id number] Date of Birth: 1951/01/12  Today's Date: 03/10/2013 Time: 4098-1191 Time Calculation (min): 30 min  Short Term Goals: Week 1: SLP Short Term Goal 1 (Week 1): Patient will identify 2/3 effective speech intelligibilty strategies with Supervision level question cues SLP Short Term Goal 2 (Week 1): Patient will self-monitor and correct speech intelligibility errors with Min verbal cues SLP Short Term Goal 3 (Week 1): Patient will initiate, sequence and solve moderately complex problems with Min verbal cues  Skilled Therapeutic Interventions: Skilled treatment session focused on addressing cognitive goals. SLP facilitated session with functional problem solving during a scheduling task.  Patient required increased wait time to complete task accurately.  Of note, patient also required Supervision level verbal cues to self-monitor and correct speech intelligibility. Continue plan of care.   FIM:  Comprehension Comprehension Mode: Auditory Comprehension: 6-Follows complex conversation/direction: With extra time/assistive device Expression Expression Mode: Verbal Expression: 5-Expresses complex 90% of the time/cues < 10% of the time Social Interaction Social Interaction: 5-Interacts appropriately 90% of the time - Needs monitoring or encouragement for participation or interaction. Problem Solving Problem Solving: 5-Solves complex 90% of the time/cues < 10% of the time Memory Memory: 5-Recognizes or recalls 90% of the time/requires cueing < 10% of the time  Pain Pain Assessment Pain Assessment: No/denies pain  Therapy/Group: Individual Therapy  [name], CCC-SLP [phone]  Patricia Black 03/10/2013, 1:01 PM

## 2013-03-11 ENCOUNTER — Inpatient Hospital Stay (HOSPITAL_COMMUNITY): Payer: Self-pay | Admitting: Rehabilitation

## 2013-03-11 ENCOUNTER — Inpatient Hospital Stay (HOSPITAL_COMMUNITY): Payer: Managed Care, Other (non HMO) | Admitting: Occupational Therapy

## 2013-03-11 ENCOUNTER — Inpatient Hospital Stay (HOSPITAL_COMMUNITY): Payer: Managed Care, Other (non HMO) | Admitting: Speech Pathology

## 2013-03-11 DIAGNOSIS — G8110 Spastic hemiplegia affecting unspecified side: Secondary | ICD-10-CM

## 2013-03-11 DIAGNOSIS — I6330 Cerebral infarction due to thrombosis of unspecified cerebral artery: Secondary | ICD-10-CM

## 2013-03-11 DIAGNOSIS — E1165 Type 2 diabetes mellitus with hyperglycemia: Secondary | ICD-10-CM

## 2013-03-11 LAB — GLUCOSE, CAPILLARY
Glucose-Capillary: 248 mg/dL — ABNORMAL HIGH (ref 70–99)
Glucose-Capillary: 296 mg/dL — ABNORMAL HIGH (ref 70–99)
Glucose-Capillary: 284 mg/dL — ABNORMAL HIGH (ref 70–99)

## 2013-03-11 MED ORDER — INSULIN DETEMIR 100 UNIT/ML ~~LOC~~ SOLN
30.0000 [IU] | Freq: Every day | SUBCUTANEOUS | Status: DC
  Administered 2013-03-11 – 2013-03-14 (×4): 30 [IU] via SUBCUTANEOUS
  Filled 2013-03-11 (×5): qty 0.3

## 2013-03-11 NOTE — Progress Notes (Signed)
Physical Therapy Session Note  Patient Details  Name: Patricia Black MRN: [id number] Date of Birth: 1950/05/11  Today's Date: 03/11/2013 Time: 1415-1500 Time Calculation (min): 45 min  Short Term Goals: Week 1:  PT Short Term Goal 1 (Week 1): Pt will be supervision with Transfers (bed<>chair) PT Short Term Goal 1 - Progress (Week 1): Progressing toward goal PT Short Term Goal 2 (Week 1): Pt will be able to ambulate 100 feet using LRAD at min A and min cues.  PT Short Term Goal 2 - Progress (Week 1): Progressing toward goal (has not met distance) PT Short Term Goal 3 (Week 1): Pt will be able to negotiate up/down 8 steps using a right railing with min A and min cues.  PT Short Term Goal 3 - Progress (Week 1): Progressing toward goal  Skilled Therapeutic Interventions/Progress Updates:   Pt received sitting in w/c in room with son present (did not attend session).  Performed w/c mobility >150' to/from gym with BLEs in order to address increased step length on LLE (to carryover to gait), increased hamstring strength, and address heel to toe contact to also carryover to gait.  Note some mild L inattention during w/c mobility with cues to correct.  Session focused on gait with hemi walker with heel lift in L shoe and also applied small piece of yellow resistance foam to medial side of shoe for increased medial support and decrease inversion during L WB.  Performed gait training x 100' x 2 reps in controlled environment at min assist with tactile cues for upright posture, manual facilitation for increased weight shift L and tactile cues for increased glute activation.  Progressed to performing obstacle course negotiating around cones and over small poles x 20' x 1 rep at min assist with max cues for correct walker placement and safety.  Pt propelled back to room as mentioned above.  Pt transferred back to bed via stand pivot transfer at min assist with cues for safety and scooting to edge of chair prior  to standing.  Left in bed with bed alarm set and all needs in reach.   Therapy Documentation Precautions:  Precautions Precautions: Fall Precaution Comments: Impulsive, L hemiplegia, possible left visual field deficit Restrictions Weight Bearing Restrictions: No   Vital Signs: Therapy Vitals Temp: 97.6 F (36.4 C) Temp src: Oral Pulse Rate: 66 Resp: 20 BP: 138/43 mmHg Patient Position, if appropriate: Sitting Oxygen Therapy SpO2: 99 % O2 Device: None (Room air) Pain: Pain Assessment Pain Assessment: 0-10 Pain Score: 7  Pain Type: Acute pain Pain Location: Shoulder Pain Orientation: Right Pain Descriptors / Indicators: Aching Pain Frequency: Rarely Pain Onset: Sudden Patients Stated Pain Goal: 3 Pain Intervention(s): Medication (See eMAR);Repositioned;Emotional support Multiple Pain Sites: No   Locomotion : Ambulation Ambulation/Gait Assistance: 4: Min assist   See FIM for current functional status  Therapy/Group: Individual Therapy  Vista Deck 03/11/2013, 4:12 PM

## 2013-03-11 NOTE — Progress Notes (Signed)
Physical Therapy Session Note  Patient Details  Name: Patricia Black MRN: [id number] Date of Birth: 1950-04-10  Today's Date: 03/11/2013 Time: 4098-1191 Time Calculation (min): 44 min  Short Term Goals: Week 1:  PT Short Term Goal 1 (Week 1): Pt will be supervision with Transfers (bed<>chair) PT Short Term Goal 1 - Progress (Week 1): Progressing toward goal PT Short Term Goal 2 (Week 1): Pt will be able to ambulate 100 feet using LRAD at min A and min cues.  PT Short Term Goal 2 - Progress (Week 1): Progressing toward goal (has not met distance) PT Short Term Goal 3 (Week 1): Pt will be able to negotiate up/down 8 steps using a right railing with min A and min cues.  PT Short Term Goal 3 - Progress (Week 1): Progressing toward goal  Skilled Therapeutic Interventions/Progress Updates:   Pt received lying in bed this morning and agreeable to therapy.  Performed supine >sit at min assist due to increased difficulty using LUE to self assist and body habitus preventing RUE from fully assisting.  Performed several reps of stand pivot transfers R and L at min assist with min cues for scooting to edge of chair prior to standing and also ensuring proper foot placement prior to standing.  Focus of session was sit <> stand in order to increase WB through LEs (see details below) and also gait training with heel lift to decrease genu recurvatum.  See details below.  Pt assisted back to room and left in w/c with all needs in reach and quick release belt donned.    Therapy Documentation Precautions:  Precautions Precautions: Fall Precaution Comments: Impulsive, L hemiplegia, possible left visual field deficit Restrictions Weight Bearing Restrictions: No   Pain: Pain Assessment Pain Assessment: No/denies pain Pain Score: 0-[name]   Locomotion : Ambulation Ambulation: Yes Ambulation/Gait Assistance: 4: Min assist Ambulation Distance (Feet): 90 Feet (and another 25') Assistive device:  Hemi-walker Ambulation/Gait Assistance Details: Verbal cues for sequencing;Verbal cues for precautions/safety;Verbal cues for technique;Verbal cues for gait pattern;Manual facilitation for weight shifting;Verbal cues for safe use of DME/AE;Manual facilitation for weight bearing Ambulation/Gait Assistance Details: Initiated gait training today with heel lift in L shoe in order to increase PF and therefore assist with decreased L knee hyperextension during L stance phase.  Performed 90' x 1 with min assist and facilitation for increased weight shift L and tapping to facilitate increased glute activation in LLE.  Note that she tends to invert L foot during WB, therefore discussed having increased heel width and also increased medial support to prevent inversion.  Will trial new pair of shoes during tomorrows session.  Gait Gait: Yes Gait Pattern: Impaired Gait Pattern: Decreased step length - left;Decreased dorsiflexion - left;Decreased weight shift to right;Decreased stance time - left;Step-to pattern;Decreased weight shift to left;Trunk flexed;Narrow base of support;Trendelenburg (L foot inversion with WB)     Other Treatments: Treatments Therapeutic Activity: Performed 5 reps of sit <> stand in order to increase NMR to LLE by increasing WB through LLE and also to increase quad control during standing and controlled descent.  Requires min/guard assist initially with use of R arm rest, however on last three reps required mod assist due to fatigue and had pt use hands on knees to stand for increased activation of quads and glutes.   Neuromuscular Facilitation: Left;Lower Extremity;Forced use;Activity to increase motor control;Activity to increase grading;Activity to increase sustained activation;Activity to increase anterior-posterior weight shifting  See FIM for current functional status  Therapy/Group:  Individual Therapy  Vista Deck 03/11/2013, 11:25 AM

## 2013-03-11 NOTE — Progress Notes (Signed)
Occupational Therapy Session Note  Patient Details  Name: Patricia Black MRN: [id number] Date of Birth: [birth year]  Today's Date: 03/11/2013 Time: 7829-5621 Time Calculation (min): 60 min  Short Term Goals: Week 1:  OT Short Term Goal 1 (Week 1): Pt will perform UB dressing with supervision and mod instructional cueing. OT Short Term Goal 2 (Week 1): Pt will donn pullover underpants and pants with min assist sit to stand. OT Short Term Goal 3 (Week 1): Pt will perform toilet transfers with min assist to elevated toilet/3:1. OT Short Term Goal 4 (Week 1): Pt will use the LUE as a stabilizer with min assist during bathing tasks. OT Short Term Goal 5 (Week 1): Pt will initiate and perform all bathing with no more than min instructional cueing during session.  Skilled Therapeutic Interventions/Progress Updates:    Pt seen for ADL retraining with focus on hemi-technique with self-care tasks and increased independence with LB dressing. Pt requested to wash up at sink this session with only completing UB bathing and perineal area. Pt attempted to wash RUE with Lt hand by wrapping wash cloth around hand, however due to decreased grasp and shoulder movement required hand over hand assist to improve success with washing. Pt with increased shoulder activation this session, however continues to require hand over hand assist to increase positioning. Pt donned shirt this session with cues to gather Lt sleeve to assist in success with donning Lt sleeve, pt with motor planning difficulties noted with donning shirt this session as shirt inside out and pt unable to correct. Engaged in NM re-ed with use of Bioness NMES utilizing exercise mode to focus on finger flexion/extension in grasp pattern.  Pt required assist to increase positioning to allow for proper electrical stimulation.  Pt reports pleasure in seeing hand and fingers move and no adverse reactions to stimuli.  Therapy Documentation Precautions:   Precautions Precautions: Fall Precaution Comments: Impulsive, L hemiplegia, possible left visual field deficit Restrictions Weight Bearing Restrictions: [name]:  Pt with no c/o pain this session.  See FIM for current functional status  Therapy/Group: Individual Therapy  Rosalio Loud 03/11/2013, 10:29 AM

## 2013-03-11 NOTE — Progress Notes (Signed)
Speech Language Pathology Weekly Progress Note & Daily Session Note  Patient Details  Name: Patricia Black MRN: [id number] Date of Birth: 08/03/1950  Today's Date: 03/11/2013 Time: 4098-1191 Time Calculation (min): 45 min  Short Term Goals: Week 1: SLP Short Term Goal 1 (Week 1): Patient will identify 2/3 effective speech intelligibilty strategies with Supervision level question cues SLP Short Term Goal 1 - Progress (Week 1): Met SLP Short Term Goal 2 (Week 1): Patient will self-monitor and correct speech intelligibility errors with Min verbal cues SLP Short Term Goal 2 - Progress (Week 1): Met SLP Short Term Goal 3 (Week 1): Patient will initiate, sequence and solve moderately complex problems with Min verbal cues SLP Short Term Goal 3 - Progress (Week 1): Met Week 2: SLP Short Term Goal 1 (Week 2): Patient will initiate, sequence and solve moderately complex problems with Supervision level verbal cues SLP Short Term Goal 2 (Week 2): Patient will self-monitor and correct errors with Min question cues  Weekly Progress Updates: Patient met 3 out of 3 short term goals this reporting period due to gains in ability to recall and perform oral motor exercises as well as ability to recall and utilize external speech intelligibility strategies.  Patient's overall intelligibly is currently Mod I and as a result, this next reporting period will address high level cognition to maximize functional independence prior to discharge home with daughter.    SLP Intensity: Minumum of 1-2 x/day, 30 to 90 minutes SLP Frequency: 5 out of 7 days SLP Duration/Estimated Length of Stay: 12/20 SLP Treatment/Interventions: Cognitive remediation/compensation;Cueing hierarchy;Environmental controls;Functional tasks;Internal/external aids;Medication managment;Oral motor exercises;Patient/family education;Speech/Language facilitation;Therapeutic Activities  Daily Session Skilled Intervention: Skilled treatment session  focused on addressing cognitive goals. SLP facilitated session with a new learning task that required patient to alternate attention between various task rules; patient required Supervision level verbal cues to utilize external aids to recall task rules and Min cues to sequence, organize, self-monitor and correct errors.  SLP attempted conversation during task, but patient was unable to divide attention.  Patient's overall ability to recall and utilize oral motor exercises and intelligibly strategies was Mod I.    FIM:  Comprehension Comprehension Mode: Auditory Comprehension: 5-Understands complex 90% of the time/Cues < 10% of the time Expression Expression Mode: Verbal Expression: 6-Expresses complex ideas: With extra time/assistive device Social Interaction Social Interaction: 5-Interacts appropriately 90% of the time - Needs monitoring or encouragement for participation or interaction. Problem Solving Problem Solving: 5-Solves complex 90% of the time/cues < 10% of the time Memory Memory: 5-Recognizes or recalls 90% of the time/requires cueing < 10% of the time General    Pain Pain Assessment Pain Assessment: No/denies pain  Therapy/Group: Individual Therapy  [name], CCC-SLP [phone]  Patricia Black 03/11/2013, 12:25 PM

## 2013-03-11 NOTE — Progress Notes (Signed)
Physical Therapy Weekly Progress Note  Patient Details  Name: Patricia Black MRN: [id number] Date of Birth: 30-Dec-1950  Today's Date: 03/11/2013    Patient has met 0 of 3 short term goals.  Pt making very good progress towards STGs, however requires min assist for stand pivot transfers (esp when more fatigued), requires min assist for ambulation with hemi walker, however is unable to ambulate 100' due to increased fatigue, and is able to perform 5 steps with R handrail at min assist.  She continues to progress well with mobility, however continue to note decreased quad control in standing and ambulation and demonstrates knee hyperextension during L stance.  Also note that she has decreased glute activation during L stance as well.  Continue to work on strengthening exercises and NMR to LLE during sessions.   Patient continues to demonstrate the following deficits: decreased strength in LLE, decreased functional use of LUE, decreased static and dynamic standing balance, decreased WB through LLE, decreased safety awareness and gait abnormality and therefore will continue to benefit from skilled PT intervention to enhance overall performance with activity tolerance, balance, ability to compensate for deficits, functional use of  left upper extremity and left lower extremity, attention, awareness, coordination and knowledge of precautions.  Patient progressing toward long term goals..  Continue plan of care.  PT Short Term Goals Week 1:  PT Short Term Goal 1 (Week 1): Pt will be supervision with Transfers (bed<>chair) PT Short Term Goal 1 - Progress (Week 1): Progressing toward goal PT Short Term Goal 2 (Week 1): Pt will be able to ambulate 100 feet using LRAD at min A and min cues.  PT Short Term Goal 2 - Progress (Week 1): Progressing toward goal (has not met distance) PT Short Term Goal 3 (Week 1): Pt will be able to negotiate up/down 8 steps using a right railing with min A and min cues.  PT  Short Term Goal 3 - Progress (Week 1): Progressing toward goal Week 2:  PT Short Term Goal 1 (Week 2): Pt will be supervision with Transfers (bed<>chair) and demonstrating safe technique.  PT Short Term Goal 2 (Week 2): Pt will be able to ambulate 100 feet using LRAD at min A and min cues with decreased knee hyperextension.  PT Short Term Goal 3 (Week 2): Pt will be able to negotiate up/down 8 steps using a right railing with min A and min cues.  PT Short Term Goal 4 (Week 2): Pt will maintain dynamic standing balance at min assist during sessions.   Skilled Therapeutic Interventions/Progress Updates:   See previous notes.   Therapy Documentation Precautions:  Precautions Precautions: Fall Precaution Comments: Impulsive, L hemiplegia, possible left visual field deficit Restrictions Weight Bearing Restrictions: No   Vital Signs: Therapy Vitals Temp: 97 F (36.1 C) Temp src: Oral Pulse Rate: 82 Resp: 18 BP: 122/61 mmHg Patient Position, if appropriate: Lying Oxygen Therapy SpO2: 95 % O2 Device: None (Room air) See FIM for current functional status   Vista Deck 03/11/2013, 8:27 AM

## 2013-03-11 NOTE — Progress Notes (Signed)
62 y.o. right handed female history of migraine headaches as well as diabetes mellitus (insulin pump) who was admitted 02/24/2013 with left-sided weakness,dizziness as well as fall on her back porch-- found by [facility] when family was unable to get in touch with her. 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 MRI /MRA brain showed acute infarcts in the right basal ganglia and posterior right MCA territory with associated edema and occluded R-MCA beyond origin. Echocardiogram with ejection fraction 70% grade 2 diastolic dysfunction. [name]. Neurology recommended ASA for embolic infarct due to occlusion of R-MCA artery and TEE ordered for workup. TEE revealed PFO and BLE dopplers were positive for L-mid peroneal vein DVT. On Xarelto  Subjective/Complaints: No shoulder or hand pain No other c/os Review of Systems - Negative except weakness Left arm   Objective: Vital Signs: Blood pressure 122/61, pulse 82, temperature 97 F (36.1 C), temperature source Oral, resp. rate 18, height 5\' 7"  (1.702 m), weight 115.7 kg (255 lb 1.2 oz), SpO2 95.00%. No results found. Results for orders placed during the hospital encounter of 03/03/13 (from the past 72 hour(s))  GLUCOSE, CAPILLARY     Status: Abnormal   Collection Time    03/08/13 12:12 PM      Result Value Range   Glucose-Capillary 377 (*) 70 - 99 mg/dL  GLUCOSE, CAPILLARY     Status: Abnormal   Collection Time    03/08/13  4:25 PM      Result Value Range   Glucose-Capillary 306 (*) 70 - 99 mg/dL   Comment 1 Notify RN    GLUCOSE, CAPILLARY     Status: Abnormal   Collection Time    03/08/13  8:45 PM      Result Value Range   Glucose-Capillary 199 (*) 70 - 99 mg/dL   Comment 1 Notify RN    GLUCOSE, CAPILLARY     Status: Abnormal   Collection Time    03/09/13  7:37 AM      Result Value Range   Glucose-Capillary 292 (*) 70 - 99 mg/dL   Comment 1 Notify RN    GLUCOSE,  CAPILLARY     Status: Abnormal   Collection Time    03/09/13 11:53 AM      Result Value Range   Glucose-Capillary 346 (*) 70 - 99 mg/dL   Comment 1 Notify RN    GLUCOSE, CAPILLARY     Status: Abnormal   Collection Time    03/09/13  4:59 PM      Result Value Range   Glucose-Capillary 296 (*) 70 - 99 mg/dL   Comment 1 Notify RN    GLUCOSE, CAPILLARY     Status: Abnormal   Collection Time    03/09/13  8:44 PM      Result Value Range   Glucose-Capillary 206 (*) 70 - 99 mg/dL  GLUCOSE, CAPILLARY     Status: Abnormal   Collection Time    03/10/13  7:35 AM      Result Value Range   Glucose-Capillary 233 (*) 70 - 99 mg/dL   Comment 1 Notify RN    GLUCOSE, CAPILLARY     Status: Abnormal   Collection Time    03/10/13 11:23 AM      Result Value Range   Glucose-Capillary 264 (*) 70 - 99 mg/dL   Comment 1 Notify RN    GLUCOSE, CAPILLARY     Status: Abnormal  Collection Time    03/10/13  4:29 PM      Result Value Range   Glucose-Capillary 241 (*) 70 - 99 mg/dL   Comment 1 Notify RN    GLUCOSE, CAPILLARY     Status: Abnormal   Collection Time    03/10/13  8:40 PM      Result Value Range   Glucose-Capillary 273 (*) 70 - 99 mg/dL  GLUCOSE, CAPILLARY     Status: Abnormal   Collection Time    03/11/13  7:22 AM      Result Value Range   Glucose-Capillary 217 (*) 70 - 99 mg/dL   Comment 1 Notify RN       HEENT: normal. No congestion, drainage Cardio: RRR and no Murmur Resp: CTA B/L and unlabored. No wheezes or rhonchi. Chest clear GI: BS positive and non tender Extremity:  Pulses positive and No Edema Skin:   Intact Neuro: Alert/Oriented, Cranial Nerve Abnormalities Left central 7, , Normal Sensory, Abnormal Motor 2-/5 Left bi, tri, trace grip, trace abd, Abnormal FMC Ataxic/ dec FMC and Dysarthric, MAS 1 at elbow, Left fingers 2 Musc/Skel:  Other no pain with L shoulder ROM Gen NAD, alert, pleasant   Assessment/Plan: 1. Functional deficits secondary to R MCA embolic infarct   through PFO which require 3+ hours per day of interdisciplinary therapy in a comprehensive inpatient rehab setting. Physiatrist is providing close team supervision and 24 hour management of active medical problems listed below. Physiatrist and rehab team continue to assess barriers to discharge/monitor patient progress toward functional and medical goals.  FIM: FIM - Bathing Bathing Steps Patient Completed: Chest;Left Arm;Abdomen;Front perineal area;Buttocks;Right upper leg;Left upper leg;Right lower leg (including foot);Left lower leg (including foot) Bathing: 4: Min-Patient completes 8-9 69f 10 parts or 75+ percent  FIM - Upper Body Dressing/Undressing Upper body dressing/undressing steps patient completed: Thread/unthread right sleeve of pullover shirt/dresss;Thread/unthread left sleeve of pullover shirt/dress;Put head through opening of pull over shirt/dress Upper body dressing/undressing: 4: Min-Patient completed 75 plus % of tasks FIM - Lower Body Dressing/Undressing Lower body dressing/undressing steps patient completed: Thread/unthread right underwear leg;Thread/unthread left underwear leg;Thread/unthread right pants leg;Thread/unthread left pants leg;Don/Doff right sock;Don/Doff left sock Lower body dressing/undressing: 3: Mod-Patient completed 50-74% of tasks  FIM - Toileting Toileting steps completed by patient: Adjust clothing prior to toileting;Performs perineal hygiene;Adjust clothing after toileting Toileting Assistive Devices: Grab bar or rail for support Toileting: 4: Steadying assist  FIM - Diplomatic Services operational officer Devices: Grab bars Toilet Transfers: 4-To toilet/BSC: Min A (steadying Pt. > 75%);4-From toilet/BSC: Min A (steadying Pt. > 75%) (per Loretta Plume, NT)  FIM - Bed/Chair Transfer Bed/Chair Transfer Assistive Devices: Arm rests Bed/Chair Transfer: 4: Supine > Sit: Min A (steadying Pt. > 75%/lift 1 leg);4: Bed > Chair or W/C: Min A (steadying Pt.  > 75%)  FIM - Locomotion: Wheelchair Distance: 150 Locomotion: Wheelchair: 5: Travels 150 ft or more: maneuvers on rugs and over door sills with supervision, cueing or coaxing FIM - Locomotion: Ambulation Locomotion: Ambulation Assistive Devices: Chief Operating Officer Ambulation/Gait Assistance: 4: Min assist Locomotion: Ambulation: 2: Travels 50 - 149 ft with minimal assistance (Pt.>75%)  Comprehension Comprehension Mode: Auditory Comprehension: 6-Follows complex conversation/direction: With extra time/assistive device  Expression Expression Mode: Verbal Expression: 5-Expresses complex 90% of the time/cues < 10% of the time  Social Interaction Social Interaction: 5-Interacts appropriately 90% of the time - Needs monitoring or encouragement for participation or interaction.  Problem Solving Problem Solving: 5-Solves complex 90% of the  time/cues < 10% of the time  Memory Memory: 5-Recognizes or recalls 90% of the time/requires cueing < 10% of the time  Medical Problem List and Plan:  1. DVT left mid peroneal vein/Anticoagulation: Pharmaceutical: Xarelto  2. Pain Management: prn tylenol effective for pain.  3. Mood: Has good outlook and aware of time needed for recovery.   Discussed D/C date "sooner than I thought" but pleased 4. Neuropsych: This patient is capable of making decisions on her own behalf.  5. DM type 2--insulin dependent:uncontrolled On insulin pump at home--titrate levemir further today for basal insulin and use SSI as needed. Po intake is good.  6. Migraines: resumed Topamax and inderal at lower dose to avoid recurrence. Titrate as needed. --controlled for the most part-  -continue to assess 7. Oral pain: improved 8. Hyperactive airway: resumed Advair 9.  Spasticity LUE flexors  LOS (Days) 8 A FACE TO FACE EVALUATION WAS PERFORMED  Caoilainn Sacks E 03/11/2013, 7:43 AM

## 2013-03-11 NOTE — Progress Notes (Signed)
Patient refused CPAP for the night  

## 2013-03-12 ENCOUNTER — Inpatient Hospital Stay (HOSPITAL_COMMUNITY): Payer: Self-pay | Admitting: Occupational Therapy

## 2013-03-12 ENCOUNTER — Inpatient Hospital Stay (HOSPITAL_COMMUNITY): Payer: Self-pay | Admitting: Rehabilitation

## 2013-03-12 ENCOUNTER — Inpatient Hospital Stay (HOSPITAL_COMMUNITY): Payer: Managed Care, Other (non HMO) | Admitting: Speech Pathology

## 2013-03-12 ENCOUNTER — Inpatient Hospital Stay (HOSPITAL_COMMUNITY): Payer: Managed Care, Other (non HMO) | Admitting: *Deleted

## 2013-03-12 LAB — GLUCOSE, CAPILLARY
Glucose-Capillary: 213 mg/dL — ABNORMAL HIGH (ref 70–99)
Glucose-Capillary: 171 mg/dL — ABNORMAL HIGH (ref 70–99)

## 2013-03-12 NOTE — Progress Notes (Signed)
62 y.o. right handed female history of migraine headaches as well as diabetes mellitus (insulin pump) who was admitted 02/24/2013 with left-sided weakness,dizziness as well as fall on her back porch-- found by [facility] when family was unable to get in touch with her. 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 MRI /MRA brain showed acute infarcts in the right basal ganglia and posterior right MCA territory with associated edema and occluded R-MCA beyond origin. Echocardiogram with ejection fraction 70% grade 2 diastolic dysfunction. [name]. Neurology recommended ASA for embolic infarct due to occlusion of R-MCA artery and TEE ordered for workup. TEE revealed PFO and BLE dopplers were positive for L-mid peroneal vein DVT. On Xarelto  Subjective/Complaints: No shoulder or hand pain No other c/os Review of Systems - Negative except weakness Left arm   Objective: Vital Signs: Blood pressure 136/76, pulse 64, temperature 97.8 F (36.6 C), temperature source Oral, resp. rate 18, height 5\' 7"  (1.702 m), weight 117.9 kg (259 lb 14.8 oz), SpO2 94.00%. No results found. Results for orders placed during the hospital encounter of 03/03/13 (from the past 72 hour(s))  GLUCOSE, CAPILLARY     Status: Abnormal   Collection Time    03/09/13 11:53 AM      Result Value Range   Glucose-Capillary 346 (*) 70 - 99 mg/dL   Comment 1 Notify RN    GLUCOSE, CAPILLARY     Status: Abnormal   Collection Time    03/09/13  4:59 PM      Result Value Range   Glucose-Capillary 296 (*) 70 - 99 mg/dL   Comment 1 Notify RN    GLUCOSE, CAPILLARY     Status: Abnormal   Collection Time    03/09/13  8:44 PM      Result Value Range   Glucose-Capillary 206 (*) 70 - 99 mg/dL  GLUCOSE, CAPILLARY     Status: Abnormal   Collection Time    03/10/13  7:35 AM      Result Value Range   Glucose-Capillary 233 (*) 70 - 99 mg/dL   Comment 1 Notify RN    GLUCOSE,  CAPILLARY     Status: Abnormal   Collection Time    03/10/13 11:23 AM      Result Value Range   Glucose-Capillary 264 (*) 70 - 99 mg/dL   Comment 1 Notify RN    GLUCOSE, CAPILLARY     Status: Abnormal   Collection Time    03/10/13  4:29 PM      Result Value Range   Glucose-Capillary 241 (*) 70 - 99 mg/dL   Comment 1 Notify RN    GLUCOSE, CAPILLARY     Status: Abnormal   Collection Time    03/10/13  8:40 PM      Result Value Range   Glucose-Capillary 273 (*) 70 - 99 mg/dL  GLUCOSE, CAPILLARY     Status: Abnormal   Collection Time    03/11/13  7:22 AM      Result Value Range   Glucose-Capillary 217 (*) 70 - 99 mg/dL   Comment 1 Notify RN    GLUCOSE, CAPILLARY     Status: Abnormal   Collection Time    03/11/13 11:44 AM      Result Value Range   Glucose-Capillary 248 (*) 70 - 99 mg/dL   Comment 1 Notify RN    GLUCOSE, CAPILLARY     Status: Abnormal  Collection Time    03/11/13  5:00 PM      Result Value Range   Glucose-Capillary 296 (*) 70 - 99 mg/dL   Comment 1 Notify RN    GLUCOSE, CAPILLARY     Status: Abnormal   Collection Time    03/11/13  8:34 PM      Result Value Range   Glucose-Capillary 284 (*) 70 - 99 mg/dL     HEENT: normal. No congestion, drainage Cardio: RRR and no Murmur Resp: CTA B/L and unlabored. No wheezes or rhonchi. Chest clear GI: BS positive and non tender Extremity:  Pulses positive and No Edema Skin:   Intact Neuro: Alert/Oriented, Cranial Nerve Abnormalities Left central 7, , Normal Sensory, Abnormal Motor 2-/5 Left bi, tri, trace grip, trace abd, Abnormal FMC Ataxic/ dec FMC and Dysarthric, MAS 1 at elbow, Left fingers 2 Musc/Skel:  Other no pain with L shoulder ROM Gen NAD, alert, pleasant   Assessment/Plan: 1. Functional deficits secondary to R MCA embolic infarct  through PFO which require 3+ hours per day of interdisciplinary therapy in a comprehensive inpatient rehab setting. Physiatrist is providing close team supervision and 24 hour  management of active medical problems listed below. Physiatrist and rehab team continue to assess barriers to discharge/monitor patient progress toward functional and medical goals.  FIM: FIM - Bathing Bathing Steps Patient Completed: Chest;Left Arm;Abdomen;Front perineal area;Buttocks;Right upper leg;Left upper leg Bathing: 4: Min-Patient completes 8-9 7f 10 parts or 75+ percent  FIM - Upper Body Dressing/Undressing Upper body dressing/undressing steps patient completed: Thread/unthread right sleeve of pullover shirt/dresss;Put head through opening of pull over shirt/dress;Pull shirt over trunk Upper body dressing/undressing: 4: Min-Patient completed 75 plus % of tasks FIM - Lower Body Dressing/Undressing Lower body dressing/undressing steps patient completed: Thread/unthread right underwear leg;Thread/unthread left underwear leg;Thread/unthread right pants leg;Thread/unthread left pants leg;Don/Doff right sock;Don/Doff left sock Lower body dressing/undressing: 3: Mod-Patient completed 50-74% of tasks  FIM - Toileting Toileting steps completed by patient: Adjust clothing prior to toileting;Performs perineal hygiene;Adjust clothing after toileting Toileting Assistive Devices: Grab bar or rail for support Toileting: 4: Steadying assist  FIM - Diplomatic Services operational officer Devices: Grab bars Toilet Transfers: 4-To toilet/BSC: Min A (steadying Pt. > 75%);4-From toilet/BSC: Min A (steadying Pt. > 75%) (per Loretta Plume, NT)  FIM - Bed/Chair Transfer Bed/Chair Transfer Assistive Devices: Arm rests Bed/Chair Transfer: 4: Supine > Sit: Min A (steadying Pt. > 75%/lift 1 leg);4: Bed > Chair or W/C: Min A (steadying Pt. > 75%)  FIM - Locomotion: Wheelchair Distance: 150 Locomotion: Wheelchair: 5: Travels 150 ft or more: maneuvers on rugs and over door sills with supervision, cueing or coaxing FIM - Locomotion: Ambulation Locomotion: Ambulation Assistive Devices: Scientist, research (physical sciences) Ambulation/Gait Assistance: 4: Min assist Locomotion: Ambulation: 2: Travels 50 - 149 ft with minimal assistance (Pt.>75%)  Comprehension Comprehension Mode: Auditory Comprehension: 5-Understands complex 90% of the time/Cues < 10% of the time  Expression Expression Mode: Verbal Expression: 6-Expresses complex ideas: With extra time/assistive device  Social Interaction Social Interaction: 5-Interacts appropriately 90% of the time - Needs monitoring or encouragement for participation or interaction.  Problem Solving Problem Solving: 5-Solves complex 90% of the time/cues < 10% of the time  Memory Memory: 5-Recognizes or recalls 90% of the time/requires cueing < 10% of the time  Medical Problem List and Plan:  1. DVT left mid peroneal vein/Anticoagulation: Pharmaceutical: Xarelto  2. Pain Management: prn tylenol effective for pain.  3. Mood: Has good outlook and aware of  time needed for recovery.   Discussed D/C date "sooner than I thought" but pleased 4. Neuropsych: This patient is capable of making decisions on her own behalf.  5. DM type 2--insulin dependent:uncontrolled On insulin pump at home--titrate levemir further today for basal insulin and use SSI as needed. Po intake is good.  6. Migraines: resumed Topamax and inderal at lower dose to avoid recurrence. Titrate as needed. --controlled for the most part-  -continue to assess 7. Oral pain: improved 8. Hyperactive airway: resumed Advair 9.  Spasticity LUE flexors, hand splint no meds needed thus far  LOS (Days) 9 A FACE TO FACE EVALUATION WAS PERFORMED  Zalia Hautala E 03/12/2013, 7:41 AM

## 2013-03-12 NOTE — Progress Notes (Signed)
Occupational Therapy Session Note  Patient Details  Name: Patricia Black MRN: [id number] Date of Birth: 1950-05-29  Today's Date: 03/12/2013 Time: 1000-1040 Time Calculation (min): 40 min   Skilled Therapeutic Interventions/Progress Updates:    Patient see this am for 1:1 OT session to address neuromuscular re-education to right upper extremity.  Following dynamic weight bearing with weight shifted onto right hip, and extended right arm, patient demonstrated increased muscle tension throughout right upper extremity.  Patient demonstrating mass finger flexion, and lateral  pinch with thumb and index finger (not yet sufficient strength for function.)   In supine, patient able to isolate elbow flexion and extension through full range, shoulder flexion with elbow extension, as needed for reach.  She also demonstrates shoulder add/abduction, extension.flexion, internal and external rotation.  Patient very excited to see upper extremity movement.  Encouraged patient to reach back (shoulder extension) to locate arm rest on right with right arm to lower self from standing to sitting.    Therapy Documentation Precautions:  Precautions Precautions: Fall Precaution Comments: Impulsive, L hemiplegia, possible left visual field deficit Restrictions Weight Bearing Restrictions: No General:   Vital Signs: Oxygen Therapy O2 Device: None (Room air) Pain:  Patient reports no pain  See FIM for current functional status  Therapy/Group: Individual Therapy  Collier Salina 03/12/2013, 11:16 AM

## 2013-03-12 NOTE — Progress Notes (Signed)
Social Work Patient ID: Theron Arista, female   DOB: 14-Jun-1950, 62 y.o.   MRN: [id number]  CSW met with pt and her son 03-10-13 to update them on team conference discussion.  Pt is progressing with therapies, however she does fatigue with PT and has some knee hyperextension. Pt is able to do more of her upper body activities and needs mod assist with her lower body.  Pt's speech has improved.  Pt agreed with all of the therapists' reports and she is encouraged to see her own progress.  Informed pt and son of d/c date of 03-20-13 and pt was pleased.  Dr. Wynn Banker had already informed them of this.  CSW also called and spoke with pt's dtr to update her on the above.  Pt's dtr and CSW had spoken on 03-09-13 about CSW's concern about pt's mood and dtr had agreed she seemed a bit down.  CSW was recommending a neuropsychology visit.  After seeing pt again on 03-10-13, pt was much brighter and seemed in better spirits.  Dtr agreed and reported pt was very tired on 03-09-13.  We decided to hold off on neuropsych referral at this point, but CSW explained to dtr and pt that CSW will keep assessing pt's mood.  They were accepting of this.

## 2013-03-12 NOTE — Progress Notes (Signed)
Physical Therapy Session Note  Patient Details  Name: Patricia Black MRN: [id number] Date of Birth: 1951/01/04  Today's Date: 03/12/2013 Time: 1431-1500 Time Calculation (min): 29 min  Short Term Goals: Week 2:  PT Short Term Goal 1 (Week 2): Pt will be supervision with Transfers (bed<>chair) and demonstrating safe technique.  PT Short Term Goal 2 (Week 2): Pt will be able to ambulate 100 feet using LRAD at min A and min cues with decreased knee hyperextension.  PT Short Term Goal 3 (Week 2): Pt will be able to negotiate up/down 8 steps using a right railing with min A and min cues.  PT Short Term Goal 4 (Week 2): Pt will maintain dynamic standing balance at min assist during sessions.   Skilled Therapeutic Interventions/Progress Updates:   Pt received sitting in recliner, agreeable to therapy this afternoon.  Performed stand step transfer to w/c using LBQC at min/guard assist with continued cues for scooting to edge of chair and increased forward weight shift when standing.  Also continue to note some motor planning delay when performing tasks during session.  Assisted to gym via w/c to perform standing activity tapping to targets on level surface and up to 3" step to work on increased WB through LLE.  See below.  Pt able to perform at min assist level.  Progressed to performing seated dynamic balance activity with feet unsupported to work on trunk shortening and lengthening while reaching for cones and varying targets.  Pt assisted back to room and performed stand step transfer back to recliner at min/guard assist with same cues mentioned above.  Also requires min cues for ensuring she extends LLE all the way back to chair prior to standing.  All needs in reach and son in room.   Therapy Documentation Precautions:  Precautions Precautions: Fall Precaution Comments: Impulsive, L hemiplegia, possible left visual field deficit Restrictions Weight Bearing Restrictions: No   Pain: Pain  Assessment Pain Assessment: No/denies pain   Other Treatments: Treatments Neuromuscular Facilitation: Left;Lower Extremity;Activity to increase coordination;Activity to increase motor control;Forced use;Activity to increase lateral weight shifting;Activity to increase sustained activation;Activity to increase timing and sequencing  See FIM for current functional status  Therapy/Group: Individual Therapy  Vista Deck 03/12/2013, 3:38 PM

## 2013-03-12 NOTE — Progress Notes (Signed)
Physical Therapy Session Note  Patient Details  Name: Patricia Black MRN: [id number] Date of Birth: [birth year]  Today's Date: 03/12/2013 Time: 1131-1200 Time Calculation (min): 29 min  Short Term Goals: Week 2:  PT Short Term Goal 1 (Week 2): Pt will be supervision with Transfers (bed<>chair) and demonstrating safe technique.  PT Short Term Goal 2 (Week 2): Pt will be able to ambulate 100 feet using LRAD at min A and min cues with decreased knee hyperextension.  PT Short Term Goal 3 (Week 2): Pt will be able to negotiate up/down 8 steps using a right railing with min A and min cues.  PT Short Term Goal 4 (Week 2): Pt will maintain dynamic standing balance at min assist during sessions.   Skilled Therapeutic Interventions/Progress Updates:   Pt received sitting in recliner in room with son present during session.  Focus of session was gait training with hemi walker for improved quality and initiating gait with use of LBQC.  Did very well with [facility], see full details below.  Assisted remainder of distance back to room in w/c and transfer min/guard with use of cane to recliner.  All needs in reach and educated son to don safety belt if/when he leaves room.  Pts son verbalized understanding.   Therapy Documentation Precautions:  Precautions Precautions: Fall Precaution Comments: Impulsive, L hemiplegia, possible left visual field deficit Restrictions Weight Bearing Restrictions: No   Pain: Pain Assessment Pain Assessment: No/denies pain  Ambulation Ambulation: Yes Ambulation/Gait Assistance: 4: Min assist Ambulation Distance (Feet): 150 Feet (then another 120') Assistive device: Hemi-walker;Large base quad cane Ambulation/Gait Assistance Details: Verbal cues for sequencing;Verbal cues for precautions/safety;Verbal cues for technique;Verbal cues for gait pattern;Manual facilitation for weight shifting;Verbal cues for safe use of DME/AE;Manual facilitation for weight  bearing Ambulation/Gait Assistance Details: Performed gait training with hemi walker from room to gym (>150') with min assist with continued cues for increased step length on LLE, upright posture, increased weight shift L during L stance and also for increased glute activation during L stance phase of gait.  Also provided min manual facilitation for increased weight shift to LLE.  Once in gym, allowed pt to rest before inititating gait with use of LBQC back towards room, also at min assist level with continues cues as mentioned previously and also to decrease gait speed for increased safety and ensuring clearance of LLE with each step.   Gait Gait: Yes Gait Pattern: Impaired Gait Pattern: Decreased step length - left;Decreased dorsiflexion - left;Decreased weight shift to right;Decreased stance time - left;Step-to pattern;Decreased weight shift to left;Trunk flexed;Narrow base of support;Trendelenburg   See FIM for current functional status  Therapy/Group: Individual Therapy  Vista Deck 03/12/2013, 12:43 PM

## 2013-03-12 NOTE — Progress Notes (Signed)
OccupationalTherapy Note  Patient Details  Name: Patricia Black MRN: [id number] Date of Birth: [birth year] Today's Date: 03/12/2013 Time: 0900-1000  (60 min) Pain:  none Individual session   Pt. Stated she had shower on Thursday and choose to bathe at sink.  Went from supine to sit with no assistance.  Sit to stand with minimal assist from bed.  Ambulated with hemi walker to sink.  Used wc to sit when needed.  Performed weight bearing on LUE/LLE in sitting and standing.  Went from sit to stand with minimal assist x4 from wc.    Instructed pt in positioning LUE on table to allow weight through arm while bathing self.  Dressed self with minimal assist for balance and rotational components during donning pants.  Pt.left in room with safety bellt on and ready for next session.   Humberto Seals 03/12/2013, 10:09 AM

## 2013-03-12 NOTE — Progress Notes (Signed)
Speech Language Pathology Daily Session Note  Patient Details  Name: Patricia Black MRN: [id number] Date of Birth: 18-Jan-1951  Today's Date: 03/12/2013 Time: 4782-9562 Time Calculation (min): 43 min  Short Term Goals: Week 2: SLP Short Term Goal 1 (Week 2): Patient will initiate, sequence and solve moderately complex problems with Supervision level verbal cues SLP Short Term Goal 2 (Week 2): Patient will self-monitor and correct errors with Min question cues  Skilled Therapeutic Interventions: Skilled treatment session focused on addressing cognitive goals. SLP facilitated session with a structured task that was introduced yesterday; patient was able to recall procedures with Supervision level question cues.  Additionally, patient was able to alternate attention between various task rules to accurately complete task with increased time.  SLP also facilitated session with whole dollar denominations money management task; patient required Supervision level verbal cues to keep place during task and increased time for accurate calculations.  Continue plan of care.   FIM:  Comprehension Comprehension Mode: Auditory Comprehension: 5-Understands complex 90% of the time/Cues < 10% of the time Expression Expression Mode: Verbal Expression: 6-Expresses complex ideas: With extra time/assistive device Social Interaction Social Interaction: 5-Interacts appropriately 90% of the time - Needs monitoring or encouragement for participation or interaction. Problem Solving Problem Solving: 5-Solves complex 90% of the time/cues < 10% of the time Memory Memory: 5-Recognizes or recalls 90% of the time/requires cueing < 10% of the time  Pain Pain Assessment Pain Assessment: No/denies pain  Therapy/Group: Individual Therapy  [name], CCC-SLP [phone]  Patricia Black 03/12/2013, 12:35 PM

## 2013-03-12 NOTE — Progress Notes (Signed)
Inpatient Diabetes Program Recommendations  AACE/ADA: New Consensus Statement on Inpatient Glycemic Control (2013)  Target Ranges:  Prepandial:   less than 140 mg/dL      Peak postprandial:   less than 180 mg/dL (1-2 hours)      Critically ill patients:  140 - 180 mg/dL   Reason for Visit: Hyperglycemia  Results for ALAHNI, VARONE (MRN [id number]) as of 03/12/2013 15:38  Ref. Range 03/11/2013 11:44 03/11/2013 17:00 03/11/2013 20:34 03/12/2013 07:30 03/12/2013 10:58  Glucose-Capillary Latest Range: 70-99 mg/dL 409 (H) 811 (H) 914 (H) 213 (H) 330 (H)    Inpatient Diabetes Program Recommendations Insulin - Basal: Consider increasing Levemir to 40 units bid Insulin - Meal Coverage: Increase Novolog to 12 units tidwc if pt eats >50% meal  Note: Needs daily adjustments of insulin until CBGs <180 mg/dL. Will continue to follow. Thank you. Ailene Ards, RD, LDN, CDE Inpatient Diabetes Coordinator [phone]

## 2013-03-13 ENCOUNTER — Inpatient Hospital Stay (HOSPITAL_COMMUNITY): Payer: Managed Care, Other (non HMO) | Admitting: Physical Therapy

## 2013-03-13 LAB — GLUCOSE, CAPILLARY
Glucose-Capillary: 243 mg/dL — ABNORMAL HIGH (ref 70–99)
Glucose-Capillary: 268 mg/dL — ABNORMAL HIGH (ref 70–99)
Glucose-Capillary: 283 mg/dL — ABNORMAL HIGH (ref 70–99)
Glucose-Capillary: 247 mg/dL — ABNORMAL HIGH (ref 70–99)

## 2013-03-13 NOTE — Plan of Care (Signed)
Problem: RH BOWEL ELIMINATION Goal: RH STG MANAGE BOWEL WITH ASSISTANCE STG Manage Bowel with min Assistance.  Outcome: Not Progressing LBM 12/8

## 2013-03-13 NOTE — Progress Notes (Signed)
62 y.o. right handed female history of migraine headaches as well as diabetes mellitus (insulin pump) who was admitted 02/24/2013 with left-sided weakness,dizziness as well as fall on her back porch-- found by [facility] when family was unable to get in touch with her. 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 MRI /MRA brain showed acute infarcts in the right basal ganglia and posterior right MCA territory with associated edema and occluded R-MCA beyond origin. Echocardiogram with ejection fraction 70% grade 2 diastolic dysfunction. [name]. Neurology recommended ASA for embolic infarct due to occlusion of R-MCA artery and TEE ordered for workup. TEE revealed PFO and BLE dopplers were positive for L-mid peroneal vein DVT. On Xarelto  Subjective/Complaints: No shoulder or hand pain No other c/os Increasing tone LUE but not interfering with PT/OT Review of Systems - Negative except weakness Left arm   Objective: Vital Signs: Blood pressure 113/56, pulse 59, temperature 98 F (36.7 C), temperature source Oral, resp. rate 17, height 5\' 7"  (1.702 m), weight 117.9 kg (259 lb 14.8 oz), SpO2 95.00%. No results found. Results for orders placed during the hospital encounter of 03/03/13 (from the past 72 hour(s))  GLUCOSE, CAPILLARY     Status: Abnormal   Collection Time    03/10/13 11:23 AM      Result Value Range   Glucose-Capillary 264 (*) 70 - 99 mg/dL   Comment 1 Notify RN    GLUCOSE, CAPILLARY     Status: Abnormal   Collection Time    03/10/13  4:29 PM      Result Value Range   Glucose-Capillary 241 (*) 70 - 99 mg/dL   Comment 1 Notify RN    GLUCOSE, CAPILLARY     Status: Abnormal   Collection Time    03/10/13  8:40 PM      Result Value Range   Glucose-Capillary 273 (*) 70 - 99 mg/dL  GLUCOSE, CAPILLARY     Status: Abnormal   Collection Time    03/11/13  7:22 AM      Result Value Range   Glucose-Capillary 217 (*) 70  - 99 mg/dL   Comment 1 Notify RN    GLUCOSE, CAPILLARY     Status: Abnormal   Collection Time    03/11/13 11:44 AM      Result Value Range   Glucose-Capillary 248 (*) 70 - 99 mg/dL   Comment 1 Notify RN    GLUCOSE, CAPILLARY     Status: Abnormal   Collection Time    03/11/13  5:00 PM      Result Value Range   Glucose-Capillary 296 (*) 70 - 99 mg/dL   Comment 1 Notify RN    GLUCOSE, CAPILLARY     Status: Abnormal   Collection Time    03/11/13  8:34 PM      Result Value Range   Glucose-Capillary 284 (*) 70 - 99 mg/dL  GLUCOSE, CAPILLARY     Status: Abnormal   Collection Time    03/12/13  7:30 AM      Result Value Range   Glucose-Capillary 213 (*) 70 - 99 mg/dL   Comment 1 Notify RN    GLUCOSE, CAPILLARY     Status: Abnormal   Collection Time    03/12/13 10:58 AM      Result Value Range   Glucose-Capillary 330 (*) 70 - 99 mg/dL   Comment 1 Notify RN    GLUCOSE, CAPILLARY  Status: Abnormal   Collection Time    03/12/13  4:33 PM      Result Value Range   Glucose-Capillary 171 (*) 70 - 99 mg/dL  GLUCOSE, CAPILLARY     Status: Abnormal   Collection Time    03/12/13  8:50 PM      Result Value Range   Glucose-Capillary 175 (*) 70 - 99 mg/dL   Comment 1 Notify RN       HEENT: normal. No congestion, drainage Cardio: RRR and no Murmur Resp: CTA B/L and unlabored. No wheezes or rhonchi. Chest clear GI: BS positive and non tender Extremity:  Pulses positive and No Edema Skin:   Intact Neuro: Alert/Oriented, Cranial Nerve Abnormalities Left central 7, , Normal Sensory, Abnormal Motor 2-/5 Left bi, tri, trace grip, trace abd, Abnormal FMC Ataxic/ dec FMC and Dysarthric, MAS 1 at elbow, Left fingers 2 Musc/Skel:  Other no pain with L shoulder ROM Gen NAD, alert, pleasant   Assessment/Plan: 1. Functional deficits secondary to R MCA embolic infarct  through PFO which require 3+ hours per day of interdisciplinary therapy in a comprehensive inpatient rehab setting. Physiatrist  is providing close team supervision and 24 hour management of active medical problems listed below. Physiatrist and rehab team continue to assess barriers to discharge/monitor patient progress toward functional and medical goals.  FIM: FIM - Bathing Bathing Steps Patient Completed: Chest;Left Arm;Abdomen;Front perineal area;Buttocks;Right upper leg;Left upper leg Bathing: 4: Min-Patient completes 8-9 53f 10 parts or 75+ percent  FIM - Upper Body Dressing/Undressing Upper body dressing/undressing steps patient completed: Thread/unthread right sleeve of pullover shirt/dresss;Put head through opening of pull over shirt/dress;Pull shirt over trunk Upper body dressing/undressing: 4: Min-Patient completed 75 plus % of tasks FIM - Lower Body Dressing/Undressing Lower body dressing/undressing steps patient completed: Thread/unthread right underwear leg;Thread/unthread left underwear leg;Thread/unthread right pants leg;Thread/unthread left pants leg;Don/Doff right sock;Don/Doff left sock;Pull pants up/down Lower body dressing/undressing: 4: Min-Patient completed 75 plus % of tasks  FIM - Toileting Toileting steps completed by patient: Adjust clothing prior to toileting;Performs perineal hygiene;Adjust clothing after toileting Toileting Assistive Devices: Grab bar or rail for support Toileting: 4: Steadying assist  FIM - Diplomatic Services operational officer Devices: Grab bars Toilet Transfers: 4-To toilet/BSC: Min A (steadying Pt. > 75%);4-From toilet/BSC: Min A (steadying Pt. > 75%) (per Loretta Plume, NT)  FIM - Bed/Chair Transfer Bed/Chair Transfer Assistive Devices: Arm rests Bed/Chair Transfer: 4: Supine > Sit: Min A (steadying Pt. > 75%/lift 1 leg);4: Bed > Chair or W/C: Min A (steadying Pt. > 75%)  FIM - Locomotion: Wheelchair Distance: 150 Locomotion: Wheelchair: 5: Travels 150 ft or more: maneuvers on rugs and over door sills with supervision, cueing or coaxing FIM - Locomotion:  Ambulation Locomotion: Ambulation Assistive Devices: Chief Operating Officer Ambulation/Gait Assistance: 4: Min assist Locomotion: Ambulation: 4: Travels 150 ft or more with minimal assistance (Pt.>75%)  Comprehension Comprehension Mode: Auditory Comprehension: 5-Understands complex 90% of the time/Cues < 10% of the time  Expression Expression Mode: Verbal Expression: 6-Expresses complex ideas: With extra time/assistive device  Social Interaction Social Interaction: 5-Interacts appropriately 90% of the time - Needs monitoring or encouragement for participation or interaction.  Problem Solving Problem Solving: 5-Solves complex 90% of the time/cues < 10% of the time  Memory Memory: 5-Recognizes or recalls 90% of the time/requires cueing < 10% of the time  Medical Problem List and Plan:  1. DVT left mid peroneal vein/Anticoagulation: Pharmaceutical: Xarelto  2. Pain Management: prn tylenol effective for pain.  3. Mood: Has good outlook and aware of time needed for recovery.   4. Neuropsych: This patient is capable of making decisions on her own behalf.  5. DM type 2--insulin dependent:uncontrolled On insulin pump at home--titrate levemir further today for basal insulin and use SSI as needed. Po intake is good.  6. Migraines: resumed Topamax and inderal at lower dose to avoid recurrence. Titrate as needed. --controlled for the most part-  -continue to assess 7. Oral pain: improved 8. Hyperactive airway: resumed Advair 9.  Spasticity LUE flexors, hand splint no meds needed thus far  LOS (Days) 10 A FACE TO FACE EVALUATION WAS PERFORMED  KIRSTEINS,ANDREW E 03/13/2013, 8:00 AM

## 2013-03-13 NOTE — Progress Notes (Signed)
Physical Therapy Session Note  Patient Details  Name: Patricia Black MRN: [id number] Date of Birth: [birth year]  Today's Date: 03/13/2013 Time: 9629-5284 Time Calculation (min): 29 min  Short Term Goals: Week 1:  PT Short Term Goal 1 (Week 1): Pt will be supervision with Transfers (bed<>chair) PT Short Term Goal 1 - Progress (Week 1): Progressing toward goal PT Short Term Goal 2 (Week 1): Pt will be able to ambulate 100 feet using LRAD at min A and min cues.  PT Short Term Goal 2 - Progress (Week 1): Progressing toward goal (has not met distance) PT Short Term Goal 3 (Week 1): Pt will be able to negotiate up/down 8 steps using a right railing with min A and min cues.  PT Short Term Goal 3 - Progress (Week 1): Progressing toward goal  Skilled Therapeutic Interventions/Progress Updates:  Pt was seen bedside in the am. Pt transferred supine to edge of bed with armrest and min A. Pt propelled w/c to gym with B LEs for LE strengthening about 150 feet. Performed cone taps with 6 inch cones to work on weight shifting and unilateral stance. Pt returned to room propelling w/c with B LEs for strengthening.    Therapy Documentation Precautions:  Precautions Precautions: Fall Precaution Comments: Impulsive, L hemiplegia, possible left visual field deficit Restrictions Weight Bearing Restrictions: No General:   Pain: No c/o pain.   See FIM for current functional status  Therapy/Group: Individual Therapy  Rayford Halsted 03/13/2013, 12:45 PM

## 2013-03-14 ENCOUNTER — Inpatient Hospital Stay (HOSPITAL_COMMUNITY): Payer: Managed Care, Other (non HMO) | Admitting: Physical Therapy

## 2013-03-14 LAB — GLUCOSE, CAPILLARY
Glucose-Capillary: 255 mg/dL — ABNORMAL HIGH (ref 70–99)
Glucose-Capillary: 308 mg/dL — ABNORMAL HIGH (ref 70–99)
Glucose-Capillary: 255 mg/dL — ABNORMAL HIGH (ref 70–99)
Glucose-Capillary: 288 mg/dL — ABNORMAL HIGH (ref 70–99)

## 2013-03-14 MED ORDER — TIZANIDINE HCL 2 MG PO TABS
2.0000 mg | ORAL_TABLET | Freq: Three times a day (TID) | ORAL | Status: DC | PRN
  Administered 2013-03-14: 2 mg via ORAL
  Filled 2013-03-14: qty 1

## 2013-03-14 MED ORDER — PRAMIPEXOLE DIHYDROCHLORIDE 0.25 MG PO TABS
0.2500 mg | ORAL_TABLET | Freq: Every evening | ORAL | Status: DC | PRN
  Administered 2013-03-14 – 2013-03-19 (×8): 0.25 mg via ORAL
  Filled 2013-03-14 (×14): qty 1

## 2013-03-14 NOTE — Progress Notes (Signed)
Dr. Wynn Banker notified of patient's c/o leg spasms; given Tylenol/Robaxin at 2126, without relief.  Order received:  Zanaflex 2 mg po TID prn.

## 2013-03-14 NOTE — Progress Notes (Signed)
Pt was on her home CPAP unit when RT arrived in the room.  Readjusted the mask for the patient.  Pt is on her home regimen pressure of 16 cmH2O. RT will continue to monitor

## 2013-03-14 NOTE — Progress Notes (Signed)
62 y.o. right handed female history of migraine headaches as well as diabetes mellitus (insulin pump) who was admitted 02/24/2013 with left-sided weakness,dizziness as well as fall on her back porch-- found by [facility] when family was unable to get in touch with her. 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 MRI /MRA brain showed acute infarcts in the right basal ganglia and posterior right MCA territory with associated edema and occluded R-MCA beyond origin. Echocardiogram with ejection fraction 70% grade 2 diastolic dysfunction. [name]. Neurology recommended ASA for embolic infarct due to occlusion of R-MCA artery and TEE ordered for workup. TEE revealed PFO and BLE dopplers were positive for L-mid peroneal vein DVT. On Xarelto  Subjective/Complaints: Lower ext " spasticity" severe last noc, this was in both legs, gives hx of Restless Leg SYndrome, has been on Mirapex Increasing tone LUE but not interfering with PT/OT Review of Systems - Negative except weakness Left arm   Objective: Vital Signs: Blood pressure 138/31, pulse 62, temperature 97.5 F (36.4 C), temperature source Oral, resp. rate 18, height 5\' 7"  (1.702 m), weight 117.9 kg (259 lb 14.8 oz), SpO2 98.00%. No results found. Results for orders placed during the hospital encounter of 03/03/13 (from the past 72 hour(s))  GLUCOSE, CAPILLARY     Status: Abnormal   Collection Time    03/11/13 11:44 AM      Result Value Range   Glucose-Capillary 248 (*) 70 - 99 mg/dL   Comment 1 Notify RN    GLUCOSE, CAPILLARY     Status: Abnormal   Collection Time    03/11/13  5:00 PM      Result Value Range   Glucose-Capillary 296 (*) 70 - 99 mg/dL   Comment 1 Notify RN    GLUCOSE, CAPILLARY     Status: Abnormal   Collection Time    03/11/13  8:34 PM      Result Value Range   Glucose-Capillary 284 (*) 70 - 99 mg/dL  GLUCOSE, CAPILLARY     Status: Abnormal   Collection  Time    03/12/13  7:30 AM      Result Value Range   Glucose-Capillary 213 (*) 70 - 99 mg/dL   Comment 1 Notify RN    GLUCOSE, CAPILLARY     Status: Abnormal   Collection Time    03/12/13 10:58 AM      Result Value Range   Glucose-Capillary 330 (*) 70 - 99 mg/dL   Comment 1 Notify RN    GLUCOSE, CAPILLARY     Status: Abnormal   Collection Time    03/12/13  4:33 PM      Result Value Range   Glucose-Capillary 171 (*) 70 - 99 mg/dL  GLUCOSE, CAPILLARY     Status: Abnormal   Collection Time    03/12/13  8:50 PM      Result Value Range   Glucose-Capillary 175 (*) 70 - 99 mg/dL   Comment 1 Notify RN    GLUCOSE, CAPILLARY     Status: Abnormal   Collection Time    03/13/13  7:48 AM      Result Value Range   Glucose-Capillary 243 (*) 70 - 99 mg/dL   Comment 1 Notify RN    GLUCOSE, CAPILLARY     Status: Abnormal   Collection Time    03/13/13 11:25 AM      Result Value Range   Glucose-Capillary 268 (*) 70 -  99 mg/dL   Comment 1 Notify RN    GLUCOSE, CAPILLARY     Status: Abnormal   Collection Time    03/13/13  4:18 PM      Result Value Range   Glucose-Capillary 283 (*) 70 - 99 mg/dL   Comment 1 Documented in Chart     Comment 2 Notify RN    GLUCOSE, CAPILLARY     Status: Abnormal   Collection Time    03/13/13  8:42 PM      Result Value Range   Glucose-Capillary 247 (*) 70 - 99 mg/dL   Comment 1 Notify RN    GLUCOSE, CAPILLARY     Status: Abnormal   Collection Time    03/14/13  7:24 AM      Result Value Range   Glucose-Capillary 255 (*) 70 - 99 mg/dL   Comment 1 Notify RN       HEENT: normal. No congestion, drainage Cardio: RRR and no Murmur Resp: CTA B/L and unlabored. No wheezes or rhonchi. Chest clear GI: BS positive and non tender Extremity:  Pulses positive and No Edema Skin:   Intact Neuro: Alert/Oriented, Cranial Nerve Abnormalities Left central 7, , Normal Sensory, Abnormal Motor 2-/5 Left bi, tri, trace grip, trace abd, Abnormal FMC Ataxic/ dec FMC and  Dysarthric, MAS 1 at elbow, Left fingers 2 Musc/Skel:  Other no pain with L shoulder ROM Gen NAD, alert, pleasant   Assessment/Plan: 1. Functional deficits secondary to R MCA embolic infarct  through PFO which require 3+ hours per day of interdisciplinary therapy in a comprehensive inpatient rehab setting. Physiatrist is providing close team supervision and 24 hour management of active medical problems listed below. Physiatrist and rehab team continue to assess barriers to discharge/monitor patient progress toward functional and medical goals.  FIM: FIM - Bathing Bathing Steps Patient Completed: Chest;Left Arm;Abdomen;Front perineal area;Buttocks;Right upper leg;Left upper leg Bathing: 4: Min-Patient completes 8-9 69f 10 parts or 75+ percent  FIM - Upper Body Dressing/Undressing Upper body dressing/undressing steps patient completed: Thread/unthread right sleeve of pullover shirt/dresss;Put head through opening of pull over shirt/dress;Pull shirt over trunk Upper body dressing/undressing: 4: Min-Patient completed 75 plus % of tasks FIM - Lower Body Dressing/Undressing Lower body dressing/undressing steps patient completed: Thread/unthread right underwear leg;Thread/unthread left underwear leg;Thread/unthread right pants leg;Thread/unthread left pants leg;Don/Doff right sock;Don/Doff left sock;Pull pants up/down Lower body dressing/undressing: 4: Min-Patient completed 75 plus % of tasks  FIM - Toileting Toileting steps completed by patient: Adjust clothing prior to toileting;Performs perineal hygiene;Adjust clothing after toileting Toileting Assistive Devices: Grab bar or rail for support Toileting: 4: Steadying assist  FIM - Diplomatic Services operational officer Devices: Grab bars Toilet Transfers: 4-To toilet/BSC: Min A (steadying Pt. > 75%);4-From toilet/BSC: Min A (steadying Pt. > 75%) (per Loretta Plume, NT)  FIM - Bed/Chair Transfer Bed/Chair Transfer Assistive Devices: Arm  rests Bed/Chair Transfer: 4: Bed > Chair or W/C: Min A (steadying Pt. > 75%)  FIM - Locomotion: Wheelchair Distance: 150 Locomotion: Wheelchair: 5: Travels 150 ft or more: maneuvers on rugs and over door sills with supervision, cueing or coaxing FIM - Locomotion: Ambulation Locomotion: Ambulation Assistive Devices: Chief Operating Officer Ambulation/Gait Assistance: 4: Min assist Locomotion: Ambulation: 4: Travels 150 ft or more with minimal assistance (Pt.>75%)  Comprehension Comprehension Mode: Auditory Comprehension: 5-Understands complex 90% of the time/Cues < 10% of the time  Expression Expression Mode: Verbal Expression: 6-Expresses complex ideas: With extra time/assistive device  Social Interaction Social Interaction: 5-Interacts appropriately 90% of  the time - Needs monitoring or encouragement for participation or interaction.  Problem Solving Problem Solving: 5-Solves complex 90% of the time/cues < 10% of the time  Memory Memory: 5-Recognizes or recalls 90% of the time/requires cueing < 10% of the time  Medical Problem List and Plan:  1. DVT left mid peroneal vein/Anticoagulation: Pharmaceutical: Xarelto  2. Pain Management: prn tylenol effective for pain.  3. Mood: Has good outlook and aware of time needed for recovery.   4. Neuropsych: This patient is capable of making decisions on her own behalf.  5. DM type 2--insulin dependent:uncontrolled On insulin pump at home--titrate levemir further today for basal insulin and use SSI as needed. Po intake is good.  6. Migraines: resumed Topamax and inderal at lower dose to avoid recurrence. Titrate as needed. --controlled for the most part-  -continue to assess 7. Oral pain: improved 8. Hyperactive airway: resumed Advair 9.  Spasticity LUE flexors, hand splint no meds needed thus far 10.  RLS, change mirapex to .25mg  qhs, MRx1 LOS (Days) 11 A FACE TO FACE EVALUATION WAS PERFORMED  Keval Nam E 03/14/2013, 8:11 AM

## 2013-03-14 NOTE — Progress Notes (Signed)
Pt does not wish to wear CPAP tonight. Pt encouraged to call RT if pt changes mind. No distress noted at this time. 

## 2013-03-14 NOTE — Progress Notes (Signed)
Physical Therapy Session Note  Patient Details  Name: Patricia Black MRN: [id number] Date of Birth: Aug 08, 1950  Today's Date: 03/14/2013 Time: 0801-0859 Time Calculation (min): 58 min  Short Term Goals: Week 1:  PT Short Term Goal 1 (Week 1): Pt will be supervision with Transfers (bed<>chair) PT Short Term Goal 1 - Progress (Week 1): Progressing toward goal PT Short Term Goal 2 (Week 1): Pt will be able to ambulate 100 feet using LRAD at min A and min cues.  PT Short Term Goal 2 - Progress (Week 1): Progressing toward goal (has not met distance) PT Short Term Goal 3 (Week 1): Pt will be able to negotiate up/down 8 steps using a right railing with min A and min cues.  PT Short Term Goal 3 - Progress (Week 1): Progressing toward goal  Skilled Therapeutic Interventions/Progress Updates:  Pt was seen bedside in the am. Pt transferred supine to edge of bed on L side with head of bed flat and mod A with verbal cues. Pt transferred edge of bed to w/c with min A. Pt propelled w/c to gym about 150 feet with B LEs and R UE. Pt ambulated with [facility] and min A about 80 feet with occasional verbal cues for technique and safety. Pt ascended/descended 1 flight of stairs with R rail  (11) with rest break in between, pt required in A to ascended stairs and min to mod A to descended stairs secondary to fatigue. Performed cone taps with 6 inch cones, 3 sets x 10 reps each. Pt propelled w/c back room with B LEs and R UE.    Therapy Documentation Precautions:  Precautions Precautions: Fall Precaution Comments: Impulsive, L hemiplegia, possible left visual field deficit Restrictions Weight Bearing Restrictions: No General:  Pain: No c/o pain.    Locomotion : Ambulation Ambulation/Gait Assistance: 4: Min assist   See FIM for current functional status  Therapy/Group: Individual Therapy  Rayford Halsted 03/14/2013, 12:32 PM

## 2013-03-15 ENCOUNTER — Inpatient Hospital Stay (HOSPITAL_COMMUNITY): Payer: Managed Care, Other (non HMO) | Admitting: Occupational Therapy

## 2013-03-15 ENCOUNTER — Inpatient Hospital Stay (HOSPITAL_COMMUNITY): Payer: Managed Care, Other (non HMO) | Admitting: Speech Pathology

## 2013-03-15 ENCOUNTER — Inpatient Hospital Stay (HOSPITAL_COMMUNITY): Payer: Self-pay | Admitting: Rehabilitation

## 2013-03-15 DIAGNOSIS — I6330 Cerebral infarction due to thrombosis of unspecified cerebral artery: Secondary | ICD-10-CM

## 2013-03-15 DIAGNOSIS — G8110 Spastic hemiplegia affecting unspecified side: Secondary | ICD-10-CM

## 2013-03-15 DIAGNOSIS — IMO0001 Reserved for inherently not codable concepts without codable children: Secondary | ICD-10-CM

## 2013-03-15 DIAGNOSIS — E1165 Type 2 diabetes mellitus with hyperglycemia: Secondary | ICD-10-CM

## 2013-03-15 LAB — GLUCOSE, CAPILLARY
Glucose-Capillary: 185 mg/dL — ABNORMAL HIGH (ref 70–99)
Glucose-Capillary: 185 mg/dL — ABNORMAL HIGH (ref 70–99)

## 2013-03-15 MED ORDER — INSULIN DETEMIR 100 UNIT/ML ~~LOC~~ SOLN
35.0000 [IU] | Freq: Every day | SUBCUTANEOUS | Status: DC
  Administered 2013-03-15 – 2013-03-16 (×2): 35 [IU] via SUBCUTANEOUS
  Filled 2013-03-15 (×3): qty 0.35

## 2013-03-15 NOTE — Progress Notes (Signed)
62 y.o. right handed female history of migraine headaches as well as diabetes mellitus (insulin pump) who was admitted 02/24/2013 with left-sided weakness,dizziness as well as fall on her back porch-- found by [facility] when family was unable to get in touch with her. 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 MRI /MRA brain showed acute infarcts in the right basal ganglia and posterior right MCA territory with associated edema and occluded R-MCA beyond origin. Echocardiogram with ejection fraction 70% grade 2 diastolic dysfunction. [name]. Neurology recommended ASA for embolic infarct due to occlusion of R-MCA artery and TEE ordered for workup. TEE revealed PFO and BLE dopplers were positive for L-mid peroneal vein DVT. On Xarelto  Subjective/Complaints: No spasms or pain Slept well No c/os today Review of Systems - Negative except weakness Left arm   Objective: Vital Signs: Blood pressure 119/36, pulse 67, temperature 97.6 F (36.4 C), temperature source Oral, resp. rate 18, height 5\' 7"  (1.702 m), weight 117.9 kg (259 lb 14.8 oz), SpO2 97.00%. No results found. Results for orders placed during the hospital encounter of 03/03/13 (from the past 72 hour(s))  GLUCOSE, CAPILLARY     Status: Abnormal   Collection Time    03/12/13 10:58 AM      Result Value Range   Glucose-Capillary 330 (*) 70 - 99 mg/dL   Comment 1 Notify RN    GLUCOSE, CAPILLARY     Status: Abnormal   Collection Time    03/12/13  4:33 PM      Result Value Range   Glucose-Capillary 171 (*) 70 - 99 mg/dL  GLUCOSE, CAPILLARY     Status: Abnormal   Collection Time    03/12/13  8:50 PM      Result Value Range   Glucose-Capillary 175 (*) 70 - 99 mg/dL   Comment 1 Notify RN    GLUCOSE, CAPILLARY     Status: Abnormal   Collection Time    03/13/13  7:48 AM      Result Value Range   Glucose-Capillary 243 (*) 70 - 99 mg/dL   Comment 1 Notify RN     GLUCOSE, CAPILLARY     Status: Abnormal   Collection Time    03/13/13 11:25 AM      Result Value Range   Glucose-Capillary 268 (*) 70 - 99 mg/dL   Comment 1 Notify RN    GLUCOSE, CAPILLARY     Status: Abnormal   Collection Time    03/13/13  4:18 PM      Result Value Range   Glucose-Capillary 283 (*) 70 - 99 mg/dL   Comment 1 Documented in Chart     Comment 2 Notify RN    GLUCOSE, CAPILLARY     Status: Abnormal   Collection Time    03/13/13  8:42 PM      Result Value Range   Glucose-Capillary 247 (*) 70 - 99 mg/dL   Comment 1 Notify RN    GLUCOSE, CAPILLARY     Status: Abnormal   Collection Time    03/14/13  7:24 AM      Result Value Range   Glucose-Capillary 255 (*) 70 - 99 mg/dL   Comment 1 Notify RN    GLUCOSE, CAPILLARY     Status: Abnormal   Collection Time    03/14/13 11:44 AM      Result Value Range   Glucose-Capillary 308 (*) 70 - 99 mg/dL  Comment 1 Notify RN    GLUCOSE, CAPILLARY     Status: Abnormal   Collection Time    03/14/13  4:50 PM      Result Value Range   Glucose-Capillary 255 (*) 70 - 99 mg/dL   Comment 1 Notify RN    GLUCOSE, CAPILLARY     Status: Abnormal   Collection Time    03/14/13  8:46 PM      Result Value Range   Glucose-Capillary 288 (*) 70 - 99 mg/dL   Comment 1 Notify RN    GLUCOSE, CAPILLARY     Status: Abnormal   Collection Time    03/14/13 10:40 PM      Result Value Range   Glucose-Capillary 253 (*) 70 - 99 mg/dL  GLUCOSE, CAPILLARY     Status: Abnormal   Collection Time    03/15/13  7:28 AM      Result Value Range   Glucose-Capillary 241 (*) 70 - 99 mg/dL   Comment 1 Notify RN       HEENT: normal. No congestion, drainage Cardio: RRR and no Murmur Resp: CTA B/L and unlabored. No wheezes or rhonchi. Chest clear GI: BS positive and non tender Extremity:  Pulses positive and No Edema Skin:   Intact Neuro: Alert/Oriented, Cranial Nerve Abnormalities Left central 7, , Normal Sensory, Abnormal Motor 2-/5 Left bi, tri, trace  grip, trace abd, Abnormal FMC Ataxic/ dec FMC and Dysarthric, MAS 1 at elbow, Left fingers 2 Musc/Skel:  Other no pain with L shoulder ROM Gen NAD, alert, pleasant   Assessment/Plan: 1. Functional deficits secondary to R MCA embolic infarct  through PFO which require 3+ hours per day of interdisciplinary therapy in a comprehensive inpatient rehab setting. Physiatrist is providing close team supervision and 24 hour management of active medical problems listed below. Physiatrist and rehab team continue to assess barriers to discharge/monitor patient progress toward functional and medical goals.  FIM: FIM - Bathing Bathing Steps Patient Completed: Chest;Left Arm;Abdomen;Front perineal area;Buttocks;Right upper leg;Left upper leg Bathing: 4: Min-Patient completes 8-9 22f 10 parts or 75+ percent  FIM - Upper Body Dressing/Undressing Upper body dressing/undressing steps patient completed: Thread/unthread right sleeve of pullover shirt/dresss;Thread/unthread left sleeve of pullover shirt/dress;Put head through opening of pull over shirt/dress;Pull shirt over trunk Upper body dressing/undressing: 5: Supervision: Safety issues/verbal cues FIM - Lower Body Dressing/Undressing Lower body dressing/undressing steps patient completed: Thread/unthread right underwear leg;Pull underwear up/down;Thread/unthread right pants leg;Pull pants up/down Lower body dressing/undressing: 4: Min-Patient completed 75 plus % of tasks  FIM - Toileting Toileting steps completed by patient: Adjust clothing prior to toileting;Performs perineal hygiene;Adjust clothing after toileting Toileting Assistive Devices: Grab bar or rail for support Toileting: 5: Supervision: Safety issues/verbal cues  FIM - Diplomatic Services operational officer Devices: Cane;Grab bars Toilet Transfers: 4-To toilet/BSC: Min A (steadying Pt. > 75%);4-From toilet/BSC: Min A (steadying Pt. > 75%)  FIM - Bed/Chair Transfer Bed/Chair Transfer  Assistive Devices: Arm rests Bed/Chair Transfer: 3: Supine > Sit: Mod A (lifting assist/Pt. 50-74%/lift 2 legs;4: Bed > Chair or W/C: Min A (steadying Pt. > 75%)  FIM - Locomotion: Wheelchair Distance: 150 Locomotion: Wheelchair: 5: Travels 150 ft or more: maneuvers on rugs and over door sills with supervision, cueing or coaxing FIM - Locomotion: Ambulation Locomotion: Ambulation Assistive Devices: Occupational hygienist Ambulation/Gait Assistance: 4: Min assist Locomotion: Ambulation: 2: Travels 50 - 149 ft with minimal assistance (Pt.>75%)  Comprehension Comprehension Mode: Auditory Comprehension: 5-Understands complex 90% of the time/Cues < 10%  of the time  Expression Expression Mode: Verbal Expression: 6-Expresses complex ideas: With extra time/assistive device  Social Interaction Social Interaction: 5-Interacts appropriately 90% of the time - Needs monitoring or encouragement for participation or interaction.  Problem Solving Problem Solving: 5-Solves complex 90% of the time/cues < 10% of the time  Memory Memory: 5-Recognizes or recalls 90% of the time/requires cueing < 10% of the time  Medical Problem List and Plan:  1. DVT left mid peroneal vein/Anticoagulation: Pharmaceutical: Xarelto  2. Pain Management: prn tylenol effective for pain.  3. Mood: Has good outlook and aware of time needed for recovery.   4. Neuropsych: This patient is capable of making decisions on her own behalf.  5. DM type 2--insulin dependent:uncontrolled On insulin pump at home--titrate levemir further today for basal insulin and use SSI as needed. Po intake is good.  6. Migraines: resumed Topamax and inderal at lower dose to avoid recurrence. Titrate as needed. --controlled for the most part-  -continue to assess 7. Oral pain: improved 8. Hyperactive airway: resumed Advair 9.  Spasticity LUE flexors, hand splint no meds needed thus far 10.  RLS, change mirapex to .25mg  qhs, MRx1 LOS (Days) 12 A FACE TO FACE  EVALUATION WAS PERFORMED  Patricia Black 03/15/2013, 8:50 AM

## 2013-03-15 NOTE — Progress Notes (Signed)
Speech Language Pathology Daily Session Note  Patient Details  Name: Patricia Black MRN: [id number] Date of Birth: 1950/05/02  Today's Date: 03/15/2013 Time: 0920-1000 Time Calculation (min): 40 min  Short Term Goals: Week 2: SLP Short Term Goal 1 (Week 2): Patient will initiate, sequence and solve moderately complex problems with Supervision level verbal cues SLP Short Term Goal 2 (Week 2): Patient will self-monitor and correct errors with Min question cues  Skilled Therapeutic Interventions: Skilled treatment session focused on addressing cognitive goals. SLP facilitated session with financial management task with Min cues to organize and sequence task and Supervision level verbal cues to self-monitor and correct errors.  SLP also educated patient regarding modifications for home management with tasks and patient took notes Mod I with phone application.  Continue with current plan of care.      FIM:  Comprehension Comprehension Mode: Auditory Comprehension: 5-Understands complex 90% of the time/Cues < 10% of the time Expression Expression Mode: Verbal Expression: 6-Expresses complex ideas: With extra time/assistive device Social Interaction Social Interaction: 6-Interacts appropriately with others with medication or extra time (anti-anxiety, antidepressant). Problem Solving Problem Solving: 5-Solves complex 90% of the time/cues < 10% of the time Memory Memory: 6-Assistive device: No helper  Pain Pain Assessment Pain Assessment: No/denies pain  Therapy/Group: Individual Therapy  [name], CCC-SLP [phone]  Patricia Black 03/15/2013, 4:20 PM

## 2013-03-15 NOTE — Progress Notes (Signed)
Occupational Therapy Weekly Progress Note  Patient Details  Name: Patricia Black MRN: [id number] Date of Birth: 02/15/1951  Today's Date: 03/15/2013 Time: 7829-5621 and 3086-5784 Time Calculation (min): 57 min and 30 min  Patient has met 5 of 5 short term goals.  Pt is making steady progress towards goals.  Pt requires min assist for functional mobility and transfers with [facility] and min assist for LB dressing due to LUE weakness and body habitus.  Pt is developing active shoulder and elbow movements and some gross grasp, however not yet functional.  Use of NMES has been introduced to focus on finger flexion/extension.    Patient continues to demonstrate the following deficits: decreased functional use of LUE, decreased static and dynamic standing balance, decreased WB through LLE, decreased safety awareness and therefore will continue to benefit from skilled OT intervention to enhance overall performance with BADL, iADL and Reduce care partner burden.  Patient progressing toward long term goals..  Continue plan of care.  OT Short Term Goals Week 1:  OT Short Term Goal 1 (Week 1): Pt will perform UB dressing with supervision and mod instructional cueing. OT Short Term Goal 1 - Progress (Week 1): Met OT Short Term Goal 2 (Week 1): Pt will donn pullover underpants and pants with min assist sit to stand. OT Short Term Goal 2 - Progress (Week 1): Met OT Short Term Goal 3 (Week 1): Pt will perform toilet transfers with min assist to elevated toilet/3:1. OT Short Term Goal 3 - Progress (Week 1): Met OT Short Term Goal 4 (Week 1): Pt will use the LUE as a stabilizer with min assist during bathing tasks. OT Short Term Goal 4 - Progress (Week 1): Met OT Short Term Goal 5 (Week 1): Pt will initiate and perform all bathing with no more than min instructional cueing during session. OT Short Term Goal 5 - Progress (Week 1): Met Week 2:  OT Short Term Goal 1 (Week 2): STG = LTGs due to remaining  LOS  Skilled Therapeutic Interventions/Progress Updates:    1) Pt seen for ADL retraining with focus on functional transfers, use of LUE during bathing and dressing tasks, and sequencing of bathing. Pt ambulated from bed to walk-in shower with [facility] with min assist/contact guard. Pt demonstrating increased sequencing and use of AE to assist with bathing with use of wash mit and long handled sponge.  Pt completed bathing with use of wash mit on Lt hand when washing RUE, demonstrating improved shoulder flexion and horizontal adduction to increase thoroughness with bathing. Pt continues to require assistance to pull pants up over Lt hip, secondary to decreased grasp in Lt hand and body habitus. Pt completely donned shirt with increased time for hemi-dressing technique.  Discussed DME for d/c home at end of week with pt reporting daughter already purchased tub bench for shower and shouldn't need BSC.  2) Pt seen for 1:1 OT with focus on adaptive techniques to complete meal prep in kitchen, standing balance, and activity tolerance.  Pt used adaptive cutting board to stabilize food item while cutting with Rt hand and educated on weight bearing through LUE on counter even if not actively using it for task.  Pt stood for entire session with no LOB and supervision.  Discussed safety with cooking tasks and activity tolerance with taking necessary rest breaks and planning things out to ensure success with self-care tasks and meal prep tasks.    Therapy Documentation Precautions:  Precautions Precautions: Fall Precaution Comments: Impulsive, L  hemiplegia, possible left visual field deficit Restrictions Weight Bearing Restrictions: No General:   Vital Signs: Oxygen Therapy SpO2: 97 % O2 Device: None (Room air) Pain:  Pt with no c/o pain  See FIM for current functional status  Therapy/Group: Individual Therapy  Rosalio Loud 03/15/2013, 10:38 AM

## 2013-03-15 NOTE — Progress Notes (Signed)
Physical Therapy Session Note  Patient Details  Name: Patricia Black MRN: [id number] Date of Birth: 1951-03-17  Today's Date: 03/15/2013 Time: 5621-3086 Time Calculation (min): 53 min  Short Term Goals: Week 2:  PT Short Term Goal 1 (Week 2): Pt will be supervision with Transfers (bed<>chair) and demonstrating safe technique.  PT Short Term Goal 2 (Week 2): Pt will be able to ambulate 100 feet using LRAD at min A and min cues with decreased knee hyperextension.  PT Short Term Goal 3 (Week 2): Pt will be able to negotiate up/down 8 steps using a right railing with min A and min cues.  PT Short Term Goal 4 (Week 2): Pt will maintain dynamic standing balance at min assist during sessions.   Skilled Therapeutic Interventions/Progress Updates:   Pt received sitting in w/c in room.  Daughter and mother present in room, however did not attend therapy session.  Pt performed gait training >150' x 1 rep with LBQC at min assist this morning.  Provided min manual facilitation for increased weight shift L with cues for increased glute activation during L stance and also increased step length on LLE.  Note that pts gait speed is improving safely during this session.  Did not have to cue her as much to clear LLE during L swing phase of gait.  Once in gym, performed ambulation with kicking weighted box in order to increased weight shift and weight bearing through LLE and also increased hip flex strength in LLE.  Performed x 80' x 1 with use of LBQC at min/guard assist, then performed x another 28' without AD to further challenge balance and increase weight shift and weight bearing through LLE.  Requires intermittent mod assist due to LOB during activity and also mod cues to continue kicking ball (unsure if inattention or simply did not wish to kick box).  Ended session with performing floor transfer.  Requires mod assist to get down into tall kneeling, then down to SL (quickly transitioned from tall kneeling,  quadruped, SL) and then into supine.  She was able to get back into SL with mod assist, however when attempting to elevate hips to attain quadruped/tall kneeling, she was unable to fully elevate hips nor get UEs under her (esp L UE), therefore requires +2 assist (one therapist assisting hips and another to manage LUE/trunk and once in tall kneeling, assisted hips and LEs to get into proper position to stand and turn to sit on mat.  Pt very fatigued following floor transfer, therefore allowed pt to rest several mins before she self propelled >150' back to room using BLE's, RUE at supervision level.  Min cues for increased step length on LLE.  Pt left in room in w/c with all needs in reach and family in room.   Therapy Documentation Precautions:  Precautions Precautions: Fall Precaution Comments: Impulsive, L hemiplegia, possible left visual field deficit Restrictions Weight Bearing Restrictions: No General: Amount of Missed PT Time (min): 7 Minutes Missed Time Reason: Other (comment) (therapist late from previous pt)   Pain: Pain Assessment Pain Assessment: No/denies pain   Locomotion : Ambulation Ambulation/Gait Assistance: 4: Min assist Wheelchair Mobility Distance: 150   See FIM for current functional status  Therapy/Group: Individual Therapy  Vista Deck 03/15/2013, 2:03 PM

## 2013-03-16 ENCOUNTER — Inpatient Hospital Stay (HOSPITAL_COMMUNITY): Payer: Managed Care, Other (non HMO) | Admitting: Occupational Therapy

## 2013-03-16 ENCOUNTER — Inpatient Hospital Stay (HOSPITAL_COMMUNITY): Payer: Managed Care, Other (non HMO) | Admitting: Speech Pathology

## 2013-03-16 ENCOUNTER — Inpatient Hospital Stay (HOSPITAL_COMMUNITY): Payer: Self-pay | Admitting: Rehabilitation

## 2013-03-16 LAB — GLUCOSE, CAPILLARY
Glucose-Capillary: 274 mg/dL — ABNORMAL HIGH (ref 70–99)
Glucose-Capillary: 145 mg/dL — ABNORMAL HIGH (ref 70–99)

## 2013-03-16 NOTE — Progress Notes (Signed)
Physical Therapy Session Note  Patient Details  Name: Patricia Black MRN: [id number] Date of Birth: 10-20-1950  Today's Date: 03/16/2013 Time:  -     Short Term Goals: Week 2:  PT Short Term Goal 1 (Week 2): Pt will be supervision with Transfers (bed<>chair) and demonstrating safe technique.  PT Short Term Goal 2 (Week 2): Pt will be able to ambulate 100 feet using LRAD at min A and min cues with decreased knee hyperextension.  PT Short Term Goal 3 (Week 2): Pt will be able to negotiate up/down 8 steps using a right railing with min A and min cues.  PT Short Term Goal 4 (Week 2): Pt will maintain dynamic standing balance at min assist during sessions.   Skilled Therapeutic Interventions/Progress Updates:   Pt received in w/c in room, gathering socks to don for session.  Assisted with donning socks this morning, as she demonstrates increased difficulty bending over to don socks.  Pt self propelled to gym >150' at supervision level.  Continue to note some L inattention when propelling w/c, however is able to correct following cuing.  Once in gym, performed gait training with new shoes.  See below.  Performed standing kinetron activity with mild resistance in order to increase weight shifting and weight bearing through LLE, esp to increase glute activation in L stance.  Performed x 20 reps x 3 sets.  Able to tolerate well, however did have increased c/o fatigue with last rep.  Ended session with sit <> stand from varying heights of mat surface with R hand over L hand on L leg to increase weight bearing through LLE when sitting and standing.  She was able to perform all with supervision with cues for controlled descent, as she also tends to sit with weight shifted more to R LE.   Assisted pt back to room via w/c with all needs in reach and RN in room to provide meds.    Therapy Documentation Precautions:  Precautions Precautions: Fall Precaution Comments: Impulsive, L hemiplegia, possible left  visual field deficit Restrictions Weight Bearing Restrictions: No   Vital Signs: Therapy Vitals Temp: 98.3 F (36.8 C) Temp src: Oral Pulse Rate: 57 Resp: 18 BP: 114/73 mmHg Patient Position, if appropriate: Lying Oxygen Therapy SpO2: 96 % O2 Device: None (Room air) Pain: pt with some c/o fatigue in L knee following session.     Locomotion : Ambulation Ambulation: Yes Ambulation/Gait Assistance: 4: Min assist;4: Min guard Assistive device: Large base quad cane Ambulation/Gait Assistance Details: Verbal cues for sequencing;Verbal cues for precautions/safety;Verbal cues for technique;Verbal cues for gait pattern;Manual facilitation for weight shifting;Verbal cues for safe use of DME/AE;Manual facilitation for weight bearing Ambulation/Gait Assistance Details: Pt with new shoes this morning, therefore focused on gait training with new shoes with more medial support.  Performed 87' x 2 reps (first rep without heel lift and second rep with heel lift).  Knee hyperextension did improve slightly with heel lift, however was not extreme without heel lift.  Continues to require min manual facilitation for increased weight shift L with tapping at glute for increased actrivation and also mod cues for upright posture, increased glute activation and also for increased DF during swing phase of gait.  Gait Gait: Yes Gait Pattern: Impaired Gait Pattern: Decreased step length - left;Decreased dorsiflexion - left;Decreased weight shift to right;Decreased stance time - left;Step-to pattern;Decreased weight shift to left;Trunk flexed;Narrow base of support;Trendelenburg  Trunk/Postural Assessment :    Balance:   Exercises:   Other  Treatments:    See FIM for current functional status  Therapy/Group: Individual Therapy  Vista Deck 03/16/2013, 9:03 AM

## 2013-03-16 NOTE — Progress Notes (Signed)
Speech Language Pathology Daily Session Note  Patient Details  Name: Patricia Black MRN: [id number] Date of Birth: May 10, 1950  Today's Date: 03/16/2013 Time: 1005-1045 Time Calculation (min): 40 min  Short Term Goals: Week 2: SLP Short Term Goal 1 (Week 2): Patient will initiate, sequence and solve moderately complex problems with Supervision level verbal cues SLP Short Term Goal 2 (Week 2): Patient will self-monitor and correct errors with Min question cues  Skilled Therapeutic Interventions: Skilled treatment session focused on addressing cognitive goals. SLP facilitated session with medication management task with Min cues for mental flexibility due to changes post hospitalization and Min cues to self-monitior and correct errors.  After loading medication box patient was able to identify need for new box due to increased frequency of some medications.  Continue wtih current plan of care.      FIM:  Comprehension Comprehension Mode: Auditory Comprehension: 5-Understands complex 90% of the time/Cues < 10% of the time Expression Expression Mode: Verbal Expression: 6-Expresses complex ideas: With extra time/assistive device Social Interaction Social Interaction: 6-Interacts appropriately with others with medication or extra time (anti-anxiety, antidepressant). Problem Solving Problem Solving: 5-Solves basic problems: With no assist Memory Memory: 6-Assistive device: No helper  Pain Pain Assessment Pain Assessment: No/denies pain  Therapy/Group: Individual Therapy  Patricia Black., CCC-SLP [phone]  Patricia Black 03/16/2013, 12:00 PM

## 2013-03-16 NOTE — Progress Notes (Signed)
Patient has a home CPAP machine that she is using. Patient states she can go on and off her machine as she likes. Patient is aware to call RT if she needs any help with the machine. RT will continue to assist as needed.

## 2013-03-16 NOTE — Progress Notes (Signed)
Occupational Therapy Session Note  Patient Details  Name: Patricia Black MRN: [id number] Date of Birth: [birth year]  Today's Date: 03/16/2013 Time: 7829-5621 and 3086-5784 Time Calculation (min): 58 min and 30 min  Short Term Goals: Week 2:  OT Short Term Goal 1 (Week 2): STG = LTGs due to remaining LOS  Skilled Therapeutic Interventions/Progress Updates:    1) Pt seen for ADL retraining with focus on bed mobility, functional ambulation with LBQC, and hemi-technique with bathing and dressing.  Pt required min assist supine to sit with use of bed rails secondary to pain in RUE limiting ability to complete bed mobility without assist this session.  Pt ambulated to dresser with close supervision to gather clothing prior to bathing and dressing, pt with no LOB when bending down to bottom drawer to retrieve items.  Engaged in bathing and dressing at sink with pt with increased Lt shoulder movement with UB bathing.  Pt continues to require min assist to pull pants over Lt hip secondary to LUE weakness and body habitus, despite increased time and effort.  2) Pt seen for 1:1 OT with focus on standing balance, activity tolerance, and functional use of LUE in simple meal prep in kitchen.  Pt engaged in side stepping at counter in ADL kitchen to gather necessary items and complete hand hygiene in preparation for baking cookies.  Pt stood at sink with use of LUE with hand over hand assist to stabilize bowl while mixing ingredients.  Pt required assist to open containers, but attempted and then asked for assistance.  Pt required 1 rest break as she reports more tired today.  Discussed safety with cooking tasks due to hot oven and items within reach.  Therapy Documentation Precautions:  Precautions Precautions: Fall Precaution Comments: Impulsive, L hemiplegia, possible left visual field deficit Restrictions Weight Bearing Restrictions: No General:   Vital Signs: Therapy Vitals Temp: 98.3 F (36.8  C) Temp src: Oral Pulse Rate: 57 Resp: 18 BP: 114/73 mmHg Patient Position, if appropriate: Lying Oxygen Therapy SpO2: 96 % O2 Device: None (Room air) Pain:  Pt reports pain in RUE due to overuse in therapy yesterday, denied pain meds  See FIM for current functional status  Therapy/Group: Individual Therapy  Rosalio Loud 03/16/2013, 9:11 AM

## 2013-03-16 NOTE — Progress Notes (Signed)
62 y.o. right handed female history of migraine headaches as well as diabetes mellitus (insulin pump) who was admitted 02/24/2013 with left-sided weakness,dizziness as well as fall on her back porch-- found by [facility] when family was unable to get in touch with her. 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 MRI /MRA brain showed acute infarcts in the right basal ganglia and posterior right MCA territory with associated edema and occluded R-MCA beyond origin. Echocardiogram with ejection fraction 70% grade 2 diastolic dysfunction. [name]. Neurology recommended ASA for embolic infarct due to occlusion of R-MCA artery and TEE ordered for workup. TEE revealed PFO and BLE dopplers were positive for L-mid peroneal vein DVT. On Xarelto  Subjective/Complaints: R UE pain, used it a lot with therapy yesterday No Bowel or Bladder issues Amb with OT in rm with Quad cane Review of Systems - Negative except weakness Left arm   Objective: Vital Signs: Blood pressure 114/73, pulse 57, temperature 98.3 F (36.8 C), temperature source Oral, resp. rate 18, height 5\' 7"  (1.702 m), weight 117.9 kg (259 lb 14.8 oz), SpO2 96.00%. No results found. Results for orders placed during the hospital encounter of 03/03/13 (from the past 72 hour(s))  GLUCOSE, CAPILLARY     Status: Abnormal   Collection Time    03/13/13 11:25 AM      Result Value Range   Glucose-Capillary 268 (*) 70 - 99 mg/dL   Comment 1 Notify RN    GLUCOSE, CAPILLARY     Status: Abnormal   Collection Time    03/13/13  4:18 PM      Result Value Range   Glucose-Capillary 283 (*) 70 - 99 mg/dL   Comment 1 Documented in Chart     Comment 2 Notify RN    GLUCOSE, CAPILLARY     Status: Abnormal   Collection Time    03/13/13  8:42 PM      Result Value Range   Glucose-Capillary 247 (*) 70 - 99 mg/dL   Comment 1 Notify RN    GLUCOSE, CAPILLARY     Status: Abnormal   Collection  Time    03/14/13  7:24 AM      Result Value Range   Glucose-Capillary 255 (*) 70 - 99 mg/dL   Comment 1 Notify RN    GLUCOSE, CAPILLARY     Status: Abnormal   Collection Time    03/14/13 11:44 AM      Result Value Range   Glucose-Capillary 308 (*) 70 - 99 mg/dL   Comment 1 Notify RN    GLUCOSE, CAPILLARY     Status: Abnormal   Collection Time    03/14/13  4:50 PM      Result Value Range   Glucose-Capillary 255 (*) 70 - 99 mg/dL   Comment 1 Notify RN    GLUCOSE, CAPILLARY     Status: Abnormal   Collection Time    03/14/13  8:46 PM      Result Value Range   Glucose-Capillary 288 (*) 70 - 99 mg/dL   Comment 1 Notify RN    GLUCOSE, CAPILLARY     Status: Abnormal   Collection Time    03/14/13 10:40 PM      Result Value Range   Glucose-Capillary 253 (*) 70 - 99 mg/dL  GLUCOSE, CAPILLARY     Status: Abnormal   Collection Time    03/15/13  7:28 AM  Result Value Range   Glucose-Capillary 241 (*) 70 - 99 mg/dL   Comment 1 Notify RN    GLUCOSE, CAPILLARY     Status: Abnormal   Collection Time    03/15/13 11:45 AM      Result Value Range   Glucose-Capillary 296 (*) 70 - 99 mg/dL   Comment 1 Notify RN    GLUCOSE, CAPILLARY     Status: Abnormal   Collection Time    03/15/13  4:19 PM      Result Value Range   Glucose-Capillary 185 (*) 70 - 99 mg/dL   Comment 1 Notify RN    GLUCOSE, CAPILLARY     Status: Abnormal   Collection Time    03/15/13  8:47 PM      Result Value Range   Glucose-Capillary 185 (*) 70 - 99 mg/dL  GLUCOSE, CAPILLARY     Status: Abnormal   Collection Time    03/16/13  7:33 AM      Result Value Range   Glucose-Capillary 274 (*) 70 - 99 mg/dL   Comment 1 Notify RN       HEENT: normal. No congestion, drainage Cardio: RRR and no Murmur Resp: CTA B/L and unlabored. No wheezes or rhonchi. Chest clear GI: BS positive and non tender Extremity:  Pulses positive and No Edema Skin:   Intact Neuro: Alert/Oriented, Cranial Nerve Abnormalities Left central 7,  , Normal Sensory, Abnormal Motor 3-/5 Left bi, tri, trace grip, trace abd, LLE 4-/5 in KE, Hip Ext 2- ankle  Abnormal FMC Ataxic/ dec FMC and Dysarthric, MAS 1 at elbow, Left fingers 2 Musc/Skel:  Other no pain with L shoulder ROM Gen NAD, alert, pleasant   Assessment/Plan: 1. Functional deficits secondary to R MCA embolic infarct  through PFO which require 3+ hours per day of interdisciplinary therapy in a comprehensive inpatient rehab setting. Physiatrist is providing close team supervision and 24 hour management of active medical problems listed below. Physiatrist and rehab team continue to assess barriers to discharge/monitor patient progress toward functional and medical goals.  FIM: FIM - Bathing Bathing Steps Patient Completed: Chest;Left Arm;Abdomen;Front perineal area;Right upper leg;Left upper leg;Right lower leg (including foot);Left lower leg (including foot) Bathing: 4: Min-Patient completes 8-9 30f 10 parts or 75+ percent  FIM - Upper Body Dressing/Undressing Upper body dressing/undressing steps patient completed: Thread/unthread right sleeve of pullover shirt/dresss;Thread/unthread left sleeve of pullover shirt/dress;Put head through opening of pull over shirt/dress;Pull shirt over trunk Upper body dressing/undressing: 5: Supervision: Safety issues/verbal cues FIM - Lower Body Dressing/Undressing Lower body dressing/undressing steps patient completed: Thread/unthread right underwear leg;Thread/unthread right pants leg;Pull pants up/down;Thread/unthread left underwear leg;Thread/unthread left pants leg;Don/Doff right sock;Don/Doff left sock Lower body dressing/undressing: 4: Min-Patient completed 75 plus % of tasks  FIM - Toileting Toileting steps completed by patient: Adjust clothing prior to toileting;Performs perineal hygiene;Adjust clothing after toileting Toileting Assistive Devices: Grab bar or rail for support Toileting: 5: Supervision: Safety issues/verbal cues  FIM -  Diplomatic Services operational officer Devices: Cane;Grab bars Toilet Transfers: 4-To toilet/BSC: Min A (steadying Pt. > 75%);4-From toilet/BSC: Min A (steadying Pt. > 75%)  FIM - Bed/Chair Transfer Bed/Chair Transfer Assistive Devices: Arm rests;Bed rails;HOB elevated;Cane Bed/Chair Transfer: 5: Supine > Sit: Supervision (verbal cues/safety issues);5: Bed > Chair or W/C: Supervision (verbal cues/safety issues)  FIM - Locomotion: Wheelchair Distance: 150 Locomotion: Wheelchair: 2: Travels 50 - 149 ft with supervision, cueing or coaxing FIM - Locomotion: Ambulation Locomotion: Ambulation Assistive Devices: Occupational hygienist Ambulation/Gait Assistance:  4: Min assist Locomotion: Ambulation: 4: Travels 150 ft or more with minimal assistance (Pt.>75%)  Comprehension Comprehension Mode: Auditory Comprehension: 5-Understands complex 90% of the time/Cues < 10% of the time  Expression Expression Mode: Verbal Expression: 6-Expresses complex ideas: With extra time/assistive device  Social Interaction Social Interaction: 6-Interacts appropriately with others with medication or extra time (anti-anxiety, antidepressant).  Problem Solving Problem Solving: 5-Solves complex 90% of the time/cues < 10% of the time  Memory Memory: 6-Assistive device: No helper  Medical Problem List and Plan:  1. DVT left mid peroneal vein/Anticoagulation: Pharmaceutical: Xarelto  2. Pain Management: prn tylenol effective for pain.  3. Mood: Has good outlook and aware of time needed for recovery.   4. Neuropsych: This patient is capable of making decisions on her own behalf.  5. DM type 2--insulin dependent:uncontrolled On insulin pump at home--titrate levemir further today for basal insulin and use SSI as needed. Po intake is good.  6. Migraines: resumed Topamax and inderal at lower dose to avoid recurrence. Titrate as needed. --controlled for the most part-  -continue to assess 7. Oral pain: improved 8.  Hyperactive airway: resumed Advair 9.  Spasticity LUE flexors, hand splint no meds needed thus far 10.  RLS, change mirapex to .25mg  qhs, MRx1 LOS (Days) 13 A FACE TO FACE EVALUATION WAS PERFORMED  Patricia Black E 03/16/2013, 8:01 AM

## 2013-03-16 NOTE — Progress Notes (Signed)
Inpatient Diabetes Program Recommendations  AACE/ADA: New Consensus Statement on Inpatient Glycemic Control (2013)  Target Ranges:  Prepandial:   less than 140 mg/dL      Peak postprandial:   less than 180 mg/dL (1-2 hours)      Critically ill patients:  140 - 180 mg/dL   Reason for Assessment:  Referral received to assist patient with restart of insulin pump.  Patient is currently on basal/bolus regimen.  Called and discussed with RN.  Asked him to make sure that patient has insulin pump supplies.  Diabetes Coordinator will see patient in AM to assess ability to manage pump and assist with restart.     Thanks, Beryl Meager, RN, BC-ADM Inpatient Diabetes Coordinator Pager [phone]

## 2013-03-17 ENCOUNTER — Inpatient Hospital Stay (HOSPITAL_COMMUNITY): Payer: Managed Care, Other (non HMO) | Admitting: Occupational Therapy

## 2013-03-17 ENCOUNTER — Inpatient Hospital Stay (HOSPITAL_COMMUNITY): Payer: Self-pay | Admitting: Rehabilitation

## 2013-03-17 ENCOUNTER — Inpatient Hospital Stay (HOSPITAL_COMMUNITY): Payer: Managed Care, Other (non HMO) | Admitting: Speech Pathology

## 2013-03-17 LAB — GLUCOSE, CAPILLARY
Glucose-Capillary: 229 mg/dL — ABNORMAL HIGH (ref 70–99)
Glucose-Capillary: 299 mg/dL — ABNORMAL HIGH (ref 70–99)
Glucose-Capillary: 76 mg/dL (ref 70–99)
Glucose-Capillary: 106 mg/dL — ABNORMAL HIGH (ref 70–99)

## 2013-03-17 MED ORDER — MUSCLE RUB 10-15 % EX CREA
TOPICAL_CREAM | Freq: Two times a day (BID) | CUTANEOUS | Status: DC | PRN
  Filled 2013-03-17: qty 85

## 2013-03-17 MED ORDER — INSULIN PUMP
Freq: Three times a day (TID) | SUBCUTANEOUS | Status: DC
  Administered 2013-03-17 – 2013-03-20 (×13): via SUBCUTANEOUS
  Filled 2013-03-17: qty 1

## 2013-03-17 MED ORDER — TROLAMINE SALICYLATE 10 % EX CREA
1.0000 "application " | TOPICAL_CREAM | Freq: Two times a day (BID) | CUTANEOUS | Status: DC | PRN
  Filled 2013-03-17: qty 85

## 2013-03-17 NOTE — Patient Care Conference (Signed)
Inpatient RehabilitationTeam Conference and Plan of Care Update Date: 03/17/2013   Time: 11:30 AM    Patient Name: Patricia Black      Medical Record Number: [id number]  Date of Birth: [birth year] Sex: Female         Room/Bed: 4W22C/4W22C-01 Payor Info: Payor: [employer] / Plan: CIGNA MANAGED / Product Type: *No Product type* /    Admitting Diagnosis: RT CVA  Admit Date/Time:  03/03/2013  8:06 PM Admission Comments: No comment available   Primary Diagnosis:  <principal problem not specified> Principal Problem: <principal problem not specified>  Patient Active Problem List   Diagnosis Date Noted  . PFO (patent foramen ovale) 03/03/2013  . Peroneal DVT (deep venous thrombosis) 03/03/2013  . IMPACT 03/03/2013  . Type II or unspecified type diabetes mellitus without mention of complication, uncontrolled 03/03/2013  . OSA (obstructive sleep apnea) 03/03/2013  . Right shoulder pain 03/03/2013  . CVA (cerebral infarction) 02/24/2013  . CVA (cerebral vascular accident) 02/24/2013  . Hypothermia 02/24/2013  . Hypokalemia 02/24/2013    Expected Discharge Date: Expected Discharge Date: 03/20/13  Team Members Present: Physician leading conference: Dr. Claudette Laws Social Worker Present: Dossie Der, LCSW;Jenny Kelaiah Escalona, LCSW Nurse Present: Other (comment) Phylliss Bob, RN) PT Present: Harriet Butte, PT OT Present: Rosalio Loud, Heath Lark, OT SLP Present: Fae Pippin, SLP PPS Coordinator present : Tora Duck, RN, CRRN;Becky Henrene Dodge, PT     Current Status/Progress Goal Weekly Team Focus  Medical   LUE weakness and increased tone  Maximize neuro recovery  Discharge planning    Bowel/Bladder   Continent of bowel and bladder LBM 12 /15  Remain continent of bowel and bladder.  Monitor any s/s of diarrhea and constipation   Swallow/Nutrition/ Hydration             ADL's   min assist bathing, min assist LB dressing d/t body habitus, supervision UB dressing, min-supervision  transfers  supervision overall, min assist LB dressing  LUE NM re-ed, functional mobility, hemi-technique   Mobility   Pt currently min assist for bed mobility, min/guard to supervision for standing, supervision to min/guard for trnasfers, min assist for gait with LBQC, min assist for stairs  supervision to mod I overall  NMR for LLE/UE, standing balance, gait training, stair training, transfers, bed mobility.    Communication   Mod I   Supervision   goals met   Safety/Cognition/ Behavioral Observations  Supervision-Min assist  Supervision   increase self-monitoring and correcting and organizing; family education    Pain   n/a  n/a  Monitor any onset of pain    Skin   Skin dry and intact.  Free from skin breakdown and infection.       Rehab Goals Patient on target to meet rehab goals: Yes Rehab Goals Revised: None *See Care Plan and progress notes for long and short-term goals.  Barriers to Discharge: min/Sup mobility, shoulder pain limiting movement    Possible Resolutions to Barriers:  pt/family training    Discharge Planning/Teaching Needs:  Pt will be going to her dtr's home in [place] at d/c.  Pt will have 24/7 supervision.  Family is present every day and is willing to go through family education.   Team Discussion:  Pt is doing well medically and team is working on stabilizing pt's blood sugars. Pt is having more difficulty with bed mobility and therapists will be focusing on this until d/c.  Pt is having increased pain in her upper extremities.  Pt is on  track to meet her goals by her d/c date.  Revisions to Treatment Plan:  None   Continued Need for Acute Rehabilitation Level of Care: The patient requires daily medical management by a physician with specialized training in physical medicine and rehabilitation for the following conditions: Daily direction of a multidisciplinary physical rehabilitation program to ensure safe treatment while eliciting the highest outcome that is  of practical value to the patient.: Yes Daily medical management of patient stability for increased activity during participation in an intensive rehabilitation regime.: Yes Daily analysis of laboratory values and/or radiology reports with any subsequent need for medication adjustment of medical intervention for : Neurological problems;Other  Adem Costlow, Vista Deck 03/17/2013, 12:55 PM

## 2013-03-17 NOTE — Progress Notes (Signed)
Inpatient Diabetes Program Recommendations  AACE/ADA: New Consensus Statement on Inpatient Glycemic Control (2013)  Target Ranges:  Prepandial:   less than 140 mg/dL      Peak postprandial:   less than 180 mg/dL (1-2 hours)      Critically ill patients:  140 - 180 mg/dL   Assisted patient with restart of insulin pump.  Pt used new insertion needle and tubing and primed tubing appropriately. Pt inserted the set on her lower right abdomen and used the hospital's Novolog.  Pt able to insert and attach the needle and tubing with some assistance.  She is very capable of contnuing her pump, however she will need someone to help to open the packs and assist with taping the site.  Her basal rates are as follows: 2400-0400 @ 3.6 units/her 0400-1100 @ 3.8 units/hr 1100-2400 @ 3.28 units/hr Total daily dose is 111.88 units/24 hrs.   Bolus doses using her pump wizard: Her meter transmits her blood glucose to the pump for appropriate bolus dose which she activates She enters her carbs for her food coverage.  New cartridge and insulin and infusion set will need to be changed out by Saturday if no occlusions or malfunctions occur. Glad to assist. Thank you, Lenor Coffin, RN, CNS, Diabetes Coordinator [phone])

## 2013-03-17 NOTE — Progress Notes (Signed)
Physical Therapy Session Note  Patient Details  Name: Patricia Black MRN: [id number] Date of Birth: 07/09/1950  Today's Date: 03/17/2013 Time: 2130-8657 Time Calculation (min): 45 min  Short Term Goals: Week 2:  PT Short Term Goal 1 (Week 2): Pt will be supervision with Transfers (bed<>chair) and demonstrating safe technique.  PT Short Term Goal 2 (Week 2): Pt will be able to ambulate 100 feet using LRAD at min A and min cues with decreased knee hyperextension.  PT Short Term Goal 3 (Week 2): Pt will be able to negotiate up/down 8 steps using a right railing with min A and min cues.  PT Short Term Goal 4 (Week 2): Pt will maintain dynamic standing balance at min assist during sessions.   Skilled Therapeutic Interventions/Progress Updates:   Pt received sitting in w/c in room, agreeable to therapy this am.  Sister and mother present in room and discussed family education/training in next couple of days prior to D/C home for safety.  Focus of session was gait training with [facility] for increased quality and safety, stair negotiation and also transfers mat <> chair to address safety with technique.  See details below for gait and stairs.  Ended session with transfers mat <> arm chair to simulate home chairs for increased safety and technique.  Able to complete 4 transfers at supervision with cues for turning all the way to seating surface prior to standing.  Pt returned to room to sit in recliner with all needs in reach.   Therapy Documentation Precautions:  Precautions Precautions: Fall Precaution Comments: Impulsive, L hemiplegia, possible left visual field deficit Restrictions Weight Bearing Restrictions: No   Vital Signs: Therapy Vitals Pulse Rate: 69 BP: 130/69 mmHg Oxygen Therapy O2 Device: None (Room air) Pain: Pt continues to c/o pain in B shoulders from overuse and also some soreness in L knee.  Recommend Tylenol for pain, however she states that she likes "feeling the pain" to know  "its working."     Locomotion : Ambulation Ambulation: Yes Ambulation/Gait Assistance: 4: Min guard;5: Supervision Ambulation Distance (Feet): 155 Feet (x 2 reps) Assistive device: Large base quad cane Ambulation/Gait Assistance Details: Verbal cues for sequencing;Verbal cues for precautions/safety;Verbal cues for technique;Verbal cues for gait pattern;Verbal cues for safe use of DME/AE Ambulation/Gait Assistance Details: Continue to work on gait training with [facility] for improved quality.  she was able to ambulate 150' x 2 with continued cues for increased step length on LLE, increased step width, and performing heel to toe with L stance phase of gait.  Pt is mostly supervision, however she did require min assist in one instance due to decreased foot clearance on LLE.   Gait Gait: Yes Gait Pattern: Impaired Gait Pattern: Decreased step length - left;Decreased dorsiflexion - left;Decreased weight shift to right;Decreased stance time - left;Step-to pattern;Decreased weight shift to left;Trunk flexed;Narrow base of support;Trendelenburg Stairs / Additional Locomotion Stairs: Yes Stairs Assistance: 4: Min assist Stairs Assistance Details: Tactile cues for sequencing;Tactile cues for weight shifting;Tactile cues for placement;Verbal cues for technique;Verbal cues for precautions/safety;Manual facilitation for weight shifting Stairs Assistance Details (indicate cue type and reason): Ascended/descended 11 stairs today in order to simulate home environment to get to pts room in basement.  Used R handrail and requires min assist for steadying/ safety with mod cues for sequencing/technique.  Pt feels that she descends better/safer with RLE than LLE.  Requires min assist with either technique, therefore will allow pt to decide what feels comfortable.   Stair Management Technique: One  rail Right;Step to pattern;Forwards Number of Stairs: 11 Height of Stairs: 6   See FIM for current functional  status  Therapy/Group: Individual Therapy  Vista Deck 03/17/2013, 11:48 AM

## 2013-03-17 NOTE — Progress Notes (Signed)
62 y.o. right handed female history of migraine headaches as well as diabetes mellitus (insulin pump) who was admitted 02/24/2013 with left-sided weakness,dizziness as well as fall on her back porch-- found by [facility] when family was unable to get in touch with her. 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 MRI /MRA brain showed acute infarcts in the right basal ganglia and posterior right MCA territory with associated edema and occluded R-MCA beyond origin. Echocardiogram with ejection fraction 70% grade 2 diastolic dysfunction. [name]. Neurology recommended ASA for embolic infarct due to occlusion of R-MCA artery and TEE ordered for workup. TEE revealed PFO and BLE dopplers were positive for L-mid peroneal vein DVT. On Xarelto  Subjective/Complaints: No shoulder pain Review of Systems - Negative except weakness Left arm   Objective: Vital Signs: Blood pressure 109/58, pulse 61, temperature 97 F (36.1 C), temperature source Oral, resp. rate 17, height 5\' 7"  (1.702 m), weight 117.9 kg (259 lb 14.8 oz), SpO2 96.00%. No results found. Results for orders placed during the hospital encounter of 03/03/13 (from the past 72 hour(s))  GLUCOSE, CAPILLARY     Status: Abnormal   Collection Time    03/14/13 11:44 AM      Result Value Range   Glucose-Capillary 308 (*) 70 - 99 mg/dL   Comment 1 Notify RN    GLUCOSE, CAPILLARY     Status: Abnormal   Collection Time    03/14/13  4:50 PM      Result Value Range   Glucose-Capillary 255 (*) 70 - 99 mg/dL   Comment 1 Notify RN    GLUCOSE, CAPILLARY     Status: Abnormal   Collection Time    03/14/13  8:46 PM      Result Value Range   Glucose-Capillary 288 (*) 70 - 99 mg/dL   Comment 1 Notify RN    GLUCOSE, CAPILLARY     Status: Abnormal   Collection Time    03/14/13 10:40 PM      Result Value Range   Glucose-Capillary 253 (*) 70 - 99 mg/dL  GLUCOSE, CAPILLARY     Status:  Abnormal   Collection Time    03/15/13  7:28 AM      Result Value Range   Glucose-Capillary 241 (*) 70 - 99 mg/dL   Comment 1 Notify RN    GLUCOSE, CAPILLARY     Status: Abnormal   Collection Time    03/15/13 11:45 AM      Result Value Range   Glucose-Capillary 296 (*) 70 - 99 mg/dL   Comment 1 Notify RN    GLUCOSE, CAPILLARY     Status: Abnormal   Collection Time    03/15/13  4:19 PM      Result Value Range   Glucose-Capillary 185 (*) 70 - 99 mg/dL   Comment 1 Notify RN    GLUCOSE, CAPILLARY     Status: Abnormal   Collection Time    03/15/13  8:47 PM      Result Value Range   Glucose-Capillary 185 (*) 70 - 99 mg/dL  GLUCOSE, CAPILLARY     Status: Abnormal   Collection Time    03/16/13  7:33 AM      Result Value Range   Glucose-Capillary 274 (*) 70 - 99 mg/dL   Comment 1 Notify RN    GLUCOSE, CAPILLARY     Status: Abnormal   Collection Time  03/16/13 11:27 AM      Result Value Range   Glucose-Capillary 315 (*) 70 - 99 mg/dL   Comment 1 Notify RN    GLUCOSE, CAPILLARY     Status: Abnormal   Collection Time    03/16/13  4:18 PM      Result Value Range   Glucose-Capillary 263 (*) 70 - 99 mg/dL   Comment 1 Notify RN    GLUCOSE, CAPILLARY     Status: Abnormal   Collection Time    03/16/13  9:07 PM      Result Value Range   Glucose-Capillary 145 (*) 70 - 99 mg/dL  GLUCOSE, CAPILLARY     Status: Abnormal   Collection Time    03/17/13  7:30 AM      Result Value Range   Glucose-Capillary 229 (*) 70 - 99 mg/dL   Comment 1 Notify RN       HEENT: normal. No congestion, drainage Cardio: RRR and no Murmur Resp: CTA B/L and unlabored. No wheezes or rhonchi. Chest clear GI: BS positive and non tender Extremity:  Pulses positive and No Edema Skin:   Intact Neuro: Alert/Oriented, Cranial Nerve Abnormalities Left central 7, , Normal Sensory, Abnormal Motor 3-/5 Left bi, tri, trace grip, trace abd, LLE 4-/5 in KE, Hip Ext 2- ankle  Abnormal FMC Ataxic/ dec FMC and  Dysarthric, MAS 1 at elbow, Left fingers 2 Musc/Skel:  Other no pain with L shoulder ROM Gen NAD, alert, pleasant   Assessment/Plan: 1. Functional deficits secondary to R MCA embolic infarct  through PFO which require 3+ hours per day of interdisciplinary therapy in a comprehensive inpatient rehab setting. Physiatrist is providing close team supervision and 24 hour management of active medical problems listed below. Physiatrist and rehab team continue to assess barriers to discharge/monitor patient progress toward functional and medical goals. Team conference today please see physician documentation under team conference tab, met with team face-to-face to discuss problems,progress, and goals. Formulized individual treatment plan based on medical history, underlying problem and comorbidities. FIM: FIM - Bathing Bathing Steps Patient Completed: Chest;Left Arm;Abdomen;Front perineal area;Right upper leg;Left upper leg;Buttocks Bathing: 4: Min-Patient completes 8-9 75f 10 parts or 75+ percent  FIM - Upper Body Dressing/Undressing Upper body dressing/undressing steps patient completed: Thread/unthread right sleeve of pullover shirt/dresss;Thread/unthread left sleeve of pullover shirt/dress;Put head through opening of pull over shirt/dress;Pull shirt over trunk Upper body dressing/undressing: 5: Supervision: Safety issues/verbal cues FIM - Lower Body Dressing/Undressing Lower body dressing/undressing steps patient completed: Thread/unthread right underwear leg;Thread/unthread right pants leg;Pull pants up/down;Thread/unthread left underwear leg;Thread/unthread left pants leg;Don/Doff right sock;Don/Doff left sock Lower body dressing/undressing: 4: Min-Patient completed 75 plus % of tasks  FIM - Toileting Toileting steps completed by patient: Adjust clothing prior to toileting;Performs perineal hygiene Toileting Assistive Devices: Grab bar or rail for support Toileting: 5: Supervision: Safety  issues/verbal cues  FIM - Diplomatic Services operational officer Devices: Cane;Grab bars Toilet Transfers: 4-To toilet/BSC: Min A (steadying Pt. > 75%);4-From toilet/BSC: Min A (steadying Pt. > 75%)  FIM - Bed/Chair Transfer Bed/Chair Transfer Assistive Devices: Arm rests;Bed rails;HOB elevated;Cane Bed/Chair Transfer: 5: Bed > Chair or W/C: Supervision (verbal cues/safety issues) (mat to w/c)  FIM - Locomotion: Wheelchair Distance: 155 Locomotion: Wheelchair: 5: Travels 150 ft or more: maneuvers on rugs and over door sills with supervision, cueing or coaxing FIM - Locomotion: Ambulation Locomotion: Ambulation Assistive Devices: Occupational hygienist Ambulation/Gait Assistance: 4: Min guard Locomotion: Ambulation: 4: Travels 150 ft or more with minimal  assistance (Pt.>75%)  Comprehension Comprehension Mode: Auditory Comprehension: 5-Understands complex 90% of the time/Cues < 10% of the time  Expression Expression Mode: Verbal Expression: 6-Expresses complex ideas: With extra time/assistive device  Social Interaction Social Interaction: 6-Interacts appropriately with others with medication or extra time (anti-anxiety, antidepressant).  Problem Solving Problem Solving: 5-Solves basic problems: With no assist  Memory Memory: 6-Assistive device: No helper  Medical Problem List and Plan:  1. DVT left mid peroneal vein/Anticoagulation: Pharmaceutical: Xarelto  2. Pain Management: prn tylenol effective for pain.  3. Mood: Has good outlook and aware of time needed for recovery.   4. Neuropsych: This patient is capable of making decisions on her own behalf.  5. DM type 2--insulin dependent:uncontrolled On insulin pump at home--titrate levemir further today for basal insulin and use SSI as needed. Po intake is good.  6. Migraines: resumed Topamax and inderal at lower dose to avoid recurrence. Titrate as needed. --controlled for the most part-  -continue to assess 7. Oral pain: improved 8.  Hyperactive airway: resumed Advair 9.  Spasticity LUE flexors, hand splint no meds needed thus far 10.  RLS, change mirapex to .25mg  qhs, MRx1 LOS (Days) 14 A FACE TO FACE EVALUATION WAS PERFORMED  Patricia Black E 03/17/2013, 8:15 AM

## 2013-03-17 NOTE — Progress Notes (Signed)
Speech Language Pathology Daily Session Note  Patient Details  Name: Patricia Black MRN: [id number] Date of Birth: [birth year]  Today's Date: 03/17/2013 Time: 4098-1191 Time Calculation (min): 40 min  Short Term Goals: Week 2: SLP Short Term Goal 1 (Week 2): Patient will initiate, sequence and solve moderately complex problems with Supervision level verbal cues SLP Short Term Goal 2 (Week 2): Patient will self-monitor and correct errors with Min question cues  Skilled Therapeutic Interventions: Skilled treatment session focused on addressing cognitive goals. SLP facilitated session with new learning, alternating attention task that patient completed with increased wait time.  Patient continues to demonstrate difficulty with divided attention.  Mother and sister present for session and SLP provided vebral education regardng need for Supervision with medication and money management tasks to self-monitior and correct errors.  Continue wtih current plan of care.      FIM:  Comprehension Comprehension Mode: Auditory Comprehension: 6-Follows complex conversation/direction: With extra time/assistive device Expression Expression Mode: Verbal Expression: 6-Expresses complex ideas: With extra time/assistive device Social Interaction Social Interaction: 6-Interacts appropriately with others with medication or extra time (anti-anxiety, antidepressant). Problem Solving Problem Solving: 5-Solves basic problems: With no assist Memory Memory: 6-Assistive device: No helper  Pain Pain Assessment Pain Assessment: No/denies pain  Therapy/Group: Individual Therapy  [name], CCC-SLP [phone]  Hayward Rylander 03/17/2013, 12:14 PM

## 2013-03-17 NOTE — Progress Notes (Signed)
Occupational Therapy Session Note  Patient Details  Name: Patricia Black MRN: [id number] Date of Birth: March 05, 1951  Today's Date: 03/17/2013 Time: 0730-0826 and 7829-5621 Time Calculation (min): 56 min and 50 min  Short Term Goals: Week 2:  OT Short Term Goal 1 (Week 2): STG = LTGs due to remaining LOS  Skilled Therapeutic Interventions/Progress Updates:    1) Pt seen for ADL retraining with focus on bed mobility, functional ambulation with LBQC, and hemi-technique with bathing and dressing. Pt required min assist supine to sit with use of bed rails secondary to pain in RUE limiting ability to complete bed mobility without assist this session.  Discussed bed mobility upon d/c as pt having difficulty at this time even with bed rails, discussed option of bed rails at home and plan to focus more on bed mobility from flat bed during treatment sessions. Pt ambulated to dresser with close supervision to gather clothing prior to bathing and dressing, pt with no LOB when bending down to bottom drawer to retrieve items. Engaged in bathing and dressing at sit> stand level in shower with pt with increased Lt shoulder movement with UB bathing and demonstrating appropriate use of AE. Pt continues to require min assist to pull underwear over Lt hip secondary to LUE weakness and body habitus, but was successful with pants this session.  Hand over hand to incorporate Lt hand into grasping and pulling pants up.  Discussed use of small amount of baby powder to assist with pulling pants over Lt hip.    2) Pt seen for 1:1 OT with focus on LUE self-ROM, NM re-ed, and functional mobility in home environment.  Pt in recliner upon arrival, performed sit> stand with supervision with increased time and multiple attempts secondary to fatigue and increased time seated in one position.  Ambulated in ADL apt with close supervision with [facility] over carpeted surfaces and engaged in bed mobility with discussion regarding appropriate  side of bed to increase safety and independence with supine to sit.  Pt supervision supine to sit rolling to pt's Rt.  Engaged in Bioness NMES with focus on finger flexion/extension with attempts to grasp cup.  Pt with appropriate finger flexion and extension on exercise mode with pt reporting pleased with seeing fingers move.  Post NMES pt with increased finger flexion with gross grasp.  Discussed self-ROM and provided handout of LUE with focus on wrist and digit exercises.    Therapy Documentation Precautions:  Precautions Precautions: Fall Precaution Comments: Impulsive, L hemiplegia, possible left visual field deficit Restrictions Weight Bearing Restrictions: No Vital Signs: Oxygen Therapy O2 Device: None (Room air) Pain:  Pt with c/o pain in Rt shoulder from overuse, denied meds  See FIM for current functional status  Therapy/Group: Individual Therapy  Rosalio Loud 03/17/2013, 8:49 AM

## 2013-03-17 NOTE — Progress Notes (Signed)
Patient has a home CPAP machine that she uses as she likes. Patient is aware to call if she does need any help with the machine. RT will continue to assist as needed.

## 2013-03-18 ENCOUNTER — Inpatient Hospital Stay (HOSPITAL_COMMUNITY): Payer: Managed Care, Other (non HMO) | Admitting: Occupational Therapy

## 2013-03-18 ENCOUNTER — Inpatient Hospital Stay (HOSPITAL_COMMUNITY): Payer: Self-pay | Admitting: Rehabilitation

## 2013-03-18 ENCOUNTER — Inpatient Hospital Stay (HOSPITAL_COMMUNITY): Payer: Managed Care, Other (non HMO) | Admitting: Speech Pathology

## 2013-03-18 DIAGNOSIS — E1165 Type 2 diabetes mellitus with hyperglycemia: Secondary | ICD-10-CM

## 2013-03-18 DIAGNOSIS — G8110 Spastic hemiplegia affecting unspecified side: Secondary | ICD-10-CM

## 2013-03-18 DIAGNOSIS — I6330 Cerebral infarction due to thrombosis of unspecified cerebral artery: Secondary | ICD-10-CM

## 2013-03-18 LAB — GLUCOSE, CAPILLARY
Glucose-Capillary: 46 mg/dL — ABNORMAL LOW (ref 70–99)
Glucose-Capillary: 80 mg/dL (ref 70–99)
Glucose-Capillary: 132 mg/dL — ABNORMAL HIGH (ref 70–99)
Glucose-Capillary: 233 mg/dL — ABNORMAL HIGH (ref 70–99)
Glucose-Capillary: 322 mg/dL — ABNORMAL HIGH (ref 70–99)
Glucose-Capillary: 193 mg/dL — ABNORMAL HIGH (ref 70–99)
Glucose-Capillary: 127 mg/dL — ABNORMAL HIGH (ref 70–99)

## 2013-03-18 NOTE — Progress Notes (Signed)
Occupational Therapy Session Note  Patient Details  Name: Patricia Black MRN: [id number] Date of Birth: Apr 20, 1950  Today's Date: 03/18/2013 Time: 4098-1191 Time Calculation (min): 57 min  Short Term Goals: Week 2:  OT Short Term Goal 1 (Week 2): STG = LTGs due to remaining LOS  Skilled Therapeutic Interventions/Progress Updates:    Pt seen for ADL retraining with focus on bed mobility, functional ambulation with LBQC, and hemi-technique with bathing and dressing. Pt completed supine to sidelying and sidelying to sit to Rt side to get out of flat bed without any physical assist of verbal cues this session. Pt ambulated to dresser with close supervision to gather clothing prior to bathing and dressing, pt with no LOB when bending down to bottom drawer to retrieve items. Engaged in bathing and dressing at sink with pt with increased Lt shoulder movement with UB bathing however unable to incorporate finger flexion to assist with bathing or dressing. Pt continues to require min assist to pull pants over Lt hip secondary to LUE weakness and body habitus, despite increased time and effort.  Pt reports ability to pull up pants sometimes from toilet but that is still inconsistent.  Discussed self-ROM handout and modifying shoulder flexion exercise to having sister/daughter provide additional manual facilitation to achieve full ROM against gravity or in gravity eliminated position.  Therapy Documentation Precautions:  Precautions Precautions: Fall Precaution Comments: Impulsive, L hemiplegia, possible left visual field deficit Restrictions Weight Bearing Restrictions: No General:   Vital Signs: Therapy Vitals Temp: 98 F (36.7 C) Temp src: Oral Pulse Rate: 66 Resp: 18 BP: 111/48 mmHg Patient Position, if appropriate: Lying Oxygen Therapy SpO2: 94 % O2 Device: None (Room air) Pain: Pain Assessment Pain Score: 4  Pain Type: Acute pain Pain Location: Arm Pain Orientation:  Right;Left Pain Descriptors / Indicators: Aching Pain Intervention(s): Medication (See eMAR)  See FIM for current functional status  Therapy/Group: Individual Therapy  Rosalio Loud 03/18/2013, 9:16 AM

## 2013-03-18 NOTE — Progress Notes (Signed)
62 y.o. right handed female history of migraine headaches as well as diabetes mellitus (insulin pump) who was admitted 02/24/2013 with left-sided weakness,dizziness as well as fall on her back porch-- found by [facility] when family was unable to get in touch with her. 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 MRI /MRA brain showed acute infarcts in the right basal ganglia and posterior right MCA territory with associated edema and occluded R-MCA beyond origin. Echocardiogram with ejection fraction 70% grade 2 diastolic dysfunction. [name]. Neurology recommended ASA for embolic infarct due to occlusion of R-MCA artery and TEE ordered for workup. TEE revealed PFO and BLE dopplers were positive for L-mid peroneal vein DVT. On Xarelto  Subjective/Complaints: No shoulder pain Low CBG after restarting Pump Review of Systems - Negative except weakness Left arm   Objective: Vital Signs: Blood pressure 111/48, pulse 66, temperature 98 F (36.7 C), temperature source Oral, resp. rate 18, height 5\' 7"  (1.702 m), weight 115 kg (253 lb 8.5 oz), SpO2 94.00%. No results found. Results for orders placed during the hospital encounter of 03/03/13 (from the past 72 hour(s))  GLUCOSE, CAPILLARY     Status: Abnormal   Collection Time    03/15/13 11:45 AM      Result Value Range   Glucose-Capillary 296 (*) 70 - 99 mg/dL   Comment 1 Notify RN    GLUCOSE, CAPILLARY     Status: Abnormal   Collection Time    03/15/13  4:19 PM      Result Value Range   Glucose-Capillary 185 (*) 70 - 99 mg/dL   Comment 1 Notify RN    GLUCOSE, CAPILLARY     Status: Abnormal   Collection Time    03/15/13  8:47 PM      Result Value Range   Glucose-Capillary 185 (*) 70 - 99 mg/dL  GLUCOSE, CAPILLARY     Status: Abnormal   Collection Time    03/16/13  7:33 AM      Result Value Range   Glucose-Capillary 274 (*) 70 - 99 mg/dL   Comment 1 Notify RN    GLUCOSE,  CAPILLARY     Status: Abnormal   Collection Time    03/16/13 11:27 AM      Result Value Range   Glucose-Capillary 315 (*) 70 - 99 mg/dL   Comment 1 Notify RN    GLUCOSE, CAPILLARY     Status: Abnormal   Collection Time    03/16/13  4:18 PM      Result Value Range   Glucose-Capillary 263 (*) 70 - 99 mg/dL   Comment 1 Notify RN    GLUCOSE, CAPILLARY     Status: Abnormal   Collection Time    03/16/13  9:07 PM      Result Value Range   Glucose-Capillary 145 (*) 70 - 99 mg/dL  GLUCOSE, CAPILLARY     Status: Abnormal   Collection Time    03/17/13  7:30 AM      Result Value Range   Glucose-Capillary 229 (*) 70 - 99 mg/dL   Comment 1 Notify RN    GLUCOSE, CAPILLARY     Status: Abnormal   Collection Time    03/17/13 11:39 AM      Result Value Range   Glucose-Capillary 299 (*) 70 - 99 mg/dL  GLUCOSE, CAPILLARY     Status: None   Collection Time    03/17/13  5:13  PM      Result Value Range   Glucose-Capillary 76  70 - 99 mg/dL   Comment 1 Notify RN    GLUCOSE, CAPILLARY     Status: Abnormal   Collection Time    03/17/13  8:49 PM      Result Value Range   Glucose-Capillary 106 (*) 70 - 99 mg/dL   Comment 1 Notify RN    GLUCOSE, CAPILLARY     Status: Abnormal   Collection Time    03/18/13  2:20 AM      Result Value Range   Glucose-Capillary 46 (*) 70 - 99 mg/dL   Comment 1 Notify RN    GLUCOSE, CAPILLARY     Status: Abnormal   Collection Time    03/18/13  2:52 AM      Result Value Range   Glucose-Capillary 53 (*) 70 - 99 mg/dL   Comment 1 Notify RN    GLUCOSE, CAPILLARY     Status: None   Collection Time    03/18/13  3:18 AM      Result Value Range   Glucose-Capillary 80  70 - 99 mg/dL   Comment 1 Notify RN    GLUCOSE, CAPILLARY     Status: Abnormal   Collection Time    03/18/13  3:45 AM      Result Value Range   Glucose-Capillary 132 (*) 70 - 99 mg/dL   Comment 1 Notify RN    GLUCOSE, CAPILLARY     Status: Abnormal   Collection Time    03/18/13  7:12 AM       Result Value Range   Glucose-Capillary 233 (*) 70 - 99 mg/dL   Comment 1 Notify RN       HEENT: normal. No congestion, drainage Cardio: RRR and no Murmur Resp: CTA B/L and unlabored. No wheezes or rhonchi. Chest clear GI: BS positive and non tender Extremity:  Pulses positive and No Edema Skin:   Intact Neuro: Alert/Oriented, Cranial Nerve Abnormalities Left central 7, , Normal Sensory, Abnormal Motor 3-/5 Left bi, tri, trace grip, trace abd, LLE 4-/5 in KE, Hip Ext 2- ankle  Abnormal FMC Ataxic/ dec FMC and Dysarthric, MAS 1 at elbow, Left fingers 2 Musc/Skel:  Other no pain with L shoulder ROM Gen NAD, alert, pleasant   Assessment/Plan: 1. Functional deficits secondary to R MCA embolic infarct  through PFO which require 3+ hours per day of interdisciplinary therapy in a comprehensive inpatient rehab setting. Physiatrist is providing close team supervision and 24 hour management of active medical problems listed below. Physiatrist and rehab team continue to assess barriers to discharge/monitor patient progress toward functional and medical goals.  FIM: FIM - Bathing Bathing Steps Patient Completed: Chest;Left Arm;Abdomen;Front perineal area;Right upper leg;Left upper leg;Buttocks;Right Arm;Right lower leg (including foot);Left lower leg (including foot) Bathing: 5: Supervision: Safety issues/verbal cues  FIM - Upper Body Dressing/Undressing Upper body dressing/undressing steps patient completed: Thread/unthread right sleeve of pullover shirt/dresss;Thread/unthread left sleeve of pullover shirt/dress;Put head through opening of pull over shirt/dress;Pull shirt over trunk Upper body dressing/undressing: 5: Supervision: Safety issues/verbal cues FIM - Lower Body Dressing/Undressing Lower body dressing/undressing steps patient completed: Thread/unthread right underwear leg;Thread/unthread left underwear leg;Thread/unthread right pants leg;Thread/unthread left pants leg;Pull pants  up/down;Don/Doff right sock;Don/Doff left sock Lower body dressing/undressing: 4: Min-Patient completed 75 plus % of tasks  FIM - Toileting Toileting steps completed by patient: Performs perineal hygiene;Adjust clothing after toileting Toileting Assistive Devices: Grab bar or rail for support Toileting: 5:  Supervision: Safety issues/verbal cues  FIM - Archivist Transfers Assistive Devices: Cane;Grab bars Toilet Transfers: 5-From toilet/BSC: Supervision (verbal cues/safety issues);5-To toilet/BSC: Supervision (verbal cues/safety issues)  FIM - Press photographer Assistive Devices: Arm rests Bed/Chair Transfer: 5: Bed > Chair or W/C: Supervision (verbal cues/safety issues);5: Chair or W/C > Bed: Supervision (verbal cues/safety issues)  FIM - Locomotion: Wheelchair Distance: 155 Locomotion: Wheelchair: 5: Travels 150 ft or more: maneuvers on rugs and over door sills with supervision, cueing or coaxing FIM - Locomotion: Ambulation Locomotion: Ambulation Assistive Devices: Occupational hygienist Ambulation/Gait Assistance: 4: Min guard;5: Supervision Locomotion: Ambulation: 4: Travels 150 ft or more with minimal assistance (Pt.>75%)  Comprehension Comprehension Mode: Auditory Comprehension: 6-Follows complex conversation/direction: With extra time/assistive device  Expression Expression Mode: Verbal Expression: 6-Expresses complex ideas: With extra time/assistive device  Social Interaction Social Interaction: 6-Interacts appropriately with others with medication or extra time (anti-anxiety, antidepressant).  Problem Solving Problem Solving: 5-Solves basic problems: With no assist  Memory Memory: 6-Assistive device: No helper  Medical Problem List and Plan:  1. DVT left mid peroneal vein/Anticoagulation: Pharmaceutical: Xarelto  2. Pain Management: prn tylenol effective for pain.  3. Mood: Has good outlook and aware of time needed for recovery.   4.  Neuropsych: This patient is capable of making decisions on her own behalf.  5. DM type 2--insulin dependent:uncontrolled On insulin pump at home--D/C levemir back on pump with help of diabetes coordinator 6. Migraines: resumed Topamax and inderal at lower dose to avoid recurrence. Titrate as needed. --controlled for the most part-  -continue to assess 7. Oral pain: improved 8. Hyperactive airway: resumed Advair 9.  Spasticity LUE flexors, hand splint no meds needed thus far 10.  RLS, change mirapex to .25mg  qhs, MRx1 LOS (Days) 15 A FACE TO FACE EVALUATION WAS PERFORMED  Patricia Black E 03/18/2013, 8:17 AM

## 2013-03-18 NOTE — Progress Notes (Signed)
Physical Therapy Session Note  Patient Details  Name: Patricia Black MRN: [id number] Date of Birth: 1950-12-11  Today's Date: 03/18/2013 Time: 0930-1026 Time Calculation (min): 56 min  Short Term Goals: Week 2:  PT Short Term Goal 1 (Week 2): Pt will be supervision with Transfers (bed<>chair) and demonstrating safe technique.  PT Short Term Goal 2 (Week 2): Pt will be able to ambulate 100 feet using LRAD at min A and min cues with decreased knee hyperextension.  PT Short Term Goal 3 (Week 2): Pt will be able to negotiate up/down 8 steps using a right railing with min A and min cues.  PT Short Term Goal 4 (Week 2): Pt will maintain dynamic standing balance at min assist during sessions.   Skilled Therapeutic Interventions/Progress Updates:   Pt received sitting in w/c in room.  Assisted with donning socks and shoes to ensure enough time during session.  Pt self propelled x 155' to gym with BLEs only in order to increased LE strength and work on increased step length on LLE to carryover to gait.  Pt able to propel at supervision level, however requires cues at times to avoid objects on the L and to continue to propel, even during conversation with PT to work on divided attention.  Had pt propel into ADL apt to simulate home environment, set up w/c and perform stand pivot with [facility] at supervision level to ADL bed (again to better simulate home environment).  Performed all aspects of bed mobility at supervision level with min cues for safety. Had pt ambulate around ADL apt (carpeted) to address increased step length on LLE.  Also performed sit <> stand from low love seat in ADL apt at supervision level with min cues to scoot completely to edge of surface prior to standing.  Ambulated to ortho gym in order to perform car transfer to simulate daughters car.  She was able to perform at supervision level with mod cues initially as she attempted to step LE in first rather than sitting and then elevating LEs  into car.  Ambulated back to gym to perform seated nustep x 10 mins at level 3 resistance with BLEs only to increase NMR through LLE.  Min cues for increased step speed.  Pt able to ambulate back towards room, however required seated rest break and self propelled as mentioned above.  Pt left in w/c in room with all needs in reach.  See details below for gait training.   Therapy Documentation Precautions:  Precautions Precautions: Fall Precaution Comments: Impulsive, L hemiplegia, possible left visual field deficit Restrictions Weight Bearing Restrictions: No   Pain: Pain Assessment Pain Score: 4  Pain Type: Acute pain Pain Location: Arm Pain Orientation: Right;Left Pain Descriptors / Indicators: Aching Pain Intervention(s): Medication (See eMAR)   Locomotion : Ambulation Ambulation: Yes Ambulation/Gait Assistance: 5: Supervision Ambulation Distance (Feet): 155 Feet (then another 100') Assistive device: Large base quad cane Ambulation/Gait Assistance Details: Verbal cues for sequencing;Verbal cues for precautions/safety;Verbal cues for technique;Verbal cues for gait pattern;Verbal cues for safe use of DME/AE Ambulation/Gait Assistance Details: Continue to work on gait training with [facility] for improved gait quality and also to work on ambulation in controlled and uncontrolled environment to address safety with divided attention.  Pt able to ambulate at supervision today, even in busier environment and while maintaining coversation with therapist.   Gait Gait: Yes Gait Pattern: Impaired Gait Pattern: Decreased step length - left;Decreased dorsiflexion - left;Decreased weight shift to right;Decreased stance time - left;Step-to  pattern;Decreased weight shift to left;Trendelenburg Wheelchair Mobility Distance: 150   See FIM for current functional status  Therapy/Group: Individual Therapy  Vista Deck 03/18/2013, 10:30 AM

## 2013-03-18 NOTE — Significant Event (Signed)
Hypoglycemic Event  CBG:46  Treatment: 15 GM carbohydrate snack  Symptoms: None  Follow-up CBG: Time:0252 CBG Result:53  Possible Reasons for Event: Other: patient started on insulin pump today, used insulin pump prior to admission  Comments/MD notified:   Bonnee Quin  Remember to initiate Hypoglycemia Order Set & complete

## 2013-03-18 NOTE — Significant Event (Signed)
Hypoglycemic Event  CBG: 53  Treatment: 15 GM carbohydrate snack  Symptoms: None  Follow-up CBG: Time:0318 CBG Result:80  Possible Reasons for Event: Other: insulin pump initiated 03/17/2013.(patient used insulin pump at home)  Comments/MD notified: Dan,A, PA 0320 am. Given order to place insulin pump on hold till morning.    Bonnee Quin  Remember to initiate Hypoglycemia Order Set & complete

## 2013-03-18 NOTE — Progress Notes (Signed)
Speech Language Pathology Daily Session Note  Patient Details  Name: Shellie Rogoff MRN: [id number] Date of Birth: 20-Nov-1950  Today's Date: 03/18/2013 Time: 1030-1105 Time Calculation (min): 35 min  Short Term Goals: Week 2: SLP Short Term Goal 1 (Week 2): Patient will initiate, sequence and solve moderately complex problems with Supervision level verbal cues SLP Short Term Goal 2 (Week 2): Patient will self-monitor and correct errors with Min question cues  Skilled Therapeutic Interventions: Skilled treatment session focused on addressing cognitive goals. SLP facilitated session with medication management task that patient completed with increased wait time to utilize external aid for recall and Min faded to Supervision level verbal cues.  Mother and sister present for session and observed continued high level cognitive deficits and how to appropriately provide Supervision with complex tasks.  SLP discussed follow up in the outpatient setting and patient in agreement.  Continue with current plan of care.      FIM:  Comprehension Comprehension Mode: Auditory Comprehension: 6-Follows complex conversation/direction: With extra time/assistive device Expression Expression Mode: Verbal Expression: 6-Expresses complex ideas: With extra time/assistive device Social Interaction Social Interaction: 6-Interacts appropriately with others with medication or extra time (anti-anxiety, antidepressant). Problem Solving Problem Solving: 5-Solves complex 90% of the time/cues < 10% of the time Memory Memory: 6-Assistive device: No helper  Pain Pain Assessment Pain Assessment: No/denies pain  Therapy/Group: Individual Therapy  [name], CCC-SLP [phone]  Marquette Piontek 03/18/2013, 4:28 PM

## 2013-03-18 NOTE — Progress Notes (Signed)
Physical Therapy Session Note  Patient Details  Name: Patricia Black MRN: [id number] Date of Birth: September 07, 1950  Today's Date: 03/18/2013 Time: 4098-1191 Time Calculation (min): 43 min  Short Term Goals: Week 2:  PT Short Term Goal 1 (Week 2): Pt will be supervision with Transfers (bed<>chair) and demonstrating safe technique.  PT Short Term Goal 2 (Week 2): Pt will be able to ambulate 100 feet using LRAD at min A and min cues with decreased knee hyperextension.  PT Short Term Goal 3 (Week 2): Pt will be able to negotiate up/down 8 steps using a right railing with min A and min cues.  PT Short Term Goal 4 (Week 2): Pt will maintain dynamic standing balance at min assist during sessions.   Skilled Therapeutic Interventions/Progress Updates:   Pt received sitting in w/c in room, agreeable to therapy this afternoon.  Ambulated to gym with [facility] to continue to address gait quality and divided attention in busy environment.  See details below.  Also worked on high level balance/gait with side stepping and backwards walking.  Also see details below.  Performed 10 reps total (5 at increased height and 5 reps at lower height) sit <> stand with small pad under RLE to increase WB through LLE when standing.  Also had pt placed R UE over LUE on LLE to stand to further increase WB when standing.  Pt able to perform at supervision level, however noted increased difficulty from lower surface and with increased fatigue.  Pt ambulated approx 50' back towards room, however require seated rest break and used BLEs to self propel back to room.  Continue to note slight inattention to object in hallway to the left, esp when making turns.  Pt returned to room and transferred back to recliner at supervision.  All needs in reach.   Therapy Documentation Precautions:  Precautions Precautions: Fall Precaution Comments: Impulsive, L hemiplegia, possible left visual field deficit Restrictions Weight Bearing Restrictions:  No   Pain:   Locomotion : Ambulation Ambulation: Yes Ambulation/Gait Assistance: 5: Supervision Ambulation Distance (Feet): 85 Feet (155) Assistive device: Large base quad cane Ambulation/Gait Assistance Details: Verbal cues for sequencing;Verbal cues for precautions/safety;Verbal cues for technique;Verbal cues for gait pattern;Verbal cues for safe use of DME/AE Ambulation/Gait Assistance Details: Continue to work on gait training with [facility] for improved gait quality and also to work on ambulation in controlled and uncontrolled environment to address safety with divided attention.  Pt able to ambulate at supervision today, even in busier environment and while maintaining coversation with therapist.   Gait Gait: Yes Gait Pattern: Impaired Gait Pattern: Decreased step length - left;Decreased dorsiflexion - left;Decreased weight shift to right;Decreased stance time - left;Step-to pattern;Decreased weight shift to left High Level Ambulation High Level Ambulation: Side stepping;Backwards walking Side Stepping: Performed side stepping R and L in order to increase weight shift and WB to LLE at min assist with therapist facing pt supporting BUE.  Did note some decreased glute activation when side stepping to the R, however did better going back to the left.  Backwards Walking: Performed backwards walking to work on high level balance, clearance of LLE and to increase hip extension mm.  Requires min to mod assist at times for LOB due to scissored gait pattern.  Again, provided assist in front of pt supporting BUEs.   Wheelchair Mobility Distance: 150  See FIM for current functional status  Therapy/Group: Individual Therapy  Vista Deck 03/18/2013, 1:46 PM

## 2013-03-18 NOTE — Progress Notes (Signed)
Patient insulin pump initiated 12/17. At 0220 blood glucose 46, given a carb snack, rechecked 53. Given another carb snack and rechecked blood glucose 80. Notified Dan A, PA given order to place insulin pump on hold for now. Will continue to monitor patient. nfc

## 2013-03-18 NOTE — Progress Notes (Signed)
Social Work Patient ID: Patricia Black, female   DOB: 1951/02/08, 62 y.o.   MRN: [id number]  CSW met with pt and her sister and mother to update them on team conference discussion.  Team feels pt is doing well and they continue to work to stabilize pt's diabetes.  Talked with pt about her having more difficulty getting out of bed and she did say she's been a little sore in her upper body.  Explained that pt continues to be on track to meet her goals by d/c on 03-20-13.  Pt is going to need some DME and some outpatient therapies.  Explained that CSW will arrange these.  Also spoke with pt's dtr who has found pt a primary MD and has located a couple of rehab centers in her area in [place].  Dtr also wondered about pt traveling 3 hours away on 03-24-13 to see family and spend the night.  CSW will follow up with PA about if this is something pt can do.  Pt's family will receive family education on Friday.

## 2013-03-18 NOTE — Progress Notes (Signed)
Notified by RN this am of hypoglycemia in the early am today. Hypoglycemia most likely due to the am dose of Levemir yesterday prior to restarting her pump yesterday afternoon. Although all other insulin orders were discontinued, the Levemir most probably was still active in her body once the pump was restarted. Pt suspended her pump at that time and was treated for hypoglycemia.  She did not want to restart her pump until she was assured that there were no other insulins to be given to her beyond the pump. She would like to continue using her pump and definitely does not want any insulin given in addition. At this point, the levemir given yesterday at [phone] should be cleared from her body and the insulin pump is the only active insulin used at this time. CBG at 12 noon as 322 mg/dL for which she covered with her pump's calculated bolus correction. Will follow patient closely and happy to assist in any way. Thank you, Lenor Coffin, RN, CNS, Diabetes Coordinator [phone])

## 2013-03-19 ENCOUNTER — Inpatient Hospital Stay (HOSPITAL_COMMUNITY): Payer: Managed Care, Other (non HMO) | Admitting: Speech Pathology

## 2013-03-19 ENCOUNTER — Inpatient Hospital Stay (HOSPITAL_COMMUNITY): Payer: Self-pay | Admitting: Rehabilitation

## 2013-03-19 ENCOUNTER — Inpatient Hospital Stay (HOSPITAL_COMMUNITY): Payer: Managed Care, Other (non HMO)

## 2013-03-19 LAB — GLUCOSE, CAPILLARY
Glucose-Capillary: 128 mg/dL — ABNORMAL HIGH (ref 70–99)
Glucose-Capillary: 89 mg/dL (ref 70–99)
Glucose-Capillary: 202 mg/dL — ABNORMAL HIGH (ref 70–99)
Glucose-Capillary: 244 mg/dL — ABNORMAL HIGH (ref 70–99)

## 2013-03-19 MED ORDER — GUAIFENESIN ER 600 MG PO TB12: 600.0000 mg | ORAL_TABLET | Freq: Two times a day (BID) | ORAL | Status: AC

## 2013-03-19 MED ORDER — RIVAROXABAN 20 MG PO TABS: 20.0000 mg | ORAL_TABLET | Freq: Every day | ORAL | Status: AC

## 2013-03-19 MED ORDER — IPRATROPIUM-ALBUTEROL 20-100 MCG/ACT IN AERS: 1.0000 | INHALATION_SPRAY | Freq: Four times a day (QID) | RESPIRATORY_TRACT | Status: AC | PRN

## 2013-03-19 MED ORDER — PRAMIPEXOLE DIHYDROCHLORIDE 0.25 MG PO TABS: 0.2500 mg | ORAL_TABLET | Freq: Every evening | ORAL | Status: AC | PRN

## 2013-03-19 MED ORDER — TOPIRAMATE ER 100 MG PO CAP24: 100.0000 mg | ORAL_CAPSULE | Freq: Every day | ORAL | Status: AC

## 2013-03-19 MED ORDER — RIVAROXABAN 15 MG PO TABS: 15.0000 mg | ORAL_TABLET | Freq: Two times a day (BID) | ORAL | Status: AC

## 2013-03-19 MED ORDER — MOMETASONE FURO-FORMOTEROL FUM 100-5 MCG/ACT IN AERO: 2.0000 | INHALATION_SPRAY | Freq: Two times a day (BID) | RESPIRATORY_TRACT | Status: AC

## 2013-03-19 MED ORDER — INSULIN PUMP: 1.0000 | Freq: Three times a day (TID) | SUBCUTANEOUS | Status: AC

## 2013-03-19 MED ORDER — PROPRANOLOL HCL ER 60 MG PO CP24: ORAL_CAPSULE | ORAL | Status: AC

## 2013-03-19 MED ORDER — LORATADINE 10 MG PO TABS: 10.0000 mg | ORAL_TABLET | Freq: Every day | ORAL | Status: AC

## 2013-03-19 NOTE — Progress Notes (Signed)
62 y.o. right handed female history of migraine headaches as well as diabetes mellitus (insulin pump) who was admitted 02/24/2013 with left-sided weakness,dizziness as well as fall on her back porch-- found by [facility] when family was unable to get in touch with her. 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 MRI /MRA brain showed acute infarcts in the right basal ganglia and posterior right MCA territory with associated edema and occluded R-MCA beyond origin. Echocardiogram with ejection fraction 70% grade 2 diastolic dysfunction. [name]. Neurology recommended ASA for embolic infarct due to occlusion of R-MCA artery and TEE ordered for workup. TEE revealed PFO and BLE dopplers were positive for L-mid peroneal vein DVT. On Xarelto  Subjective/Complaints: No shoulder pain Low CBG after restarting Pump Review of Systems - Negative except weakness Left arm   Objective: Vital Signs: Blood pressure 107/53, pulse 64, temperature 97.5 F (36.4 C), temperature source Oral, resp. rate 18, height 5\' 7"  (1.702 m), weight 115 kg (253 lb 8.5 oz), SpO2 98.00%. No results found. Results for orders placed during the hospital encounter of 03/03/13 (from the past 72 hour(s))  GLUCOSE, CAPILLARY     Status: Abnormal   Collection Time    03/16/13  7:33 AM      Result Value Range   Glucose-Capillary 274 (*) 70 - 99 mg/dL   Comment 1 Notify RN    GLUCOSE, CAPILLARY     Status: Abnormal   Collection Time    03/16/13 11:27 AM      Result Value Range   Glucose-Capillary 315 (*) 70 - 99 mg/dL   Comment 1 Notify RN    GLUCOSE, CAPILLARY     Status: Abnormal   Collection Time    03/16/13  4:18 PM      Result Value Range   Glucose-Capillary 263 (*) 70 - 99 mg/dL   Comment 1 Notify RN    GLUCOSE, CAPILLARY     Status: Abnormal   Collection Time    03/16/13  9:07 PM      Result Value Range   Glucose-Capillary 145 (*) 70 - 99 mg/dL   GLUCOSE, CAPILLARY     Status: Abnormal   Collection Time    03/17/13  7:30 AM      Result Value Range   Glucose-Capillary 229 (*) 70 - 99 mg/dL   Comment 1 Notify RN    GLUCOSE, CAPILLARY     Status: Abnormal   Collection Time    03/17/13 11:39 AM      Result Value Range   Glucose-Capillary 299 (*) 70 - 99 mg/dL  GLUCOSE, CAPILLARY     Status: None   Collection Time    03/17/13  5:13 PM      Result Value Range   Glucose-Capillary 76  70 - 99 mg/dL   Comment 1 Notify RN    GLUCOSE, CAPILLARY     Status: Abnormal   Collection Time    03/17/13  8:49 PM      Result Value Range   Glucose-Capillary 106 (*) 70 - 99 mg/dL   Comment 1 Notify RN    GLUCOSE, CAPILLARY     Status: Abnormal   Collection Time    03/18/13  2:20 AM      Result Value Range   Glucose-Capillary 46 (*) 70 - 99 mg/dL   Comment 1 Notify RN    GLUCOSE, CAPILLARY     Status: Abnormal  Collection Time    03/18/13  2:52 AM      Result Value Range   Glucose-Capillary 53 (*) 70 - 99 mg/dL   Comment 1 Notify RN    GLUCOSE, CAPILLARY     Status: None   Collection Time    03/18/13  3:18 AM      Result Value Range   Glucose-Capillary 80  70 - 99 mg/dL   Comment 1 Notify RN    GLUCOSE, CAPILLARY     Status: Abnormal   Collection Time    03/18/13  3:45 AM      Result Value Range   Glucose-Capillary 132 (*) 70 - 99 mg/dL   Comment 1 Notify RN    GLUCOSE, CAPILLARY     Status: Abnormal   Collection Time    03/18/13  7:12 AM      Result Value Range   Glucose-Capillary 233 (*) 70 - 99 mg/dL   Comment 1 Notify RN    GLUCOSE, CAPILLARY     Status: Abnormal   Collection Time    03/18/13  7:22 AM      Result Value Range   Glucose-Capillary 256 (*) 70 - 99 mg/dL   Comment 1 Notify RN    GLUCOSE, CAPILLARY     Status: Abnormal   Collection Time    03/18/13 11:31 AM      Result Value Range   Glucose-Capillary 322 (*) 70 - 99 mg/dL   Comment 1 Notify RN    GLUCOSE, CAPILLARY     Status: Abnormal   Collection  Time    03/18/13  4:28 PM      Result Value Range   Glucose-Capillary 193 (*) 70 - 99 mg/dL   Comment 1 Notify RN    GLUCOSE, CAPILLARY     Status: Abnormal   Collection Time    03/18/13  9:18 PM      Result Value Range   Glucose-Capillary 127 (*) 70 - 99 mg/dL  GLUCOSE, CAPILLARY     Status: Abnormal   Collection Time    03/19/13  1:56 AM      Result Value Range   Glucose-Capillary 128 (*) 70 - 99 mg/dL  GLUCOSE, CAPILLARY     Status: None   Collection Time    03/19/13  7:04 AM      Result Value Range   Glucose-Capillary 89  70 - 99 mg/dL     HEENT: normal. No congestion, drainage Cardio: RRR and no Murmur Resp: CTA B/L and unlabored. No wheezes or rhonchi. Chest clear GI: BS positive and non tender Extremity:  Pulses positive and No Edema Skin:   Intact Neuro: Alert/Oriented, Cranial Nerve Abnormalities Left central 7, , Normal Sensory, Abnormal Motor 3-/5 Left bi, tri, trace grip, trace abd, LLE 4-/5 in KE, Hip Ext 2- ankle  Abnormal FMC Ataxic/ dec FMC and Dysarthric, MAS 1 at elbow, Left fingers 2 Musc/Skel:  Other no pain with L shoulder ROM Gen NAD, alert, pleasant   Assessment/Plan: 1. Functional deficits secondary to R MCA embolic infarct  through PFO which require 3+ hours per day of interdisciplinary therapy in a comprehensive inpatient rehab setting. Physiatrist is providing close team supervision and 24 hour management of active medical problems listed below. Physiatrist and rehab team continue to assess barriers to discharge/monitor patient progress toward functional and medical goals. Ready for D/C in am FIM: FIM - Bathing Bathing Steps Patient Completed: Chest;Left Arm;Abdomen;Front perineal area;Right upper leg;Left upper leg;Right lower leg (  including foot);Left lower leg (including foot) Bathing: 4: Min-Patient completes 8-9 79f 10 parts or 75+ percent  FIM - Upper Body Dressing/Undressing Upper body dressing/undressing steps patient completed:  Thread/unthread right sleeve of pullover shirt/dresss;Thread/unthread left sleeve of pullover shirt/dress;Put head through opening of pull over shirt/dress;Pull shirt over trunk Upper body dressing/undressing: 5: Supervision: Safety issues/verbal cues FIM - Lower Body Dressing/Undressing Lower body dressing/undressing steps patient completed: Thread/unthread right underwear leg;Thread/unthread left underwear leg;Thread/unthread right pants leg;Thread/unthread left pants leg;Don/Doff right sock;Don/Doff left sock Lower body dressing/undressing: 4: Min-Patient completed 75 plus % of tasks  FIM - Toileting Toileting steps completed by patient: Performs perineal hygiene;Adjust clothing after toileting Toileting Assistive Devices: Grab bar or rail for support Toileting: 5: Supervision: Safety issues/verbal cues  FIM - Diplomatic Services operational officer Devices: Cane;Grab bars Toilet Transfers: 5-To toilet/BSC: Supervision (verbal cues/safety issues);5-From toilet/BSC: Supervision (verbal cues/safety issues)  FIM - Press photographer Assistive Devices: Arm rests Bed/Chair Transfer: 5: Supine > Sit: Supervision (verbal cues/safety issues);5: Sit > Supine: Supervision (verbal cues/safety issues);5: Bed > Chair or W/C: Supervision (verbal cues/safety issues);5: Chair or W/C > Bed: Supervision (verbal cues/safety issues)  FIM - Locomotion: Wheelchair Distance: 150 Locomotion: Wheelchair: 5: Travels 150 ft or more: maneuvers on rugs and over door sills with supervision, cueing or coaxing FIM - Locomotion: Ambulation Locomotion: Ambulation Assistive Devices: Occupational hygienist Ambulation/Gait Assistance: 5: Supervision Locomotion: Ambulation: 5: Travels 150 ft or more with supervision/safety issues  Comprehension Comprehension Mode: Auditory Comprehension: 6-Follows complex conversation/direction: With extra time/assistive device  Expression Expression Mode:  Verbal Expression: 6-Expresses complex ideas: With extra time/assistive device  Social Interaction Social Interaction: 6-Interacts appropriately with others with medication or extra time (anti-anxiety, antidepressant).  Problem Solving Problem Solving: 5-Solves complex 90% of the time/cues < 10% of the time  Memory Memory: 6-Assistive device: No helper  Medical Problem List and Plan:  1. DVT left mid peroneal vein/Anticoagulation: Pharmaceutical: Xarelto  2. Pain Management: prn tylenol effective for pain.  3. Mood: Has good outlook and aware of time needed for recovery.   4. Neuropsych: This patient is capable of making decisions on her own behalf.  5. DM type 2--insulin dependent:uncontrolled On insulin pump at home-- back on pump with help of diabetes coordinator 6. Migraines: resumed Topamax and inderal . Titrate as needed. --controlled for the most part-  7. Oral pain: resolved 8. Hyperactive airway: resumed Advair 9.  Spasticity LUE flexors, hand splint no meds needed thus far 10.  RLS, change mirapex to .25mg  qhs, MRx1, slept well LOS (Days) 16 A FACE TO FACE EVALUATION WAS PERFORMED  Pasty Manninen E 03/19/2013, 7:29 AM

## 2013-03-19 NOTE — Plan of Care (Signed)
Problem: RH BLADDER ELIMINATION Goal: RH STG MANAGE BLADDER WITH ASSISTANCE STG Manage Bladder With mod Assistance  Outcome: Completed/Met Date Met:  03/19/13 Supervision

## 2013-03-19 NOTE — Progress Notes (Signed)
Social Work Patient ID: Patricia Black, female   DOB: [birth year], 62 y.o.   MRN: [id number] Pt aware wheelchair to be switched out with right one-20x18, currently pt has a 22x18 wheelchair which is too large for her. Therapy information faxed to OP therapy for them to arrange an appointments.

## 2013-03-19 NOTE — Progress Notes (Signed)
Physical Therapy Discharge Summary  Patient Details  Name: Patricia Black MRN: [id number] Date of Birth: January 07, 1951  Today's Date: 03/19/2013 Time: 4098-1191 and 1303 to 1348 Time Calculation (min): 59 min and 45 mins  Patient has met 7 of 7 long term goals due to improved activity tolerance, improved balance, improved postural control, increased strength, ability to compensate for deficits, functional use of  left upper extremity and left lower extremity, improved attention, improved awareness and improved coordination.  Patient to discharge at an ambulatory level Supervision for household distances and supervision at w/c level for longer community distances.   Patient's care partner is independent to provide the necessary cognitive assistance at discharge.  Reasons goals not met: Pt has met all goals and will continue to benefit from further therapies to address deficits mentioned below.   Recommendation:  Patient will benefit from ongoing skilled PT services in outpatient setting to continue to advance safe functional mobility, address ongoing impairments in dynamic standing balance, mild L inattention, decreased divided attention, decreased strength in LLE, decreased functional use of LUE, gait abnormality, and minimize fall risk.  Equipment: LBQC, w/c, w/c cushion  Reasons for discharge: treatment goals met and discharge from hospital  Patient/family agrees with progress made and goals achieved: Yes  PT Treatment/Intervention: AM session:  Pt received sitting in w/c in room, agreeable to therapy this morning.  Note that her equipment has arrived, therefore transitioned her into new w/c and adjusted LBQC.  She then stated she needed to use the restroom due to stomach feeling upset.  She was able to void bowel movement and assisted with placing brief to prevent possible accident/incontinence.  Pt self propelled x 155' to gym using BLEs at supervision level.  Sister and mother present  during session to participate in family training/education.  Performed 11, 6" steps forwards step to pattern with R handrail with sister assisting pt at hips as educated by this clinician.  Pt able to correctly verbalize pattern/sequencing.  Also provided demonstration and education about placement when pt descending stairs.  Pt returned to chair and was assisted to ortho gym in order to perform car transfer.  She was able to perform at supervision level.  Min cues for hand placement and education to family on correct technique.  Ended session with ambulation/gait training with sister supervising in controlled and carpeted surface x 100' and educating sister on providing intermittent cues for increased step length on LLE, esp on carpet.  Performed bed mobility in ADL apt at supervision level, and bed to/from chair at supervision level.   Assisted pt back to room and left in w/c with all needs in reach.  All family verbalized and returned demonstration safely.    PM session:  Pt received sitting in w/c in room.  Focus of session was gait training to/from gym for improved quality while maintaining conversation to address increased divided attention.  Pt continues to be able to ambulate at supervision level with min cues for increased step length on LLE, esp when fatigued or distracted.  Once in gym, performed BERG balance test with score of 31/56, placing pt at significant risk for fall.  Discussed results with pt and reinforced need for 24/7 supervision at this time.  Pt verbalized agreement.  See BERG details below. Pt returned to room and sat in arm chair.  All needs in reach with family present also.    PT Discharge Precautions/Restrictions Precautions Precautions: Fall Precaution Comments: L hemi paresis, slight L inattention, impulsive Restrictions Weight  Bearing Restrictions: No   Pain Pain Assessment Pain Score: 3  Pain Type: Acute pain Pain Location: Hand Pain Orientation: Right Pain  Descriptors / Indicators: Aching Patients Stated Pain Goal: 2 Pain Intervention(s): Medication (See eMAR) Vision/Perception  Vision - History Baseline Vision: Wears glasses all the time Visual History: Cataracts Patient Visual Report: No change from baseline Vision - Assessment Eye Alignment: Within Functional Limits Tracking/Visual Pursuits: Decreased smoothness of vertical tracking Visual Fields: Left visual field deficit Additional Comments: Decreased visual scanning to L Perception Perception: Within Functional Limits Praxis Praxis: Intact  Cognition Overall Cognitive Status: Impaired/Different from baseline Arousal/Alertness: Awake/alert Orientation Level: Oriented X4 Attention: Divided;Focused;Sustained;Selective;Alternating Focused Attention: Appears intact Sustained Attention: Appears intact Selective Attention: Appears intact Alternating Attention: Appears intact Divided Attention: Impaired Divided Attention Impairment: Functional basic Memory: Impaired Memory Impairment: Decreased recall of new information Safety/Judgment: Impaired Comments: Pt continues to demonstrate slight impulsiveness and unsafe behaviors as she has been seen on two occasions OOB standing on her own with only family present in room.  Continue to provide education to pt and family about having staff with her when up OOB.  Sensation Sensation Light Touch: Appears Intact Stereognosis: Not tested Hot/Cold: Not tested Proprioception: Appears Intact Coordination Gross Motor Movements are Fluid and Coordinated: No Fine Motor Movements are Fluid and Coordinated: No Coordination and Movement Description: Pt continues to have decreased fluidity of movement, however is much improved from day of evaluation.  Heel Shin Test: see above Motor  Motor Motor: Hemiplegia Motor - Discharge Observations: Pt with weakness in LLE, but functional vs time of evaluation, however continues to have decreased  functional use of LUE.   Mobility Bed Mobility Bed Mobility: Right Sidelying to Sit;Rolling Right;Sit to Sidelying Right Rolling Right: 5: Supervision Rolling Right Details: Verbal cues for precautions/safety Right Sidelying to Sit: 5: Supervision Right Sidelying to Sit Details: Verbal cues for precautions/safety;Verbal cues for technique Sit to Supine: 5: Supervision Sit to Supine - Details: Verbal cues for precautions/safety Transfers Transfers: Yes Sit to Stand: 5: Supervision;With armrests;From chair/3-in-1 Sit to Stand Details: Verbal cues for sequencing;Verbal cues for technique;Verbal cues for precautions/safety Stand to Sit: 5: Supervision;With upper extremity assist;To chair/3-in-1 Stand to Sit Details (indicate cue type and reason): Verbal cues for technique;Verbal cues for sequencing;Verbal cues for precautions/safety Stand Pivot Transfers: 5: Supervision Stand Pivot Transfer Details: Verbal cues for sequencing;Verbal cues for technique;Verbal cues for precautions/safety Locomotion  Ambulation Ambulation: Yes Ambulation/Gait Assistance: 5: Supervision Ambulation Distance (Feet): 150 Feet Assistive device: Large base quad cane Ambulation/Gait Assistance Details: Verbal cues for sequencing;Verbal cues for precautions/safety;Verbal cues for technique;Verbal cues for gait pattern;Verbal cues for safe use of DME/AE Gait Gait: Yes Gait Pattern: Impaired Gait Pattern: Decreased step length - left;Decreased dorsiflexion - left;Decreased weight shift to right;Decreased stance time - left;Step-to pattern;Decreased weight shift to left Stairs / Additional Locomotion Stairs: Yes Stairs Assistance: 4: Min assist Stairs Assistance Details: Tactile cues for sequencing;Tactile cues for weight shifting;Tactile cues for placement;Verbal cues for technique;Verbal cues for precautions/safety;Manual facilitation for weight shifting Stair Management Technique: One rail Right;Step to  pattern;Forwards Number of Stairs: 11 Height of Stairs: 6 Corporate treasurer: Yes Wheelchair Assistance: 5: Financial planner Details: Verbal cues for technique;Verbal cues for sequencing;Verbal cues for precautions/safety Wheelchair Propulsion: Right upper extremity;Both lower extermities Wheelchair Parts Management: Supervision/cueing Distance: 155  Trunk/Postural Assessment  Cervical Assessment Cervical Assessment: Within Functional Limits Thoracic Assessment Thoracic Assessment: Within Functional Limits Lumbar Assessment Lumbar Assessment: Within Functional Limits Postural Control Trunk Control: Pt  continues to demonstrate posterior pelvic tilt in sitting with forward head, rounded shoulders.  Also note pelvis rotated slightly to R in standing,   Balance Balance Balance Assessed: Yes Dynamic Sitting Balance Dynamic Sitting - Balance Support: No upper extremity supported Dynamic Sitting - Level of Assistance: 6: Modified independent (Device/Increase time) Static Standing Balance Static Standing - Level of Assistance: 5: Stand by assistance Dynamic Standing Balance Dynamic Standing - Level of Assistance: 5: Stand by assistance Extremity Assessment  RUE Assessment RUE Assessment: Within Functional Limits LUE Assessment LUE Assessment: Exceptions to [facility] LUE Strength LUE Overall Strength Comments: Pt has 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 PROM WFL all joints. Pt has been provided self-ROM handout.  RLE Assessment RLE Assessment: Within Functional Limits LLE Assessment LLE Assessment: Exceptions to [facility] LLE Strength LLE Overall Strength: Deficits LLE Overall Strength Comments: hip flex 3+/5, knee ext 4/5, knee flex 5/5, ankle DF 4/5, ankle PF 5/5  See FIM for current functional status  Vista Deck 03/19/2013, 1:52 PM

## 2013-03-19 NOTE — Progress Notes (Signed)
Patient has a home CPAP unit. She said she would not need any help with it. RT instructed her to call if she needed anything. RT will continue to monitor.

## 2013-03-19 NOTE — Progress Notes (Signed)
Speech Language Pathology Daily Session Note & Discharge Summary  Patient Details  Name: Patricia Black MRN: [id number] Date of Birth: 09/29/1950  Today's Date: 03/19/2013 Time: 7829-5621 Time Calculation (min): 45 min  Short Term Goals: Week 2: SLP Short Term Goal 1 (Week 2): Patient will initiate, sequence and solve moderately complex problems with Supervision level verbal cues SLP Short Term Goal 2 (Week 2): Patient will self-monitor and correct errors with Min question cues  Skilled Therapeutic Interventions: Skilled treatment session focused on addressing cognitive goals and wrap up of education.  SLP facilitated session with moderately complex financial management task that patient completed with increased wait time and visual cue x1 to organize and sequence completion of multi-step task.  SLP re-enforced need to Supervision with complex tasks and patient in agreement.  Patient has been planning for discharge with creating lists to assist with recalling important information Mod I. Patient and family ready for discharge; recommend to continue skilled SLP services at next level of care to maximize functional independence.        FIM:  Comprehension Comprehension Mode: Auditory Comprehension: 6-Follows complex conversation/direction: With extra time/assistive device Expression Expression Mode: Verbal Expression: 6-Expresses complex ideas: With extra time/assistive device Social Interaction Social Interaction: 6-Interacts appropriately with others with medication or extra time (anti-anxiety, antidepressant). Problem Solving Problem Solving: 5-Solves complex 90% of the time/cues < 10% of the time Memory Memory: 6-Assistive device: No helper  Pain Pain Assessment Pain Assessment: No/denies pain  Therapy/Group: Individual Therapy   Speech Language Pathology Discharge Summary  Patient Details  Name: Patricia Black MRN: [id number] Date of Birth: [birth year]  Today's Date:  03/19/2013  Patient has met 4 of 4 long term goals.  Patient to discharge at overall Supervision level.  Reasons goals not met: n/a   Clinical Impression/Discharge Summary: Patient has met 4 out of 4 long term goals during her CIR stay and as a result, progressed from Mod assist to Supervision assist with manamgnet of complex tasks.  She has made gains in her ability to recall and perform oral motor exercises as well as ability to recall and utilize external speech intelligibility strategies with Mod I.    Care Partner:  Caregiver Able to Provide Assistance: Yes  Type of Caregiver Assistance: Cognitive  Recommendation:  Outpatient SLP;24 hour supervision/assistance  Rationale for SLP Follow Up: Maximize cognitive function and independence;Reduce caregiver burden   Equipment: None  Reasons for discharge: Treatment goals met;Discharged from hospital   Patient/Family Agrees with Progress Made and Goals Achieved: Yes   See FIM for current functional status  [name], CCC-SLP [phone]  [name] 03/19/2013, 1:53 PM

## 2013-03-19 NOTE — Progress Notes (Signed)
Orthopedic Tech Progress Note Patient Details:  Patricia Black [birth year] [id number]  Patient ID: Patricia Black, female   DOB: April 16, 1950, 62 y.o.   MRN: [id number]   [name] 03/19/2013, 1:24 PMCalled advanced for left foot orthosis.

## 2013-03-19 NOTE — Progress Notes (Signed)
Occupational Therapy Discharge Summary  Patient Details  Name: Patricia Black MRN: [id number] Date of Birth: April 09, 1950  Today's Date: 03/19/2013 Time: 4098-1191 Time Calculation (min): 60 min  Patient has met 14 of 14 long term goals due to improved activity tolerance, improved balance, postural control, ability to compensate for deficits, functional use of  LEFT upper and LEFT lower extremity, improved attention and improved coordination.  Patient to discharge at overall supervision and min assist for LB dressing  level.  Patient's care partner is independent to provide the necessary physical and cognitive assistance at discharge.    Reasons goals not met: N/A. All LTGs met  Recommendation:  Patient will benefit from ongoing skilled OT services in outpatient setting to continue to advance functional skills in the area of BADL.  Equipment: shower chair  Reasons for discharge: treatment goals met and discharge from hospital  Patient/family agrees with progress made and goals achieved: Yes  Skilled Therapeutic Intervention Pt seen for ADL retraining with focus on dynamic balance, functional use of LUE, awareness, and activity tolerance. Pt ambulated throughout room with [facility] at supervision level to retrieve all clothing items. Completed all transfers at supervision level and min cues. Pt located items on left side during self-care task with less cues. Pt used bath mitt on LUE for forced use of LUE and to increase ability to wash RUE. Pt required 3-4 rest breaks d/t fatigue and increased time for all self-care tasks. At end of session pt left sitting in w/c with all needs in reach. Pt reports no questions or concerns about discharge at this time. Therapist discussed completing self-ROM exercises at home and pt agreeable and located handout.   OT Discharge Precautions/Restrictions  Precautions Precautions: Fall Precaution Comments: L hemi paresis, slight L inattention,  impulsive Restrictions Weight Bearing Restrictions: No General   Vital Signs   Pain Pain Assessment Pain Score: 3  ADL   Vision/Perception  Vision - History Baseline Vision: Wears glasses all the time Visual History: Cataracts Patient Visual Report: No change from baseline Vision - Assessment Eye Alignment: Within Functional Limits Tracking/Visual Pursuits: Decreased smoothness of vertical tracking Visual Fields: Left visual field deficit Additional Comments: Decreased visual scanning to L Perception Perception: Within Functional Limits Praxis Praxis: Intact  Cognition Overall Cognitive Status: Impaired/Different from baseline Arousal/Alertness: Awake/alert Orientation Level: Oriented X4 Attention: Divided;Focused;Sustained;Selective;Alternating Focused Attention: Appears intact Sustained Attention: Appears intact Selective Attention: Appears intact Alternating Attention: Appears intact Divided Attention: Impaired Memory: Impaired Memory Impairment: Decreased recall of new information Safety/Judgment: Impaired Sensation Sensation Light Touch: Appears Intact Coordination Gross Motor Movements are Fluid and Coordinated: No Fine Motor Movements are Fluid and Coordinated: No Motor    Mobility     Trunk/Postural Assessment     Balance Balance Balance Assessed: Yes Dynamic Sitting Balance Dynamic Sitting - Balance Support: No upper extremity supported Dynamic Sitting - Level of Assistance: 6: Modified independent (Device/Increase time) Static Standing Balance Static Standing - Level of Assistance: 5: Stand by assistance Dynamic Standing Balance Dynamic Standing - Level of Assistance: 5: Stand by assistance Extremity/Trunk Assessment RUE Assessment RUE Assessment: Within Functional Limits LUE Assessment LUE Assessment: Exceptions to [facility] LUE Strength LUE Overall Strength Comments: Pt has 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 PROM WFL all joints. Pt  has been provided self-ROM handout.   See FIM for current functional status  Daneil Dan 03/19/2013, 12:24 PM

## 2013-03-20 DIAGNOSIS — I6350 Cerebral infarction due to unspecified occlusion or stenosis of unspecified cerebral artery: Secondary | ICD-10-CM

## 2013-03-20 DIAGNOSIS — E876 Hypokalemia: Secondary | ICD-10-CM

## 2013-03-20 LAB — GLUCOSE, CAPILLARY
Glucose-Capillary: 174 mg/dL — ABNORMAL HIGH (ref 70–99)
Glucose-Capillary: 266 mg/dL — ABNORMAL HIGH (ref 70–99)

## 2013-03-20 NOTE — Progress Notes (Addendum)
Planning to go home today  Patricia Black is a 62 y.o. female 23-Aug-1950 [id number]  Subjective: No new complaints. No new problems. Slept well. Feeling OK.  Objective: Vital signs in last 24 hours: Temp:  [97.6 F (36.4 C)-97.9 F (36.6 C)] 97.6 F (36.4 C) (12/20 0527) Pulse Rate:  [57-62] 62 (12/20 0527) Resp:  [18-20] 20 (12/20 0527) BP: (112-128)/(40-76) 128/40 mmHg (12/20 0527) SpO2:  [97 %-98 %] 98 % (12/20 0527) Weight change:  Last BM Date: 03/19/13  Intake/Output from previous day: 12/19 0701 - 12/20 0700 In: 480 [P.O.:480] Out: 1 [Urine:1] Last cbgs: CBG (last 3)   Recent Labs  03/19/13 2040 03/20/13 0155 03/20/13 0650  GLUCAP 244* 174* 266*     Physical Exam General: No apparent distress    HEENT: moist mucosa Lungs: Normal effort. Lungs clear to auscultation, no crackles or wheezes. Cardiovascular: Regular rate and rhythm, no edema Abdomen: S/NT/ND; BS(+) Musculoskeletal:  No change from before Neurological: No new neurological deficits Wounds: N/A    Skin: clear Alert, cooperative   Lab Results: BMET    Component Value Date/Time   NA 136 03/04/2013 0500   K 4.1 03/04/2013 0500   CL 100 03/04/2013 0500   CO2 24 03/04/2013 0500   GLUCOSE 314* 03/04/2013 0500   BUN 12 03/04/2013 0500   CREATININE 0.78 03/04/2013 0500   CALCIUM 8.9 03/04/2013 0500   GFRNONAA 88* 03/04/2013 0500   GFRAA >90 03/04/2013 0500   CBC    Component Value Date/Time   WBC 7.4 03/04/2013 0500   RBC 4.00 03/04/2013 0500   HGB 11.4* 03/04/2013 0500   HCT 34.1* 03/04/2013 0500   PLT 206 03/04/2013 0500   MCV 85.3 03/04/2013 0500   MCH 28.5 03/04/2013 0500   MCHC 33.4 03/04/2013 0500   RDW 14.5 03/04/2013 0500   LYMPHSABS 1.5 03/04/2013 0500   MONOABS 0.6 03/04/2013 0500   EOSABS 0.2 03/04/2013 0500   BASOSABS 0.0 03/04/2013 0500    Studies/Results: No results found.  Medications: I have reviewed the patient's current medications.  Assessment/Plan:      Length of stay,  days: 17  Sonda Primes , MD 03/20/2013, 7:42 AM        Assessment/Plan: 1. Functional deficits secondary to R MCA embolic infarct  through PFO which require 3+ hours per day of interdisciplinary therapy in a comprehensive inpatient rehab setting. Physiatrist is providing close team supervision and 24 hour management of active medical problems listed below. Physiatrist and rehab team continue to assess barriers to discharge/monitor patient progress toward functional and medical goals. Ready for D/C in am  Medical Problem List and Plan:  1. DVT left mid peroneal vein/Anticoagulation: Pharmaceutical: Xarelto  2. Pain Management: prn tylenol effective for pain.  3. Mood: Has good outlook and aware of time needed for recovery.   4. Neuropsych: This patient is capable of making decisions on her own behalf.  5. DM type 2--insulin dependent:uncontrolled On insulin pump at home-- back on pump with help of diabetes coordinator  6. Migraines: resumed Topamax and inderal . Titrate as needed. --controlled for the most part-  7. Oral pain: resolved 8. Hyperactive airway: resumed Advair 9.  Spasticity LUE flexors, hand splint no meds needed thus far 10.  RLS, change mirapex to .25mg  qhs, MRx1, slept well LOS (Days) 17  D/c home  A FACE TO FACE EVALUATION WAS PERFORMED  Patricia Black 03/20/2013, 7:37 AM

## 2013-03-20 NOTE — Progress Notes (Signed)
Pt discharged at 0900 accompanied by mother and sister. D/C instructions reviewed and pt stated all questions were answered. VSS, no c/o pain. Mick Sell RN

## 2013-03-22 DIAGNOSIS — E119 Type 2 diabetes mellitus without complications: Secondary | ICD-10-CM | POA: Diagnosis present

## 2013-03-22 NOTE — Discharge Summary (Signed)
Physician Discharge Summary  Patient ID: Mabelle Mungin MRN: [id number] DOB/AGE: 08-19-1950 62 y.o.  Admit date: 03/03/2013 Discharge date: 03/20/2013  Discharge Diagnoses:  Principal Problem:   CVA (cerebral infarction) Active Problems:   PFO (patent foramen ovale)   Peroneal DVT (deep venous thrombosis)   OSA (obstructive sleep apnea)   Type II or unspecified type diabetes mellitus without mention of complication, not stated as uncontrolled   Discharged Condition:  Stable.   Significant Diagnostic Studies: Dg Chest 2 View  02/24/2013   CLINICAL DATA:  CVA, baseline.  EXAM: CHEST  2 VIEW  COMPARISON:  None.  FINDINGS: Trachea is midline. Heart size accentuated by AP technique. Biapical pleural thickening. Lungs are otherwise clear. No pleural fluid.  IMPRESSION: No acute findings.   Electronically Signed   By: Leanna Battles M.D.   On: 02/24/2013 20:49   Dg Shoulder Right  03/01/2013   CLINICAL DATA:  Larey Seat, posterior right shoulder pain  EXAM: RIGHT SHOULDER - 2+ VIEW  COMPARISON:  None.  FINDINGS: There is no evidence of fracture or dislocation. There is no evidence of arthropathy or other focal bone abnormality. Soft tissues are unremarkable.  IMPRESSION: Negative.   Electronically Signed   By: Esperanza Heir M.D.   On: 03/01/2013 15:26    Labs:  Basic Metabolic Panel: BMET    Component Value Date/Time   NA 136 03/04/2013 0500   K 4.1 03/04/2013 0500   CL 100 03/04/2013 0500   CO2 24 03/04/2013 0500   GLUCOSE 314* 03/04/2013 0500   BUN 12 03/04/2013 0500   CREATININE 0.78 03/04/2013 0500   CALCIUM 8.9 03/04/2013 0500   GFRNONAA 88* 03/04/2013 0500   GFRAA >90 03/04/2013 0500      CBC:    Component Value Date/Time   WBC 7.4 03/04/2013 0500   RBC 4.00 03/04/2013 0500   HGB 11.4* 03/04/2013 0500   HCT 34.1* 03/04/2013 0500   PLT 206 03/04/2013 0500   MCV 85.3 03/04/2013 0500   MCH 28.5 03/04/2013 0500   MCHC 33.4 03/04/2013 0500   RDW 14.5 03/04/2013 0500   LYMPHSABS 1.5  03/04/2013 0500   MONOABS 0.6 03/04/2013 0500   EOSABS 0.2 03/04/2013 0500   BASOSABS 0.0 03/04/2013 0500     CBG:  Recent Labs Lab 03/19/13 1122 03/19/13 1654 03/19/13 2040 03/20/13 0155 03/20/13 0650  GLUCAP 173* 202* 244* 174* 266*    Brief HPI:   Patricia Black is a 62 y.o. right handed female history of migraine headaches as well as diabetes mellitus (insulin pump) who was admitted 02/24/2013 with left-sided weakness,dizziness as well as fall on her back porch-- found by [facility] when family was unable to get in touch with her. 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 MRI /MRA brain showed acute infarcts in the right basal ganglia and posterior right MCA territory with associated edema and occluded R-MCA beyond origin. Echocardiogram with ejection fraction 70% grade 2 diastolic dysfunction. [name]. Neurology recommended ASA for embolic infarct due to occlusion of R-MCA artery and TEE ordered for workup. TEE revealed PFO and BLE dopplers were positive for L-mid peroneal vein DVT. She was started on xarelto for treatment of DVT. Patient with resultant left hemiparesis, left facial weakness as well as LLE instability with difficulty with mobility   Hospital Course: Courtney Bellizzi was admitted to rehab 03/03/2013 for inpatient therapies to consist of PT, ST and OT at least three hours  five days a week. Past admission physiatrist, therapy team and rehab RN have worked together to provide customized collaborative inpatient rehab. She continues on Xarelto for treatment of left mid peroneal DVT. No complaints of LE pain with activity. Check of lytes showed hypokalemia resolved. She has had mild drop in H/H but no signs of bleeding noted. Leucocytosis has resolved. Mood has been stable and she has shown good participation in therapy. Low dose inderal and Topamax were resumed to prevent recurrent migraines. She is to slowly  increase Topamax to home dose past discharge. Diabetes was monitored with ac/hs checks and was poorly controlled on lantus and SSI. As mentation improved, insulin pump was resumed with subsequent improvement in BS control. Patient's family to help with insulin pump/ medication  management past discharge. Spasticity LUE flexors was treated with resting hand splint.  Patient has made good progress and is at supervision level overall. Family education was done regarding assistance needed with mobiliy, self care as well as higher level cognitive tasks.  She will continue to receive outpatient PT, OT and ST past discharge. She was discharged to home on 03/20/13.    Rehab course: During patient's stay in rehab weekly team conferences were held to monitor patient's progress, set goals and discuss barriers to discharge. Patient has had improvement in activity tolerance, balance, postural control, as well as ability to compensate for deficits. She is has had improvement in functional use LUE  and LLE as well as improved awareness.  She is able to ambulate 85 feet with supervision. She is requires min assist for lower body dressing otherwise is at supervision level for bathing and upper body dressing tasks.   She continues to have difficulty with divided attention and needs increased time to complete tasks. She requires min to supervisory cues  with increase wait time to utilize external aid for recall.    Disposition: 01-Home or Self Care  Diet:  Diabetic diet.   Special Instructions: 1.    [facility]  [phone]:  To continue:   PT     OT    ST 2.  Check blood sugars before meals and at bedtime. Continue your home protocol on insulin management/ bolus.  3.   No driving till cleared by MD.        Future Appointments Provider Department Dept Phone   04/22/2013 1:45 PM Erick Colace, MD [facility] [phone]       Medication List    STOP  taking these medications       hydrochlorothiazide 25 MG tablet  Commonly known as:  HYDRODIURIL     insulin lispro 100 UNIT/ML injection  Commonly known as:  HUMALOG      TAKE these medications       atorvastatin 80 MG tablet  Commonly known as:  LIPITOR  Take 80 mg by mouth daily at 6 PM.     fluticasone 50 MCG/ACT nasal spray  Commonly known as:  FLONASE  Place 1 spray into both nostrils daily as needed for allergies or rhinitis.     Fluticasone-Salmeterol 100-50 MCG/DOSE Aepb  Commonly known as:  ADVAIR  Inhale 1 puff into the lungs 2 (two) times daily.     guaiFENesin 600 MG 12 hr tablet  Commonly known as:  MUCINEX  Take 1 tablet (600 mg total) by mouth 2 (two) times daily.     insulin pump 100 unit/ml Soln  Inject 1 each into the skin 3  times daily with meals, bedtime and 2 AM.     Ipratropium-Albuterol 20-100 MCG/ACT Aers respimat  Commonly known as:  COMBIVENT  Inhale 1 puff into the lungs 4 (four) times daily as needed for wheezing.     loratadine 10 MG tablet  Commonly known as:  CLARITIN  Take 1 tablet (10 mg total) by mouth daily.     mometasone-formoterol 100-5 MCG/ACT Aero  Commonly known as:  DULERA  Inhale 2 puffs into the lungs 2 (two) times daily.     pramipexole 0.25 MG tablet  Commonly known as:  MIRAPEX  Take 1 tablet (0.25 mg total) by mouth at bedtime and may repeat dose one time if needed.     propranolol ER 60 MG 24 hr capsule  Commonly known as:  INDERAL LA  One daily.     Rivaroxaban 15 MG Tabs tablet  Commonly known as:  XARELTO  Take 1 tablet (15 mg total) by mouth 2 (two) times daily with a meal.     Rivaroxaban 20 MG Tabs tablet  Commonly known as:  XARELTO  Take 1 tablet (20 mg total) by mouth daily with supper. Starting on 03/26/13  Start taking on:  03/26/2013     Topiramate ER 100 MG Cp24  Commonly known as:  TROKENDI XR  Take 100 mg by mouth daily. Resume at 100 mg daily and increase to 150 mg in a week.        Follow-up Information   Follow up with Erick Colace, MD On 04/22/2013. (Be there at  1:15  for  1:45  appointment)    Specialty:  Physical Medicine and Rehabilitation   Contact information:   [address] [place] [address] [phone]       Follow up with Gates Rigg, MD. Call today. (for follow up in 4 -6 weeks.)    Specialties:  Neurology, Radiology   Contact information:   [address] [place] [address] [phone]       Follow up with Newt Minion On 04/06/2013. (Be there at 10:45 am for post hospital appointment)       Signed: Jacquelynn Cree 03/22/2013, 4:12 PM

## 2013-03-22 NOTE — Progress Notes (Signed)
Social Work Discharge Note  The overall goal for the admission was met for:   Discharge location: Yes  Length of Stay: Yes - 17 days  Discharge activity level: Yes - Supervision  Home/community participation: Yes  Services provided included: MD, RD, PT, OT, SLP, RN, TR, Pharmacy, Neuropsych and SW  Financial Services: Private Insurance: [employer]  Follow-up services arranged: Outpatient: PT, OT, ST, DME: 20 x 18 w/c and large base quad cane and Patient/Family request outpatient facility: [facility] in [place], DME: [facility] per Rosann Auerbach  Comments (or additional information):  Patient/Family verbalized understanding of follow-up arrangements: Yes  Individual responsible for coordination of the follow-up plan: pt and her daughter.  Pt is going to dtr's home in [place] so that dtr can provide 24/7 supervision to pt.  Confirmed correct DME delivered: Elvera Lennox 03/22/2013    Johniya Durfee, [place]

## 2013-03-27 ENCOUNTER — Emergency Department: Payer: Commercial Managed Care - POS

## 2013-03-27 ENCOUNTER — Observation Stay
Admission: EM | Admit: 2013-03-27 | Discharge: 2013-03-29 | Disposition: A | Discharge status: Home / Self Care | Payer: Commercial Managed Care - POS | Attending: Family Medicine | Admitting: Family Medicine

## 2013-03-27 ENCOUNTER — Observation Stay: Payer: Commercial Managed Care - POS | Admitting: Family Medicine

## 2013-03-27 DIAGNOSIS — Z86718 Personal history of other venous thrombosis and embolism: Secondary | ICD-10-CM | POA: Insufficient documentation

## 2013-03-27 DIAGNOSIS — N390 Urinary tract infection, site not specified: Secondary | ICD-10-CM | POA: Insufficient documentation

## 2013-03-27 DIAGNOSIS — E119 Type 2 diabetes mellitus without complications: Secondary | ICD-10-CM | POA: Insufficient documentation

## 2013-03-27 DIAGNOSIS — E785 Hyperlipidemia, unspecified: Secondary | ICD-10-CM | POA: Insufficient documentation

## 2013-03-27 DIAGNOSIS — Z90710 Acquired absence of both cervix and uterus: Secondary | ICD-10-CM | POA: Insufficient documentation

## 2013-03-27 DIAGNOSIS — Z880 Allergy status to penicillin: Secondary | ICD-10-CM | POA: Insufficient documentation

## 2013-03-27 DIAGNOSIS — Q2111 Secundum atrial septal defect: Secondary | ICD-10-CM | POA: Insufficient documentation

## 2013-03-27 DIAGNOSIS — G459 Transient cerebral ischemic attack, unspecified: Principal | ICD-10-CM | POA: Insufficient documentation

## 2013-03-27 DIAGNOSIS — G43909 Migraine, unspecified, not intractable, without status migrainosus: Secondary | ICD-10-CM | POA: Insufficient documentation

## 2013-03-27 DIAGNOSIS — R42 Dizziness and giddiness: Secondary | ICD-10-CM | POA: Insufficient documentation

## 2013-03-27 DIAGNOSIS — D649 Anemia, unspecified: Secondary | ICD-10-CM | POA: Insufficient documentation

## 2013-03-27 HISTORY — DX: Type 2 diabetes mellitus without complications: E11.9

## 2013-03-27 HISTORY — DX: Migraine, unspecified, not intractable, without status migrainosus: G43.909

## 2013-03-27 HISTORY — DX: Cerebral infarction, unspecified: I63.9

## 2013-03-27 HISTORY — DX: Hyperlipidemia, unspecified: E78.5

## 2013-03-27 HISTORY — DX: Other seasonal allergic rhinitis: J30.2

## 2013-03-27 HISTORY — DX: Patent foramen ovale: Q21.12

## 2013-03-27 LAB — URINALYSIS
Bilirubin, UA: NEGATIVE
Glucose, UA: NEGATIVE
Nitrite, UA: NEGATIVE
Protein, UR: NEGATIVE
Specific Gravity UA: 1.022 (ref 1.001–1.035)
Urine pH: 7 (ref 5.0–8.0)
Urobilinogen, UA: 0.2 mg/dL (ref 0.2–2.0)

## 2013-03-27 LAB — PT/INR
PT INR: 1.5
PT: 17.8 — ABNORMAL HIGH (ref 12.6–15.0)

## 2013-03-27 LAB — COMPREHENSIVE METABOLIC PANEL
ALT: 14 U/L (ref 0–55)
AST (SGOT): 14 U/L (ref 5–34)
Albumin/Globulin Ratio: 1.1 (ref 0.9–2.2)
Albumin: 3.3 g/dL — ABNORMAL LOW (ref 3.5–5.0)
Alkaline Phosphatase: 110 U/L (ref 40–150)
Anion Gap: 10 (ref 5.0–15.0)
BUN: 11.6 mg/dL (ref 7.0–19.0)
Bilirubin, Total: 0.5 mg/dL (ref 0.2–1.2)
CO2: 23 mEq/L (ref 22–29)
Calcium: 8.9 mg/dL (ref 8.5–10.5)
Chloride: 110 mEq/L — ABNORMAL HIGH (ref 98–107)
Creatinine: 1 mg/dL (ref 0.6–1.0)
Globulin: 3 g/dL (ref 2.0–3.6)
Glucose: 203 mg/dL — ABNORMAL HIGH (ref 70–100)
Potassium: 3.7 mEq/L (ref 3.5–5.1)
Protein, Total: 6.3 g/dL (ref 6.0–8.3)
Sodium: 143 mEq/L (ref 136–145)

## 2013-03-27 LAB — CBC AND DIFFERENTIAL
Basophils Absolute Automated: 0.02 (ref 0.00–0.20)
Basophils Automated: 0 %
Eosinophils Absolute Automated: 0.1 (ref 0.00–0.70)
Eosinophils Automated: 2 %
Hematocrit: 35.8 % — ABNORMAL LOW (ref 37.0–47.0)
Hgb: 11.7 g/dL — ABNORMAL LOW (ref 12.0–16.0)
Immature Granulocytes Absolute: 0.01
Immature Granulocytes: 0 %
Lymphocytes Absolute Automated: 1.92 (ref 0.50–4.40)
Lymphocytes Automated: 30 %
MCH: 28.5 pg (ref 28.0–32.0)
MCHC: 32.7 g/dL (ref 32.0–36.0)
MCV: 87.1 fL (ref 80.0–100.0)
MPV: 10.9 fL (ref 9.4–12.3)
Monocytes Absolute Automated: 0.43 (ref 0.00–1.20)
Monocytes: 7 %
Neutrophils Absolute: 3.91 (ref 1.80–8.10)
Neutrophils: 61 %
Platelets: 197 (ref 140–400)
RBC: 4.11 — ABNORMAL LOW (ref 4.20–5.40)
RDW: 15 % (ref 12–15)
WBC: 6.38 (ref 3.50–10.80)

## 2013-03-27 LAB — TROPONIN I: Troponin I: <0.01 ng/mL (ref 0.00–0.09)

## 2013-03-27 LAB — URINE MICROSCOPIC

## 2013-03-27 LAB — CK: Creatine Kinase (CK): 77 U/L (ref 29–168)

## 2013-03-27 LAB — GFR: EGFR: 56

## 2013-03-27 LAB — APTT: PTT: 33 (ref 23–37)

## 2013-03-27 MED ORDER — ACETAMINOPHEN 500 MG PO TABS
1000.0000 mg | ORAL_TABLET | Freq: Once | ORAL | Status: AC
Start: 2013-03-27 — End: 2013-03-27
  Administered 2013-03-27: 1000 mg via ORAL
  Filled 2013-03-27: qty 2

## 2013-03-27 NOTE — ED Notes (Signed)
Family at bedside. 

## 2013-03-27 NOTE — ED Provider Notes (Signed)
Physician/Midlevel provider first contact with patient: 03/27/13 1917         History     Chief Complaint   Patient presents with   . Headache     Patient is a 62 y.o. female presenting with Acute Neurological Problem. The history is provided by the patient. No language interpreter was used.   Cerebrovascular Accident  This is a recurrent problem. The current episode started today. The problem occurs constantly. The problem has been unchanged. Associated symptoms include weakness. Pertinent negatives include no abdominal pain, anorexia, arthralgias, change in bowel habit, chest pain, chills, congestion, coughing, fatigue, fever, headaches, joint swelling, nausea, neck pain, rash, sore throat or vertigo. Nothing aggravates the symptoms. She has tried nothing for the symptoms. The treatment provided no relief.       Past Medical History   Diagnosis Date   . Cerebrovascular accident    . Migraine    . PFO (patent foramen ovale)    . Diabetes mellitus    . Hyperlipidemia    . Seasonal allergies        Past Surgical History   Procedure Date   . Cholecystectomy    . Hysterectomy    . Total abdominal hysterectomy w/ bilateral salpingoophorectomy    . Carpal tunnel release      L       No family history on file.    Social  History   Substance Use Topics   . Smoking status: Passive Smoke Exposure - Never Smoker   . Smokeless tobacco: Not on file   . Alcohol Use: Yes      Comment: socially       .     Allergies   Allergen Reactions   . Penicillins Rash       Current/Home Medications    ATORVASTATIN (LIPITOR) 80 MG TABLET    Take 80 mg by mouth daily.    FLUTICASONE (FLOVENT DISKUS) 50 MCG/BLIST DISKUS INHALER    Inhale into the lungs 2 (two) times daily.    INSULIN INFUSION PUMP DEVICE    by Does not apply route.    MOMETASONE FURO-FORMOTEROL FUM 100-5 MCG/ACT AEROSOL    Inhale into the lungs 2 (two) times daily.    PRAMIPEXOLE (MIRAPEX) 0.25 MG TABLET    Take 0.25 mg by mouth nightly.    PROPRANOLOL (INDERAL LA) 60 MG 24 HR  CAPSULE    Take 60 mg by mouth daily.    RIVAROXABAN (XARELTO) 20 MG TAB    Take 20 mg by mouth daily with dinner.    TOPIRAMATE (TOPAMAX) 100 MG TABLET    Take 100 mg by mouth daily.        Review of Systems   Constitutional: Negative for fever, chills, activity change and fatigue.   HENT: Negative for congestion and sore throat.    Eyes: Negative for photophobia and pain.   Respiratory: Negative for apnea, cough and shortness of breath.    Cardiovascular: Negative for chest pain and palpitations.   Gastrointestinal: Negative for nausea, abdominal pain, abdominal distention, anorexia and change in bowel habit.   Genitourinary: Negative for dysuria, frequency, flank pain and difficulty urinating.   Musculoskeletal: Negative for arthralgias, back pain, joint swelling, neck pain and neck stiffness.   Skin: Negative for color change and rash.   Neurological: Positive for speech difficulty and weakness. Negative for dizziness, vertigo and headaches.   Psychiatric/Behavioral: Negative for agitation. The patient is not nervous/anxious.    All other  systems reviewed and are negative.        Physical Exam    BP: 142/56 mmHg, Heart Rate: 59 , Temp: 98.1 F (36.7 C), Resp Rate: 16 , SpO2: 98 %, Weight: 113.399 kg    Physical Exam   Nursing note and vitals reviewed.  Constitutional: She is oriented to person, place, and time. She appears well-developed and well-nourished. No distress.   HENT:   Head: Normocephalic and atraumatic.   Eyes: Conjunctivae normal and EOM are normal. Pupils are equal, round, and reactive to light.   Neck: Normal range of motion. Neck supple. Carotid bruit is not present.   Cardiovascular: Normal rate and regular rhythm.  Exam reveals no friction rub.    No murmur heard.  Pulmonary/Chest: Effort normal and breath sounds normal. No stridor. No respiratory distress. She has no wheezes. She exhibits no tenderness.   Abdominal: Soft. She exhibits no distension. There is no tenderness. There is no rebound  and no guarding.   Musculoskeletal: She exhibits no edema and no tenderness.   Neurological: She is alert and oriented to person, place, and time. She displays no tremor. No cranial nerve deficit. She exhibits abnormal muscle tone. She displays no seizure activity. Coordination normal.        Left arm paralysis with no ability to raise arm, slight facial asymmetry (left sided weakness) with mild dysarthria.   Skin: Skin is warm, dry and intact. She is not diaphoretic. No erythema.   Psychiatric: She has a normal mood and affect. Her behavior is normal. Judgment normal.       MDM and ED Course     ED Medication Orders      Start     Status Ordering Provider    03/27/13 2110   acetaminophen (TYLENOL) tablet 1,000 mg   Once      Route: Oral  Ordered Dose: 1,000 mg         Last MAR action:  Given Kess Mcilwain J                 MDM  Number of Diagnoses or Management Options  Anemia:   Cerebrovascular accident within last 3 months:   Dysarthria:   History of DVT (deep vein thrombosis):   Diagnosis management comments: I, Roselee Culver, DO (emergency medicine physician), have been the primary provider for this patient during this Emergency Dept visit.     I have reviewed nursing notes on family and social history, past medical issues, and recorded vital signs.    I have reviewed all laboratory tests and radiographic studies and have explained these results to the patient and/or family bedside.    Oxygen saturation by pulse oximetry is 95%-100%, Normal.  Interventions: Patient Observed.    EKG interpretation by Dr. Cassandria Santee, emergency medicine physician.  EKG is Abnormal and nonspecific, sinus rhythm  ST segments show nonspecific abnormalities but there is no acute st elevation.  Rate is normal, 60  No significant widened intervals.    DDX: recurrent stroke    Pt felt nauseated, slight headache and felt speech might have been slurred with symptoms very similar to stroke experienced Nov 27.  Pt had work up at a hospital  in [place], [place], found to have had a stroke due to a left leg DVT and Patent foramen ovale.  Pt is on xarelto now.    Given stroke in last 3 months and Xarelto, TPA is contraindicated however work up is needed as if pt is throwing  new emboli, current regimen may be inadequate.    Left arm weakness is old since recent stroke.         Amount and/or Complexity of Data Reviewed  Clinical lab tests: ordered and reviewed  Tests in the radiology section of CPT: ordered and reviewed  Tests in the medicine section of CPT: ordered and reviewed  Decide to obtain previous medical records or to obtain history from someone other than the patient: yes  Obtain history from someone other than the patient: yes  Review and summarize past medical records: yes  Discuss the patient with other providers: yes  Independent visualization of images, tracings, or specimens: yes      Results     Procedure Component Value Units Date/Time    Urine culture [[id number]] Collected:03/27/13 2024    Specimen Information:Urine / Urine, Clean Catch Updated:03/27/13 2109    Microscopic, Urine [[id number]]  (Abnormal) Collected:03/27/13 2025     RBC, UA 0 - 5 Updated:03/27/13 2051     WBC, UA 11 - 25 (A)      Squamous Epithelial Cells, Urine 11 - 25      Urine Bacteria Many (A)     Urinalysis [[id number]]  (Abnormal) Collected:03/27/13 2025    Specimen Information:Urine Updated:03/27/13 2051     Urine Type Clean Catch      Color, UA YELLOW      Clarity, UA CLOUDY (A)      Specific Gravity UA 1.022      Urine pH 7.0      Leukocyte Esterase, UA MODERATE (A)      Nitrite, UA NEGATIVE      Protein, UR NEGATIVE      Glucose, UA NEGATIVE      Ketones UA TRACE      Urobilinogen, UA 0.2 mg/dL      Bilirubin, UA NEGATIVE      Blood, UA SMALL (A)     Comprehensive metabolic panel [[id number]]  (Abnormal) Collected:03/27/13 2005    Specimen Information:Blood Updated:03/27/13 2050     Glucose 203 (H) mg/dL      BUN 62.9 mg/dL      Creatinine 1.0 mg/dL      Sodium 528  mEq/L      Potassium 3.7 mEq/L      Chloride 110 (H) mEq/L      CO2 23 mEq/L      CALCIUM 8.9 mg/dL      Protein, Total 6.3 g/dL      Albumin 3.3 (L) g/dL      AST (SGOT) 14 U/L      ALT 14 U/L      Alkaline Phosphatase 110 U/L      Bilirubin, Total 0.5 mg/dL      Globulin 3.0 g/dL      Albumin/Globulin Ratio 1.1      Anion Gap 10.0     Creatine Kinase (CK) [[id number]] Collected:03/27/13 2005    Specimen Information:Blood Updated:03/27/13 2050     Creatine Kinase (CK) 77 U/L     GFR [[id number]] Collected:03/27/13 2005     EGFR 56.0 Updated:03/27/13 2050    Troponin I [[id number]] Collected:03/27/13 2005    Specimen Information:Blood Updated:03/27/13 2034     Troponin I <0.01 ng/mL     Rapid Influenza A/B Antigens [[id number]] Collected:03/27/13 2006    Specimen Information:Nasopharyngeal / Nasal Wash Updated:03/27/13 2033    Narrative:    ORDER#: [id number]  ORDERED BY: Marixa Mellott  SOURCE: Nasal Wash                                   COLLECTED:  03/27/13 20:06  ANTIBIOTICS AT COLL.:                                RECEIVED :  03/27/13 20:17  Influenza Rapid Antigen A&B                FINAL       03/27/13 20:32  03/27/13   Negative for Influenza A and B             Reference Range: Negative      APTT [[id number]] Collected:03/27/13 2005     PTT 33 Updated:03/27/13 2026    Protime-INR [[id number]]  (Abnormal) Collected:03/27/13 2005    Specimen Information:Blood Updated:03/27/13 2026     PT 17.8 (H)      PT INR 1.5      PT Anticoag. Given Within 48 hrs. warfarin ([place]     CBC and differential [[id number]]  (Abnormal) Collected:03/27/13 2005    Specimen Information:Blood / Blood Updated:03/27/13 2013     WBC 6.38      RBC 4.11 (L)      Hgb 11.7 (L) g/dL      Hematocrit 84.6 (L) %      MCV 87.1 fL      MCH 28.5 pg      MCHC 32.7 g/dL      RDW 15 %      Platelets 197      MPV 10.9 fL      Neutrophils 61 %      Lymphocytes Automated 30 %      Monocytes 7 %      Eosinophils Automated 2 %       Basophils Automated 0 %      Immature Granulocyte 0 %      Neutrophils Absolute 3.91      Abs Lymph Automated 1.92      Abs Mono Automated 0.43      Abs Eos Automated 0.10      Absolute Baso Automated 0.02      Absolute Immature Granulocyte 0.01         Radiology Results (24 Hour)     Procedure Component Value Units Date/Time    Chest AP Portable [[id number]] Collected:03/27/13 1950    Order Status:Completed  Updated:03/27/13 1955    Narrative:    CLINICAL HISTORY: Stroke symptoms    COMPARISON: None    FINDINGS:Portable AP view of the chest semierect:  The lungs are clear.  There is no pneumothorax or pleural effusion. The cardiomediastinal  contours are within normal limits.       Impression:      1.  No acute findings.    Emeline Darling, MD   03/27/2013 7:51 PM    CT Head without Contrast [[id number]] Collected:03/27/13 1923    Order Status:Completed  Updated:03/27/13 1954    Narrative:    HISTORY: Recent stroke. Recurrent left-sided deficits    TECHNIQUE: Axial CT images of the head were obtained, without  intravenous contrast.    COMPARISON: No prior studies are available.    FINDINGS: There is an ill-defined region of patchy hypoattenuation  involving the corona radiata and lateral aspect of the basal  ganglia on  the right, likely corresponding to the patient's recent/subacute  infarct. There is hypoattenuation with some encephalomalacia involving  the peripheral right temporal lobe. There is also a small region of  hypoattenuation with loss of gray-white differentiation in the  peripheral right parietal lobe on image 19. This is of uncertain  chronicity.    No acute hemorrhage is identified. There is no mass effect or midline  shift. The basilar cisterns remain patent.      Impression:     There is patchy hypoattenuation involving the corona  radiata, basal ganglia, and right temporal lobe, suggesting  remote/subacute ischemia. There is also a smaller focus of loss of  gray-white differentiation involving  the peripheral right temporal lobe  which may be subacute as well, but this is age indeterminant.  Correlation with prior studies or MRI would be helpful to better gauge  the chronicity of these abnormalities. There is no acute intracranial  hemorrhage or mass effect.    Urgent findings were discussed with Dr. Cassandria Santee at 7:28 PM on 03/27/2013    Nicoletta Dress, MD   03/27/2013 7:50 PM            Procedures    Clinical Impression & Disposition     Clinical Impression  Final diagnoses:   Dysarthria   History of DVT (deep vein thrombosis)   Cerebrovascular accident within last 3 months   Anemia        ED Disposition     Admit Bed Type: Telemetry [5]  Admitting Physician: Cherylynn Ridges [[id number]]  Patient Class: Observation [104]             New Prescriptions    No medications on file                 Toni Arthurs, DO  03/27/13 2142

## 2013-03-27 NOTE — ED Notes (Signed)
Pt reports 20 minutes PTA she had "same symptoms of last time I had a stroke" reports generalized dizziness worse with ambulation feels as though her speech may be slightly slurred; daughter reports pt has left sided facial droop that had "almost resolved" from previous stroke but "appears worse now" pt has no focal deficits appreciated at time of triage or specific localized weakness; equal hand grip; pt taken immediately to CT scan; pt also reports DVT in leg denies CP/SOB; states that she is on blood thinners at this time; denies fall/trauma

## 2013-03-28 ENCOUNTER — Observation Stay: Payer: Commercial Managed Care - POS

## 2013-03-28 ENCOUNTER — Encounter: Payer: Self-pay | Admitting: Nurse Practitioner

## 2013-03-28 DIAGNOSIS — N390 Urinary tract infection, site not specified: Secondary | ICD-10-CM | POA: Diagnosis present

## 2013-03-28 LAB — LIPID PANEL
Cholesterol / HDL Ratio: 4
Cholesterol: 116 mg/dL (ref 0–199)
HDL: 29 mg/dL — ABNORMAL LOW (ref >40)
LDL Calculated: 69 mg/dL (ref 0–99)
Triglycerides: 92 mg/dL (ref 34–149)
VLDL Calculated: 18 mg/dL (ref 10–40)

## 2013-03-28 LAB — CBC
Hematocrit: 34.9 % — ABNORMAL LOW (ref 37.0–47.0)
Hgb: 11.3 g/dL — ABNORMAL LOW (ref 12.0–16.0)
MCH: 28.2 pg (ref 28.0–32.0)
MCHC: 32.4 g/dL (ref 32.0–36.0)
MCV: 87 fL (ref 80.0–100.0)
MPV: 10.7 fL (ref 9.4–12.3)
Platelets: 184 (ref 140–400)
RBC: 4.01 — ABNORMAL LOW (ref 4.20–5.40)
RDW: 15 % (ref 12–15)
WBC: 5.74 (ref 3.50–10.80)

## 2013-03-28 LAB — MAGNESIUM: Magnesium: 1.8 mg/dL (ref 1.6–2.6)

## 2013-03-28 LAB — BASIC METABOLIC PANEL
Anion Gap: 10 (ref 5.0–15.0)
BUN: 11.7 mg/dL (ref 7.0–19.0)
CO2: 22 mEq/L (ref 22–29)
Calcium: 8.8 mg/dL (ref 8.5–10.5)
Chloride: 114 mEq/L — ABNORMAL HIGH (ref 98–107)
Creatinine: 0.8 mg/dL (ref 0.6–1.0)
Glucose: 88 mg/dL (ref 70–100)
Potassium: 3.4 mEq/L — ABNORMAL LOW (ref 3.5–5.1)
Sodium: 146 mEq/L — ABNORMAL HIGH (ref 136–145)

## 2013-03-28 LAB — GLUCOSE WHOLE BLOOD - POCT
Whole Blood Glucose POCT: 90 mg/dL (ref 70–100)
Whole Blood Glucose POCT: 67 mg/dL — ABNORMAL LOW (ref 70–100)
Whole Blood Glucose POCT: 72 mg/dL (ref 70–100)
Whole Blood Glucose POCT: 176 mg/dL — ABNORMAL HIGH (ref 70–100)
Whole Blood Glucose POCT: 113 mg/dL — ABNORMAL HIGH (ref 70–100)
Whole Blood Glucose POCT: 119 mg/dL — ABNORMAL HIGH (ref 70–100)

## 2013-03-28 LAB — GFR: EGFR: >60

## 2013-03-28 LAB — HEMOLYSIS INDEX: Hemolysis Index: 15 — ABNORMAL HIGH (ref 0–9)

## 2013-03-28 MED ORDER — IODIXANOL 320 MG/ML IV SOLN
100.0000 mL | Freq: Once | INTRAVENOUS | Status: AC | PRN
Start: 2013-03-28 — End: 2013-03-28
  Administered 2013-03-28: 100 mL via INTRAVENOUS

## 2013-03-28 MED ORDER — INSULIN ASPART 100 UNIT/ML SC SOLN
1.0000 [IU] | Freq: Every evening | SUBCUTANEOUS | Status: DC
Start: 2013-03-28 — End: 2013-03-29

## 2013-03-28 MED ORDER — GLUCAGON HCL (RDNA) 1 MG IJ SOLR
1.0000 mg | INTRAMUSCULAR | Status: DC | PRN
Start: 2013-03-28 — End: 2013-03-29

## 2013-03-28 MED ORDER — PROPRANOLOL HCL 10 MG PO TABS
20.0000 mg | ORAL_TABLET | Freq: Two times a day (BID) | ORAL | Status: DC
Start: 2013-03-28 — End: 2013-03-29
  Administered 2013-03-28 – 2013-03-29 (×3): 20 mg via ORAL
  Filled 2013-03-28 (×7): qty 2

## 2013-03-28 MED ORDER — TOPIRAMATE 25 MG PO TABS
100.0000 mg | ORAL_TABLET | Freq: Every day | ORAL | Status: DC
Start: 2013-03-28 — End: 2013-03-29
  Administered 2013-03-28 (×2): 100 mg via ORAL
  Filled 2013-03-28: qty 2
  Filled 2013-03-28 (×2): qty 4

## 2013-03-28 MED ORDER — SODIUM CHLORIDE 0.9 % IV MBP
1.0000 g | INTRAVENOUS | Status: DC
Start: 2013-03-28 — End: 2013-03-28
  Administered 2013-03-28: 1 g via INTRAVENOUS
  Filled 2013-03-28: qty 1000

## 2013-03-28 MED ORDER — FLUTICASONE PROPIONATE HFA 110 MCG/ACT IN AERO
1.0000 | INHALATION_SPRAY | Freq: Two times a day (BID) | RESPIRATORY_TRACT | Status: DC
Start: 2013-03-28 — End: 2013-03-29
  Administered 2013-03-28 – 2013-03-29 (×2): 1 via RESPIRATORY_TRACT
  Filled 2013-03-28: qty 12

## 2013-03-28 MED ORDER — GLUCOSE 40 % PO GEL
15.0000 g | ORAL | Status: DC | PRN
Start: 2013-03-28 — End: 2013-03-29

## 2013-03-28 MED ORDER — DEXTROSE 50 % IV SOLN
25.0000 mL | INTRAVENOUS | Status: DC | PRN
Start: 2013-03-28 — End: 2013-03-29

## 2013-03-28 MED ORDER — SODIUM CHLORIDE 0.9 % IV SOLN
INTRAVENOUS | Status: AC
Start: 2013-03-28 — End: 2013-03-28

## 2013-03-28 MED ORDER — POTASSIUM CHLORIDE CRYS ER 20 MEQ PO TBCR
40.0000 meq | EXTENDED_RELEASE_TABLET | Freq: Once | ORAL | Status: AC
Start: 2013-03-28 — End: 2013-03-28
  Administered 2013-03-28: 40 meq via ORAL
  Filled 2013-03-28: qty 2

## 2013-03-28 MED ORDER — PRAMIPEXOLE DIHYDROCHLORIDE 0.25 MG PO TABS
0.2500 mg | ORAL_TABLET | Freq: Every evening | ORAL | Status: DC
Start: 2013-03-28 — End: 2013-03-29
  Administered 2013-03-28 (×2): 0.25 mg via ORAL
  Filled 2013-03-28 (×4): qty 1

## 2013-03-28 MED ORDER — ATORVASTATIN CALCIUM 20 MG PO TABS
80.0000 mg | ORAL_TABLET | Freq: Every day | ORAL | Status: DC
Start: 2013-03-28 — End: 2013-03-29
  Administered 2013-03-28: 80 mg via ORAL
  Filled 2013-03-28: qty 4

## 2013-03-28 MED ORDER — RIVAROXABAN 20 MG PO TABS
20.0000 mg | ORAL_TABLET | Freq: Every day | ORAL | Status: DC
Start: 2013-03-28 — End: 2013-03-29
  Administered 2013-03-28: 20 mg via ORAL
  Filled 2013-03-28 (×3): qty 1

## 2013-03-28 MED ORDER — RIVAROXABAN 20 MG PO TABS
20.0000 mg | ORAL_TABLET | Freq: Every day | ORAL | Status: DC
Start: 2013-03-28 — End: 2013-03-28

## 2013-03-28 MED ORDER — ACETAMINOPHEN 325 MG PO TABS
650.0000 mg | ORAL_TABLET | ORAL | Status: DC | PRN
Start: 2013-03-28 — End: 2013-03-29

## 2013-03-28 MED ORDER — ONDANSETRON HCL 4 MG/2ML IJ SOLN
4.0000 mg | Freq: Three times a day (TID) | INTRAMUSCULAR | Status: DC | PRN
Start: 2013-03-28 — End: 2013-03-29

## 2013-03-28 MED ORDER — INSULIN ASPART 100 UNIT/ML SC SOLN
1.0000 [IU] | Freq: Three times a day (TID) | SUBCUTANEOUS | Status: DC
Start: 2013-03-28 — End: 2013-03-29

## 2013-03-28 NOTE — PT Progress Note (Signed)
[facility]  [address]  [place], [place]. [address]    Department of Rehabilitation  [phone]    Physical Therapy Evaluation    Patient: Katherine [name]    MRN#: [id number]     Time of treatment: Time Calculation  PT Received On: 03/28/13  Start Time: 1045  Stop Time: 1127  Time Calculation (min): 42 min    PT Visit Number: 1    Consult received for Katherine [name] for PT Evaluation and Treatment.  Patient's medical condition is appropriate for Physical therapy intervention at this time.       Precautions and Contraindications:   Precautions  Other Precautions: fall precautions    Medical Diagnosis: Anemia [285.9]  Dysarthria [784.51]  History of DVT (deep vein thrombosis) [V12.51]  Cerebrovascular accident within last 3 months [V12.54]  TIA (transient ischemic attack)  Stroke-like symptoms    History of Present Illness: Katherine [name] is a 62 y.o. female admitted on 03/27/2013 with a headache & dizziness.  Pt recently suffered a CVA on 02/15/13 & went to rehab in [place].  On 03/20/13, pt was D/C'd & moved to her daughter's house in [place].  Her symptoms lasted about 15 min, but felt he symptoms had resolved by the time she was admitted.      Patient Active Problem List   Diagnosis   . TIA (transient ischemic attack)   . Dysarthria   . History of DVT (deep vein thrombosis)   . PFO (patent foramen ovale)   . DM (diabetes mellitus)   . Hyperlipidemia   . Chronic migraine   . Dizziness   . UTI (lower urinary tract infection)   . Stroke-like symptoms        Past Medical/Surgical History:  Past Medical History   Diagnosis Date   . Cerebrovascular accident    . Migraine    . PFO (patent foramen ovale)    . Diabetes mellitus    . Hyperlipidemia    . Seasonal allergies       Past Surgical History   Procedure Date   . Cholecystectomy    . Hysterectomy    . Total abdominal hysterectomy w/ bilateral salpingoophorectomy    . Carpal tunnel release      L         X-Rays/Tests/Labs:  CT head 03/27/13 ->  no acute ICH or mass effect, patchy hypoattenuation in the corona radiata, basal ganglia, & R temporal lobe suggesting subacute ischemia.      Social History:  Prior Level of Function  Prior level of function: Ambulates with assistive device;Independent with ADLs  Assistive Device: Quad cane;Wheelchair  Baseline Activity Level: Household ambulation  Driving: does not drive (not driving currently due to stroke)  Cooking: No  Employment: Unemployed  DME Currently at Home: Quad cane;Wheelchair-manual  Home Living Arrangements  Living Arrangements: Children (moved from [place] to live with dtr, son-in-law (both work) & grandson)  Type of Home: House  Home Layout: Multi-level;Access;Stairs to enter with rails (add number in comment) (5 steps to enter with rail, bed/bath in basement with full flight down with rail)  Bathroom Shower/Tub: Pension scheme manager: Standard  Bathroom Equipment: Academic librarian Accessibility: Accessible  DME Currently at Home: Quad cane;Wheelchair-manual  Home Living - Notes / Comments: was just d/c'd from rehab in [place] 1 wk PTA to her dtr's house here in [place]    Subjective: Patient is agreeable to participation in the therapy session.   Patient  Goal  Patient Goal: To regain function in L arm again & return to PLOF  Pain Assessment  Pain Assessment: No/denies pain    Objective:  Observation of Patient/Vital Signs:  Patient is in bed with peripheral IV & B SCDs in place. Pt's daughter present at bedside.      Inspection/Posture  Inspection/Posture: Noted L UE in flexor synergy pattern, especially with attempted use    Cognition  Arousal/Alertness: Appropriate responses to stimuli  Attention Span: Appears intact  Orientation Level: Oriented X4  Memory: Appears intact  Following Commands: Follows all commands and directions without difficulty  Safety Awareness: independent  Insights: Fully aware of deficits  Problem Solving: Able to problem solve independently       Gross ROM  Right  Upper Extremity ROM: within functional limits  Left Upper Extremity ROM: needs focused assessment  Left Upper Extremity Overall ROM % reduced: reduced by 75%  LUE ROM - Shoulder: reduced by 50%  LUE ROM - Elbow: reduced by 25%  LUE ROM - Hand: reduced by 75%  Right Lower Extremity ROM: within functional limits  Left Lower Extremity ROM: within functional limits  Gross Strength  Right Upper Extremity Strength: within functional limits  Left Upper Extremity Strength: needs focused assessment  L Shoulder Flexion: 1/5  L Shoulder Extension: 1/5  L Shoulder ABduction: 1/5  L Elbow Flexion: 2-/5  L Elbow Extension: 1/5  L Wrist Flexion: 0/5  L Wrist Extension: 0/5  Right Lower Extremity Strength: within functional limits  Left Lower Extremity Strength: needs focused assessment  L Hip Flexion: 4/5  L Hip Extension: 4/5  L Hip ABduction: 4+/5  L Hip ADduction: 4+/5  L Knee Flexion: 4/5  L Knee Extension: 4+/5  L Ankle Dorsiflexion: 4+/5  L Ankle Plantar Flexion: 5/5       Functional Mobility  Supine to Sit: Min assist to right  Sit to Stand: Min assist  Stand to Sit: Stand by assistance     Locomotion  Ambulation: with quad cane;stand by assistance  Ambulation Distance (Feet): 35 Feet  Pattern: L foot decreased clearance;decreased step length;decreased cadence     Balance  Balance: needs focused assessment  Sitting - Static: Good  Sitting - Dynamic: Good  Standing - Static: Fair  Standing - Dynamic: Poor    Participation and Endurance  Participation Effort: excellent  Endurance: Tolerates 30 min exercise with multiple rests         Treatment Activities: Educated pt & dtr on how to perform sit to/from stand at home using L UE primarily so that it can receive "practice time" to help regain function.  Demo'd to pt & dtr how dtr can assist with environmental modification of building up surface height on L & where to place dtr's hands to facilitate better alignment & muscle activation in L UE, specifically in hand & L elbow.       Educated the patient to role of physical therapy, plan of care, goals of therapy and safety with mobility and ADLs, home safety, discharge instructions.    Assessment: Katherine [name] is a 62 y.o. female admitted 03/27/2013 presenting with headache & dizziness after suffering a CVA on 02/15/13.  Pt presents to PT with the below impairments & will benefit from continued skilled inpt PT to address these impairments & maximize her independence with mobility & gait, while helping her regain function in her L UE.       Impairments: Assessment: Decreased UE ROM;Decreased UE strength;Decreased LE  strength;Decreased endurance/activity tolerance;Impaired motor control;Impaired coordination;Decreased functional mobility;Decreased balance;Gait impairment.     Therapy Diagnosis: unsteady gait from recent CVA with residual L UE hemiparesis    Rehabilitation Potential: Prognosis: Good;With continued PT status post acute discharge    Plan: Treatment/Interventions: Exercise;Gait training;Neuromuscular re-education;Functional transfer training;LE strengthening/ROM;Endurance training;Patient/family training;Equipment eval/education;Bed mobility PT Frequency: 6-7x/wk    Risks/Benefits/POC Discussed with Pt/Family: With patient/family     Goals:   Goals  Goal Formulation: With patient/family  Time for Goal Acheivement: By time of discharge  Goals: Select goal  Pt Will Go Supine To Sit: Independently;to maximize functional mobility and independence;1 visit  Pt Will Perform Sit to Stand: With stand by assist;to maximize functional mobility and independence;3 visits (without use of UEs to assist)  Pt Will Ambulate: 101-150 feet;with quad cane;With stand by assist;to maximize functional mobility and independence;3 visits  Pt Will Go Up / Down Stairs: 1 flight;With stand by assist;to maximize functional mobility and independence;5 visits (with rail)    Discharge Recommendations: Discharge Recommendation: Home with home health PT;Home  with supervision  DME Recommended for Discharge: [facility]    Allyne Gee, PT, MS  [phone]

## 2013-03-28 NOTE — Consults (Signed)
Service Date: 03/28/2013     Patient Type: V     CONSULTING PHYSICIAN: Bruna Potter MD     REFERRING PHYSICIAN: Anthony Sar MD     CHIEF COMPLAINT AND REASON FOR CONSULTATION:  Episode of slurred speech and dizziness.     HISTORY OF PRESENT ILLNESS:  [name] is a 62 year old left-handed female with a history of  recent stroke, diabetes, hyperlipidemia, and migraine who was admitted  yesterday after presenting with the above complaints.  The patient was  hospitalized in [place], [place] the day before Thanksgiving after  presenting with left-sided weakness.  She was diagnosed with a stroke.   Evaluation included a limited MRI of the brain.  Reportedly, she had a  carotid Doppler done.  She was found to have a PFO on a TEE.  She was also  found to have a DVT in the left lower extremity.  She was discharged on  Xarelto.  She continues to have significant weakness of her left arm and  some mild weakness in her left leg.  She currently ambulates with a cane.   She did have inpatient rehabilitation in [place].  She has not been  able to continue with outpatient therapy.  She has now moved to the area to  live with her daughter.  She was in her usual state of health until  yesterday.  She began noticing some dizziness as well as some problems with  her balance.  Her speech was also slurred.  The symptoms were similar to  her previous stroke.  She presented to the emergency department.  In the  emergency room, she had a noncontrast head CT, which was notable for patchy  hypoattenuation involving the corona radiata, basal ganglia and right  temporal lobe suggesting remote/subacute ischemia.  A smaller focus of loss  of gray-white differentiation involving the peripheral right temporal lobe  was also noted.  The patient was admitted for further evaluation and  treatment.  Since admission to the hospital, the patient has returned back  to her baseline status.  She reports she was  symptomatic for 5 or 6 hours.   An MRI of the brain was planned; however, the patient refuses.  She is  extremely claustrophobic.  She required sedation by anesthesia to have her  previous MRI done.  She reports no other recent illnesses.     PAST MEDICAL HISTORY:  Recent CVA as noted above, diabetes, hyperlipidemia, migraine, PFO,  seasonal allergies, DVT.     PAST SURGICAL HISTORY:  Cholecystectomy, hysterectomy, left carpal tunnel release.     MEDICATIONS AS AN OUTPATIENT:  Lipitor, Flovent, insulin, Mirapex, Xarelto, Topamax.     FAMILY HISTORY:  The patient's paternal grandfather had a stroke.     SOCIAL HISTORY:  Denies tobacco use.  She reports occasional alcohol use.  She previously  worked in Consulting civil engineer.     REVIEW OF SYSTEMS:  As per HPI.  All remaining systems negative.     PHYSICAL EXAMINATION:  VITAL SIGNS:  Blood pressure 117/57, pulse 64, respirations 16, temperature  98.  GENERAL:  Obese female in no acute distress.  HEENT:  Normocephalic, atraumatic.  Oropharynx clear.  NECK:  Supple.  There are no carotid bruits.  CARDIOVASCULAR:  Regular rate and rhythm.  EXTREMITIES:  Without clubbing, cyanosis, or edema.  NEUROLOGIC:  The patient is alert and oriented x3.  Speech is fluent.   There is no aphasia.  Attention span and concentration are  normal.  Fund of  knowledge is normal.  Remote and recent memory is intact.  The pupils are  reactive to light.  Visual fields are full to confrontation.  Extraocular  movements are intact.  There is normal sensation of the face.  The face is  symmetric.  The palate goes up symmetrically.  Hearing is intact.  The  sternocleidomastoids are of equal strength.  The tongue is midline.  Motor  examination is notable for 5/5 strength on the right.  In the left upper  extremity, strength is 1 to 2/5.  In the left lower extremity, strength is  4/5.  Sensory exam is intact to light touch.  Coordination testing is  normal on the right.  Gait examination deferred.  DTRs are increased  on the  left as compared to the right.  The left toe is upgoing, the right toe is  downgoing.     LABORATORY DATA:  Total cholesterol 116.  HDL cholesterol 29, LDL cholesterol 69.  BMP  significant for sodium of 146, potassium 3.4, chloride 114.  CBC  significant for hemoglobin of 11.3, hematocrit 34.9.     IMPRESSION:  1.  Probable right hemispheric transient ischemic attack.  2.  Reported history of patent foramen ovale and left lower extremity deep  venous thrombosis.  3.  Diabetes.  4.  Hyperlipidemia.  5.  Migraine headaches.     RECOMMENDATIONS:  1.  CT angiogram of the head and neck.  2.  Lower extremity Doppler has been ordered.  3.  Recommend formal cardiology consultation regarding possible PFO  closure.  4.  Further recommendations will depend upon the results of the above and  upon the patient's clinical course.           D:  03/28/2013 12:51 PM by Dr. Bruna Potter, MD ([id number])  T:  03/28/2013 14:26 PM by [id number]      Everlean Cherry: [id number]) (Doc ID: [id number])

## 2013-03-28 NOTE — H&P (Signed)
ADMISSION HISTORY AND PHYSICAL EXAM    Date Time: 03/28/2013 2:28 AM  Patient Name: [facility]  Attending Physician: Carolan Shiver*       Chief Complaint:   Headache and dizzy    History of Present Illness:   Katherine [name] is a 62 y.o. female who presents to the hospital with headache and dizziness.  Pt reports that she moved here from [facility] after having a stroke on Nov 17th.  She was discharged from a rehab facility there on Dec 20th and came here to live with her daughter.  On day of admission, she was getting ready to go to the store with her daughter and started to feel dizzy and unsteady.  She ambulates with a cane, so she sat down into her wheelchair.  She reports having a headache most of the am prior to this.  She associated some nausea as well, but did not vomit.  Also noted a change to her voice and she could not get words out the way she wanted.  These symptoms lasted about 15 mins.  She states this was very similar to the symptoms she had with her stroke, so she felt like she should come in and be evaluated.  Symptoms have resolved and she feels back to her baseline at this point in time.     Past Medical History:     Past Medical History   Diagnosis Date   . Cerebrovascular accident    . Migraine    . PFO (patent foramen ovale)    . Diabetes mellitus    . Hyperlipidemia    . Seasonal allergies        Past Surgical History:     Past Surgical History   Procedure Date   . Cholecystectomy    . Hysterectomy    . Total abdominal hysterectomy w/ bilateral salpingoophorectomy    . Carpal tunnel release      L       Family History:     Family History   Problem Relation Age of Onset   . Diabetes Mother    . Cancer Mother    . COPD Father    . Cancer Sister    . Stroke Paternal Grandfather        Social History:     History     Social History   . Marital Status: Married     Spouse Name: N/A     Number of Children: N/A   . Years of Education: N/A     Social History Main Topics   . Smoking status:  Passive Smoke Exposure - Never Smoker   . Smokeless tobacco: Never Used   . Alcohol Use: Yes      Comment: socially-- maybe once or twice a month   . Drug Use: No   . Sexually Active:      Other Topics Concern   . Not on file     Social History Narrative   . No narrative on file       Allergies:     Allergies   Allergen Reactions   . Penicillins Rash       Medications:     Current/Home Medications    ATORVASTATIN (LIPITOR) 80 MG TABLET    Take 80 mg by mouth daily.    FLUTICASONE (FLOVENT DISKUS) 50 MCG/BLIST DISKUS INHALER    Inhale into the lungs 2 (two) times daily.    INSULIN INFUSION PUMP DEVICE  by Does not apply route.    MOMETASONE FURO-FORMOTEROL FUM 100-5 MCG/ACT AEROSOL    Inhale into the lungs 2 (two) times daily.    PRAMIPEXOLE (MIRAPEX) 0.25 MG TABLET    Take 0.25 mg by mouth nightly.    PROPRANOLOL (INDERAL LA) 60 MG 24 HR CAPSULE    Take 60 mg by mouth daily.    RIVAROXABAN (XARELTO) 20 MG TAB    Take 20 mg by mouth daily with dinner.    TOPIRAMATE (TOPAMAX) 100 MG TABLET    Take 100 mg by mouth daily.       Review of Systems:   Review of Systems   Constitutional: Negative for fever, chills, malaise/fatigue and diaphoresis.   HENT: Negative for congestion, sore throat and tinnitus.    Eyes: Negative for blurred vision and discharge.   Respiratory: Negative for cough, sputum production, shortness of breath and wheezing.    Cardiovascular: Negative for chest pain, palpitations and leg swelling.   Gastrointestinal: Positive for nausea. Negative for heartburn, vomiting, abdominal pain, diarrhea, constipation and blood in stool.   Genitourinary: Negative for dysuria, urgency, frequency and flank pain.   Musculoskeletal: Negative for back pain, falls, myalgias and neck pain.   Skin: Negative for rash.   Neurological: Positive for dizziness and headaches. Negative for tingling, sensory change, speech change, seizures, loss of consciousness and weakness.   Endo/Heme/Allergies: Does not bruise/bleed easily.    Psychiatric/Behavioral: Negative for depression, suicidal ideas, memory loss and substance abuse. The patient is not nervous/anxious.          Physical Exam:   Physical Exam   Nursing note and vitals reviewed.  Constitutional: She is oriented to person, place, and time and well-developed, well-nourished, and in no distress.   HENT:   Head: Normocephalic and atraumatic.   Eyes: EOM are normal. Pupils are equal, round, and reactive to light.   Neck: Normal range of motion. Neck supple. No JVD present. No tracheal deviation present. No thyromegaly present.   Cardiovascular: Normal rate, regular rhythm, normal heart sounds and intact distal pulses.  Exam reveals no gallop and no friction rub.    No murmur heard.  Pulmonary/Chest: Effort normal and breath sounds normal. No respiratory distress. She has no wheezes. She exhibits no tenderness.   Abdominal: Soft. Bowel sounds are normal. She exhibits no distension and no mass. There is no tenderness.   Musculoskeletal: Normal range of motion. She exhibits no edema.   Neurological: She is alert and oriented to person, place, and time. She is not disoriented. She displays no weakness and normal speech. No cranial nerve deficit. Coordination normal. GCS score is 15.        Has returned to baseline at present   Skin: Skin is warm and dry.   Psychiatric: Mood, memory, affect and judgment normal.         Filed Vitals:    03/28/13 0056   BP: 115/57   Pulse: 49   Temp: 97.7 F (36.5 C)   Resp: 17   SpO2: 96%       Labs:     Results     Procedure Component Value Units Date/Time    Urine culture [[id number]] Collected:03/27/13 2024    Specimen Information:Urine / Urine, Clean Catch Updated:03/28/13 0045    Microscopic, Urine [[id number]]  (Abnormal) Collected:03/27/13 2025     RBC, UA 0 - 5 Updated:03/27/13 2051     WBC, UA 11 - 25 (A)      Squamous Epithelial  Cells, Urine 11 - 25      Urine Bacteria Many (A)     Urinalysis [[id number]]  (Abnormal) Collected:03/27/13 2025    Specimen  Information:Urine Updated:03/27/13 2051     Urine Type Clean Catch      Color, UA YELLOW      Clarity, UA CLOUDY (A)      Specific Gravity UA 1.022      Urine pH 7.0      Leukocyte Esterase, UA MODERATE (A)      Nitrite, UA NEGATIVE      Protein, UR NEGATIVE      Glucose, UA NEGATIVE      Ketones UA TRACE      Urobilinogen, UA 0.2 mg/dL      Bilirubin, UA NEGATIVE      Blood, UA SMALL (A)     Comprehensive metabolic panel [[id number]]  (Abnormal) Collected:03/27/13 2005    Specimen Information:Blood Updated:03/27/13 2050     Glucose 203 (H) mg/dL      BUN 78.2 mg/dL      Creatinine 1.0 mg/dL      Sodium 956 mEq/L      Potassium 3.7 mEq/L      Chloride 110 (H) mEq/L      CO2 23 mEq/L      CALCIUM 8.9 mg/dL      Protein, Total 6.3 g/dL      Albumin 3.3 (L) g/dL      AST (SGOT) 14 U/L      ALT 14 U/L      Alkaline Phosphatase 110 U/L      Bilirubin, Total 0.5 mg/dL      Globulin 3.0 g/dL      Albumin/Globulin Ratio 1.1      Anion Gap 10.0     Creatine Kinase (CK) [[id number]] Collected:03/27/13 2005    Specimen Information:Blood Updated:03/27/13 2050     Creatine Kinase (CK) 77 U/L     GFR [[id number]] Collected:03/27/13 2005     EGFR 56.0 Updated:03/27/13 2050    Troponin I [[id number]] Collected:03/27/13 2005    Specimen Information:Blood Updated:03/27/13 2034     Troponin I <0.01 ng/mL     Rapid Influenza A/B Antigens [[id number]] Collected:03/27/13 2006    Specimen Information:Nasopharyngeal / Nasal Wash Updated:03/27/13 2033    Narrative:    ORDER#: [id number]                                    ORDERED BY: Roselee Culver  SOURCE: Nasal Wash                                   COLLECTED:  03/27/13 20:06  ANTIBIOTICS AT COLL.:                                RECEIVED :  03/27/13 20:17  Influenza Rapid Antigen A&B                FINAL       03/27/13 20:32  03/27/13   Negative for Influenza A and B             Reference Range: Negative      APTT [[id number]] Collected:03/27/13 2005     PTT 33 Updated:03/27/13 2026     Protime-INR [[id number]]  (Abnormal) Collected:03/27/13 2005  Specimen Information:Blood Updated:03/27/13 2026     PT 17.8 (H)      PT INR 1.5      PT Anticoag. Given Within 48 hrs. warfarin ([place]     CBC and differential [[id number]]  (Abnormal) Collected:03/27/13 2005    Specimen Information:Blood / Blood Updated:03/27/13 2013     WBC 6.38      RBC 4.11 (L)      Hgb 11.7 (L) g/dL      Hematocrit 40.9 (L) %      MCV 87.1 fL      MCH 28.5 pg      MCHC 32.7 g/dL      RDW 15 %      Platelets 197      MPV 10.9 fL      Neutrophils 61 %      Lymphocytes Automated 30 %      Monocytes 7 %      Eosinophils Automated 2 %      Basophils Automated 0 %      Immature Granulocyte 0 %      Neutrophils Absolute 3.91      Abs Lymph Automated 1.92      Abs Mono Automated 0.43      Abs Eos Automated 0.10      Absolute Baso Automated 0.02      Absolute Immature Granulocyte 0.01            Rads:   Ct Head Without Contrast    03/27/2013  HISTORY: Recent stroke. Recurrent left-sided deficits  TECHNIQUE: Axial CT images of the head were obtained, without intravenous contrast.  COMPARISON: No prior studies are available.  FINDINGS: There is an ill-defined region of patchy hypoattenuation involving the corona radiata and lateral aspect of the basal ganglia on the right, likely corresponding to the patient's recent/subacute infarct. There is hypoattenuation with some encephalomalacia involving the peripheral right temporal lobe. There is also a small region of hypoattenuation with loss of gray-white differentiation in the peripheral right parietal lobe on image 19. This is of uncertain chronicity.  No acute hemorrhage is identified. There is no mass effect or midline shift. The basilar cisterns remain patent.      03/27/2013   There is patchy hypoattenuation involving the corona radiata, basal ganglia, and right temporal lobe, suggesting remote/subacute ischemia. There is also a smaller focus of loss of gray-white differentiation involving  the peripheral right temporal lobe which may be subacute as well, but this is age indeterminant. Correlation with prior studies or MRI would be helpful to better gauge the chronicity of these abnormalities. There is no acute intracranial hemorrhage or mass effect.  Urgent findings were discussed with Dr. Cassandria Santee at 7:28 PM on 03/27/2013  Nicoletta Dress, MD  03/27/2013 7:50 PM     Chest Ap Portable    03/27/2013  CLINICAL HISTORY: Stroke symptoms  COMPARISON: None  FINDINGS:Portable AP view of the chest semierect:  The lungs are clear. There is no pneumothorax or pleural effusion. The cardiomediastinal contours are within normal limits.       03/27/2013   1.  No acute findings.  Emeline Darling, MD  03/27/2013 7:51 PM       Assessment/Plan:      TIA (transient ischemic attack):  Will check MRI.  Neuro checks.  Tele monitoring.     Dysarthria:  Resolved.  Monitor     Dizziness:  Resolved.  Monitor.  Gentle hydration     UTI (lower urinary  tract infection):  Rocephin IV.  Await urine cx    History of CVA:  NOV 17th.  Similar episode to day of admission     History of DVT (deep vein thrombosis):  Found after stroke in Nov.  xarelto     PFO (patent foramen ovale):  Found after stroke.  xarelto     DM (diabetes mellitus):  accu checks with coverage.     Hyperlipidemia:  Continue statin.  Lipid panel in am     Chronic migraine:  Topamax. PRN pain meds.  monitor    DVT prophylaxis:  SCDs    Signed by: Remigio Eisenmenger, RN, GNP

## 2013-03-28 NOTE — H&P (Signed)
I have directly reviewed the clinical findings, lab, imaging studies and management of this patient in detail. I have interviewed and examined the pt and agree with the documentation,  as recorded by NP.    CC:  Chief Complaint   Patient presents with   . Headache         Labs:   Results     Procedure Component Value Units Date/Time    Glucose Whole Blood - POCT [[id number]] Collected:03/28/13 0753     POCT - Glucose Whole blood 72 mg/dL HCWCBJS:28/31/[birth year] 7616    Glucose Whole Blood - POCT [[id number]]  (Abnormal) Collected:03/28/13 0736     POCT - Glucose Whole blood 67 (L) mg/dL RSWNIOE:70/35/00 9381    GFR [[id number]] Collected:03/28/13 0603     EGFR >60.0 Updated:03/28/13 0649    Basic Metabolic Panel [[id number]]  (Abnormal) Collected:03/28/13 0603    Specimen Information:Blood Updated:03/28/13 0649     Glucose 88 mg/dL      BUN 75.1 mg/dL      Creatinine 0.8 mg/dL      CALCIUM 8.8 mg/dL      Sodium 025 (H) mEq/L      Potassium 3.4 (L) mEq/L      Chloride 114 (H) mEq/L      CO2 22 mEq/L      Anion Gap 10.0     Magnesium [[id number]] Collected:03/28/13 0603    Specimen Information:Blood Updated:03/28/13 0649     Magnesium 1.8 mg/dL     CBC without differential [[id number]]  (Abnormal) Collected:03/28/13 0603    Specimen Information:Blood / Blood Updated:03/28/13 0621     WBC 5.74      RBC 4.01 (L)      Hgb 11.3 (L) g/dL      Hematocrit 54.0 (L) %      MCV 87.0 fL      MCH 28.2 pg      MCHC 32.4 g/dL      RDW 15 %      Platelets 184      MPV 10.7 fL     Lipid panel [[id number]] Collected:03/28/13 0603    Specimen Information:Blood Updated:03/28/13 0603    Narrative:    Fasting specimen    Glucose Whole Blood - POCT [[id number]] Collected:03/28/13 0451     POCT - Glucose Whole blood 90 mg/dL YKDXIPJ:82/50/53 9767    Urine culture [[id number]] Collected:03/27/13 2024    Specimen Information:Urine / Urine, Clean Catch Updated:03/28/13 0045    Microscopic, Urine [[id number]]  (Abnormal) Collected:03/27/13 2025     RBC, UA 0 -  5 Updated:03/27/13 2051     WBC, UA 11 - 25 (A)      Squamous Epithelial Cells, Urine 11 - 25      Urine Bacteria Many (A)     Urinalysis [[id number]]  (Abnormal) Collected:03/27/13 2025    Specimen Information:Urine Updated:03/27/13 2051     Urine Type Clean Catch      Color, UA YELLOW      Clarity, UA CLOUDY (A)      Specific Gravity UA 1.022      Urine pH 7.0      Leukocyte Esterase, UA MODERATE (A)      Nitrite, UA NEGATIVE      Protein, UR NEGATIVE      Glucose, UA NEGATIVE      Ketones UA TRACE      Urobilinogen, UA 0.2 mg/dL      Bilirubin, UA NEGATIVE      Blood, UA SMALL (A)  Comprehensive metabolic panel [[id number]]  (Abnormal) Collected:03/27/13 2005    Specimen Information:Blood Updated:03/27/13 2050     Glucose 203 (H) mg/dL      BUN 82.9 mg/dL      Creatinine 1.0 mg/dL      Sodium 562 mEq/L      Potassium 3.7 mEq/L      Chloride 110 (H) mEq/L      CO2 23 mEq/L      CALCIUM 8.9 mg/dL      Protein, Total 6.3 g/dL      Albumin 3.3 (L) g/dL      AST (SGOT) 14 U/L      ALT 14 U/L      Alkaline Phosphatase 110 U/L      Bilirubin, Total 0.5 mg/dL      Globulin 3.0 g/dL      Albumin/Globulin Ratio 1.1      Anion Gap 10.0     Creatine Kinase (CK) [[id number]] Collected:03/27/13 2005    Specimen Information:Blood Updated:03/27/13 2050     Creatine Kinase (CK) 77 U/L     GFR [[id number]] Collected:03/27/13 2005     EGFR 56.0 Updated:03/27/13 2050    Troponin I [[id number]] Collected:03/27/13 2005    Specimen Information:Blood Updated:03/27/13 2034     Troponin I <0.01 ng/mL     Rapid Influenza A/B Antigens [[id number]] Collected:03/27/13 2006    Specimen Information:Nasopharyngeal / Nasal Wash Updated:03/27/13 2033    Narrative:    ORDER#: [id number]                                    ORDERED BY: Roselee Culver  SOURCE: Nasal Wash                                   COLLECTED:  03/27/13 20:06  ANTIBIOTICS AT COLL.:                                RECEIVED :  03/27/13 20:17  Influenza Rapid Antigen A&B                 FINAL       03/27/13 20:32  03/27/13   Negative for Influenza A and B             Reference Range: Negative      APTT [[id number]] Collected:03/27/13 2005     PTT 33 Updated:03/27/13 2026    Protime-INR [[id number]]  (Abnormal) Collected:03/27/13 2005    Specimen Information:Blood Updated:03/27/13 2026     PT 17.8 (H)      PT INR 1.5      PT Anticoag. Given Within 48 hrs. warfarin ([place]     CBC and differential [[id number]]  (Abnormal) Collected:03/27/13 2005    Specimen Information:Blood / Blood Updated:03/27/13 2013     WBC 6.38      RBC 4.11 (L)      Hgb 11.7 (L) g/dL      Hematocrit 93.2 (L) %      MCV 87.1 fL      MCH 28.5 pg      MCHC 32.7 g/dL      RDW 15 %      Platelets 197      MPV 10.9 fL      Neutrophils 61 %  Lymphocytes Automated 30 %      Monocytes 7 %      Eosinophils Automated 2 %      Basophils Automated 0 %      Immature Granulocyte 0 %      Neutrophils Absolute 3.91      Abs Lymph Automated 1.92      Abs Mono Automated 0.43      Abs Eos Automated 0.10      Absolute Baso Automated 0.02      Absolute Immature Granulocyte 0.01             Physical:  Filed Vitals:    03/28/13 0020 03/28/13 0056 03/28/13 0200 03/28/13 0600   BP: 114/76 115/57 123/60 115/54   Pulse: 48 49 48 50   Temp:  97.7 F (36.5 C) 97.6 F (36.4 C) 97.7 F (36.5 C)   TempSrc:  Temporal Artery Temporal Artery Temporal Artery   Resp:  17 16 18    Height:  1.702 m (5' 7.01")     Weight:  113.399 kg (250 lb)     SpO2: 97% 96% 99% 98%     Intake and Output Summary (Last 24 hours) at Date Time  No intake or output data in the 24 hours ending 03/28/13 0919    Physical Exam   Nursing note and vitals reviewed.   Constitutional: She is oriented to person, place, and time and well-developed, well-nourished, and in no distress.   Cardiovascular: Normal rate, regular rhythm, normal heart sounds and intact distal pulses. Exam reveals no gallop and no friction rub. No murmur heard. No carotid bruit.  Pulmonary/Chest: Effort normal and breath  sounds normal. No respiratory distress. She has no wheezes. She exhibits no tenderness.   Musculoskeletal: She exhibits no edema.   Neurological: She is alert and oriented to person, place, and time. No cranial nerve deficit, normal speech. No sensory deficit, positive left sided hemiparesis.    Assessment and plan:  1. TIA (transient ischemic attack) w/ dysarthria and dizziness: stroke protocol, neuro check, monitor on tele, continue lipitor, xarelto, obtain MRI brain, neurology consult.  2. UTI: continue Rocephin IV. Await urine cx   3. History of CVA: continue lipitor, xarelto   4. History of DVT (deep vein thrombosis): continue xarelto   5. PFO (patent foramen ovale): continue xarelto, consider PFO closure if CVA re-occured  6. DM (diabetes mellitus): ISS w/ novolog  7. Hyperlipidemia: Continue statin.   8. Chronic migraine: Topamax. Pain control prn  9. DVT prophylaxis: SCD    Trong Gosling, MD  03/28/2013  9:25 AM

## 2013-03-28 NOTE — Progress Notes (Addendum)
Hospitalist addendum:    Patient states speech much better this am.  No change in mild L hemiparesis as result of CVA in 11/14.  Very claustrophobic, will require sedation by anesthesia if MRI required.  Unable yesterday to get records from [facility], [place], [place].  Family trying again today via their CM from there.  Daughter thinks the clots in her left leg were "superficial"; could have been superficial femoral.    PE  Unchanged.  Speech sounds clear to me.    A/P:  1. Discussed with Dr. Alfredo Martinez.  Will order CTA of head and neck today if can't get records from [place].  2. Continue Xarelto for time being.  3. Get venous Dopplers.  4. Continue SSI for DM.  5. Heart murmur could be from large PFO.  Again, hope to get echo report today from [facility]; will repeat if necessary.  6. Mild pyuria without symptoms; will d/c Rocephin, wait for culture.

## 2013-03-29 LAB — ECG 12-LEAD
Atrial Rate: 60 {beats}/min
P Axis: 61 degrees
P-R Interval: 182 ms
Q-T Interval: 446 ms
QRS Duration: 88 ms
QTC Calculation (Bezet): 446 ms
R Axis: 47 degrees
T Axis: 42 degrees
Ventricular Rate: 60 {beats}/min

## 2013-03-29 LAB — GLUCOSE WHOLE BLOOD - POCT
Whole Blood Glucose POCT: 145 mg/dL — ABNORMAL HIGH (ref 70–100)
Whole Blood Glucose POCT: 165 mg/dL — ABNORMAL HIGH (ref 70–100)

## 2013-03-29 LAB — POTASSIUM: Potassium: 3.8 mEq/L (ref 3.5–5.1)

## 2013-03-29 LAB — MAGNESIUM: Magnesium: 1.7 mg/dL (ref 1.6–2.6)

## 2013-03-29 NOTE — Discharge Summary -  Nursing (Signed)
The discharge instruction reviewed with pt. Understanding verbalized. Pt denied pain. Pt remains the same condition as normal. Home physical therapist set up with the case manager.

## 2013-03-29 NOTE — Discharge Summary (Signed)
DISCHARGE NOTE    Date Time: 03/29/2013  10:15 AM  Patient Name:Katherine [name]  MWN:[id number]  PCP: Veneda Melter, MD  Attending Physician:Ralene Gasparyan Johnsie Cancel M.D.    Date of Admission:   03/27/2013    Date of Discharge:   03/29/2013    Chief Complaint:      Chief Complaint   Patient presents with   . Headache       Reason for Admission:   Anemia [285.9]  Dysarthria [784.51]  History of DVT (deep vein thrombosis) [V12.51]  Cerebrovascular accident within last 3 months [V12.54]  TIA (transient ischemic attack)  Stroke-like symptoms    Discharge Diagnosis:       Principal Problem:   *TIA (transient ischemic attack)  Active Problems:   Dysarthria   History of DVT (deep vein thrombosis)   PFO (patent foramen ovale)   DM (diabetes mellitus)   Hyperlipidemia   Chronic migraine   Dizziness   UTI (lower urinary tract infection)   Stroke-like symptoms        Procedures performed:   CT angiogram of head and neck    Hospital Course:   This patient presented to the ED with slurred speech, similar to part of  the symptoms of her stroke in November 2014.  She was cared for at that  time at a [facility], [facility].  Evaluation there including  TEE revealed a PFO as well as VTE of the lower extremity.  She was placed  on Xarelto as well as high-dose Lipitor.     In hospital, the patient's mild dysarthria resolved.  An MRI was not  performed due to profound claustrophobia.     Dr. Alfredo Martinez of neurology saw the patient in consultation, recommending CT  angiogram of her head and neck.  Those studies revealed moderate to severe  narrowing of the distal right M1 segment extending through the bifurcation  to involve the origin of each M2 trunk, possibly reflecting recanalization  or residual thrombus or atherosclerotic stenosis.  Her right MCA runoff  vasculature was patent, but somewhat diminished in comparison with the  opposite side.  There is also atherosclerotic plaque of both coronary  arteries without  hemodynamically significant stenosis.     The concern was whether her PFO identified in [place] was contributing  to her current symptoms, or if they were simply a residual of the same  process.  Venous Dopplers of her legs were negative.     Dr. Alfredo Martinez recommended not changing the anticoagulant, but continuing  Xarelto as she takes.  His concern was if her PFO required intervention.     I discussed the case with Dr. Amado Nash, cardiology, who recommended an  appointment as an outpatient in their office.  The patient was instructed  to obtain a disk of her TEE from [place] to take with her.    At discharge, the patient felt that she was back at baseline.  Her speech  was clear, and she and her daughter agreed.        Physical Exam:    height is 1.702 m (5' 7.01") and weight is 113.399 kg (250 lb). Her temporal artery temperature is 98 F (36.7 C). Her blood pressure is 129/51 and her pulse is 69. Her respiration is 16 and oxygen saturation is 97%.   Body mass index is 39.15 kg/(m^2).  Filed Vitals:    03/29/13 0600 03/29/13 0839 03/29/13 0900 03/29/13 1000   BP: 110/60   129/51  Pulse: 60 66  69   Temp: 97.3 F (36.3 C)   98 F (36.7 C)   TempSrc: Temporal Artery   Temporal Artery   Resp: 19 16  16    Height:       Weight:       SpO2: 98%  98% 97%     Intake and Output Summary (Last 24 hours) at Date Time    Intake/Output Summary (Last 24 hours) at 03/29/13 1015  Last data filed at 03/29/13 0600   Gross per 24 hour   Intake    240 ml   Output    850 ml   Net   -610 ml       General appearance - overweight and acyanotic, in no respiratory distress  Mental status - alert, oriented to person, place, and time  Chest - clear to auscultation, no wheezes, rales or rhonchi, symmetric air entry  Heart - normal rate, regular rhythm, normal S1, S2, there is a 2/6 systolic flow murmur heard across the upper chest  Neurological - abnormal neurological exam unchanged from prior examinations      Labs:     Results      Procedure Component Value Units Date/Time    Glucose Whole Blood - POCT [[id number]]  (Abnormal) Collected:03/29/13 0706     POCT - Glucose Whole blood 145 (H) mg/dL YQMVHQI:69/62/95 2841    Urine culture [[id number]] Collected:03/27/13 2024    Specimen Information:Urine / Urine, Clean Catch Updated:03/29/13 0707    Narrative:    ORDER#: [id number]                                    ORDERED BY: Roselee Culver  SOURCE: Urine, Clean Catch                           COLLECTED:  03/27/13 20:24  ANTIBIOTICS AT COLL.:                                RECEIVED :  03/28/13 00:45  Culture Urine                              FINAL       03/29/13 07:07  03/29/13   >100,000 CFU/ML Three or more organisms isolated.             No further work due to mixed culture.             Contact the laboratory to prioritize testing if further             workup is clinically indicated.      Potassium [[id number]] Collected:03/29/13 0607    Specimen Information:Blood Updated:03/29/13 0647     Potassium 3.8 mEq/L     Magnesium [[id number]] Collected:03/29/13 0607    Specimen Information:Blood Updated:03/29/13 5188     Magnesium 1.7 mg/dL     Glucose Whole Blood - POCT [[id number]]  (Abnormal) Collected:03/28/13 2104     POCT - Glucose Whole blood 119 (H) mg/dL SWFUXNA:35/57/32 2025    Glucose Whole Blood - POCT [[id number]]  (Abnormal) Collected:03/28/13 1642     POCT - Glucose Whole blood 113 (H) mg/dL EGBTDVV:61/60/73 7106    Lipid panel [[id number]]  (Abnormal) Collected:03/28/13 0603    Specimen  Information:Blood Updated:03/28/13 1133     Cholesterol 116 mg/dL      Triglycerides 92 mg/dL      HDL 29 (L) mg/dL      LDL Calculated 69 mg/dL      VLDL Cholesterol Cal 18 mg/dL      CHOL/HDL Ratio 4.0     Hemolysis index [[id number]]  (Abnormal) Collected:03/28/13 0603     Hemolysis Index 15 (H) Updated:03/28/13 1133    Glucose Whole Blood - POCT [[id number]]  (Abnormal) Collected:03/28/13 1052     POCT - Glucose Whole blood 176 (H) mg/dL YNWGNFA:21/30/86  5784            Rads:   Radiological Procedure reviewed.  Ct Angiogram Head Neck    03/28/2013  CLINICAL HISTORY: 62 year old female patient with history of right MCA territory infarct one month ago. Now with recurrent left-sided deficits. History of diabetes mellitus, hyperlipidemia and patent foramen ovale.  EXAM: CT angiography the cervical vessels and circle of Willis. Unenhanced CT head was first performed utilizing standard technique. Subsequently, thin section axial images from the aortic arch through the circle of Willis to the vertex were performed during the uneventful intravenous administration of 100 cc Visipaque 320 low osmolar iodinated contrast bolus-timed for optimal arterial opacification. Post-processing included 3D volume rendering on a dedicated workstation as well as orthogonal MPR reconstructions. Finally, postcontrast images of the brain were performed utilizing standard technique.  Correlation made with unenhanced CT head dated 03/27/2013.  FINDINGS:  Again demonstrated are patchy foci of low density throughout the right basal ganglia and deep frontal periventricular white matter. There are also triangular foci of low density involving the lateral right temporal lobe and right parietal convexity, compatible with subacute infarcts within the right MCA territory. These findings are stable in comparison to the recent CT. There is no extra-axial fluid collection and the basal cisterns are patent. There is no pathologic enhancement of the brain or leptomeninges following contrast administration.  Visualized paranasal sinuses, middle ear cavities and mastoid air cells are well-pneumatized and clear.  The great vessels are widely patent at the aortic arch and demonstrate normal classic arterial branching. The common carotid arteries are widely patent. There is some shallow soft plaque seen occasionally on the left without focal luminal caliber reduction. There is soft and calcific plaque at each  carotid bulb/bifurcation. On the left, this produces only 10% narrowing of the left ICA origin with respect to the mid/distal left ICA. There is moderate to severe narrowing of the left ECA origin. Plaque on the right produces roughly 10-20% narrowing of the right ICA origin with respect to the mid/distal ICA. There is only mild right ECA origin stenosis. The extracranial cervical internal carotid arteries are tortuous but otherwise widely patent through the skull base.  The horizontal and vertical petrous segments are unremarkable. There is scattered calcific plaque involving each cavernous carotid siphon and paraclinoid segment. This results in mild narrowing along the left carotid siphon and paraclinoid segment. The supraclinoid internal carotid arteries bifurcate into normal appearing anterior and middle cerebral arterial distributions. The left A1 segment is hypoplastic and the left ACA receives primary supply through a patent, well formed anterior communicating artery. There is moderate to severe narrowing of the distal right M1 segment and bifurcation resulting in stenosis of the each M2 trunk origin. The right MCA runoff vasculature is patent, albeit somewhat diminished with respect to the contralateral side.  The right vertebral artery is a dominant, large caliber vessel measuring almost 4  mm in average diameter. This vessel provides primary supply to the basilar artery which bifurcates into normal appearing posterior cerebral arteries. The nondominant left vertebral artery is extremely small in caliber and provides only partial supply to the left posterior inferior cerebellar artery (greater supply to this vessel is via the ipsilateral anterior inferior cerebellar artery). These findings reflect normal anatomic variation.  Only minimal cervical spondylosis is evident. This is manifest primarily as left-sided hypertrophic facet arthrosis at C3-C4. 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 The lung apices are clear.      03/28/2013     1. No change in patchy subacute ischemia involving the right basal ganglia, deep white matter and cerebral hemisphere compatible with a subacute infarct of the right MCA distribution. No new infarct, mass effect, hemorrhage or pathologic enhancement. 2. Moderate to severe narrowing of the distal right M1 segment extending through the bifurcation to involve the origin of each M2 trunk. It is unclear if this reflects recanalization or residual thrombus or atherosclerotic stenosis. There is flow related enhancement throughout the right MCA runoff arterial bed, albeit somewhat diminished with respect to the contralateral side.  3. Atherosclerosis involving each cavernous carotid siphon and paraclinoid segment with mild narrowing on the left. Hypoplastic left A1 segment with the left ACA receiving primary supply via the right carotid artery through a patent well formed anterior communicating artery (normal anatomic variation).  4. Patency of the vertebrobasilar system with anatomic variation as detailed above.  5. Calcific atherosclerotic plaque at each carotid bulb/bifurcation without hemodynamically significant stenosis.  Waynard Edwards, MD  03/28/2013 11:25 PM     Ct Head Without Contrast    03/27/2013  HISTORY: Recent stroke. Recurrent left-sided deficits  TECHNIQUE: Axial CT images of the head were obtained, without intravenous contrast.  COMPARISON: No prior studies are available.  FINDINGS: There is an ill-defined region of patchy hypoattenuation involving the corona radiata and lateral aspect of the basal ganglia on the right, likely corresponding to the patient's recent/subacute infarct. There is hypoattenuation with some encephalomalacia involving the peripheral right temporal lobe. There is also a small region of hypoattenuation with loss of gray-white differentiation in the peripheral right  parietal lobe on image 19. This is of uncertain chronicity.  No acute hemorrhage is identified. There is no mass effect or midline shift. The basilar cisterns remain patent.      03/27/2013   There is patchy hypoattenuation involving the corona radiata, basal ganglia, and right temporal lobe, suggesting remote/subacute ischemia. There is also a smaller focus of loss of gray-white differentiation involving the peripheral right temporal lobe which may be subacute as well, but this is age indeterminant. Correlation with prior studies or MRI would be helpful to better gauge the chronicity of these abnormalities. There is no acute intracranial hemorrhage or mass effect.  Urgent findings were discussed with Dr. Cassandria Santee at 7:28 PM on 03/27/2013  Nicoletta Dress, MD  03/27/2013 7:50 PM     Chest Ap Portable    03/27/2013  CLINICAL HISTORY: Stroke symptoms  COMPARISON: None  FINDINGS:Portable AP view of the chest semierect:  The lungs are clear. There is no pneumothorax or pleural effusion. The cardiomediastinal contours are within normal limits.       03/27/2013   1.  No acute findings.  Emeline Darling, MD  03/27/2013 7:51 PM     US Venous Low Extrem Duplx Dopp Comp Bilat  03/28/2013  HISTORY: History of left lower extremity venous thrombosis.   COMPARISON: None.  TECHNIQUE: Color and duplex Doppler sonography of the veins of the bilateral lower extremities.  FINDINGS: The bilateral common femoral, saphenous-femoral junction, femoral, and popliteal veins were compressible, demonstrated appropriately directed color Doppler flow, and had appropriate response to augmentation. The deep femoral vein and visualized greater saphenous vein also demonstrated appropriately directed color Doppler flow and were without grayscale findings of thrombus. Below-the-knee, the posterior tibialis and peroneal veins were compressible, color Doppler patent, and had appropriately directed flow.      03/28/2013   No thrombus in the imaged veins of  either lower extremity.  Elizebeth Koller, MD  03/28/2013 1:44 PM       Discharge Medications:     Current Discharge Medication List        Current Discharge Medication List      CONTINUE these medications which have NOT CHANGED    Details   atorvastatin (LIPITOR) 80 MG tablet Take 80 mg by mouth daily.      fluticasone (FLOVENT DISKUS) 50 MCG/BLIST diskus inhaler Inhale into the lungs 2 (two) times daily.      Insulin Infusion Pump Device by Does not apply route.      Mometasone Furo-Formoterol Fum 100-5 MCG/ACT Aerosol Inhale into the lungs 2 (two) times daily.      pramipexole (MIRAPEX) 0.25 MG tablet Take 0.25 mg by mouth nightly.      propranolol (INDERAL LA) 60 MG 24 hr capsule Take 60 mg by mouth daily.      rivaroxaban (XARELTO) 20 MG Tab Take 20 mg by mouth daily with dinner.      topiramate (TOPAMAX) 100 MG tablet Take 100 mg by mouth daily.           Current Discharge Medication List        Current Discharge Medication List        Discharge Instructions:   See patient instructions.    Pending Labs:     None.  Discharge Destination:   Home.  Condition at Discharge :   Stable.    Time spent for Discharge Care:   35 minutes.    Follow-up with PCP:   As needed.    Signed by: Cherylynn Ridges, MD  03/29/2013 10:15 AM

## 2013-03-29 NOTE — OT Eval Note (Signed)
[facility]  [address]  [place], [place]. [address]    Department of Rehabilitation Services  [phone]    Occupational Therapy Evaluation    Patient: Katherine [name]    MRN#: [id number]     Time of treatment: Time Calculation  OT Received On: 03/29/13  Start Time: 0900  Stop Time: 0925  Time Calculation (min): 25 min  OT Visit Number: 1    Consult received for Katherine [name] for OT Evaluation and Treatment.  Patient's medical condition is appropriate for Occupational therapy intervention at this time.      Assessment: Katherine [name] is a 62 y.o. female admitted 03/27/2013 presenting with R/o CVA.  Impairments: Assessment: decreased strength;decreased ROM;decreased independence with ADLs    Therapy Diagnosis: generalized weakness and generalized neurological changes     Rehabilitation Potential: Prognosis: Good    Plan: OT Frequency Recommended: 1-2x/wk   Treatment Interventions: ADL retraining;UE strengthening/ROM;Patient/Family training;Neuro muscular reeducation;Fine motor coordination activities     Patient Goal  Patient Goal: to go home     Risks/benefits/POC discussed patient    Goals:   Goal Formulation: Patient  Time For Goal Achievement: by time of discharge  ADL Goals  Patient will groom self: independently;at sinkside;3 visits  Patient will dress lower body: with supervision;3 visits     Neuro Re-Ed Goals  Pt will utilize affected limb as a functional assist: independently;L UE;to increase engagemenrt of affected UE;3 visits                        Discharge Recommendation: Home with home health OT            Precautions and Contraindications: fall risk         Medical Diagnosis: Anemia [285.9]  Dysarthria [784.51]  History of DVT (deep vein thrombosis) [V12.51]  Cerebrovascular accident within last 3 months [V12.54]  TIA (transient ischemic attack)  Stroke-like symptoms    History of Present Illness: Katherine [name] is a 62 y.o. female admitted on 03/27/2013 with   headache  and dizziness.  Pt reports that she moved here from [facility] after having a stroke on Nov 17th.  She was discharged from a rehab facility there on Dec 20th and came here to live with her daughter.  On day of admission, she was getting ready to go to the store with her daughter and started to feel dizzy and unsteady.  She ambulates with a cane, so she sat down into her wheelchair.  She reports having a headache most of the am prior to this.  She associated some nausea as well, but did not vomit.  Also noted a change to her voice and she could not get words out the way she wanted.  These symptoms lasted about 15 mins.  She states this was very similar to the symptoms she had with her stroke, so she felt like she should come in and be evaluated.  Symptoms have resolved and she feels back to her baseline at this point in time.  per H and P    Patient Active Problem List   Diagnosis   . TIA (transient ischemic attack)   . Dysarthria   . History of DVT (deep vein thrombosis)   . PFO (patent foramen ovale)   . DM (diabetes mellitus)   . Hyperlipidemia   . Chronic migraine   . Dizziness   . UTI (lower urinary tract infection)   . Stroke-like symptoms  Past Medical/Surgical History:  Past Medical History   Diagnosis Date   . Cerebrovascular accident    . Migraine    . PFO (patent foramen ovale)    . Diabetes mellitus    . Hyperlipidemia    . Seasonal allergies       Past Surgical History   Procedure Date   . Cholecystectomy    . Hysterectomy    . Total abdominal hysterectomy w/ bilateral salpingoophorectomy    . Carpal tunnel release      L         X-Rays/Tests/Labs:  No change in patchy subacute ischemia involving the right basal  ganglia, deep white matter and cerebral hemisphere compatible with a  subacute infarct of the right MCA distribution. No new infarct, mass  effect, hemorrhage or pathologic enhancement.      Social History:  Prior Level of Function  Prior level of function: Ambulates with assistive  device;Independent with ADLs  Assistive Device: Quad cane;Wheelchair  Baseline Activity Level: Household ambulation  Driving: does not drive (not driving currently due to stroke)  Cooking: No  Employment: Unemployed  DME Currently at Home: Quad cane;Wheelchair-manual  Home Living Arrangements  Living Arrangements: Children (moved from [place] to live with dtr, son-in-law (both worgrandson)  Type of Home: House  Home Layout: Multi-level;Access;Stairs to enter with rails (add number in comment) (5 steps to enter with rail, bed/bath in basement with full f)  Bathroom Shower/Tub: Pension scheme manager: Midwife: Academic librarian Accessibility: Accessible  DME Currently at Home: Quad cane;Wheelchair-manual  Home Living - Notes / Comments: was just d/c'd from rehab in [place] 1 wk PTA to her dtr's house here in [place]    Subjective: Patient is agreeable to participation in the therapy session.  Subjective: "I am feeling like myself again for the most part."    .        Objective:  Observation of Patient/Vital Signs:  Patient is in bed with telemetry in place.    Inspection/Posture: L UE remains with significant tonal changes with emerging movement with shoulder/scapular elevation and elbow flexion. Lack of movement in wrist or hand at this time. Tone more in fingers than in elbow. Notable for slight shoulder sublux. [name] over anterior aspect at this time.     Cognition  Arousal/Alertness: Appropriate responses to stimuli  Attention Span: Appears intact  Orientation Level: Oriented X4  Memory: Appears intact  Following Commands: independent  Safety Awareness: independent  Insights: Fully aware of deficits  Problem Solving: Able to problem solve independently  Neuro Status  Behavior: calm cooperative  Motor Planning: intact  Coordination: FMC impaired (L hand due to prior to CVA )  Hand Dominance: right handed    Gross ROM  Right Upper Extremity ROM: within functional limits  Gross  Strength  Right Upper Extremity Strength: within functional limits     Tone:  (tonal changes throughout L UE )    Sensory  Auditory: intact  Tactile - Light Touch: intact  Visual Acuity: wears glasses       Self-care and Home Management  Eating: Setup;at edge of bed;independently (struggles with B hands tasks )  LE Dressing: minimal assistance;Pull up over hips (on L side )  Toileting: independently    Mobility and Transfers  Sit to Supine: Independently (with increased time to get B LE into the bed )  Bed to [facility]: Independently          Participation  and Endurance  Participation Effort: excellent    Treatment Activities: Instructed pt through B UE AROM exercises through shoulder, elbow, wrist and hand through all planes of motion for one set of five with focus on proper pacing and full ROM. Instructed pt to complete twice a day to increase general strength and increase independence with ADL's and mobility. Educated patient in the importance of repetition with [facility] exercises. Instructed patient through one set of 5 of finger opposition (focused on accuracy of tip to tip), gross grasp with the usage of putty or resistance, fasteners including buttons, and other tasks of daily living that patient completed PTA such as hobbies and crafts to increase overall muscular strength and FMC. Patient demonstrated independence with HEP and verbalized understanding.          Educated the patient to role of occupational therapy, plan of care, goals of therapy and HEP, safety with mobility and ADLs.        Lear Ng, OTR/L  Acute Care Rehabilitation Clinical Coordinator  Pager [phone]  Ext. [phone]

## 2013-03-29 NOTE — Plan of Care (Signed)
MODIFIED RANKIN SCALE    0 No symptoms at all.    1 No significant disability despite symptoms; able to carry out all usual duties and activities.    2 Slight disability; unable to carry out all previous activities, but able to look after own affairs without assistance.    3 Moderate disability; requiring some help, but able to walk without assistance.    4 Moderately severe disability; unable to walk without assistance and unable to attend to own bodily needs without assistance.    5 Severe disability; bedridden, incontinent and requiring constant nursing care and attention.    6 Dead.      TOTAL (0-6): ___4____

## 2013-03-29 NOTE — Plan of Care (Signed)
Problem: Urinary Incontinence  Goal: Perineal skin integrity is maintained or improved  Assess genitourinary system, perineal skin, labs (urinalysis), and history of incontinence to include past management, aggravating, and alleviating factors. Collaborate with interdisciplinary team and initiate plans and interventions as needed.   Outcome: Completed Date Met:  03/29/13  Pt is continent.

## 2013-03-29 NOTE — Discharge Instructions (Addendum)
1.  PLEASE MAKE AN APPOINTMENT WITH DR. BAZAZ, CARDIOLOGY, OR SOMEONE IN HIS OFFICE, REGARDING YOUR PFO.    2.  YOU WILL NEED TO OBTAIN A DISC OF THE TEE DONE IN THE [facility].  CALL YOUR HOSPITAL CONTACT THERE TO DO THIS.  TAKE THE DISC WITH YOU TO YOUR CARDIOLOGY APPOINTMENT.    3.  DR. Alfredo Martinez, YOUR NEUROLOGIST, WANTS TO SEE YOU IN 2 WEEKS.  PLEASE MAKE AN APPOINTMENT WITH HIS OFFICE.    Name of Home Health Agency: [facility] [phone]

## 2013-03-30 NOTE — Progress Notes (Signed)
Home Health Referral          Referral from [facility] (Case Manager) for home health care upon discharge.    By [employer], the patient has the right to freely choose a home care provider.  Arrangements have been made with:     A company of the patients choosing. We have supplied the patient with a listing of providers in your area who asked to be included and participate in Medicare.   [facility], a home care agency that provides both adult home care services which is a wholly owned and operated by [employer] and participates in [employer]   The preferred provider of your insurance company. Choosing a home care provider other than your insurance company's preferred provider may affect your insurance coverage.    The Home Health Care Referral Form acknowledging the voluntary selection of the home care company has been completed, signed, and is on file.      Home Health Discharge Information     Your doctor has ordered Skilled Nursing, Physical Therapy and Occupational Therapy in-home service(s) for you while you recuperate at home, to assist you in the transition from hospital to home.      The agency that you or your representative chose to provide the service:  Name of Home Health Agency: [facility] [phone]      The above services were set up by:  Reece Levy ,RN [facility] Liaison)   Phone [phone]                                                  Signed by: Reece Levy  Date Time: 03/30/2013 8:42 AM

## 2013-04-22 ENCOUNTER — Encounter: Payer: Managed Care, Other (non HMO) | Attending: Physical Medicine & Rehabilitation

## 2013-04-22 ENCOUNTER — Inpatient Hospital Stay: Payer: Managed Care, Other (non HMO) | Admitting: Physical Medicine & Rehabilitation

## 2013-04-24 ENCOUNTER — Inpatient Hospital Stay
Admission: EM | Admit: 2013-04-24 | Discharge: 2013-04-26 | DRG: 919 | Disposition: A | Discharge status: Home / Self Care | Payer: Commercial Managed Care - POS | Attending: Internal Medicine | Admitting: Internal Medicine

## 2013-04-24 ENCOUNTER — Inpatient Hospital Stay: Payer: Commercial Managed Care - POS

## 2013-04-24 ENCOUNTER — Inpatient Hospital Stay: Payer: Commercial Managed Care - POS | Admitting: Internal Medicine

## 2013-04-24 DIAGNOSIS — Z794 Long term (current) use of insulin: Secondary | ICD-10-CM

## 2013-04-24 DIAGNOSIS — G43709 Chronic migraine without aura, not intractable, without status migrainosus: Secondary | ICD-10-CM | POA: Diagnosis present

## 2013-04-24 DIAGNOSIS — R1110 Vomiting, unspecified: Secondary | ICD-10-CM

## 2013-04-24 DIAGNOSIS — Z9089 Acquired absence of other organs: Secondary | ICD-10-CM

## 2013-04-24 DIAGNOSIS — Z90710 Acquired absence of both cervix and uterus: Secondary | ICD-10-CM

## 2013-04-24 DIAGNOSIS — R29898 Other symptoms and signs involving the musculoskeletal system: Secondary | ICD-10-CM | POA: Diagnosis present

## 2013-04-24 DIAGNOSIS — T85694A Other mechanical complication of insulin pump, initial encounter: Principal | ICD-10-CM | POA: Diagnosis present

## 2013-04-24 DIAGNOSIS — E1010 Type 1 diabetes mellitus with ketoacidosis without coma: Secondary | ICD-10-CM | POA: Diagnosis present

## 2013-04-24 DIAGNOSIS — Z87440 Personal history of urinary (tract) infections: Secondary | ICD-10-CM

## 2013-04-24 DIAGNOSIS — Z9641 Presence of insulin pump (external) (internal): Secondary | ICD-10-CM

## 2013-04-24 DIAGNOSIS — Z833 Family history of diabetes mellitus: Secondary | ICD-10-CM

## 2013-04-24 DIAGNOSIS — Z880 Allergy status to penicillin: Secondary | ICD-10-CM

## 2013-04-24 DIAGNOSIS — Y838 Other surgical procedures as the cause of abnormal reaction of the patient, or of later complication, without mention of misadventure at the time of the procedure: Secondary | ICD-10-CM | POA: Diagnosis present

## 2013-04-24 DIAGNOSIS — E119 Type 2 diabetes mellitus without complications: Secondary | ICD-10-CM | POA: Diagnosis present

## 2013-04-24 DIAGNOSIS — Z86718 Personal history of other venous thrombosis and embolism: Secondary | ICD-10-CM

## 2013-04-24 DIAGNOSIS — R578 Other shock: Secondary | ICD-10-CM | POA: Diagnosis present

## 2013-04-24 DIAGNOSIS — N289 Disorder of kidney and ureter, unspecified: Secondary | ICD-10-CM | POA: Diagnosis present

## 2013-04-24 DIAGNOSIS — E1110 Type 2 diabetes mellitus with ketoacidosis without coma: Secondary | ICD-10-CM | POA: Diagnosis present

## 2013-04-24 DIAGNOSIS — I69998 Other sequelae following unspecified cerebrovascular disease: Secondary | ICD-10-CM

## 2013-04-24 DIAGNOSIS — Z823 Family history of stroke: Secondary | ICD-10-CM

## 2013-04-24 DIAGNOSIS — E785 Hyperlipidemia, unspecified: Secondary | ICD-10-CM | POA: Diagnosis present

## 2013-04-24 DIAGNOSIS — I69992 Facial weakness following unspecified cerebrovascular disease: Secondary | ICD-10-CM

## 2013-04-24 DIAGNOSIS — E876 Hypokalemia: Secondary | ICD-10-CM | POA: Diagnosis present

## 2013-04-24 DIAGNOSIS — A0472 Enterocolitis due to Clostridium difficile, not specified as recurrent: Secondary | ICD-10-CM | POA: Diagnosis present

## 2013-04-24 LAB — GLUCOSE WHOLE BLOOD - POCT
Whole Blood Glucose POCT: 135 mg/dL — ABNORMAL HIGH (ref 70–100)
Whole Blood Glucose POCT: 149 mg/dL — ABNORMAL HIGH (ref 70–100)
Whole Blood Glucose POCT: 150 mg/dL — ABNORMAL HIGH (ref 70–100)
Whole Blood Glucose POCT: 187 mg/dL — ABNORMAL HIGH (ref 70–100)
Whole Blood Glucose POCT: 198 mg/dL — ABNORMAL HIGH (ref 70–100)
Whole Blood Glucose POCT: 207 mg/dL — ABNORMAL HIGH (ref 70–100)
Whole Blood Glucose POCT: 217 mg/dL — ABNORMAL HIGH (ref 70–100)
Whole Blood Glucose POCT: 241 mg/dL — ABNORMAL HIGH (ref 70–100)
Whole Blood Glucose POCT: 385 mg/dL — ABNORMAL HIGH (ref 70–100)

## 2013-04-24 LAB — PT AND APTT
PT INR: 1.5
PT: 17.4 — ABNORMAL HIGH (ref 12.6–15.0)
PTT: 30 (ref 23–37)

## 2013-04-24 LAB — CBC AND DIFFERENTIAL
Basophils Absolute Automated: 0.02 10*3/uL (ref 0.00–0.20)
Basophils Automated: 0 %
Eosinophils Absolute Automated: 0.02 10*3/uL (ref 0.00–0.70)
Eosinophils Automated: 0 %
Hematocrit: 40 % (ref 37.0–47.0)
Hgb: 13.6 g/dL (ref 12.0–16.0)
Immature Granulocytes Absolute: 0.02 10*3/uL
Immature Granulocytes: 0 %
Lymphocytes Absolute Automated: 0.75 10*3/uL (ref 0.50–4.40)
Lymphocytes Automated: 11 %
MCH: 29.2 pg (ref 28.0–32.0)
MCHC: 34 g/dL (ref 32.0–36.0)
MCV: 86 fL (ref 80.0–100.0)
MPV: 11.5 fL (ref 9.4–12.3)
Monocytes Absolute Automated: 0.51 10*3/uL (ref 0.00–1.20)
Monocytes: 8 %
Neutrophils Absolute: 5.34 10*3/uL (ref 1.80–8.10)
Neutrophils: 80 %
Platelets: 229 10*3/uL (ref 140–400)
RBC: 4.65 10*6/uL (ref 4.20–5.40)
RDW: 17 % — ABNORMAL HIGH (ref 12–15)
WBC: 6.64 10*3/uL (ref 3.50–10.80)

## 2013-04-24 LAB — URINE MICROSCOPIC

## 2013-04-24 LAB — URINALYSIS
Glucose, UA: 500 — AB
Ketones UA: >=80 — AB
Leukocyte Esterase, UA: NEGATIVE
Nitrite, UA: NEGATIVE
Specific Gravity UA: 1.034 (ref 1.001–1.035)
Urine pH: 6 (ref 5.0–8.0)
Urobilinogen, UA: 0.2 mg/dL (ref 0.2–2.0)

## 2013-04-24 LAB — COMPREHENSIVE METABOLIC PANEL
ALT: 16 U/L (ref 0–55)
AST (SGOT): 16 U/L (ref 5–34)
Albumin/Globulin Ratio: 1 (ref 0.9–2.2)
Albumin: 3.3 g/dL — ABNORMAL LOW (ref 3.5–5.0)
Alkaline Phosphatase: 129 U/L (ref 40–150)
Anion Gap: 20 — ABNORMAL HIGH (ref 5.0–15.0)
BUN: 6.9 mg/dL — ABNORMAL LOW (ref 7.0–19.0)
Bilirubin, Total: 0.5 mg/dL (ref 0.2–1.2)
CO2: 13 mEq/L — ABNORMAL LOW (ref 22–29)
Calcium: 9.3 mg/dL (ref 8.5–10.5)
Chloride: 106 mEq/L (ref 98–107)
Creatinine: 1.4 mg/dL — ABNORMAL HIGH (ref 0.6–1.0)
Globulin: 3.3 g/dL (ref 2.0–3.6)
Glucose: 439 mg/dL — ABNORMAL HIGH (ref 70–100)
Potassium: 3.8 mEq/L (ref 3.5–5.1)
Protein, Total: 6.6 g/dL (ref 6.0–8.3)
Sodium: 139 mEq/L (ref 136–145)

## 2013-04-24 LAB — B-TYPE NATRIURETIC PEPTIDE: B-Natriuretic Peptide: 105.7 pg/mL — ABNORMAL HIGH (ref 0.0–100.0)

## 2013-04-24 LAB — LIPASE: Lipase: 12 U/L (ref 8–78)

## 2013-04-24 LAB — ACETONE: Acetone Blood: POSITIVE — AB

## 2013-04-24 LAB — GFR: EGFR: 38

## 2013-04-24 MED ORDER — SODIUM CHLORIDE 0.9 % IV BOLUS
1000.0000 mL | Freq: Once | INTRAVENOUS | Status: AC
Start: 2013-04-24 — End: 2013-04-24
  Administered 2013-04-24: 1000 mL via INTRAVENOUS

## 2013-04-24 MED ORDER — DEXTROSE 50 % IV SOLN
25.0000 mL | INTRAVENOUS | Status: DC | PRN
Start: 2013-04-24 — End: 2013-04-25

## 2013-04-24 MED ORDER — PROMETHAZINE HCL 25 MG/ML IJ SOLN
12.5000 mg | Freq: Once | INTRAMUSCULAR | Status: DC
Start: 2013-04-24 — End: 2013-04-24

## 2013-04-24 MED ORDER — SODIUM CHLORIDE 0.9 % IV SOLN
0.0000 [IU]/h | INTRAVENOUS | Status: DC
Start: 2013-04-24 — End: 2013-04-25
  Administered 2013-04-25: 2 [IU]/h via INTRAVENOUS

## 2013-04-24 MED ORDER — PRAMIPEXOLE DIHYDROCHLORIDE 0.25 MG PO TABS
0.2500 mg | ORAL_TABLET | Freq: Three times a day (TID) | ORAL | Status: DC | PRN
Start: 2013-04-24 — End: 2013-04-24

## 2013-04-24 MED ORDER — SODIUM CHLORIDE 0.9 % IV SOLN
0.0500 [IU]/kg/h | INTRAVENOUS | Status: DC
Start: 2013-04-24 — End: 2013-04-24
  Administered 2013-04-24: 5 [IU]/h via INTRAVENOUS
  Administered 2013-04-24: 0.05 [IU]/kg/h via INTRAVENOUS
  Filled 2013-04-24: qty 1

## 2013-04-24 MED ORDER — PRAMIPEXOLE DIHYDROCHLORIDE 0.25 MG PO TABS
0.2500 mg | ORAL_TABLET | Freq: Every evening | ORAL | Status: DC | PRN
Start: 2013-04-24 — End: 2013-04-25
  Administered 2013-04-24 – 2013-04-25 (×2): 0.25 mg via ORAL
  Filled 2013-04-24: qty 1

## 2013-04-24 MED ORDER — INSULIN REGULAR HUMAN 100 UNIT/ML IJ SOLN
5.0000 [IU] | Freq: Once | INTRAMUSCULAR | Status: AC
Start: 2013-04-24 — End: 2013-04-24
  Administered 2013-04-24: 5 [IU] via INTRAVENOUS
  Filled 2013-04-24: qty 15

## 2013-04-24 MED ORDER — PRAMIPEXOLE DIHYDROCHLORIDE 0.25 MG PO TABS
0.2500 mg | ORAL_TABLET | Freq: Every evening | ORAL | Status: DC
Start: 2013-04-24 — End: 2013-04-26
  Administered 2013-04-24 – 2013-04-25 (×3): 0.25 mg via ORAL
  Filled 2013-04-24 (×5): qty 1

## 2013-04-24 MED ORDER — SODIUM CHLORIDE 0.9 % IV SOLN
INTRAVENOUS | Status: DC
Start: 2013-04-24 — End: 2013-04-24

## 2013-04-24 MED ORDER — PNEUMOCOCCAL VAC POLYVALENT 25 MCG/0.5ML IJ INJ
0.5000 mL | INJECTION | INTRAMUSCULAR | Status: AC | PRN
Start: 2013-04-24 — End: 2013-04-26
  Administered 2013-04-26: 0.5 mL via INTRAMUSCULAR
  Filled 2013-04-24 (×3): qty 0.5

## 2013-04-24 MED ORDER — DEXTROSE 50 % IV SOLN
50.0000 mL | INTRAVENOUS | Status: DC | PRN
Start: 2013-04-24 — End: 2013-04-25

## 2013-04-24 NOTE — Plan of Care (Signed)
Home Mirapex medication ordered.

## 2013-04-24 NOTE — Progress Notes (Signed)
Pt admitted at 1800 to room IC-02 from ERL.  Alert and oriented x4.  No voiced complaints.  Blood sugar taken and it was 135.  Insulin adjusted per protocol and D5/NS started at 150cc/hr.  Admission completed and pt's daughter is at the bedside and will be her [facility].  Karna Christmas

## 2013-04-24 NOTE — ED Provider Notes (Signed)
Physician/Midlevel provider first contact with patient: 04/24/13 1340         History     Chief Complaint   Patient presents with   . Emesis   . 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 No abd pain, no f/c, no dysuria, no sore throat.  Pt reports no changes in her diet, no sick contacts and no recent abx. No diarrhea or constipation.   Pt reports every time she eats solid food she vomits, liquids are ok, she tolerates them better. Food also tastes different for last 2 days.    Pt was diagnosed recently with L leg DVT. Chronic swelling to B lower legs, L worse    Pt reports similar above vomiting incident happened 2 wks ago, lasted few days and then spontaneously resolved.    Patient is a 64 y.o. female presenting with vomiting and hyperglycemia. The history is provided by the patient.   Emesis   Pertinent negatives include no abdominal pain, no chills, no cough, no diarrhea and no fever.   Hyperglycemia  Associated symptoms include nausea and vomiting. Pertinent negatives include no abdominal pain, chest pain, chills, coughing, fever, numbness, rash, sore throat or weakness.       Past Medical History   Diagnosis Date   . Cerebrovascular accident    . Migraine    . PFO (patent foramen ovale)    . Diabetes mellitus    . Hyperlipidemia    . Seasonal allergies        Past Surgical History   Procedure Date   . Cholecystectomy    . Hysterectomy    . Total abdominal hysterectomy w/ bilateral salpingoophorectomy    . Carpal tunnel release      L       Family History   Problem Relation Age of Onset   . Diabetes Mother    . Cancer Mother    . COPD Father    . Cancer Sister    . Stroke Paternal Grandfather        Social  History   Substance Use Topics   . Smoking status: Passive Smoke Exposure - Never Smoker   . Smokeless tobacco: Never Used   . Alcohol Use: Yes      Comment: socially--  maybe once or twice a month       .     Allergies   Allergen Reactions   . Penicillins Rash       Current/Home Medications    ATORVASTATIN (LIPITOR) 80 MG TABLET    Take 80 mg by mouth daily.    INSULIN INFUSION PUMP DEVICE    by Does not apply route.    PRAMIPEXOLE (MIRAPEX) 0.25 MG TABLET    Take 0.25 mg by mouth nightly.    RIVAROXABAN (XARELTO) 20 MG TAB    Take 20 mg by mouth daily with dinner.        Review of Systems   Constitutional: Negative for fever and chills.   HENT: Negative for sore throat.    Respiratory: Negative for cough.    Cardiovascular: Positive for leg swelling. Negative for chest pain and palpitations.   Gastrointestinal: Positive for nausea and vomiting. Negative for abdominal pain, diarrhea and constipation.   Genitourinary: Negative.    Musculoskeletal: Negative for back pain.  Skin: Negative for rash.   Neurological: Negative for weakness and numbness.   Hematological: Negative.    Psychiatric/Behavioral: Negative.    All other systems reviewed and are negative.        Physical Exam    BP: 144/64 mmHg, Heart Rate: 95 , Temp: 97.7 F (36.5 C), Resp Rate: 18 , SpO2: 98 %, Weight: 99.791 kg    Physical Exam   Nursing note and vitals reviewed.  Constitutional: She is oriented to person, place, and time. She appears well-developed and well-nourished. No distress.   HENT:   Head: Normocephalic and atraumatic.   Right Ear: External ear normal.   Left Ear: External ear normal.   Nose: Nose normal.   Mouth/Throat: Oropharynx is clear and moist. No oropharyngeal exudate.        Possible oral candidiasis noted. There is some white exudate on the tongue, able to scrape off some with tongue blade, no exudate noted to the back of the oropharynx however.  Oral mucosa is somewhat dry.    Eyes: Conjunctivae normal and EOM are normal. Pupils are equal, round, and reactive to light.   Neck: Normal range of motion. Neck supple.   Cardiovascular: Normal rate, regular rhythm, normal heart sounds and intact  distal pulses.    Pulmonary/Chest: Effort normal and breath sounds normal.   Abdominal: Soft. Bowel sounds are normal. There is no tenderness.   Musculoskeletal: She exhibits edema. She exhibits no tenderness.        L arm is contracted especially in the hand/wrist area. Pt states this is due to last CVA which affected L side.   B LE there is a 1+ pitting edema, legs are warm, cap refill brisk and pulses 2+   Neurological: She is alert and oriented to person, place, and time.   Skin: Skin is warm and dry. She is not diaphoretic.   Psychiatric: She has a normal mood and affect. Her behavior is normal. Judgment and thought content normal.       MDM and ED Course     ED Medication Orders      Start     Status Ordering Provider    04/24/13 1626   sodium chloride 0.9 % bolus 1,000 mL   Once      Route: Intravenous  Ordered Dose: 1,000 mL         Last MAR action:  Stopped Mima Cranmore J    04/24/13 1515   insulin regular (HumuLIN R,NovoLIN R) injection 5 Units   Once      Route: Intravenous  Ordered Dose: 5 Units         Last MAR action:  Given Paula Compton    04/24/13 1515      Continuous,   Status:  Discontinued      Route: Intravenous         Discontinued Jayce Boyko J    04/24/13 1514      Continuous,   Status:  Discontinued      Route: Intravenous  Ordered Dose: 0.05 Units/kg/hr         Discontinued Doraine Schexnider J    04/24/13 1343   sodium chloride 0.9 % bolus 1,000 mL   Once      Route: Intravenous  Ordered Dose: 1,000 mL         Last MAR action:  Bronson Curb J    04/24/13 1343      Once,   Status:  Discontinued  Route: Intravenous  Ordered Dose: 12.5 mg         Discontinued Kellan Boehlke J                 MDM  Number of Diagnoses or Management Options  DKA (diabetic ketoacidoses):   DM (diabetes mellitus):   Renal insufficiency:   Vomiting:   Diagnosis management comments: The attending signature signifies review and agreement of the history, physical examination, evaluation, clinical  impression and plan except as noted.     I, Phyllis Ginger PA-C, have been the primary provider for Philippa Sicks during this Emergency Dept visit.    Oxygen saturation by pulse oximetry is 95%-100%, Normal.  Interventions: None Needed.    EKG Interpretation    Rate: Normal  Rhythm: sinus rhythm  Axis: Normal  ST-T Segments: no ST elevation   Conduction: No blocks  Impression: Non-specific EKG    Xr Chest Ap Portable    04/24/2013   No active disease in the chest.  Lanier Ensign, MD  04/24/2013 5:08 PM     4:28 PM  Pt reports she is feeling well.  Dr. Nedra Hai will accept pt to ICU.  Pt agrees with admission plan.          Amount and/or Complexity of Data Reviewed  Clinical lab tests: ordered and reviewed  Tests in the radiology section of CPT: ordered and reviewed  Obtain history from someone other than the patient: yes  Discuss the patient with other providers: yes  Independent visualization of images, tracings, or specimens: yes    Risk of Complications, Morbidity, and/or Mortality  Presenting problems: high  Diagnostic procedures: high  Management options: high    Critical Care  Total time providing critical care: 30-74 minutes        Procedures    Clinical Impression & Disposition     Clinical Impression  Final diagnoses:   DKA (diabetic ketoacidoses)   Renal insufficiency   Vomiting   DM (diabetes mellitus)        ED Disposition     Admit Bed Type: ICU [10]  Admitting Physician: Lawernce Ion [[id number]]  Patient Class: Inpatient [101]             New Prescriptions    No medications on file                 Paula Compton, [place]  04/24/13 2210

## 2013-04-24 NOTE — Plan of Care (Signed)
SCDs ordered for DVT prophylaxis.

## 2013-04-24 NOTE — ED Notes (Signed)
Recurrent episode of vomiting, it happened 2 weeks ago and resolved, started again 2 days ago; blood sugar 398 today; not able to tolerated anything po

## 2013-04-25 LAB — BASIC METABOLIC PANEL
Anion Gap: 10 (ref 5.0–15.0)
Anion Gap: 10 (ref 5.0–15.0)
BUN: 5 mg/dL — ABNORMAL LOW (ref 7.0–19.0)
BUN: 4.2 mg/dL — ABNORMAL LOW (ref 7.0–19.0)
CO2: 18 mEq/L — ABNORMAL LOW (ref 22–29)
CO2: 20 mEq/L — ABNORMAL LOW (ref 22–29)
Calcium: 8.1 mg/dL — ABNORMAL LOW (ref 8.5–10.5)
Calcium: 8.6 mg/dL (ref 8.5–10.5)
Chloride: 114 mEq/L — ABNORMAL HIGH (ref 98–107)
Chloride: 111 mEq/L — ABNORMAL HIGH (ref 98–107)
Creatinine: 0.9 mg/dL (ref 0.6–1.0)
Creatinine: 1 mg/dL (ref 0.6–1.0)
Glucose: 256 mg/dL — ABNORMAL HIGH (ref 70–100)
Glucose: 245 mg/dL — ABNORMAL HIGH (ref 70–100)
Potassium: 2.8 mEq/L — CL (ref 3.5–5.1)
Potassium: 3.3 mEq/L — ABNORMAL LOW (ref 3.5–5.1)
Sodium: 142 mEq/L (ref 136–145)
Sodium: 141 mEq/L (ref 136–145)

## 2013-04-25 LAB — GLUCOSE WHOLE BLOOD - POCT
Whole Blood Glucose POCT: 198 mg/dL — ABNORMAL HIGH (ref 70–100)
Whole Blood Glucose POCT: 204 mg/dL — ABNORMAL HIGH (ref 70–100)
Whole Blood Glucose POCT: 205 mg/dL — ABNORMAL HIGH (ref 70–100)
Whole Blood Glucose POCT: 212 mg/dL — ABNORMAL HIGH (ref 70–100)
Whole Blood Glucose POCT: 223 mg/dL — ABNORMAL HIGH (ref 70–100)
Whole Blood Glucose POCT: 224 mg/dL — ABNORMAL HIGH (ref 70–100)
Whole Blood Glucose POCT: 225 mg/dL — ABNORMAL HIGH (ref 70–100)
Whole Blood Glucose POCT: 228 mg/dL — ABNORMAL HIGH (ref 70–100)
Whole Blood Glucose POCT: 229 mg/dL — ABNORMAL HIGH (ref 70–100)
Whole Blood Glucose POCT: 233 mg/dL — ABNORMAL HIGH (ref 70–100)
Whole Blood Glucose POCT: 239 mg/dL — ABNORMAL HIGH (ref 70–100)
Whole Blood Glucose POCT: 240 mg/dL — ABNORMAL HIGH (ref 70–100)
Whole Blood Glucose POCT: 242 mg/dL — ABNORMAL HIGH (ref 70–100)
Whole Blood Glucose POCT: 269 mg/dL — ABNORMAL HIGH (ref 70–100)
Whole Blood Glucose POCT: 269 mg/dL — ABNORMAL HIGH (ref 70–100)

## 2013-04-25 LAB — CBC
Hematocrit: 32.5 % — ABNORMAL LOW (ref 37.0–47.0)
Hgb: 11 g/dL — ABNORMAL LOW (ref 12.0–16.0)
MCH: 28.9 pg (ref 28.0–32.0)
MCHC: 33.8 g/dL (ref 32.0–36.0)
MCV: 85.5 fL (ref 80.0–100.0)
MPV: 10.8 fL (ref 9.4–12.3)
Platelets: 180 10*3/uL (ref 140–400)
RBC: 3.8 10*6/uL — ABNORMAL LOW (ref 4.20–5.40)
RDW: 17 % — ABNORMAL HIGH (ref 12–15)
WBC: 5.69 10*3/uL (ref 3.50–10.80)

## 2013-04-25 LAB — MAGNESIUM
Magnesium: 1.5 mg/dL — ABNORMAL LOW (ref 1.6–2.6)
Magnesium: 1.9 mg/dL (ref 1.6–2.6)

## 2013-04-25 LAB — PHOSPHORUS: Phosphorus: 1.5 mg/dL — ABNORMAL LOW (ref 2.3–4.7)

## 2013-04-25 LAB — TROPONIN I
Troponin I: 0.02 ng/mL (ref 0.00–0.09)
Troponin I: 0.02 ng/mL (ref 0.00–0.09)

## 2013-04-25 LAB — GFR
EGFR: >60
EGFR: 56

## 2013-04-25 LAB — CK
Creatine Kinase (CK): 70 U/L (ref 29–168)
Creatine Kinase (CK): 62 U/L (ref 29–168)

## 2013-04-25 MED ORDER — RIVAROXABAN 20 MG PO TABS
20.0000 mg | ORAL_TABLET | Freq: Every day | ORAL | Status: DC
Start: 2013-04-25 — End: 2013-04-25

## 2013-04-25 MED ORDER — ATORVASTATIN CALCIUM 20 MG PO TABS
80.0000 mg | ORAL_TABLET | Freq: Every day | ORAL | Status: DC
Start: 2013-04-25 — End: 2013-04-26
  Administered 2013-04-25 – 2013-04-26 (×2): 80 mg via ORAL
  Filled 2013-04-25 (×3): qty 4

## 2013-04-25 MED ORDER — SODIUM BICARBONATE 650 MG PO TABS
650.0000 mg | ORAL_TABLET | Freq: Four times a day (QID) | ORAL | Status: DC
Start: 2013-04-25 — End: 2013-04-25
  Administered 2013-04-25 (×3): 650 mg via ORAL
  Filled 2013-04-25 (×3): qty 1

## 2013-04-25 MED ORDER — INSULIN GLARGINE 100 UNIT/ML SC SOLN
25.0000 [IU] | Freq: Once | SUBCUTANEOUS | Status: AC
Start: 2013-04-25 — End: 2013-04-25
  Administered 2013-04-25: 25 [IU] via SUBCUTANEOUS
  Filled 2013-04-25: qty 250

## 2013-04-25 MED ORDER — POTASSIUM CHLORIDE 20 MEQ/15ML (10%) PO LIQD UD CUP
40.0000 meq | Freq: Once | ORAL | Status: AC
Start: 2013-04-25 — End: 2013-04-25
  Administered 2013-04-25: 40 meq via ORAL
  Filled 2013-04-25: qty 30

## 2013-04-25 MED ORDER — INSULIN GLARGINE 100 UNIT/ML SC SOLN
20.0000 [IU] | Freq: Every evening | SUBCUTANEOUS | Status: DC
Start: 2013-04-26 — End: 2013-04-26

## 2013-04-25 MED ORDER — INSULIN ASPART 100 UNIT/ML SC SOLN
1.0000 [IU] | Freq: Three times a day (TID) | SUBCUTANEOUS | Status: DC
Start: 2013-04-25 — End: 2013-04-26
  Administered 2013-04-25: 3 [IU] via SUBCUTANEOUS
  Filled 2013-04-25: qty 30

## 2013-04-25 MED ORDER — GLUCOSE 40 % PO GEL
15.0000 g | ORAL | Status: DC | PRN
Start: 2013-04-25 — End: 2013-04-26

## 2013-04-25 MED ORDER — GLUCAGON HCL (RDNA) 1 MG IJ SOLR
1.0000 mg | INTRAMUSCULAR | Status: DC | PRN
Start: 2013-04-25 — End: 2013-04-26

## 2013-04-25 MED ORDER — TOPIRAMATE 100 MG PO TABS
100.0000 mg | ORAL_TABLET | Freq: Every day | ORAL | Status: DC
Start: 2013-04-25 — End: 2013-04-26
  Filled 2013-04-25: qty 1

## 2013-04-25 MED ORDER — INSULIN ASPART 100 UNIT/ML SC SOLN
5.0000 [IU] | Freq: Every day | SUBCUTANEOUS | Status: DC
Start: 2013-04-25 — End: 2013-04-26
  Administered 2013-04-25: 5 [IU] via SUBCUTANEOUS
  Filled 2013-04-25: qty 50

## 2013-04-25 MED ORDER — POTASSIUM CHLORIDE CRYS ER 20 MEQ PO TBCR
40.0000 meq | EXTENDED_RELEASE_TABLET | Freq: Once | ORAL | Status: AC
Start: 2013-04-25 — End: 2013-04-25
  Administered 2013-04-25: 40 meq via ORAL
  Filled 2013-04-25: qty 2

## 2013-04-25 MED ORDER — DEXTROSE 50 % IV SOLN
25.0000 mL | INTRAVENOUS | Status: DC | PRN
Start: 2013-04-25 — End: 2013-04-26

## 2013-04-25 MED ORDER — INSULIN ASPART 100 UNIT/ML SC SOLN
1.0000 [IU] | Freq: Every evening | SUBCUTANEOUS | Status: DC | PRN
Start: 2013-04-25 — End: 2013-04-26

## 2013-04-25 MED ORDER — MAGNESIUM SULFATE IN D5W 10-5 MG/ML-% IV SOLN
1.0000 g | Freq: Once | INTRAVENOUS | Status: AC
Start: 2013-04-25 — End: 2013-04-25
  Administered 2013-04-25: 1 g via INTRAVENOUS
  Filled 2013-04-25: qty 100

## 2013-04-25 MED ORDER — INSULIN ASPART 100 UNIT/ML SC SOLN
5.0000 [IU] | Freq: Every day | SUBCUTANEOUS | Status: DC
Start: 2013-04-26 — End: 2013-04-26
  Administered 2013-04-26: 5 [IU] via SUBCUTANEOUS
  Filled 2013-04-25: qty 50

## 2013-04-25 MED ORDER — RIVAROXABAN 20 MG PO TABS
20.0000 mg | ORAL_TABLET | Freq: Every day | ORAL | Status: DC
Start: 2013-04-25 — End: 2013-04-26
  Administered 2013-04-25: 20 mg via ORAL
  Filled 2013-04-25: qty 1

## 2013-04-25 MED ORDER — SODIUM CHLORIDE 0.9 % IV SOLN
20.0000 mmol | Freq: Once | INTRAVENOUS | Status: AC
Start: 2013-04-25 — End: 2013-04-25
  Administered 2013-04-25: 20 mmol via INTRAVENOUS
  Filled 2013-04-25: qty 6.67

## 2013-04-25 MED ORDER — INSULIN ASPART 100 UNIT/ML SC SOLN
3.0000 [IU] | Freq: Every morning | SUBCUTANEOUS | Status: DC
Start: 2013-04-26 — End: 2013-04-26
  Administered 2013-04-26: 3 [IU] via SUBCUTANEOUS
  Filled 2013-04-25: qty 30

## 2013-04-25 MED ORDER — MAGNESIUM SULFATE 1 G IN 100 ML IVPB
1.0000 g | Freq: Once | Status: DC
Start: 2013-04-25 — End: 2013-04-25

## 2013-04-25 NOTE — Progress Notes (Signed)
[facility]- Critical Care Note     ICU Daily Progress Note        Date Time: 04/25/2013 6:07 PM  Patient Name: Katherine [name]  Attending Physician: Lawernce Ion, MD  Room: IC03/IC03-[name]   Admit Date: 04/24/2013  LOS: 1 day            Assessment:     Patient Active Problem List   Diagnosis   . TIA (transient ischemic attack)   . Dysarthria   . History of DVT (deep vein thrombosis)   . PFO (patent foramen ovale)   . DM (diabetes mellitus)   . Hyperlipidemia   . Chronic migraine   . Dizziness   . UTI (lower urinary tract infection)   . Stroke-like symptoms   . DKA (diabetic ketoacidoses)   DKA resolved.  Hypokalemia, hypophosphatemia repleted.  Possible insulin pump malfunction.  Possible UTI.    Plan:   SQ insulin  Will give lantus as maintenance until insulin pump is checked.  Transfer med ward.  Check urine culture.      Subjective:   nad    Medications:       Scheduled Meds: PRN Meds:         insulin aspart 1-8 Units Subcutaneous TID AC   [START ON 04/26/2013] insulin aspart 3 Units Subcutaneous QAM W/BREAKFAST   [START ON 04/26/2013] insulin aspart 5 Units Subcutaneous Daily with lunch   insulin aspart 5 Units Subcutaneous Daily with dinner   [START ON 04/26/2013] insulin glargine 20 Units Subcutaneous QHS   [COMPLETED] insulin glargine 25 Units Subcutaneous Once   [COMPLETED] magnesium sulfate 1 g Intravenous Once   [COMPLETED] potassium chloride 40 mEq Oral Once   [COMPLETED] potassium chloride 40 mEq Oral Once   [COMPLETED] potassium phosphate IVPB 20 mmol Intravenous Once   pramipexole 0.25 mg Oral QHS   rivaroxaban 20 mg Oral Daily with dinner   sodium bicarbonate 650 mg Oral QID   [DISCONTINUED] magnesium sulfate 1 g Intravenous Once   [DISCONTINUED] rivaroxaban 20 mg Oral Daily with dinner       Continuous Infusions:       . [DISCONTINUED] insulin (regular) infusion 2 Units/hr (04/25/13 0706)         dextrose 15 g PRN   dextrose 25 mL PRN   glucagon (rDNA) 1 mg PRN   insulin aspart 1-6 Units QHS  and 0300 PRN   pneumococcal vaccine-23 0.5 mL Prior to discharge   pramipexole 0.25 mg QHS PRN   [DISCONTINUED] dextrose 25 mL PRN   [DISCONTINUED] dextrose 50 mL PRN   [DISCONTINUED] pramipexole 0.25 mg TID PRN             Physical Exam:     Filed Vitals:    04/25/13 1100 04/25/13 1200 04/25/13 1300 04/25/13 1630   BP: 97/38 84/36 89/48  105/45   Pulse: 75 74 81 77   Temp:  97.9 F (36.6 C)     TempSrc:  Temporal Artery     Resp: 13 11 15 17    Height:       Weight:       SpO2: 100% 98% 100% 100%     Temp (24hrs), Avg:97.5 F (36.4 C), Min:97 F (36.1 C), Max:98.1 F (36.7 C)           01/24 0701 - 01/25 0700  In: 2744.5 [P.O.:950; I.V.:1794.5]  Out: 300 [Urine:300]       General Appearance: nad   Mental status: alert  Neuro:  nonfocal  H & N: no lesions  Lungs: clear  Cardiac: reg Abdomen:  benign  Extremities: no cce  Skin: no rash      Data:         Labs:     Recent CBC   Lab 04/25/13 0329 04/24/13 1353   WBC 5.69 6.64   RBC 3.80* 4.65   HGB 11.0* 13.6   HCT 32.5* 40.0   MCV 85.5 86.0   PLT 180 229         Lab 04/25/13 1435 04/25/13 0904 04/25/13 0329 04/24/13 1353   NA 141 -- 142 139   K 3.3* -- 2.8* 3.8   CL 111* -- 114* 106   CO2 20* -- 18* 13*   GLU 245* -- 256* 439*   BUN 4.2* -- 5.0* 6.9*   CREAT 1.0 -- 0.9 1.4*   MG 1.9 -- 1.5* --   PHOS 1.5* -- -- --   AST -- -- -- 16   ALT -- -- -- 16   ALKPHOS -- -- -- 129   BILITOTAL -- -- -- 0.5   BILIDIRECT -- -- -- --   LIP -- -- -- 12   BNP -- -- -- 105.7*   PT -- -- -- 17.4*   INR -- -- -- 1.5   PTT -- -- -- 30   DDIMER -- -- -- --   CK -- 62 70 --   CKMB -- -- -- --   TROPI -- 0.02 0.02 --   MYOGLOBIN -- -- -- --           Rads:     Radiology Results (24 Hour)     ** No Results found for the last 24 hours. **            Signed by: Durward Fortes, MD  Date/Time: 04/25/2013 6:07 PM

## 2013-04-25 NOTE — Plan of Care (Signed)
Problem: Endocrine Abnormality  Goal: IHS BLOOD GLUCOSE STABLE AT ESTABLISHED GOAL (FENE)  Outcome: Progressing  Insulin gtt 2 mg/hr. Checking BG level q1h.  Intervention: Ensure appropriate diet; assess tolerance. (i.e. Nausea, Vomiting, Diarrhea)  Tolerating clear liquids. Denies N+V. 2 episodes of loose stool, large amounts both episodes.   Intervention: Assess for signs and symptoms of electrolyte imbalance  Am labs sent. Notified Dr, Laural Benes, eICU, K 2.7, mag 1.5.  Received orders for replacement.

## 2013-04-25 NOTE — Progress Notes (Signed)
Nursing Note:  @ 1638 BS- 212. Dr Lesle Reek to place new orders. Patient out of bed with one assist to commode. Tolerated activity without difficulty.  Instructed on obtaining urine sample for culture. Instruction understood. Urine sample obtained.  Stool for C-Diff obtained. Patient returned to bed.

## 2013-04-25 NOTE — Progress Notes (Signed)
Insulin drip stopped and pt placed on medium correction ss regular insulin.  Lantus also given prior to stopping drip.  Labs drawn at 2pm.  Potassium and phosphorus replaced as per orders by Dr. Lesle Reek.  Pt has orders to transfer out of the ICU.  Pt and daughter aware. Karna Christmas

## 2013-04-25 NOTE — H&P (Signed)
[facility]- Critical Care Note      ADMISSION- HISTORY AND PHYSICAL EXAM      Date Time: 04/25/2013 12:32 AM  Patient Name: Katherine [name],Katherine [name]  Attending Physician: Lawernce Ion, MD  Primary Care Physician: Veneda Melter, MD  Location/Room: IC03/IC03-[name]     Assessment:   Problem List:   Patient Active Problem List   Diagnosis   . TIA (transient ischemic attack)   . Dysarthria   . History of DVT (deep vein thrombosis)   . PFO (patent foramen ovale)   . DM (diabetes mellitus)   . Hyperlipidemia   . Chronic migraine   . Dizziness   . UTI (lower urinary tract infection)   . Stroke-like symptoms   . DKA (diabetic ketoacidoses)     DKA    Plan:     PLAN:    1.  Neurology:  History of stroke x2.  Had [name] TIA in the past in December of  2014.  Continue with Xarelto.  Per patient, she is currently being followed  by Dr. Alfredo Martinez.  2.  Cardiac:  Hemodynamically stable.  Chest pain free.  Would trend  cardiac enzymes given that DKA often can be caused by underlying myocardial  infarction.  No indication at this time, but we will closely monitor.  3.  Endocrine:  Insulin-dependent diabetes mellitus on insulin pump.  No  signs of active infection leading to DKA.  Possible DKA from malfunction of  current pump that has been replaced.  Continue with insulin drip at this  time.  Convert to insulin pump once patient is out of DKA and then continue  to closely monitor.  4.  Nutrition:  Allow consistent carbohydrate diet.    5.  Gastrointestinal:   Doing well, asymptomatic.  6.  Restless leg syndrome:  Continue with Mirapex.  7.  Mixed anion gap metabolic acidosis:  Will start sodium bicarbonate.      Code Status: full code  I have personally reviewed the patient's history and 24 hour interval events, along with vitals, labs, radiology images, and nurses report.     Chief Complaint / Primary Reason for ICU evaluation :      Chief Complaint   Patient presents with   . Emesis   . Hyperglycemia         DKA    History of  Presenting Illness:   Katherine [name] is [name] 63 y.o. 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  Patient had no recent URI symptoms. No chest pain. She states she is no prone to DKA.  Of note, she had an insulin pump malfunction 7 days PTA but had the company immediately send her another insulin pump the next day.  Yesterday, she noted increase nausea.      She states she feels much better. She has restless leg syndrome and requires mirapex for relief.      Of note, she recently moved from [place] after her stroke to be with her daughter in [place].  Her endocrinologist is still in [place]. She had not yet establish care at an endocrinologist in [place] yet.   Past Medical History:     Past Medical History   Diagnosis Date   . Cerebrovascular accident    . Migraine    . PFO (patent foramen ovale)    . Diabetes mellitus    . Hyperlipidemia    .  Seasonal allergies        Past Surgical History:     Past Surgical History   Procedure Date   . Cholecystectomy    . Hysterectomy    . Total abdominal hysterectomy w/ bilateral salpingoophorectomy    . Carpal tunnel release      L       Family History:     Family History   Problem Relation Age of Onset   . Diabetes Mother    . Cancer Mother    . COPD Father    . Cancer Sister    . Stroke Paternal Grandfather        Social History:     History     Social History   . Marital Status: Married     Spouse Name: N/[name]     Number of Children: N/[name]   . Years of Education: N/[name]     Social History Main Topics   . Smoking status: Passive Smoke Exposure - Never Smoker   . Smokeless tobacco: Never Used   . Alcohol Use: Yes      Comment: socially-- maybe once or twice [name] month   . Drug Use: No   . Sexually Active:      Other Topics Concern   . Not on file     Social History Narrative   . No narrative on file       Allergies:     Allergies   Allergen Reactions   . Penicillins Rash           Medications:      Prescriptions prior to admission   Medication Sig   . atorvastatin (LIPITOR) 80 MG tablet Take 80 mg by mouth daily.   . Insulin Infusion Pump Device by Does not apply route.   . pramipexole (MIRAPEX) 0.25 MG tablet Take 0.25 mg by mouth nightly.   . rivaroxaban (XARELTO) 20 MG Tab Take 20 mg by mouth daily with dinner.       Current Inpatient :  Current Facility-Administered Medications   Medication Dose Route Frequency   . [COMPLETED] insulin regular  5 Units Intravenous Once   . pramipexole  0.25 mg Oral QHS   . [COMPLETED] sodium chloride  1,000 mL Intravenous Once   . [COMPLETED] sodium chloride  1,000 mL Intravenous Once   . [DISCONTINUED] promethazine  12.5 mg Intravenous Once       Home Medications :     Prior to Admission medications    Medication Sig Start Date End Date Taking? Authorizing Provider   atorvastatin (LIPITOR) 80 MG tablet Take 80 mg by mouth daily.   Yes [provider]   Insulin Infusion Pump Device by Does not apply route.   Yes [provider]   pramipexole (MIRAPEX) 0.25 MG tablet Take 0.25 mg by mouth nightly.   Yes [provider]   rivaroxaban (XARELTO) 20 MG Tab Take 20 mg by mouth daily with dinner.   Yes [provider]            Review of Systems:   All other systems were reviewed and are negative except per hpi.     Physical Exam:     Filed Vitals:    04/24/13 2300   BP: 94/33   Pulse: 78   Temp:    Resp: 17   SpO2: 99%       Intake and Output Summary (Last 24 hours) at Date Time    Intake/Output Summary (  Last 24 hours) at 04/25/13 0032  Last data filed at 04/24/13 2000   Gross per 24 hour   Intake    650 ml   Output      0 ml   Net    650 ml       General Appearance:  alert, well appearing, and in no distress  Mental status:  alert, oriented to person, place, and time  Neuro: left arm contracted, 5/5 strength in other 3 extremities. Has normal speech pattern  Neck:supple, no significant adenopathy  Lungs: clear to auscultation, no wheezes,  rales or rhonchi, symmetric air entry  Cardiac: normal rate, regular rhythm, normal S1, S2, no murmurs, rubs, clicks or gallops  Abdomen:  soft, nontender, nondistended, no masses or organomegaly  Extremities: peripheral pulses normal, no pedal edema, no clubbing or cyanosis   Skin: no rash    Labs:          Labs (last 72 hours):  Recent Labs   Basename 04/24/13 1353    WBC 6.64    HGB 13.6    HCT 40.0    LABPLAT --     Recent Labs   [facility] 04/24/13 1353    PT 17.4*    INR 1.5    PTT 30    Recent Labs   Basename 04/24/13 1353    NA 139    K 3.8    CL 106    CO2 13*    BUN 6.9*    CREAT 1.4*    GLU 439*    CA 9.3    MG --    PHOS --                   Radiology / Imaging:     Imaging personally reviewed by me  including:       CXR  04/24/2013   no infiltrate        Signed by: Lawernce Ion  ZO:[id number], Harrel Lemon, MD

## 2013-04-25 NOTE — ED Provider Notes (Signed)
I have reviewed the notes, assessments, and/or procedures performed by the PA, I concur with her/his documentation of Katherine [name].  Patient seen and examined by myself.  Agree w/ disposition and plan.      Herma Ard, MD  04/25/13 [phone]

## 2013-04-25 NOTE — Progress Notes (Signed)
Pain Management Plan    Education about your Pain Management.    Dear Katherine [name],    It is my pleasure to care for you during your hospitalization here at [facility]. We have initiated a new program to educate our patients and/or your family members about your pain management plan.    At [facility], we are committed to providing patients the best patient care possible.  We are seeking every opportunity to meet your unique needs.     Pain is the most common medical problem that requires urgent attention.We understand how difficult it is to have pain and will work with you and your physicians to develop an effective pain management solution.    We will administer medications to control your pain as prescribed by your provider.  We will offer to provide other comfort measures as needed. We will follow up frequently to assess your pain and the effectiveness of your  pain management plan. We will communicate with your Physicians frequently to change the medications if needed.    Thank you for your time.    Melrose Nakayama, RN  04/25/2013  10:36 PM  [facility]  [address]  [place], [place]  [address]

## 2013-04-26 DIAGNOSIS — E1110 Type 2 diabetes mellitus with ketoacidosis without coma: Secondary | ICD-10-CM

## 2013-04-26 DIAGNOSIS — E1010 Type 1 diabetes mellitus with ketoacidosis without coma: Secondary | ICD-10-CM

## 2013-04-26 DIAGNOSIS — E785 Hyperlipidemia, unspecified: Secondary | ICD-10-CM

## 2013-04-26 DIAGNOSIS — E119 Type 2 diabetes mellitus without complications: Secondary | ICD-10-CM

## 2013-04-26 DIAGNOSIS — A0472 Enterocolitis due to Clostridium difficile, not specified as recurrent: Secondary | ICD-10-CM | POA: Diagnosis not present

## 2013-04-26 LAB — BASIC METABOLIC PANEL
Anion Gap: 10 (ref 5.0–15.0)
BUN: 3.2 mg/dL — ABNORMAL LOW (ref 7.0–19.0)
CO2: 20 mEq/L — ABNORMAL LOW (ref 22–29)
Calcium: 8.4 mg/dL — ABNORMAL LOW (ref 8.5–10.5)
Chloride: 111 mEq/L — ABNORMAL HIGH (ref 98–107)
Creatinine: 0.8 mg/dL (ref 0.6–1.0)
Glucose: 190 mg/dL — ABNORMAL HIGH (ref 70–100)
Potassium: 3.2 mEq/L — ABNORMAL LOW (ref 3.5–5.1)
Sodium: 141 mEq/L (ref 136–145)

## 2013-04-26 LAB — CBC
Hematocrit: 34.4 % — ABNORMAL LOW (ref 37.0–47.0)
Hgb: 11.6 g/dL — ABNORMAL LOW (ref 12.0–16.0)
MCH: 28.9 pg (ref 28.0–32.0)
MCHC: 33.7 g/dL (ref 32.0–36.0)
MCV: 85.8 fL (ref 80.0–100.0)
MPV: 11.2 fL (ref 9.4–12.3)
Platelets: 168 10*3/uL (ref 140–400)
RBC: 4.01 10*6/uL — ABNORMAL LOW (ref 4.20–5.40)
RDW: 17 % — ABNORMAL HIGH (ref 12–15)
WBC: 4.24 10*3/uL (ref 3.50–10.80)

## 2013-04-26 LAB — GLUCOSE WHOLE BLOOD - POCT
Whole Blood Glucose POCT: 191 mg/dL — ABNORMAL HIGH (ref 70–100)
Whole Blood Glucose POCT: 225 mg/dL — ABNORMAL HIGH (ref 70–100)

## 2013-04-26 LAB — PHOSPHORUS: Phosphorus: 2.8 mg/dL (ref 2.3–4.7)

## 2013-04-26 LAB — MAGNESIUM: Magnesium: 1.8 mg/dL (ref 1.6–2.6)

## 2013-04-26 LAB — GFR: EGFR: >60

## 2013-04-26 MED ORDER — INSULIN ASPART 100 UNIT/ML SC SOLN
SUBCUTANEOUS | Status: DC
Start: 2013-04-26 — End: 2014-03-17

## 2013-04-26 MED ORDER — INSULIN ASPART 100 UNIT/ML SC SOLN
1.0000 [IU] | Freq: Three times a day (TID) | SUBCUTANEOUS | Status: DC
Start: 2013-04-26 — End: 2013-04-26
  Administered 2013-04-26: 2 [IU] via SUBCUTANEOUS
  Administered 2013-04-26: 1 [IU] via SUBCUTANEOUS
  Filled 2013-04-26: qty 20
  Filled 2013-04-26: qty 10

## 2013-04-26 MED ORDER — GLUCAGON HCL (RDNA) 1 MG IJ SOLR
1.0000 mg | INTRAMUSCULAR | Status: DC | PRN
Start: 2013-04-26 — End: 2013-04-26

## 2013-04-26 MED ORDER — INSULIN SYRINGE 29G X 1/2" 1 ML MISC
Status: AC
Start: 2013-04-26 — End: ?

## 2013-04-26 MED ORDER — DEXTROSE 50 % IV SOLN
25.0000 mL | INTRAVENOUS | Status: DC | PRN
Start: 2013-04-26 — End: 2013-04-26

## 2013-04-26 MED ORDER — INSULIN GLARGINE 100 UNIT/ML SC SOLN
20.0000 [IU] | Freq: Every evening | SUBCUTANEOUS | Status: DC
Start: 2013-04-26 — End: 2014-03-17

## 2013-04-26 MED ORDER — POTASSIUM CHLORIDE CRYS ER 20 MEQ PO TBCR
40.0000 meq | EXTENDED_RELEASE_TABLET | Freq: Once | ORAL | Status: AC
Start: 2013-04-26 — End: 2013-04-26
  Administered 2013-04-26: 40 meq via ORAL
  Filled 2013-04-26: qty 2

## 2013-04-26 MED ORDER — METRONIDAZOLE 500 MG PO TABS
500.0000 mg | ORAL_TABLET | Freq: Three times a day (TID) | ORAL | Status: AC
Start: 2013-04-26 — End: 2013-05-06

## 2013-04-26 MED ORDER — GLUCOSE 40 % PO GEL
15.0000 g | ORAL | Status: DC | PRN
Start: 2013-04-26 — End: 2013-04-26

## 2013-04-26 NOTE — Discharge Summary (Signed)
[facility] Hospitalist Discharge Note      Date Time: 04/26/2013  10:06 AM  Patient Name:Katherine [name]  ZOX:[id number]  PCP: Veneda Melter, MD  Attending Physician:Jhania Etherington [facility] M.D.    Date of Admission:   04/24/2013    Date of Discharge:   04/26/2013    Chief Complaint:      Chief Complaint   Patient presents with   . Emesis   . Hyperglycemia       Reason for Admission:   DKA (diabetic ketoacidoses) [250.10]  Vomiting [787.03]  DM (diabetes mellitus) [250.00]  Renal insufficiency [593.9]  DKA (diabetic ketoacidoses)  DKA (diabetic ketoacidoses)    Discharge Diagnosis:     Lists the present on admission hospital problems  Present on Admission:   . DKA (diabetic ketoacidoses)  . Hyperlipidemia  . DM (diabetes mellitus)    Hospital Problems:  Principal Problem:   *DKA (diabetic ketoacidoses)  Active Problems:   DM (diabetes mellitus)   Hyperlipidemia   C. difficile colitis      Consultants:   Critical Care ([place])    Procedures performed:   See radiologic studies below.    Hospital Course:   Please see H&P for complete details of HPI and ROS. The patient was admitted to [facility] and has been diagnosed with the following conditions and has been taken care as mentioned below.    1.  Diabetic ketoacidosis.  This is likely secondary to acute infection as  well as her insulin pump malfunctioning.  She was initially admitted to the  ICU and placed on insulin drip.  She did well from that perspective, and  her gap closed.  She was placed on fast-acting insulin as well as  long-acting insulin.  Her glucoses were under control at discharge.  She  will continue this regimen until she follows up with an endocrinologist,  which I gave a name for and transition back to her insulin pump.       2.  Hypokalemia. Given a dose of potassium chloride at discharge and can  follow up with a primary care provider for followup BMP in 1 week.      3.  Clostridium difficile colitis.  The patient diagnosed with C.  difficile  colitis in the hospital.  She was given a 10-day prescription for  metronidazole.       4.  Hypovolemic shock.  The patient's blood pressure initially was 72/49.   She recovered well with fluids.     5.  Hyperlipidemia.  The patient was continued on her atorvastatin.     I talked with the daughter prior to discharge and they both verbalized  understanding.  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    Physical Exam:    height is 1.702 m (5\' 7" ) and weight is 103.6 kg (228 lb 6.3 oz). Her temporal artery temperature is 97.2 F (36.2 C). Her blood pressure is 104/54 and her pulse is 77. Her respiration is 18 and oxygen saturation is 97%.   Body mass index is 35.76 kg/(m^2).  Filed Vitals:    04/25/13 2215 04/26/13 0109 04/26/13 0448 04/26/13 0925   BP: 135/58 111/55 98/42 104/54   Pulse: 81 73 75 77   Temp: 97.3 F (36.3 C) 96.8 F (36 C) 97.3 F (36.3 C) 97.2 F (36.2 C)   TempSrc:    Temporal Artery   Resp: 18 18 18 18    Height:  Weight:       SpO2: 98% 96% 97% 97%     Intake and Output Summary (Last 24 hours) at Date Time    Intake/Output Summary (Last 24 hours) at 04/26/13 1006  Last data filed at 04/25/13 1800   Gross per 24 hour   Intake  896.3 ml   Output    400 ml   Net  496.3 ml       Gen : NAD, cooperative  Heart: RRR, no m/r/g  Lungs: CTAB  Ext: WWP, no c/c/e  Abd: bs+, NT/ND      Labs:     Results     Procedure Component Value Units Date/Time    Glucose Whole Blood - POCT [[id number]]  (Abnormal) Collected:04/26/13 0741     POCT - Glucose Whole blood 191 (H) mg/dL EPPIRJJ:88/41/66 0630    Clostridium difficile toxin [[id number]] Collected:04/25/13 1250    Specimen Information:Stool / Stool Updated:04/26/13 0556    Narrative:    Positive Results called to [id number].  Readback confirmed, by [id number] on  04/26/2013 at 05:56  ORDER#: [id number]                                    ORDERED BY: MENDIGUREN, IGN  SOURCE: Stool                                         COLLECTED:  04/25/13 12:50  ANTIBIOTICS AT COLL.:                                RECEIVED :  04/25/13 20:30  Positive Results called to [id number].  Readback confirmed, by [id number] on 04/26/2013 at 05:56  Clostridium difficile toxin B PCR          FINAL       04/26/13 05:56   +  04/26/13   Positive for Clostridium difficile Toxin B gene             Test performed using a BDMax PCR assay.             Due to the high sensitivity of the assay, duplicate testing             is not recommended. Testing will be restricted to one             specimen per 7 days (contact the laboratory to request             additional testing on a case by case basis).             Molecular detection of the C. difficile toxin B gene is not             recommended as a test of cure or for surveillance purposes.             Testing will not be performed on formed stool.      Basic Metabolic Panel [[id number]]  (Abnormal) Collected:04/26/13 0409    Specimen Information:Blood Updated:04/26/13 0456     Glucose 190 (H) mg/dL      BUN 3.2 (L) mg/dL      Creatinine 0.8 mg/dL      CALCIUM 8.4 (L) mg/dL      Sodium 371 mEq/L  Potassium 3.2 (L) mEq/L      Chloride 111 (H) mEq/L      CO2 20 (L) mEq/L      Anion Gap 10.0     Magnesium [[id number]] Collected:04/26/13 0409    Specimen Information:Blood Updated:04/26/13 0456     Magnesium 1.8 mg/dL     Phosphorus [[id number]] Collected:04/26/13 0409    Specimen Information:Blood Updated:04/26/13 0456     Phosphorus 2.8 mg/dL     GFR [[id number]] YQMVHQION:62/95/28 0409     EGFR >60.0 Updated:04/26/13 0456    CBC without differential [[id number]]  (Abnormal) Collected:04/26/13 0409    Specimen Information:Blood / Blood Updated:04/26/13 0437     WBC 4.24 x10 3/uL      RBC 4.01 (L) x10 6/uL      Hgb 11.6 (L) g/dL      Hematocrit 27.2 (L) %      MCV 85.8 fL      MCH 28.9 pg      MCHC 33.7 g/dL      RDW 17 (H) %      Platelets 168 x10 3/uL      MPV 11.2 fL     Glucose Whole Blood - POCT [[id number]]  (Abnormal)  Collected:04/25/13 2210     POCT - Glucose Whole blood 198 (H) mg/dL VQQVZDG:38/75/64 3329    MRSA culture [[id number]] Collected:04/24/13 1750    Specimen Information:Body Fluid / Nasal Swab-ASC Admission Updated:04/25/13 2202    Narrative:    ORDER#: [id number]                                    ORDERED BY: LEE, CINDY  SOURCE: Nasal Swab-ASC Admission                     COLLECTED:  04/24/13 17:50  ANTIBIOTICS AT COLL.:                                RECEIVED :  04/24/13 23:25  Culture MRSA Surveillance                  FINAL       04/25/13 22:02  04/25/13   No Methicillin resistant Staph aureus isolated.      MRSA culture [[id number]] Collected:04/24/13 1750    Specimen Information:Body Fluid / Throat - ASC Admission Updated:04/25/13 2202    Narrative:    ORDER#: [id number]                                    ORDERED BY: LEE, CINDY  SOURCE: Throat - ASC Admission                       COLLECTED:  04/24/13 17:50  ANTIBIOTICS AT COLL.:                                RECEIVED :  04/24/13 23:25  Culture MRSA Surveillance                  FINAL       04/25/13 22:02  04/25/13   No Methicillin resistant Staph aureus isolated.      Urine culture [[id number]] Collected:04/25/13 1618    Specimen Information:Urine /  Urine, Clean Catch Updated:04/25/13 2028    Glucose Whole Blood - POCT [[id number]]  (Abnormal) Collected:04/25/13 1638     POCT - Glucose Whole blood 212 (H) mg/dL LKGMWNU:27/25/36 6440    Basic Metabolic Panel [[id number]]  (Abnormal) Collected:04/25/13 1435    Specimen Information:Blood Updated:04/25/13 1517     Glucose 245 (H) mg/dL      BUN 4.2 (L) mg/dL      Creatinine 1.0 mg/dL      CALCIUM 8.6 mg/dL      Sodium 387 mEq/L      Potassium 3.3 (L) mEq/L      Chloride 111 (H) mEq/L      CO2 20 (L) mEq/L      Anion Gap 10.0     GFR [[id number]] Collected:04/25/13 1435     EGFR 56.0 Updated:04/25/13 1517    Magnesium [[id number]] Collected:04/25/13 1435    Specimen Information:Blood Updated:04/25/13 1517     Magnesium  1.9 mg/dL     Phosphorus [[id number]]  (Abnormal) Collected:04/25/13 1435    Specimen Information:Blood Updated:04/25/13 1517     Phosphorus 1.5 (L) mg/dL     Glucose Whole Blood - POCT [[id number]]  (Abnormal) Collected:04/25/13 1410     POCT - Glucose Whole blood 228 (H) mg/dL RKYHCWC:37/62/83 1517    Glucose Whole Blood - POCT [[id number]]  (Abnormal) Collected:04/25/13 1157     POCT - Glucose Whole blood 233 (H) mg/dL GYIRSWN:46/27/03 5009    Glucose Whole Blood - POCT [[id number]]  (Abnormal) Collected:04/25/13 1058     POCT - Glucose Whole blood 240 (H) mg/dL JIRCVEL:38/10/17 5102    Glucose Whole Blood - POCT [[id number]]  (Abnormal) Collected:04/25/13 1004     POCT - Glucose Whole blood 269 (H) mg/dL MPNTIRW:43/15/40 0867    Troponin I [[id number]] Collected:04/25/13 0904    Specimen Information:Blood Updated:04/25/13 1016     Troponin I 0.02 ng/mL     Narrative:    Total 3 sets    Creatine Kinase (CK) [[id number]] Collected:04/25/13 0904    Specimen Information:Blood Updated:04/25/13 1009     Creatine Kinase (CK) 62 U/L     Narrative:    Total 3 sets        Labs reviewed.    Rads:   Radiological Procedure reviewed.    Discharge Medications:      Ronisha, [place]   Home Medication Instructions [id number]    Printed on:04/26/13 1006   Medication Information                      pramipexole (MIRAPEX) 0.25 MG tablet  Take 0.25 mg by mouth nightly.             rivaroxaban (XARELTO) 20 MG Tab  Take 20 mg by mouth daily with dinner.             atorvastatin (LIPITOR) 80 MG tablet  Take 80 mg by mouth daily.             insulin aspart (NOVOLOG) 100 UNIT/ML injection  3 units before breakfast, and 5 units before lunch and dinner             insulin glargine (LANTUS) 100 UNIT/ML injection  Inject 20 Units into the skin nightly.             INSULIN SYRINGE 1CC/29G 29G X 1/2" 1 ML Misc  Use as directed                 Discharge Destination:   Home  Condition at Discharge :   Improved    Time spent for Discharge Care:    35 minutes     Labs to Monitor:   Bmp IN 1 WEEK.    Follow-up with PCP and Consultants:   One week with PCP Lelon Perla).  Set up new appt with Endocrinologist.    All abnormal labs will be followed by the PCP.    Patient was instructed to come to the ER if any of their symptoms worsen.    Cora Collum, MD  [facility] - Hospitalist  [facility]  [address]  [place], [place] [address]  [phone]    04/26/2013 10:06 AM

## 2013-04-26 NOTE — Plan of Care (Signed)
Problem: Safety  Goal: Patient will be free from injury during hospitalization  Intervention: Hourly rounding.  .

## 2013-04-26 NOTE — Progress Notes (Signed)
Discharge instructions given to patient's daughter-Stephanie, med list given ,what it is for and its side effect, verbalized understanding.

## 2013-04-26 NOTE — Progress Notes (Signed)
Result Narrative       Positive Results called to [id number]. Readback confirmed, by [id number] on  04/26/2013 at 05:56  ORDER#: [id number] ORDERED BY: MENDIGUREN, IGN  SOURCE: Stool COLLECTED: 04/25/13 12:50  ANTIBIOTICS AT COLL.: RECEIVED : 04/25/13 20:30  Positive Results called to [id number]. Readback confirmed, by [id number] on 04/26/2013 at 05:56  Clostridium difficile toxin B PCR FINAL 04/26/13 05:56 +  04/26/13 Positive for Clostridium difficile Toxin B gene  Test performed using a BDMax PCR assay.  Due to the high sensitivity of the assay, duplicate testing  is not recommended. Testing will be restricted to one  specimen per 7 days (contact the laboratory to request  additional testing on a case by case basis).  Molecular detection of the C. difficile toxin B gene is not  recommended as a test of cure or for surveillance purposes.  Testing will not be performed on formed stool.

## 2013-04-26 NOTE — Discharge Instructions (Signed)
Planning for Travel When You Have Diabetes  Taking care of your diabetes means developing a routine for things like meals, exercising, and taking medication. But sometimes this routine is disrupted when you travel. Your healthcare team can help you work out a plan to prepare for unexpected situations. The tips below can help.    When You Travel  During your trip, stick to your meal and exercise plans as much as you can.   Wear an ID necklace or bracelet that says you have diabetes.   Keep your diabetes kit with you, not in your luggage.   Pack double the supplies you think you will need. Try not to put them all in the same bag.   On train, bus, or airplane trips, take a walk in the aisle at least every 2 hours.   Always carry a source of fast-acting sugar with you, such as glucose tablets or hard candies.   Carry extra snacks, such as crackers, cheese, or fruit, in case meals are delayed.   Drink plenty of water, especially when traveling by air.   If you're traveling across more than two time zones, ask your healthcare provider how to adjust your medication or insulin schedule.    Be Prepared   Keep a diabetes kit. It should include your blood glucose meter, batteries, test strips, lancing device, fast-acting sugar, extra medication, syringes if needed, and copies of prescriptions. Use a case designed to carry diabetes supplies. Or use a makeup case, a belt pouch, or briefcase.   Take your diabetes kit with you everywhere, just like you take your wallet and keys.   Wear a bracelet or necklace that says you have diabetes.   Store supplies at work as well as at home.   Carry your healthcare provider's phone number with you.   [address], [address], [place], [place] [address]. All rights reserved. This information is not intended as a substitute for professional medical care. Always follow your healthcare professional's instructions.      Clostridium difficile Infection  Clostridium  difficile(or C difficile) bacteria are harmful germs. They infect the intestinal tract. They can cause symptoms ranging from mild diarrhea to bad inflammation of the colon (large intestine). C difficile infection most often occurs during or after treatment with antibiotics. Anyone can become infected. But the risk is greatest for people in hospitals and nursing homes. This is because antibiotic use is common there. Germs also spread easily in these places. This sheet tells you more about preventing the spread of this infection.    What CausesC difficile Infection?  The digestive tract (gut) has hundreds of kinds of bacteria. Small amounts of C difficile are normal. Many of these bacteria are "good." They help keep harmful bacteria likeC difficile in check. When a person takes antibiotics, many bacteria in the gut die off. This may leave too few "good" bacteria. Harmful bacteria like C difficile may grow out of control. In hospitals and nursing homes, C difficile can travel from an infected patient to a non-infected patient. It travels on the hands of caregivers and visitors. It also can be on objects such as bed rails, stethoscopes, and bedpans.  What Are the Symptoms of C difficile Infection?  People with C difficile infection often have no symptoms. Yet they can still pass the infection. Others do have symptoms. These include have watery diarrhea, abdominal pain, and cramping. But they don't get very ill. Some who are infected develop serious problems. They may  have severe diarrhea, fever, and blood or pus in the stool. If symptoms appear, they often begin a few days to a week or more after antibiotics are started. But they may also appear weeks or even months later. If you have been given antibiotics and develop symptoms like those above, call the doctor right away.  How IsC difficile Infection Diagnosed?  To confirm the infection, a sample of stool is tested for toxins made by the bacteria. It is often  diagnosed in the hospital. But symptoms can start once a patient is at home.  How IsC difficile Infection Treated?   The first step is to stop taking antibiotics. If they can't be stopped, a different medication may be tried. In certain cases, an antibiotic directed at the C difficileinfection may be given.   Fluids are often given through an IV placed in the arm. This helps replace fluids lost through diarrhea.   Probiotics (supplements of healthy bacteria) may be given. They help restore a good balance in the intestine.   Surgery may be needed if treatment fails to cure severe symptoms.  Easing Symptoms of C difficile Infection   Drink plenty of fluids to replace water lost through diarrhea. Plain water or clear soups are best. Avoid carbonated drinks and alcohol. They can make symptoms worse. So can coffee, tea, milk, fruit juice, and colas.   Follow your provider's instructions for when and what to eat.   Until the diarrhea clears up, avoid fruit. Also avoid dairy foods except yogurt. They can make diarrhea worse.   Unless your provider tells you to do so, do not take medications for diarrhea.   Tell your provider if symptoms return. Even after treatment, C difficile may come back.  PreventingC difficile Infection: What Hospitals and Nursing Homes Are Doing  Many hospitals and nursing homes take these steps to help prevent C difficile infections:   Limiting use of antibiotics.Giving antibiotics only when needed can help reduce C difficile infections.   Handwashing.Hospital staff wash their hands before and after treating each patient with C difficile infection. They also wash their hands after touching any surface that may be affected. Soap and water work better than alcohol-based hand cleaners.   Protective clothing.Healthcare workers wear gloves and a gown when entering the room of a patient with C difficile infection. They remove these items before leaving.   Private rooms.People with C  difficile are placed in private rooms. Or, they may share a room only with others who have the same infection.   Thorough cleaning.Equipment and rooms are cleaned and disinfected daily.   Education.Patients and visitors are shown the best ways to avoid infection.  PreventingC difficile Infection: What You Can Do   Take antibiotics only when you really need them. Antibiotics don't help treat illnesses caused by viruses. This includes colds and the flu. Don't ask for antibiotics from your doctor if he or she says they won't work.   When you are given antibiotics, take them as directed. Don't increase or decrease the dosage. Do not take them for shorter or longer than your doctor tells you to, even if you feel better.   You can try probiotic supplements or yogurt with healthy bacteria. Do this during and after antibiotic treatment. You can buy probiotic supplements at most natural foods stores and pharmacies.   Wash your hands carefully. Do this after using the bathroom and before eating. Use plenty of soap and warm water. Alcohol-based hand gels may not work against  C difficile germs.  How Family and Friends Can Help  In a hospital or care facility:   Wash your hands well before and after visiting someone who has C difficile infection. Use soap and water. Alcohol-based hand gels may not work against C difficile germs.   If the staff asks you to, wear gloves. Take any other steps you are asked to follow to help prevent infection.  At home:   Wear gloves when caring for a family member with C difficile infection. Throw the gloves away after each use. Then wash your hands well.   Wash the patient's clothes, bed linen, and towels separately. Use hot water. Use both detergent and liquid bleach.   Disinfect surfaces in the patient's room. This includes the phone, light switches, and remote controls.  Tips for Good Handwashing:   Use warm water and plenty of soap. Work up a Systems developer.   Clean the whole  hand: Under nails, between fingers, and up the wrists.   Wash for at least 15seconds. Don't just wipe. Scrub well.   Rinse. Let the water run down your fingers, not up your wrists.   Dry your hands well. Then use a paper towel to turn off the faucet and open the door.   [address] [place], [place] [address]. All rights reserved. This information is not intended as a substitute for professional medical care. Always follow your healthcare professional's instructions.

## 2013-04-26 NOTE — Plan of Care (Signed)
Ask3Teach3 Program    Education about New Medications and their Side effects    Dear Katherine [name],    Its been a pleasure taking care of you during your hospitalization here at [facility]. We have initiated a new program to educate our patients and/or their family members or designated personnel about the new medications started by your physicians and their indications along with the possible side effects. Multiple studies have shown that patients started on new medications are often unaware of the names of the medication along with the indications and their side effects which leads to decreased compliance with the medications.    During our conversation today on 04/26/2013  12:03 PM I have explained to you the name of the new medication and the indication along with some possible common side effects. Listed below are some of the new medications started during this hospitalization.     Please call the Nurse if you have any side effects while in hospital.     Please call 911 if you have any life threatening symptoms after you are discharged from the hospital.    Please inform your Primary care physician for common side effects which are not life threatening after discharge.  Medication: Metronidazole(Flagyl)   This Medication is used for:   Bacterial Infections    Common Side Effects are:   Metallic taste   Abdominal pain   Yeast infection    A note from your nurse:  Call your nurse immediately if you notice itching, hives, swelling or trouble breathing           Thank you for your time.    Susanne Borders, RN  04/26/2013  12:03 PM  [facility]  [address]  [place], [place]  [address]

## 2013-04-26 NOTE — Progress Notes (Signed)
Discharged to home accompanied by staff, family and belongings with the patient.

## 2013-04-26 NOTE — Plan of Care (Signed)
Ask3Teach3 Program    Education about New Medications and their Side effects    Dear Philippa Sicks,    Its been a pleasure taking care of you during your hospitalization here at [facility]. We have initiated a new program to educate our patients and/or their family members or designated personnel about the new medications started by your physicians and their indications along with the possible side effects. Multiple studies have shown that patients started on new medications are often unaware of the names of the medication along with the indications and their side effects which leads to decreased compliance with the medications.    During our conversation today on 04/26/2013  12:04 PM I have explained to you the name of the new medication and the indication along with some possible common side effects. Listed below are some of the new medications started during this hospitalization.     Please call the Nurse if you have any side effects while in hospital.     Please call 911 if you have any life threatening symptoms after you are discharged from the hospital.    Please inform your Primary care physician for common side effects which are not life threatening after discharge.    Medication: Insulin Glargine(Lantus)   This Medication is used for:   Diabetes   High Potassium Levels    Common Side Effects are:   Low Blood sugars(Weakness, Fatigue and rapid heart rate)    A note from your nurse:  Call your nurse immediately if you notice itching, hives, swelling or trouble breathing         Thank you for your time.    Susanne Borders, RN  04/26/2013  12:04 PM  [facility]  [address]  [place], [place]  [address]

## 2013-04-30 ENCOUNTER — Encounter (INDEPENDENT_AMBULATORY_CARE_PROVIDER_SITE_OTHER): Payer: Self-pay

## 2013-04-30 DIAGNOSIS — Z0289 Encounter for other administrative examinations: Secondary | ICD-10-CM

## 2013-05-10 ENCOUNTER — Ambulatory Visit: Payer: Commercial Managed Care - POS

## 2013-05-10 DIAGNOSIS — I69998 Other sequelae following unspecified cerebrovascular disease: Secondary | ICD-10-CM | POA: Insufficient documentation

## 2013-05-10 DIAGNOSIS — I6350 Cerebral infarction due to unspecified occlusion or stenosis of unspecified cerebral artery: Secondary | ICD-10-CM

## 2013-05-10 DIAGNOSIS — I6990 Unspecified sequelae of unspecified cerebrovascular disease: Secondary | ICD-10-CM

## 2013-05-10 DIAGNOSIS — R29898 Other symptoms and signs involving the musculoskeletal system: Secondary | ICD-10-CM | POA: Insufficient documentation

## 2013-05-10 NOTE — PT Eval Note (Signed)
[facility]  [address], [address]  [place], [place]  [address]  Phone:  [phone]  Fax:  [phone]    PHYSICAL THERAPY EVALUATION      Referred By: Veneda Melter, MD    *I agree to the plan of care stated below*                                                                                                                                           Physician Signature      Date      PATIENT: Katherine [name] DOB: 1950/05/28   MR #: [id number]  AGE: 63 y.o.    FACILITY PROVIDER #: [id number] PRIMARY MD: Veneda Melter, MD    HICN# Patient is not covered by Medicare. DIAGNOSES: CVA (cerebral infarction) [434.91]    CERTIFICATION PERIOD: 05/10/2013 to 08/05/2013       Date of Service PT Received On: 05/10/13   Treatment Time Start Time: 1500 to Stop Time: 1615   Time Calculation Time Calculation (min): 75 min   Visit # PT Visit  PT Visit Number: 1   Units Billed PT Evaluation  $ PT Evaluation (97001): 1 Procedure       Past Medical/Surgical History:  Past Medical History   Diagnosis Date   . Cerebrovascular accident    . Migraine    . PFO (patent foramen ovale)    . Diabetes mellitus    . Hyperlipidemia    . Seasonal allergies      Past Surgical History   Procedure Date   . Cholecystectomy    . Hysterectomy    . Total abdominal hysterectomy w/ bilateral salpingoophorectomy    . Carpal tunnel release      L         Medications:  has a current medication list which includes the following prescription(s): atorvastatin, insulin aspart, insulin glargine, insulin syringe 1cc/29g, pramipexole, and rivaroxaban.    Precautions:   falls risk    History of Present Illness: Katherine [name] is a 63 y.o. female who had a R CVA on 02/24/2013 and subsequent weakness in L LE and UE.  Pr reports greatest difficulty in dressing, and she notes incr time in making meals and doing laundry.    Home Environment:    Living Arrangements: Family members (daughter, son-in,  Social worker and grandson)  Type of Home:  Multi-level House  DME Currently at Home: Wheelchair-manual;Quad cane (bedrails on bed, elevated toilet seat, bilat rails on stairs)    Prior level of function:  Independent with ADLs;Ambulates independently; Driving and working as Warden/ranger  Baseline Activity Level: Household ambulation (with and without SPC)  Driving:  (currently not driving)  Cooking: light meal prep  Employment: FT Surveyor, mining)    Patient Goal:  To be able to use L arm and  hand and to be self sufficient.    Pain:    No/denies pain    SUBJECTIVE REPORT:    Pt feeling good - she just would like to improve function and safety so she can return to independent living and return to work.     OBJECTIVE FINDINGS:  Gross Range of Motion:   Right Upper Extremity ROM: within functional limits  Left Upper Extremity ROM:  limited L shoulder motion - shoulder flexion and abd <90deg.   Right Lower Extremity ROM: within functional limits  Left Lower Extremity ROM: within functional limits    Gross Strength:    Gross Strength  Right Lower Extremity Strength: 4+/5  Left Lower Extremity Strength: 3/5    Sensation:   Intact to light touch    Tone:    Tone  Tone:  (incr tone L UE)    Observation:   +1 pitting edema in L LE and UE    Functional Mobility:    Functional Mobility  Sit to Stand: Independent  Stand to Sit: Independent    Transfers:   Transfers  Bed to Chair: Independent  Chair to Bed: Independent    Balance:   Sitting - Dynamic: Good  Standing - Static: Fair  Standing - Dynamic: Poor  TUG:  Score 15.18sec - without device  Berg:  Score 43/56    Locomotion:   Ambulation: modified independent (device);with quad cane  Ambulation Distance (Feet): 120 Feet  Pattern: decreased step length;Step to;decreased cadence;3 point  Stair Management: stand by assistance;two rails  Number of Stairs: 16     Outcomes:   TUG:  Score 15.18sec - RISK FOR FALLS Yes  Berg:  Score 43/56 - RISK FOR FALLS No    Patient  Education:   Patient was educated on goals and benefits of therapy, as well as PT role and POC.   Pt in agreement with plan.    Pt. did demonstrate an understanding of this education.  Home Exercise Program was not issued today.      ASSESSMENT:   Katherine [name] is a 63 y.o. female presenting to outpatient therapy with decreased motor control, decreased static/dynamic balance, and increased tone resulting in decreased safety and independence with functional mobility.  Patient requires skilled Physical Therapy intervention to address the above deficits to maximize safety with mobility and return to prior level of function.    Functional Limitations include:  Patient is unable to ambulate safely for community distances for safety with attendance of medical appointments and performance of IADL's.  Patient is unable to negotiate stairs / curbs for safetey with household and/or community mobility.  Patient is unable to perform work duties due to the following musculoskeletal restrictions:  Patient is at risk for falls.    Disabilities:  Without intervention, patient may not be able to:  Participate in safe and independent community ambulation for IADL's such as grocery shopping and preparing meals.  Participate in a full work day safely and independently.    Patient will require skilled intervention to address above stated functional limitations and prevent resultant disability.    GOALS:  G Codes:  Based on Berg Balance Scale  Current score: 43/56 (CJ)  Goal: (CI)    Short Term Goals:  3 weeks  1) Independent with initial home exercise program.  2) Decrease Timed Get up and Go to 12 seconds to decrease risk of falls.  3) Ambulate indoor surfaces independently without device full foot clearance, consistent foot clearance, to decrease  risk of falls.    Long Term Goals: 6 weeks   1) Increase Berg Balance Score to 51/56 to decrease risk of falls with activities of daily living.  2) Ambulate outdoor surfaces independently  without device to decrease risk of falls with ambulation.  3) Able to ascend/descend full flight of stairs with bilat rails independently.    Rehabilitation Potential:  Excellent    Treatment Today:  Evaluation    PLAN:  Plan  Risks/Benefits/POC Discussed with Pt/Family: With patient  Patient Goal: to be able to use use L arm and hand and be slef sufficient  Treatment/Interventions: Exercise;Gait training;Neuromuscular re-education;Patient/family training  PT Frequency: 2x/wk x 6-8 weeks    Therapist Signature:    Carvel Getting, PT, DPT

## 2013-05-11 ENCOUNTER — Other Ambulatory Visit: Payer: Self-pay | Admitting: Physical Medicine and Rehabilitation

## 2013-05-12 ENCOUNTER — Ambulatory Visit: Payer: Commercial Managed Care - POS

## 2013-05-12 NOTE — Telephone Encounter (Signed)
Needs to get from PCP

## 2013-05-17 ENCOUNTER — Ambulatory Visit: Payer: Commercial Managed Care - POS

## 2013-05-17 ENCOUNTER — Other Ambulatory Visit: Payer: Self-pay | Admitting: Physical Medicine and Rehabilitation

## 2013-05-17 DIAGNOSIS — R29898 Other symptoms and signs involving the musculoskeletal system: Secondary | ICD-10-CM

## 2013-05-17 DIAGNOSIS — I6350 Cerebral infarction due to unspecified occlusion or stenosis of unspecified cerebral artery: Secondary | ICD-10-CM

## 2013-05-17 NOTE — Progress Notes (Addendum)
[facility]  Pediatric and Adult Rehabilitation [facility]  [address]  [place] [address]  [phone]       Occupational Therapy EVALUATION    PATIENT: Katherine [name]   Referred By: Veneda Melter, MD  DOB: 04/18/1950     Medical Record #: [id number]  AGE: 63 y.o.     Diagnosis: CVA (cerebral infarction) [434.91];Left arm weakness [729.8*,   Start of Care: 05/17/2013    Treatment Diagnosis: L UE weakness 729.8, poor fine motor 781.99  Date of onset: 02/24/2013   Facility Provider #: [id number]  Dates of Certification: 05/17/2013 to 08/14/2013  HICN#: Patient is not covered by Medicare.    Consult received for Stephen A Marcelle Overlie for Occupational Therapy Evaluation and Treatment.  Patient's medical condition is appropriate for therapy intervention at this time.    Precautions and Contraindications: L hemi, fall risk, TIA, h/o DVT    Past Medical/Surgical History:  Past Medical History   Diagnosis Date   . Cerebrovascular accident    . Migraine    . PFO (patent foramen ovale)    . Diabetes mellitus    . Hyperlipidemia    . Seasonal allergies       Past Surgical History   Procedure Date   . Cholecystectomy    . Hysterectomy    . Total abdominal hysterectomy w/ bilateral salpingoophorectomy    . Carpal tunnel release      L   Admitted to [facility] 12/27 due to TIA  Admitted to [facility] 01/24 due to diabetic pump failure  History of DVT    History of Present Illness: Katherine [name] is a 63 y.o. female presents with onset of R CVA on 02/24/13 while she was packing up her car to visit her family.  Pt states her only symptom was dizziness; she then fell backwards and daughter alerted local sheriffs office.  Pt was admitted to a local hospital and received inpatient rehab for PT, OT and SLP.  Pt was discharged on 03/20/13 and moved to [place] to live with her daughter.  Pt now presents for ongoing outpatient OT due to weakness and poor functional use of L UE.      Medications:   has a current medication list  which includes the following prescription(s): atorvastatin, insulin aspart, insulin glargine, insulin syringe 1cc/29g, pramipexole, and rivaroxaban    Social History: Pt now lives with her daughter, son-in law and 63 yo grandson.  Pt's family is very supportive.  Pt states she is alone at home at times, but son-in law works from home many days and can assist her.    Pt is not driving.  Pt was previously independent in all ADL's and IADL's and was working full time as a Warden/ranger.      Home set-up: 4 STE with railing; flight of stairs to basement.  Full bedroom and bathroom in basement.  Pt has bed rails, RTS, shower bench in shower and no grab bars.  Pt states she completes all bathing/dressing independently with increased time and effort.  Unable to tie shoes and impaired fine motor skills for dressing.  Pt wears camisole tank top vs bra.    Pt ambulates with quad cane; at times without AD indoors.  Pt completes light meal prep.  Daughter does laundry because it is on 3rd floor, but pt does fold and put away laundry.  Pt does vacuum and complete household chores.    Patient Goal:"To get back to  being independent, driving, and re-gain function of my left hand."    Subjective: "I try to use my L arm a lot."    Objective: Pt presented to session alone with quad cane.  Pt guarding L UE during mobility.  Noted edema of L UE    Musculoskeletal Examination:   R UE ROM WFL; 4+/5 MMT  L UE ROM:  Shoulder flexion = 45* AROM with shoulder hiking; 105* PROM  Shoulder abduction = 55* AROM with shoulder hiking; 105* PROM  Elbow flexion = full ROM with increased time  Elbow extension = -25* AROM and 0* PROM  Pronation = full AROM  Supination = 45* AROM  Wrist drop = 45* flexion; able to actively extend to 0* with support    Pt with active finger flexion of all digits; no active movement of extension     Special Tests:  Negative finger to thumb on L UE  Impaired coordination L UE     Self-Care:  Don jacket with Min A;  pt able to complete with hemi-technique    Functional Mobility:   Sit <> stand: independent  Functional mobility with quad cane: Mod I    Balance:     Sitting balance: good including dynamic reaching tasks  Standing balance: Fair - Poor especially with dynamic  Reaching for item from floor level with CGA    Cognition:   Pt able to recall medical history to therapist.  Reports decreased processing speed and multi tasking    Sensation:    Intact to light touch UE's    Tone:    Flexion tone of L UE especially wrist and fingers    Vision:      Intact     Pain: no c/o during session    Outcomes:    ABC Scale = 88%; no risk for falls    Stroke Impact Scale:  -Physical Problems = .35  -Memory and Thinking = .63  -Daily Activities = .72  -Ability to be mobile = .80  -Ability to use affected hand = .20  -Ability to participate in activities = .65  -Stroke Recovery Percentage 20%    Barthel Index = 80    Assessment: Braeley is a 63 y.o. female presenting to outpatient therapy with decreased strength, AROM, fine motor skills, coordination and balance due to recent CVA impacting her full independence and safety to perform ADL's, IADL's and functional tasks.  Patient requires skilled therapy intervention to address the above deficits to maximize safety with daily activities and return to prior level of function.    Therapeutic Exercise:  Performed retrograde massage and educated pt on technique at home to decrease edema    Patient Education:  Patient educated on and verbalized an understanding of the goals and benefits of therapy: OT role and POC, retrograde massage of L UE, safe positioning of L UE.    Rehabilitation Potential:Good for goals    Treatment Today:     Treatment Code # of Minutes Treatment Code # of Minutes   Therapeutic Exercise 15 Evaluation 55   Neuromuscular Re-Ed  Re-Evaluation    Manual Therapy      Therapeutic Activities        Plan: Pt to be seen to address above deficits through Therapeutic Activities,  Therapeutic Exercise, Self Care Re-training, Neuromuscular Re-ed, Manual Therapy, Orthotics Fit/Training, E-stim modalities, IADL re-training, pt/family caregiver training and HEP.    Frequency of treatment: 1-2 times per week for 12 weeks.  Short Term Goals:  6 weeks:  1. Pt will be independent in HEP consisting of proper positioning of L UE, edema management, ROM and fine motor exercises to increase functional movement and protect muscle integrity.  2. Pt will demo increase of active movement of L UE by at least 10* all planes in order to increase functional independence with ADL's and daily tasks.  3. Pt will be independent in don/doff and wearing schedule of Saebo Stretch splint in order to decrease tone of L UE and increase functional ROM.  4. Pt will don/doff shirt/jacket in <30 seconds after set-up with Mod I and no cues in order to increase independence and performance with ADL's.    Long Term Goals:  12 weeks:  1.  Pt will demo increase of active movement of L UE by at least 20* all planes from initial evaluation in order to increase functional independence with ADL's and daily tasks.  2. Pt will increase L UE fine motor skills required for ADL's and daily tasks through ability to perform 9-hole peg test.  3. Pt will demo increase in functional use of L UE and ability to participate in ADL's and IADLs through increase stroke impact overall recovery scale to >50%.  4. Pt will demo increase in functional use of L UE through ability to demo self-feeding and grooming tasks in appropriate timeframe.  5. Pt will demo UB/LB dressing in sit/stand combo with use of hemi-technique and no cues for safety at Mod I level.    Luvenia Redden MS OTR/L  Occupational Therapist                                                          I agree to above plan of care:    Physician's Signature: _______________________________      Date:

## 2013-05-19 ENCOUNTER — Ambulatory Visit: Payer: Commercial Managed Care - POS

## 2013-05-19 ENCOUNTER — Telehealth: Payer: Self-pay

## 2013-05-19 DIAGNOSIS — I639 Cerebral infarction, unspecified: Secondary | ICD-10-CM

## 2013-05-19 NOTE — Telephone Encounter (Signed)
ok 

## 2013-05-19 NOTE — Telephone Encounter (Signed)
Order for OT/PT was placed.

## 2013-05-19 NOTE — Telephone Encounter (Signed)
Brittney called to get an updated PT/OT referral.  Fax referral to [phone]

## 2013-05-21 ENCOUNTER — Ambulatory Visit: Payer: Commercial Managed Care - POS

## 2013-05-24 ENCOUNTER — Ambulatory Visit: Payer: Commercial Managed Care - POS | Admitting: Registered"

## 2013-05-24 ENCOUNTER — Ambulatory Visit: Payer: Commercial Managed Care - POS

## 2013-05-24 DIAGNOSIS — I6350 Cerebral infarction due to unspecified occlusion or stenosis of unspecified cerebral artery: Secondary | ICD-10-CM

## 2013-05-24 DIAGNOSIS — R29898 Other symptoms and signs involving the musculoskeletal system: Secondary | ICD-10-CM

## 2013-05-24 NOTE — Progress Notes (Signed)
[facility]  Pediatric and Adult Rehabilitation [facility]  [address]  [place] [address]  [phone]    Occupational Therapy Daily Note    PATIENT: Katherine [name]   Referred By: Veneda Melter, MD  DOB: 09-Aug-1950     Medical Record #: [id number]  AGE: 63 y.o.     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  Visit: 2    Precautions and Contraindications:  L hemi, fall risk, TIA, h/o DVT    Subjective: "I try to always stretch my L UE"    Objective: Pt presented alone in waiting room with quad cane.    Self-Care:  Doff jacket with Min A    Neuromuscular Re-Education:   Retrograde massage with lotion while pt supine to decrease edema of hand.  Pt educated on technique.    AAROM supine:  -shoulder flexion to about 95* due to tightness 15 reps  -shoulder abduction 15 reps  -elbow flexion/extension 15 reps  -forearm supination/pronation 20 reps  -wrist extension with extended stretch held to decrease tone 10 reps  -finger extension all digits with stretch held    Cane exercises while supine to increase AROM:  -overhead shoulder flexion 10 reps  -chest press 8 reps with Min A    Weight bearing to increase awareness, sensory and stretch to L UE with focus on wrist extension:  -sitting with elbow prop 5 reps  -sitting on hand prop with Min A to reach position 5 reps  -pt unable to perform quadriped due to decreased strength LE at this time; modified to prone weight bearing on forearms  -side lying weight bearing on elbows held x1 minute    Functional mobility with cane and CGA; transfer on mat with CGA; Mod A to reach prone    Pain: no c/o     Patient Education:  Patient educated on positioning of UE, HEP and active use of  UE.  Pt verbalized understanding of education.    Home Exercise Program: Pt provided with HEP consisting of AROM with cane, AAROM with assist from daughter for shoulder, elbow, forearm, wrist and hand and specific stretches and ROM for wrist and fingers.  Pt states her daughter can help her.  Educated on technique to perform HEP.      Assessment: Pt with good tolerance with AAROM exercises and weight bearing at seated and side lying level.  Pt provided with initial HEP.  Pt continues to require skilled OT intervention due to decreased strength, coordination and fine motor skills of L UE limiting her independence with ADL's, functional mobility and IADL's.    Goal Status:  Progressing    Plan:  Patient will benefit from continued skilled therapeutic intervention to address the above functional goals - Continue plan of care with the following special instructions for next visit: continue with weight bearing tasks  Goals:  Short Term Goals: 6 weeks:   1. Pt will be independent in HEP consisting of proper positioning of L UE, edema management, ROM and fine motor exercises to increase functional movement and protect muscle integrity.   2. Pt will demo increase of active movement of L UE by at least 10* all planes in order to increase  functional independence with ADL's and daily tasks.   3. Pt will be independent in don/doff and wearing schedule of Saebo Stretch splint in order to decrease tone of L UE and increase functional ROM.   4. Pt will don/doff shirt/jacket in <30 seconds after set-up with Mod I and no cues in order to increase independence and performance with ADL's.   Long Term Goals: 12 weeks:   1. Pt will demo increase of active movement of L UE by at least 20* all planes from initial evaluation in order to increase functional independence with ADL's and daily tasks.   2. Pt will increase L UE fine motor skills required for ADL's and daily tasks through ability to perform 9-hole peg test.   3. Pt will demo  increase in functional use of L UE and ability to participate in ADL's and IADLs through increase stroke impact overall recovery scale to >50%.   4. Pt will demo increase in functional use of L UE through ability to demo self-feeding and grooming tasks in appropriate timeframe.   5. Pt will demo UB/LB dressing in sit/stand combo with use of hemi-technique and no cues for safety at Mod I level.      Luvenia Redden MS OTR/L  Occupational Therapist

## 2013-05-24 NOTE — Progress Notes (Addendum)
[facility]  [address]  [place], [place]  [address]  Phone:  [phone]  Fax:  [phone]    PHYSICAL THERAPY DAILY TREATMENT NOTE    PATIENT: Katherine [name] DOB: [birth year]   MR #: [id number]  AGE: 63 y.o.    REFERRING MD:  Veneda Melter, MD PRIMARY MD: Veneda Melter, MD    CERTIFICATION DATES:     05/10/2013- 08/05/2013 DIAGNOSES: CVA (cerebral infarction) [434.91]          Date of Service PT Received On: 05/24/13   Treatment Time Start Time: 1500 to Stop Time: 1600   Time Calculation Time Calculation (min): 60 min   Visit # PT Visit  PT Visit Number: 2   Units Billed   Therapeutic Interventions  $ PT Gait Training (46962): 8-22 mins  $ PT Therapeutic Exercise (97110): 8-22 mins  $ PT Neuromuscular Re-education [phone]): 8-22 mins  $ PT Therapeutic Activity (97530): 1 unit       Precautions/Contraindications:  Fall Risk    Medications:  has a current medication list which includes the following prescription(s): atorvastatin, insulin aspart, insulin glargine, insulin syringe 1cc/29g, pramipexole, and rivaroxaban.  Patient/Caregiver does not report changes to medication at this time.    SUBJECTIVE:   Feeling okay today   Pain report:  none    OBJECTIVE:  Treatment Performed:  THERAPEUTIC ACTIVITIES -  Bed mobility - supine <> prone with mod A.  Supine to sit with dist S.   Transitions - prone to quadriped with mod A - unable to push thru L UE to get into complete quadriped, so WB thru elbows instead.     GAIT - focus on balance during ambulation without a device.  Focus on foot clearance and equalizing step length.  Tandem walking, backwards walking and sidestepping - all 4 x20 ft with CGA.     NEUROMUSCULAR RE-EDUCATION   Activities to promote balance sensory organization per flowsheet    THERAPEUTIC EXERCISES  Therapeutic Exercises per Flow Sheet. For LE stretngthening and whole body warm up.      EXERCISE SPECIFICS DATE:  05/24/2013 DATE: DATE:   Ther Ex  Bridges    x10      Bridge  + marches  x5 cycles     Nustep   @ L1                                 NM Re-Ed  Alternating toe taps at 4 inch step  2x10 with min A     Standing with feet together Firm surface with eyes open/ eyes closed 20sec/15-20sec     Semi tandem stance Firm surface with eyes open 8-12 sec and notable repeated LOB     Tandem stance  Firm surface with eyes open  2-5sec  And repeated LOB                            Education/Home Exercise Program:    [x]   No updates to home program today  []   HEP updated:    ASSESSMENT:   Goals:  Short Term Goals:  3 weeks  1. Independent with initial home exercise program.  2. Decrease Timed Get up and Go to 12 seconds to decrease risk of falls.  3. Ambulate indoor surfaces independently without device full  foot clearance, consistent foot clearance, to decrease risk of falls.    Long Term Goals: 6 weeks   1. Increase Berg Balance Score to 51/56 to decrease risk of falls with activities of daily living.  2. Ambulate outdoor surfaces independently without device to decrease risk of falls with ambulation.   3. Able to ascend/descend full flight of stairs with bilat rails independently.      STG Progress Toward Goals   1 Progressing   2 Progressing   3 Progressing     LTG's Progress Toward Goals   1 Progressing   2 Not Addressed Today   3 Not Addressed Today     ASSESSMENT:   Katherine [name] is a 63 y.o. female presenting to outpatient therapy with decreased motor control, decreased static/dynamic balance, and increased tone resulting in decreased safety and independence with functional mobility.  Patient is improving with ambulating without device.  Patient is slowly progressing well toward goals listed below:    Goals:  Short Term Goals:  3 week  1. Independent with initial home exercise program  2. Decrease Timed Get up and Go to 12 seconds to decrease risk of falls.  3. Ambulate indoor surfaces independently without  device full foot clearance, consistent foot clearance, to decrease risk of falls.    Long Term Goals: 6 weeks   1. Increase Berg Balance Score to 51/56 to decrease risk of falls with activities of daily living.  2. Ambulate outdoor surfaces independently without device to decrease risk of falls with ambulation.   3. Able to ascend/descend full flight of stairs with bilat rails independently.      STG Progress Toward Goals   1 Progressing   2 Progressing   3 Progressing     LTG's Progress Toward Goals   1 Progressing   2 Not Addressed Today   3 Not Addressed Today         PLAN:   Patient requires continued skilled intervention in order to increase patient safety and independence with daily activities. and meet above mentioned functional goals.  Focus on core strength, balance and  next visit.    Therapist Signature:   Carvel Getting, PT, DPT

## 2013-05-26 ENCOUNTER — Ambulatory Visit: Payer: Commercial Managed Care - POS

## 2013-05-26 DIAGNOSIS — R41844 Frontal lobe and executive function deficit: Secondary | ICD-10-CM

## 2013-05-26 DIAGNOSIS — I6350 Cerebral infarction due to unspecified occlusion or stenosis of unspecified cerebral artery: Secondary | ICD-10-CM

## 2013-05-26 NOTE — SLP Eval Note (Signed)
[facility]  Pediatric and Adult Rehabilitation [facility]  [address]  [place], [place]  [address]  [phone]  [phone]    Speech-Language Pathology Initial Evaluation and Plan of Care    PATIENT: Katherine [name]    Referred By: Veneda Melter, MD  DOB:   1951-02-04                                 Medical Record #: [id number]  AGE:   63 y.o.                          Primary Diagnosis: CVA (cerebral infarction) [434.91]  Start of Care: 05/26/13                    Treatment Diagnosis: 799.55 Cognitive deficits after CVA  Date of onset:  02/24/13                     Facility Provider #: [phone]  Dates of Certification: 05/26/13 to 11/23/13             HICN# Patient is not covered by Medicare.      Order received for Speech-Language Evaluation and Treatment.    Precautions and Contraindications: Precautions: falls/assistive device for ambulation (quad cane for ambulation.)    Past Medical/Surgical History:   Past Medical History   Diagnosis Date   . Cerebrovascular accident    . Migraine    . PFO (patent foramen ovale)    . Diabetes mellitus    . Hyperlipidemia    . Seasonal allergies      Past Surgical History   Procedure Date   . Cholecystectomy    . Hysterectomy    . Total abdominal hysterectomy w/ bilateral salpingoophorectomy    . 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  Pt now lives with her daughter, son-in law and 28 yo grandson. Pt's family is very supportive. Pt states she is alone at home at times, but son-in law works from home many days and can assist her.    Medications: has a current medication list which includes the following prescription(s): atorvastatin, insulin aspart, insulin glargine, insulin syringe 1cc/29g, pramipexole, and rivaroxaban.    History of Present Illness: Katherine [name] is a 63 y.o. female Subjective Report:: Katherine [name] is a 63 y.o. female presents with onset of R CVA on 02/24/13  while she was packing up her car to visit her family. Pt states her only symptom was dizziness; she then fell backwards and daughter alerted local sheriffs office. Pt was admitted to a local hospital and received inpatient rehab for PT, OT and SLP. Pt was discharged on 03/20/13 and moved to [place] to live with her daughter. Pt now presents for outpatient speech-therapy with complaints of cognitive loss..  02/24/13, acute st until 12/20, outpatient ot/pt - no st.  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  Her goal is to get back to work, and feels that she may not speech barriers to that.  Cognitive changes are more refined, but suspects would not affect her return to work (she reported difficulty with today's tasks, though and later wondered if deficits were worse that  anticipated).  Pt needs to be able to return to use of computer, left-handed but left-sided hemiparesis.  No current c/o dysphagia, but previously working on dysphagia goals.  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    Prior level of function:  Lives independently, Regular solid diet and Thin liquid diet    Patient Goal:  Patient Goal: To improve cognition, return to work and use a computer for return to work.    Subjective:  Subjective Report:: Katherine [name] is a 63 y.o. female presents with onset of R CVA on 02/24/13 while she was packing up her car to visit her family. Pt states her only symptom was dizziness; she then fell backwards and daughter alerted local sheriffs office. Pt was admitted to a local hospital and received inpatient rehab for PT, OT and SLP. Pt was discharged on 03/20/13 and moved to [place] to live with her daughter. Pt now presents for outpatient speech-therapy with complaints of cognitive loss.       Observation: Observation:: Pt presents with right-sided hemiparesis.  Mild left facial asymmetry and weakness does not appear to  effect speech intelligibility.       Objective: Objective Findings:: see below    Special Tests: TOMAL 2    Receptive Language:   Receptive Language  1-step directions: WFL  2-step directions: WFL  3-step directions: WFL  Auditory Comprehension: Mildly-impaired (with greater length of passage.)  Object identification: WFL  Photo identification: WFL  Line drawing identification: WFL    Expressive Language:     WFL     Orientation:   WFL    Writing:  Unable to assess due to hemiparesis in dominant hand.  Pt receives OT.    Attention/Memory:  Attention/Memory  Sustained attention: [facility]  Immediate recall: WFL  **Further assessed below    Cognition/Executive Function:   SLP initiated The Test of Memory and Learning: Second Edition (TOMAL 2).  The TOMAL is an individually administered test suitable for evaluating memory function in four verbal and four nonverbal subtests, as well as six supplementary subtests and the two verbal delayed recall subtests.  Portions of, or the entirety, were presented to the patient as listed below with results.    Two primary types of normative scores--standard scores and percentile ranks--are provided for interpretation of performance on the TOMAL-2 subtests and composite indexes.  Subtests are scaled to a mean of 10 and a standard deviation of 3.Composite indexes have a mean of 100 and a standard deviation of 15.  Standard scores indicate the distance of scores from the norm-group mean.  A percentile rank indicates the percentage of the norm sample scoring at or below a specified score.    Subtest Scaled Score Percentile Rank   Memory for Stories 9 37   Facial Memory 5 5   Word Selective Reminding 10 50   Abstract Visual Memory 7 16   Object Recall 8 25   Visual Sequential Memory     Paired Recall     Memory for Location     Digits Forward 11 63   Visual Selective Reminding 13 84   Letters Forward     Manual Imitation     Digits Backward     Letters Backward     Memory for Stories Delayed 9  37   Word Selective Reminding Delayed     **Test was not completed due to time restraints and will be completed during next  tx session.    Oral/Motor/Swallowing:   Pt previously had tx for dysphagia but has no current c/o at this time.  Pt OM strength and ROM WFL.  Although, pt noted to have mild-moderate labial assymmetry that has mild-no impact on speech intelligibility.     Pain :   Pain report:  [x]   [name] reported today.  []   Pain level  /10 today.  Location:      Assessment: Katherine [name] is a 63 y.o. female referred to outpatient therapy for CVA (cerebral infarction) [434.91].  Patient presents with mild to moderate cognitive changes characterized by memory loss, difficulty with slow auditory comprehension and nonverbal memory tasks, difficulty with judgment and writing. Patient requires skilled therapy intervention to address Assessment: Impaired Memory, as well as the above mentioned deficits to maximize safety with daily activities and return to prior level of function.    Patient Education:  Patient educated on Other:: Plan of Care and verbalized an understanding of the goals and benefits of therapy.    Rehabilitation Potential:  Prognosis:: Excellent    Treatment Today: Treatment Today  Treatment Today: Evaluation    Treatment Code # of Minutes Treatment Code # of Minutes   Speech-language Treatment  Speech-language Evaluation    Voice Treatment  Voice Evaluation    Dysphagia Treatment  Dysphagia Evaluation    Cognitive Treatment  Cognitive Evaluation    Self-care/Home Management          Plan:  Plan  Frequency: Weekly  Duration: 6 months    Short Term Goals:   To be completed by 08/23/13:  1.  Pt will complete Test of Memory and Learning x 100%.  2.  Pt will recall memory strategies x 100% I.  3.  Pt will demonstrate use of memory strategies to organize a list of 20 words for recall x 80% with min cues.    Long Term Goals:  To be completed by 11/23/13:  1.  Pt will answer questions from visual  stimulus of 3-5 minutes x 80% with min cues.   2.  Pt will recall a list of 20 words x 90% with self-cues.  3.  Pt will demonstrate immediate recall of location of 4-8 objects x 90% with self-cues.                                                    Halen Mossbarger, MEd, Information systems manager Pathologist/Senior Therapist  [facility]  Pediatric and Adult Rehabilitation [email or web address]        I agree to above plan of care:    Physician's Signature: ___________________________________________________

## 2013-05-27 NOTE — Progress Notes (Addendum)
[facility]  [address]  [place], [place]  [address]  Phone:  [phone]  Fax:  [phone]    PHYSICAL THERAPY DAILY TREATMENT NOTE    PATIENT: Katherine [name] DOB: 12-18-1950   MR #: [id number]  AGE: 63 y.o.    REFERRING MD:  Veneda Melter, MD PRIMARY MD: Veneda Melter, MD    CERTIFICATION DATES:     05/10/2013- 08/05/2013  DIAGNOSES: CVA (cerebral infarction) [434.91]          Date of Service PT Received On: 05/26/13   Treatment Time Start Time: 1500 to Stop Time: 1600   Time Calculation Time Calculation (min): 60 min   Visit # PT Visit  PT Visit Number: 3   Units Billed   Therapeutic Interventions  $ PT Gait Training (46962): 8-22 mins  $ PT Therapeutic Exercise (97110): 8-22 mins  $ PT Neuromuscular Re-education [phone]): 23-37 mins     Precautions/Contraindications:  Fall Risk    Medications:  has a current medication list which includes the following prescription(s): atorvastatin, insulin aspart, insulin glargine, insulin syringe 1cc/29g, pramipexole, and rivaroxaban.  Patient/Caregiver does not report changes to medication at this time.    SUBJECTIVE:   Pt reports she fell at home yesterday - she tripped over the edge of a throw rug.  Pt states she does not think she needs to see her MD about the fall - she feels fine except for some aches here and there.  Pain report:  Moderate on L side of body    OBJECTIVE:  Treatment Performed:  GAIT - focus on balance during ambulation with a cane.  Focus on foot clearance and equalizing step length.  Tandem walking, backwards walking, high marches (with minimal UE support) and sidestepping - all 4 x20 ft with CGA.     NEUROMUSCULAR RE-EDUCATION   Activities to promote balance sensory organization per flowsheet    THERAPEUTIC EXERCISES  Therapeutic Exercises per Flow Sheet. For LE stretngthening and whole body warm up.      EXERCISE SPECIFICS DATE: 05/24/2013 DATE:  05/26/2013 DATE:   Ther  Ex  Bridges    x10  Feet on ball - x10  Feet on BOSU  Dome side - x10      Bridge  + marches  x5 cycles x5 cycles    Nustep   @ L1 @L1                                 NM Re-Ed  Alternating toe taps at 4 inch step  2x10 with min A     Standing with feet together Firm surface with eyes open/ eyes closed 20sec/15-20sec On Foam with feet together and eyes open up to 15 sec    Semi tandem stance Firm surface with eyes open 8-12 sec and notable repeated LOB   8-12 sec and notable repeated LOB    Tandem stance  Firm surface with eyes open  2-5sec  And repeated LOB   2-5sec  And repeated LOB    Step and lunge  Forwards / Sideways   minimal UE support  10/10                    Education/Home Exercise Program:    Educated pt on home safety - recommended pt take up rugs in the home for  safety and try to keep toys off the floor.    [x]   No updates to home program today  []   HEP updated:    ASSESSMENT:   Katherine [name] is a 63 y.o. female presenting to outpatient therapy with decreased motor control, decreased static/dynamic balance, and increased tone resulting in decreased safety and independence with functional mobility.  Patient is improving with ambulating without device.  Patient is slowly progressing well toward goals listed below:    Goals:  Short Term Goals:  3 week  1. Independent with initial home exercise program  2. Decrease Timed Get up and Go to 12 seconds to decrease risk of falls.  3. Ambulate indoor surfaces independently without device full foot clearance, consistent foot clearance, to decrease risk of falls.    Long Term Goals: 6 weeks   1. Increase Berg Balance Score to 51/56 to decrease risk of falls with activities of daily living.  2. Ambulate outdoor surfaces independently without device to decrease risk of falls with ambulation.   3. Able to ascend/descend full flight of stairs with bilat rails independently.      STG Progress Toward Goals   1 Progressing   2 Progressing    3 Progressing     LTG's Progress Toward Goals   1 Progressing   2 Not Addressed Today   3 Not Addressed Today         PLAN:   Patient requires continued skilled intervention in order to increase patient safety and independence with daily activities. and meet above mentioned functional goals.  Focus on core strength, balance and  next visit.    Therapist Signature:   Carvel Getting, PT, DPT

## 2013-05-28 ENCOUNTER — Encounter (INDEPENDENT_AMBULATORY_CARE_PROVIDER_SITE_OTHER): Payer: Self-pay

## 2013-05-28 DIAGNOSIS — Z0289 Encounter for other administrative examinations: Secondary | ICD-10-CM

## 2013-05-31 ENCOUNTER — Ambulatory Visit: Payer: No Typology Code available for payment source

## 2013-05-31 ENCOUNTER — Ambulatory Visit: Payer: Commercial Managed Care - POS

## 2013-05-31 DIAGNOSIS — I6350 Cerebral infarction due to unspecified occlusion or stenosis of unspecified cerebral artery: Secondary | ICD-10-CM

## 2013-05-31 DIAGNOSIS — M6281 Muscle weakness (generalized): Secondary | ICD-10-CM | POA: Insufficient documentation

## 2013-05-31 DIAGNOSIS — G8190 Hemiplegia, unspecified affecting unspecified side: Secondary | ICD-10-CM

## 2013-05-31 DIAGNOSIS — R29818 Other symptoms and signs involving the nervous system: Secondary | ICD-10-CM

## 2013-05-31 DIAGNOSIS — R29898 Other symptoms and signs involving the musculoskeletal system: Secondary | ICD-10-CM

## 2013-05-31 DIAGNOSIS — I69998 Other sequelae following unspecified cerebrovascular disease: Secondary | ICD-10-CM | POA: Insufficient documentation

## 2013-05-31 NOTE — Progress Notes (Signed)
[facility]  [address]  [place], [place]  [address]  Phone:  [phone]  Fax:  [phone]    PHYSICAL THERAPY DAILY TREATMENT NOTE    PATIENT: Katherine [name] DOB: 18-May-1950   MR #: [id number]  AGE: 63 y.o.    REFERRING MD:  Veneda Melter, MD PRIMARY MD: Veneda Melter, MD    CERTIFICATION DATES:     05/10/2013 to 08/05/2013    DIAGNOSES: CVA (cerebral infarction) [434.91]          Date of Service PT Received On: 05/31/13   Treatment Time Start Time: 1500 to Stop Time: 1600   Time Calculation Time Calculation (min): 60 min   Visit # PT Visit  PT Visit Number: 4   Units Billed   Therapeutic Interventions  $ PT Therapeutic Exercise (97110): 23-37 mins  $ PT Neuromuscular Re-education [phone]): 23-37 mins     Precautions/Contraindications:  Fall Risk    Medications:  has a current medication list which includes the following prescription(s): atorvastatin, insulin aspart, insulin glargine, insulin syringe 1cc/29g, pramipexole, and rivaroxaban.  Patient/Caregiver does not report changes to medication at this time.    SUBJECTIVE:   Pt reporting no problems over weekend  Pain report:  no c/o pain    OBJECTIVE:  Treatment Performed:   NEUROMUSCULAR RE-EDUCATION   Activities to promote balance sensory organization per flowsheet    THERAPEUTIC EXERCISES  Therapeutic Exercises per Flow Sheet. For LE stretngthening and whole body warm up.      EXERCISE  SPECIFICS  DATE: 05/24/2013  DATE: 05/26/2013  DATE:  05/31/2013   Ther Ex   Henreitta Leber      x10   Feet on ball - x10   Feet on BOSU  Dome side - x10      Feet on BOSU  Dome side - x10   Bridge  + marches    x5 cycles  x5 cycles   x5 cycles    Nustep     @ L1  @L1    @L1     Prone hip extension        x10                                       NM Re-Ed   Alternating toe taps at 4 inch step        2x10 with min A         2x10 with CGA   Standing with feet  together  Firm surface with eyes open/ eyes closed  20sec/15-20sec  On Foam with feet together and eyes open up to 15 sec      Semi tandem stance  Firm surface with eyes open  8-12 sec and notable repeated LOB    8-12 sec and notable repeated LOB   10-15 sec   Tandem stance   Firm surface with eyes open   2-5sec  And repeated LOB    2-5sec  And repeated LOB      Step and lunge   Forwards / Sideways     minimal UE support  10/10   minimal UE support  10/10   Seated on theraball       Bouncing - x2 min   Marching - 3x83marches  Education/Home Exercise Program:    Discussed modifying treatment plan to allow for more OT as she feels a greater need for OT services.    She reports she is able to keep up with HEP.  [x]   No updates to home program today  [ ]   HEP updated:    ASSESSMENT:   Katherine [name] is a 63 y.o. female presenting to outpatient therapy with decreased motor control, decreased static/dynamic balance, and increased tone resulting in decreased safety and independence with functional mobility.    Patient is progressing well toward goals listed below:    Goals:  Short Term Goals:  3 week  1. Independent with initial home exercise program  2. Decrease Timed Get up and Go to 12 seconds to decrease risk of falls.  3. Ambulate indoor surfaces independently without device full foot clearance, consistent foot clearance, to decrease risk of falls.    Long Term Goals: 6 weeks   1. Increase Berg Balance Score to 51/56 to decrease risk of falls with activities of daily living.  2. Ambulate outdoor surfaces independently without device to decrease risk of falls with ambulation.   3. Able to ascend/descend full flight of stairs with bilat rails independently.        STG  Progress Toward Goals    1  Goal met   2  Progressing    3  Progressing        LTG's  Progress Toward Goals    1  Progressing    2  Not Addressed Today    3  Not Addressed Today          PLAN:   Patient requires continued skilled  intervention in order to increase patient safety and independence with daily activities and meet above mentioned functional goals.  Focus on core strength, balance and  next visit.  Will likely decr frequency of PT to 1x/week per pt request - will await pt feedback and coordinate with other disciplines for smooth transition.     Therapist Signature:    Carvel Getting, PT, DPT

## 2013-05-31 NOTE — Progress Notes (Signed)
[facility]  Pediatric and Adult Rehabilitation [facility]  [address]  [place] [address]  [phone]    Occupational Therapy Daily Note    PATIENT: Katherine [name]   Referred By: Veneda Melter, MD  DOB: [birth year]     Medical Record #: [id number]  AGE: 63 y.o.     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  Visit: 3    Precautions and Contraindications:  L hemi, fall risk, TIA, h/o DVT    Subjective: "I vacuumed this morning."    Objective: Pt presented alone in waiting room with quad cane.  Pt reports fall last week after she tripped over the edge of an area rug.  Reports no pain or c/o at this time.    Self-Care:  Reviewed use of elastic shoelaces due to pt with both shoes untied at beginning of session.  Provided pt with purchasing information for local stores.  Pt agreed.    Neuromuscular Re-Education:   Retrograde massage with lotion while pt supine to decrease edema of hand.  Pt educated on technique.    AAROM supine:  -shoulder flexion to about 95* due to tightness 15 reps  -shoulder abduction 15 reps  -elbow flexion/extension 15 reps  -forearm supination/pronation 20 reps  -wrist extension with extended stretch held to decrease tone 10 reps  -finger extension all digits with stretch held  -D1,D2 patterns 10 reps each    Cane exercises while supine to increase AROM:  -overhead shoulder flexion 10 reps  -chest press 10 reps with Min A    Weight bearing to increase awareness, sensory and stretch to L UE with focus on wrist extension:  -sitting with elbow prop 5 reps with Min A  -small push ups on elbow prop 10 reps with Mod A  -sitting on hand prop with Min A to reach position  -side lying weight bearing on  elbows held x1 minute    Fine motor and wrist exercises:  -wrist flexion/extension off edge of table top 10 reps  -stacking cones with Mod A to grasp/release  -thumb slides on cone with lotion with Mod A; movement of thumb noted at end    Functional mobility with cane and CGA; transfer on mat with CGA; Mod A to reach prone    Pain: slight pain L shoulder with gentle ROM    Patient Education:  Patient educated on decreasing shoulder hikes during exercises and functional movements to help decrease shoulder pain.  Pt educated on home safety and reviewed full checklist with pt.  Educated on Surveyor, mining for increased safety.  Pt verbalized understanding of education.    Home Exercise Program: Pt provided with HEP consisting of AROM with cane, AAROM with assist from daughter for shoulder, elbow, forearm, wrist and hand and specific stretches and ROM for wrist and fingers.  Addition of supine extension stretches and progressing to snow angels supine.  Educated on technique to perform HEP.      Assessment: Pt with shoulder pain noted with ROM today.  Improved active movement of wrist and thumb after exercise.  Pt with good motivation and compliance with HEP.  Pt continues to require skilled OT intervention due to decreased strength, coordination and  fine motor skills of L UE limiting her independence with ADL's, functional mobility and IADL's.    Goal Status:  Progressing    Plan:  Patient will benefit from continued skilled therapeutic intervention to address the above functional goals - Continue plan of care with the following special instructions for next visit: continue with weight bearing tasks, addition of supine snow angels  Goals:  Short Term Goals: 6 weeks:   1. Pt will be independent in HEP consisting of proper positioning of L UE, edema management, ROM and fine motor exercises to increase functional movement and protect muscle integrity.   2. Pt will demo increase of active movement of L UE by at least 10*  all planes in order to increase functional independence with ADL's and daily tasks.   3. Pt will be independent in don/doff and wearing schedule of [place] Stretch splint in order to decrease tone of L UE and increase functional ROM.   4. Pt will don/doff shirt/jacket in <30 seconds after set-up with Mod I and no cues in order to increase independence and performance with ADL's.   Long Term Goals: 12 weeks:   1. Pt will demo increase of active movement of L UE by at least 20* all planes from initial evaluation in order to increase functional independence with ADL's and daily tasks.   2. Pt will increase L UE fine motor skills required for ADL's and daily tasks through ability to perform 9-hole peg test.   3. Pt will demo increase in functional use of L UE and ability to participate in ADL's and IADLs through increase stroke impact overall recovery scale to >50%.   4. Pt will demo increase in functional use of L UE through ability to demo self-feeding and grooming tasks in appropriate timeframe.   5. Pt will demo UB/LB dressing in sit/stand combo with use of hemi-technique and no cues for safety at Mod I level.      Luvenia Redden MS OTR/L  Occupational Therapist

## 2013-06-02 ENCOUNTER — Ambulatory Visit: Payer: No Typology Code available for payment source

## 2013-06-02 ENCOUNTER — Ambulatory Visit: Payer: Commercial Managed Care - POS

## 2013-06-03 ENCOUNTER — Ambulatory Visit: Payer: Commercial Managed Care - POS

## 2013-06-07 ENCOUNTER — Ambulatory Visit: Payer: No Typology Code available for payment source

## 2013-06-07 ENCOUNTER — Ambulatory Visit: Payer: Commercial Managed Care - POS

## 2013-06-07 DIAGNOSIS — I6350 Cerebral infarction due to unspecified occlusion or stenosis of unspecified cerebral artery: Secondary | ICD-10-CM

## 2013-06-07 DIAGNOSIS — R29898 Other symptoms and signs involving the musculoskeletal system: Secondary | ICD-10-CM

## 2013-06-07 NOTE — Progress Notes (Signed)
[facility]  Pediatric and Adult Rehabilitation [facility]  [address]  [place] [address]  [phone]    Occupational Therapy Daily Note    PATIENT: Katherine [name]   Referred By: Veneda Melter, MD  DOB: [birth year]     Medical Record #: [id number]  AGE: 63 y.o.     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  Visit: 4    Precautions and Contraindications:  L hemi, fall risk, TIA, h/o DVT    Subjective: "I feel like I stretch all the time."    Objective: Pt presented alone in waiting room with quad cane.      Self-Care:  Reviewed bed positioning and ability for pt to sleep on her side.  Reviewed safety and technique.    Neuromuscular Re-Education:   Retrograde massage with lotion while pt supine to decrease edema of hand.  Pt educated on technique.    AAROM supine:  -shoulder flexion to about 95* due to tightness 15 reps  -shoulder abduction 15 reps  -elbow flexion/extension 15 reps  -forearm supination/pronation 20 reps  -wrist extension with extended stretch held to decrease tone 10 reps  -finger extension all digits with stretch held  -D1,D2 patterns 10 reps each  -snow angels with Mod A 5 reps    Cane exercises while supine to increase AROM:  -overhead shoulder flexion 10 reps  -chest press 10 reps with Min A  -abduction/adduction 10 reps    Weight bearing to increase awareness, sensory and stretch to L UE with focus on wrist extension:  -sitting with elbow prop 5 reps with Min A  -sitting on hand prop with Min A to reach position    Fine motor and wrist exercises:  -wrist flexion/extension on towel to raise surface; pt able to complete 5 on her own then requires Mod A  -wrist ulnar/radial deviation  -digits  open/close with active flexion noted only    Pain: slight pain L shoulder with gentle ROM    Patient Education:  Patient educated on wearing wrist cock up splint during day to increase stretch of wrist extensors.  Pt educated on edema glove, and safe positioning of UE while sleeping and walking.  Pt educated on Saebo splint and current insurance difficulties; therapist contacting main company at this time.  Pt verbalized understanding of education.    Home Exercise Program: Pt provided with HEP consisting of AROM with cane, AAROM with assist from daughter for shoulder, elbow, forearm, wrist and hand and specific stretches and ROM for wrist and fingers.  Addition of supine extension stretches and progressing to snow angels supine.  Educated on technique to perform HEP.      Assessment: Pt with good carry over and recall of HEP.  Improved extension positioning of L UE after exercises.  Improving edema of L hand noted. Pt continues to require skilled OT intervention due to decreased strength, coordination and fine motor skills of L UE limiting her independence with ADL's, functional mobility and IADL's.    Goal Status:  Progressing    Plan:  Patient will benefit from continued skilled therapeutic intervention to address the above functional goals - Continue plan of care with the following special instructions for  next visit: continue with weight bearing tasks, addition of supine snow angels  Goals:  Short Term Goals: 6 weeks:   1. Pt will be independent in HEP consisting of proper positioning of L UE, edema management, ROM and fine motor exercises to increase functional movement and protect muscle integrity.   2. Pt will demo increase of active movement of L UE by at least 10* all planes in order to increase functional independence with ADL's and daily tasks.   3. Pt will be independent in don/doff and wearing schedule of Saebo Stretch splint in order to decrease tone of L UE and increase functional ROM.   4. Pt will  don/doff shirt/jacket in <30 seconds after set-up with Mod I and no cues in order to increase independence and performance with ADL's.   Long Term Goals: 12 weeks:   1. Pt will demo increase of active movement of L UE by at least 20* all planes from initial evaluation in order to increase functional independence with ADL's and daily tasks.   2. Pt will increase L UE fine motor skills required for ADL's and daily tasks through ability to perform 9-hole peg test.   3. Pt will demo increase in functional use of L UE and ability to participate in ADL's and IADLs through increase stroke impact overall recovery scale to >50%.   4. Pt will demo increase in functional use of L UE through ability to demo self-feeding and grooming tasks in appropriate timeframe.   5. Pt will demo UB/LB dressing in sit/stand combo with use of hemi-technique and no cues for safety at Mod I level.      Luvenia Redden MS OTR/L  Occupational Therapist

## 2013-06-08 NOTE — Progress Notes (Signed)
[facility]  [address]  [place], [place]  [address]  Phone:  [phone]  Fax:  [phone]    PHYSICAL THERAPY DAILY TREATMENT NOTE    PATIENT: Katherine [name] DOB: Sep 10, 1950   MR #: [id number]  AGE: 63 y.o.    REFERRING MD:  Veneda Melter, MD PRIMARY MD: Veneda Melter, MD    CERTIFICATION PERIOD:      05/10/2013 to 08/05/2013  DIAGNOSES: CVA (cerebral infarction) [434.91]          Date of Service PT Received On: 06/07/13   Treatment Time Start Time: 1505 to Stop Time: 1600   Time Calculation Time Calculation (min): 55 min   Visit # PT Visit  PT Visit Number: 5   Units Billed   Therapeutic Interventions  $ PT Therapeutic Exercise (97110): 23-37 mins  $ PT Neuromuscular Re-education [phone]): 23-37 mins     Precautions/Contraindications:  Fall Risk    Medications:  has a current medication list which includes the following prescription(s): atorvastatin, insulin aspart, insulin glargine, insulin syringe 1cc/29g, pramipexole, and rivaroxaban.  Patient/Caregiver does not report changes to medication at this time.    SUBJECTIVE:   Feeling okay  Pain report:  no c/o pain    OBJECTIVE:  Treatment Performed:   NEUROMUSCULAR RE-EDUCATION   Activities to promote balance sensory organization per flowsheet    THERAPEUTIC EXERCISES  Therapeutic Exercises per Flow Sheet. For LE strengthening and whole body warm up.      EXERCISE   SPECIFICS   DATE: 05/26/2013   DATE:  05/31/2013  DATE: 06/07/2013   Ther Ex     Henreitta Leber      Feet on ball - x10     Feet on BOSU  Dome side - x10     Feet on BOSU  Dome side - x10     Bridge  + marches      x5 cycles    x5 cycles      Nustep       @L1     @L1    @ L2   Prone hip extension         x10                    NM Re-Ed     Alternating toe taps at 4 inch step               2x10 with CGA    @ 6inch step - 2x10 with CGA  to min A.    Standing with feet together   Firm surface with  eyes open/ eyes closed   On Foam with feet together and eyes open up to 15 sec      On foam with feet together and eyes open up to 15 sec; eyes closed up to 10sec    Semi tandem stance   Firm surface with eyes open     8-12 sec and notable repeated LOB    10-15 sec     Tandem stance    Firm surface with eyes open      2-5sec  And repeated LOB      5-8 sec and some LOB    Step and lunge    Forwards / Sideways    minimal UE support  10/10    minimal UE support  10/10     Seated on theraball  Bouncing - x2 min     Marching - 3x80marches  With feet on foam:   Bouncing - x1 min     Marching - 2x10  LAQ - x10 (+ 10 no foam)    Tandem walking          x45 sec   Backwards walking    x45 sec   Toe walking    x65min                   THERAPEUTIC ACTIVITIES  Floor to stand transfer using chair to push up - completed mod I.    Education/Home Exercise Program:       [ ]   No updates to home program today  [ ]   HEP updated:    ASSESSMENT:  [name] is a 63 y.o. female presenting to outpatient therapy with decreased motor control, decreased static/dynamic balance, and increased tone resulting in decreased safety and independence with functional mobility.  She demonstrates improvement with ambulation and transfers, as well as foot clearance with step-up activities.  Patient is progressing well toward goals listed below:    Goals:  Short Term Goals:  3 week  1. Independent with initial home exercise program  2. Decrease Timed Get up and Go to 12 seconds to decrease risk of falls.  3. Ambulate indoor surfaces independently without device full foot clearance, consistent foot clearance, to decrease risk of falls.    Long Term Goals: 6 weeks   1. Increase Berg Balance Score to 51/56 to decrease risk of falls with activities of daily living.  2. Ambulate outdoor surfaces independently without device to decrease risk of falls with ambulation.    3. Able to ascend/descend full flight of stairs with bilat rails  independently.        STG   Progress Toward Goals     1   Goal met    2   Progressing     3   Progressing         LTG's   Progress Toward Goals     1   Progressing     2   Not Addressed Today     3   Not Addressed Today           PLAN:   Patient requires continued skilled intervention in order to increase patient safety and independence with daily activities and meet above mentioned functional goals.  Focus on core strength, balance and  next visit.  Will likely decr frequency of PT to 1x/week per pt request - will await pt feedback and coordinate with other disciplines for smooth transition.     Therapist Signature:   Carvel Getting, PT, DPT

## 2013-06-09 ENCOUNTER — Ambulatory Visit: Payer: No Typology Code available for payment source

## 2013-06-09 ENCOUNTER — Ambulatory Visit: Payer: Commercial Managed Care - POS

## 2013-06-10 ENCOUNTER — Ambulatory Visit: Payer: Commercial Managed Care - POS

## 2013-06-10 DIAGNOSIS — I6350 Cerebral infarction due to unspecified occlusion or stenosis of unspecified cerebral artery: Secondary | ICD-10-CM

## 2013-06-10 DIAGNOSIS — R29898 Other symptoms and signs involving the musculoskeletal system: Secondary | ICD-10-CM

## 2013-06-10 DIAGNOSIS — R41844 Frontal lobe and executive function deficit: Secondary | ICD-10-CM

## 2013-06-10 NOTE — Progress Notes (Signed)
[facility]  Pediatric and Adult Rehabilitation [facility]  [address]  [place] [address]  [phone]    Occupational Therapy Daily Note    PATIENT: Katherine [name]   Referred By: Veneda Melter, MD  DOB: 05-09-1950     Medical Record #: [id number]  AGE: 63 y.o.     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  Visit: 5    Precautions and Contraindications:  L hemi, fall risk, TIA, h/o DVT    Subjective: "I feel like I stretch all the time."    Objective: Pt presented alone in waiting room with quad cane.  Per pt she is beginning with new insurance - Community education officer.    Neuromuscular Re-Education:   Retrograde massage with lotion while pt supine to decrease edema of hand.  Pt educated on technique.    AAROM supine:  -shoulder flexion to about 95* due to tightness 15 reps  -shoulder abduction 15 reps  -elbow flexion/extension 15 reps  -forearm supination/pronation 20 reps  -wrist extension with extended stretch held to decrease tone 10 reps  -finger extension all digits with stretch held  -D1,D2 patterns 10 reps each    Cane exercises while supine to increase AROM:  -overhead shoulder flexion 10 reps  -chest press 10 reps with Min A  -abduction/adduction 10 reps with Mod A    Hemi-glide exercises: all with assist to hold glide and Min A to maintain grip strength  -shoulder flexion 10 reps  -shoulder extension 10 reps  -Diagonal D1/D2 pattern with supination 10 reps  -abduction 10 reps    Weight bearing to increase awareness, sensory and stretch to L UE with focus on wrist extension:  -sitting with elbow prop 5 reps with Min A  -standing with hand on mat with extended stretch and mini push ups with Min A    Fine motor and wrist  exercises:  -wrist flexion/extension at edge of chair 20 reps with Min A to reach neutral   -wrist ulnar/radial deviation with Min A  -digits open/close with active flexion noted only    UE arm bike with R UE wrapped and Min A x4 minutes forward and back    Pain: slight pain L shoulder with gentle ROM    Patient Education:  Patient educated on use of wrist cock up splint - pt plans to purchase on today.  Pt verbalized understanding of education.    Home Exercise Program: Pt provided with HEP consisting of AROM with cane, AAROM with assist from daughter for shoulder, elbow, forearm, wrist and hand and specific stretches and ROM for wrist and fingers.  Addition of supine extension stretches and progressing to snow angels supine.  Educated on technique to perform HEP.      Assessment: Pt with good carry over and recall of HEP.  Improved active movement of wrist today.  Pt continues to require skilled OT intervention due to decreased strength, coordination and fine motor skills of L UE limiting her independence with ADL's, functional mobility and IADL's.    Goal Status:  Progressing    Plan:  Patient will benefit from continued skilled therapeutic intervention to address the above functional goals - Continue plan of care with the  following special instructions for next visit: continue with weight bearing tasks, addition of supine snow angels  Goals:  Short Term Goals: 6 weeks:   1. Pt will be independent in HEP consisting of proper positioning of L UE, edema management, ROM and fine motor exercises to increase functional movement and protect muscle integrity.   2. Pt will demo increase of active movement of L UE by at least 10* all planes in order to increase functional independence with ADL's and daily tasks.   3. Pt will be independent in don/doff and wearing schedule of Saebo Stretch splint in order to decrease tone of L UE and increase functional ROM.   4. Pt will don/doff shirt/jacket in <30 seconds after set-up  with Mod I and no cues in order to increase independence and performance with ADL's.   Long Term Goals: 12 weeks:   1. Pt will demo increase of active movement of L UE by at least 20* all planes from initial evaluation in order to increase functional independence with ADL's and daily tasks.   2. Pt will increase L UE fine motor skills required for ADL's and daily tasks through ability to perform 9-hole peg test.   3. Pt will demo increase in functional use of L UE and ability to participate in ADL's and IADLs through increase stroke impact overall recovery scale to >50%.   4. Pt will demo increase in functional use of L UE through ability to demo self-feeding and grooming tasks in appropriate timeframe.   5. Pt will demo UB/LB dressing in sit/stand combo with use of hemi-technique and no cues for safety at Mod I level.      Luvenia Redden MS OTR/L  Occupational Therapist

## 2013-06-14 ENCOUNTER — Ambulatory Visit: Payer: Commercial Managed Care - POS

## 2013-06-14 ENCOUNTER — Ambulatory Visit: Payer: No Typology Code available for payment source

## 2013-06-14 DIAGNOSIS — G8190 Hemiplegia, unspecified affecting unspecified side: Secondary | ICD-10-CM

## 2013-06-14 DIAGNOSIS — R269 Unspecified abnormalities of gait and mobility: Secondary | ICD-10-CM

## 2013-06-14 DIAGNOSIS — I6350 Cerebral infarction due to unspecified occlusion or stenosis of unspecified cerebral artery: Secondary | ICD-10-CM

## 2013-06-14 DIAGNOSIS — R29898 Other symptoms and signs involving the musculoskeletal system: Secondary | ICD-10-CM

## 2013-06-14 NOTE — Progress Notes (Signed)
[facility]  Pediatric and Adult Rehabilitation [facility]  [address]  [place] [address]  [phone]    Occupational Therapy Daily Note    PATIENT: Katherine [name]   Referred By: Veneda Melter, MD  DOB: 1950-07-16     Medical Record #: [id number]  AGE: 63 y.o.     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  Visit: 6    Precautions and Contraindications:  L hemi, fall risk, TIA, h/o DVT    Subjective: "I love this glove."    Objective: Pt presented alone in waiting room without quad cane; states she is not using the cane in familiar places.  Pt has purchased an edema glove and has been wearing this over the weekend.  Reports improved extension stretch of hand and movement of thumb since wearing glove.    Neuromuscular Re-Education:   UE arm bike with R UE wrapped and Min A x10 minutes forward and back for reciprocal UE movement    Retrograde massage with lotion while pt supine to decrease edema of hand.      AAROM supine:  -shoulder flexion to about 95* due to tightness 15 reps  -shoulder abduction 15 reps with c/o pain at about 90* ROM  -elbow flexion/extension 15 reps  -forearm supination/pronation 20 reps  -wrist extension with extended stretch held to decrease tone 10 reps  -finger extension all digits with stretch held  -D1,D2 patterns 10 reps each  -ER/IR 20 reps    Cane exercises while supine to increase AROM:  -overhead shoulder flexion 10 reps  -chest press 10 reps with Min A  -abduction/adduction 10 reps with Mod A    Hemi-glide exercises: all with CGA-Min A to hold glide and maintain grip strength  -shoulder flexion 10 reps  -shoulder extension 10 reps  -Diagonal D1/D2 pattern with supination 10  reps  -abduction 10 reps    Weight bearing to increase awareness, sensory and stretch to L UE with focus on wrist extension:  -sitting with elbow prop 5 reps with Min A    Fine motor and wrist exercises:  -wrist flexion/extension at edge of chair 10 reps with Mod A to reach neutral   -digits open/close with active thumb movement noted on command    Pain: slight pain L shoulder with gentle ROM    Patient Education:  Patient educated on use of wrist cock up splint - pt plans to purchase on today.  Pt verbalized understanding of education.    Home Exercise Program: Pt provided with HEP consisting of AROM with cane, AAROM with assist from daughter for shoulder, elbow, forearm, wrist and hand and specific stretches and ROM for wrist and fingers.  Addition of supine extension stretches and progressing to snow angels supine.  Educated on technique to perform HEP.      Assessment: Pt with good carry over and recall of HEP.  Improving edema with wearing of glove and active movement of thumb noted today.  Pt with ongoing shoulder pain; will continue to assess and recommend pt follow up with MD.  Pt continues to require skilled OT intervention due to decreased strength, coordination and fine motor skills of L UE limiting her independence with ADL's,  functional mobility and IADL's.    Goal Status:  Progressing    Plan:  Patient will benefit from continued skilled therapeutic intervention to address the above functional goals - Continue plan of care with the following special instructions for next visit: continue with weight bearing tasks, addition of supine snow angels  Goals:  Short Term Goals: 6 weeks:   1. Pt will be independent in HEP consisting of proper positioning of L UE, edema management, ROM and fine motor exercises to increase functional movement and protect muscle integrity.   2. Pt will demo increase of active movement of L UE by at least 10* all planes in order to increase functional independence with ADL's and  daily tasks.   3. Pt will be independent in don/doff and wearing schedule of Saebo Stretch splint in order to decrease tone of L UE and increase functional ROM.   4. Pt will don/doff shirt/jacket in <30 seconds after set-up with Mod I and no cues in order to increase independence and performance with ADL's.   Long Term Goals: 12 weeks:   1. Pt will demo increase of active movement of L UE by at least 20* all planes from initial evaluation in order to increase functional independence with ADL's and daily tasks.   2. Pt will increase L UE fine motor skills required for ADL's and daily tasks through ability to perform 9-hole peg test.   3. Pt will demo increase in functional use of L UE and ability to participate in ADL's and IADLs through increase stroke impact overall recovery scale to >50%.   4. Pt will demo increase in functional use of L UE through ability to demo self-feeding and grooming tasks in appropriate timeframe.   5. Pt will demo UB/LB dressing in sit/stand combo with use of hemi-technique and no cues for safety at Mod I level.      Luvenia Redden MS OTR/L  Occupational Therapist

## 2013-06-14 NOTE — Progress Notes (Signed)
[facility]  [address], [address]  [place], [place]  [address]  Phone:  [phone]  Fax:  [phone]    Speech Therapy Daily Note    PATIENT: Katherine [name]    Referred By: Veneda Melter, MD  DOB:   October 25, 1950                                 Medical Record #: [id number]  AGE:   63 y.o.                          Primary Diagnosis: CVA (cerebral infarction) [434.91]  Facility Provider #: [phone]   HICN# Patient is not covered by Medicare.    Treatment Diagnosis:  Executive Function/Memory Deficits 799.55    Visit #: SLP Visit  SLP Visit Number: 2  Visit Limit:  20 visits pt/ot/st combined    Certification Period: 05/26/13 to 11/23/13     Time of treatment:   Time Calculation  SLP Received On: 06/10/13  Start Time: 1400  Stop Time: 1500  Time Calculation (min): 60 min    Medications:  has a current medication list which includes the following prescription(s): atorvastatin, insulin aspart, insulin glargine, insulin syringe 1cc/29g, pramipexole, and rivaroxaban.    Patient/parent does not report changes to medication at this time.    Patient Goal:  Patient Goal: To improve cognition, return to work and use a computer for return to work.    Therapy Goals:  (Copied from Evaluation)  Goal status updated today.    Short Term Goals:   To be completed by 08/23/13:  1.  Pt will complete Test of Memory and Learning x 100%.  2.  Pt will recall memory strategies x 100% I.  3.  Pt will demonstrate use of memory strategies to organize a list of 20 words for recall x 80% with min cues.    Long Term Goals:  To be completed by 11/23/13:  1.  Pt will answer questions from visual stimulus of 3-5 minutes x 80% with min cues.   2.  Pt will recall a list of 20 words x 90% with self-cues.  3.  Pt will demonstrate immediate recall of location of 4-8 objects x 90% with self-cues.    Subjective:   Pain report:  [x]   [name] reported today.  []   Pain level  /10 today.   Location:      Subjective patient report:  Pt's family insist that she continue to live with them and have concerns of her going home independently. She misses home and would love to move back.    Objective:  Objective Findings today:    SLP completedThe Test of Memory and Learning: Second Edition (TOMAL 2).  The TOMAL is an individually administered test suitable for evaluating memory function in four verbal and four nonverbal subtests, as well as six supplementary subtests and the two verbal delayed recall subtests.  Portions of, or the entirety, were presented to the patient as listed below with results.    Two primary types of normative scores--standard scores and percentile ranks--are provided for interpretation of performance on the TOMAL-2 subtests and composite indexes.  Subtests are scaled to a mean of 10 and a standard deviation of 3.Composite indexes have a mean of 100 and a standard deviation of 15.  Standard scores indicate  the distance of scores from the norm-group mean.  A percentile rank indicates the percentage of the norm sample scoring at or below a specified score.    Subtest Scaled Score Percentile Rank   Memory for Stories 9 37   Facial Memory 5 5   Word Selective Reminding 10 50   Abstract Visual Memory 7 16   Object Recall 8 25   Visual Sequential Memory **10 **50   Paired Recall **10 **50   Memory for Location **4 **<1   Digits Forward 11 63   Visual Selective Reminding **10 **50   Letters Forward 13 84   Manual Imitation **11 **63   Digits Backward **8 **25   Letters Backward **13 **84   Memory for Stories Delayed 9 37   Word Selective Reminding Delayed **13 **84   **Additional test results today.     STG 1: Met    Interventions Performed:   []  SLP provided pt with tactile cues:   []  SLP provided pt with visual cues   []  SLP provided pt with verbal cues   []  SLP provided pt with phonemic cues   []  Handouts on:   []  NeuroPsych Online: computer software focused on predetermined therapy  protocol in which the patient is systematically presented with the tracks listed below in hierarchy contingent on completing prior to progression to higher level tasks.   []  Attention Skills   []  Executive Functioning Skills   []  Memory Skills   []  Visualspatial Skills   []  Problem Solving Skills   []  Communication Skills  []    [x]  Other: Administration of TOMAL-2    Education/Home Exercise Program:    Patient was educated on deficits seen and goals established.  Pt. demonstrated an understanding of this education.    [x]   No updates to home program today    []   HEP updated:    Assessment:   Pt's more challenging subtests in the the TOMAL-2 were the nonverbal visual memory tasks.  Goals reflect these scores and work toward these deficits.   Patient presents with mild to moderate cognitive changes characterized by memory loss, difficulty with slow auditory comprehension and nonverbal memory tasks, difficulty with judgment and writing. Patient requires skilled therapy intervention to address Assessment: Impaired Memory, as well as the above mentioned deficits to maximize safety with daily activities and return to prior level of function.  Progress Toward Functional Goals:  Met  STG 1    Patient Requires Continued Skilled Care to focus on visual memory in order to increased patient safety and independence.    Plan: Continue with established plan of care with focus on memory strategies.      Treatment Code # of Minutes   SLP Swallow Treatment [phone])    SLP Treatment Indiv [phone])    SLP Dev Cog Skills Ea ([id number]) Time Calculation (min): 60 min         Therapist Signature:   Evelean Bigler, MEd, Information systems manager Pathologist/Senior Therapist  [facility]  Pediatric and Adult Rehabilitation [email or web address]  06/10/13

## 2013-06-15 NOTE — Progress Notes (Signed)
[facility]  [address], [address]  [place], [place]  [address]  Phone:  [phone]  Fax:  [phone]    PHYSICAL THERAPY DAILY TREATMENT NOTE    PATIENT: Katherine [name] DOB: [birth year]   MR #: [id number]  AGE: 63 y.o.    REFERRING MD:  Veneda Melter, MD PRIMARY MD: Veneda Melter, MD    CERTIFICATION DATES:     05/10/2013- 08/05/2013    DIAGNOSES: CVA (cerebral infarction) [434.91]          Date of Service PT Received On: 06/14/13   Treatment Time Start Time: 1502 to Stop Time: 1600   Time Calculation Time Calculation (min): 58 min   Visit # PT Visit  PT Visit Number: 6   Units Billed   Therapeutic Interventions  $ PT Gait Training (46962): 8-22 mins  $ PT Therapeutic Exercise ([id number]): 8-22 mins  $ PT Neuromuscular Re-education [phone]): 23-37 mins     Precautions/Contraindications:  Fall Risk    Medications:  has a current medication list which includes the following prescription(s): atorvastatin, insulin aspart, insulin glargine, insulin syringe 1cc/29g, pramipexole, and rivaroxaban.  Patient/Caregiver does not report changes to medication at this time.    SUBJECTIVE:   Feels good - no complaints  Pain report:  no c/o pain    OBJECTIVE:  Treatment Performed:    THERAPEUTIC EXERCISES  Therapeutic Exercises per Flow Sheet. For LE strengthening and whole body warm up.      EXERCISE    SPECIFICS    DATE:  05/31/2013   DATE: 06/07/2013  DATE: 06/14/2013   Ther Ex       Henreitta Leber       Feet on BOSU  Dome side - x10     -   Bridge  + marches         x5 cycles      -   Nustep          @L1     @ L2  @ L3-4   Prone hip extension        x10     -                    NM Re-Ed       Alternating toe taps at 4 inch step                2x10 with CGA     @ 6inch step - 2x10 with CGA  to min A.   -   Standing with feet together    Firm surface with eyes open/ eyes closed        On foam with feet together and eyes open up to 15 sec; eyes  closed up to 10sec   -   Semi tandem stance    Firm surface with eyes open     10-15 sec     -   Tandem stance     Firm surface with eyes open         5-8 sec and some LOB   -   Step and lunge     Forwards / Sideways      minimal UE support  10/10     -   Seated on theraball       Bouncing - x2 min       Marching - 3x52marches   With feet on foam:  Bouncing - x1 min      Marching - 2x10  LAQ - x10 (+ 10 no foam)   -   Tandem walking          x45 sec  -   Backwards walking      x45 sec  -   Toe walking      x53min  -                           GAIT  Amb on level surfaces (carpt or tile) indep without device.  Worked on ambulation without device on uneven surfaces with mod I, inclines and declines with dist S/ mod I, up/down curbs with S.  - all assistance for safety.      NEUROMUSCULAR RE-EDUCATION  Completed berg balance assessment and TUG  TUG = 12 sec without device  Berg Balance Score = 48/56    Education/Home Exercise Program:    Instructed pt to always have someone with her for now when she goes outside especially if she has to negotiate inclines or curbs.  Pt expressed understanding and is amenable to this.      [x]   No updates to home program today  [ ]   HEP updated:    ASSESSMENT:  Katherine [name] is a 63 y.o. female presenting to outpatient therapy with decreased motor control, and decreased static/dynamic balance resulting in decreased safety and independence with functional mobility.  She demonstrates improvement with ambulation on even and uneven surfaces, as well as curbs and transfers.  Patient is progressing well toward goals listed below:    Goals:  Short Term Goals:  3 week  1. Independent with initial home exercise program  2. Decrease Timed Get up and Go to 12 seconds to decrease risk of falls.  3. Ambulate indoor surfaces independently without device full foot clearance, consistent foot clearance, to decrease risk of falls.    Long Term Goals: 6 weeks   1. Increase Berg Balance Score to 51/56  to decrease risk of falls with activities of daily living.  2. Ambulate outdoor surfaces independently without device to decrease risk of falls with ambulation.    3. Able to ascend/descend full flight of stairs with bilat rails independently.        STG    Progress Toward Goals      1    Goal met     2    Goal met - 06/14/2013    3    Goal met - 06/14/2013        LTG's    Progress Toward Goals      1    Progressing      2    Progressing   3    Not Addressed Today            PLAN:   Patient has met all STGs and is progressing towards LTGs - she requires a little more skilled PT intervention in order to achieve LTGs.  Focus on balance, strength and gait on uneven surfaces at  next visit.      Therapist Signature:   Carvel Getting, PT, DPT

## 2013-06-16 ENCOUNTER — Ambulatory Visit: Payer: Commercial Managed Care - POS

## 2013-06-16 ENCOUNTER — Ambulatory Visit: Payer: No Typology Code available for payment source

## 2013-06-17 ENCOUNTER — Ambulatory Visit: Payer: Commercial Managed Care - POS

## 2013-06-17 DIAGNOSIS — R41844 Frontal lobe and executive function deficit: Secondary | ICD-10-CM

## 2013-06-17 DIAGNOSIS — R29898 Other symptoms and signs involving the musculoskeletal system: Secondary | ICD-10-CM

## 2013-06-17 DIAGNOSIS — I6350 Cerebral infarction due to unspecified occlusion or stenosis of unspecified cerebral artery: Secondary | ICD-10-CM

## 2013-06-17 NOTE — Progress Notes (Signed)
[facility]  Pediatric and Adult Rehabilitation [facility]  [address]  [place] [address]  [phone]    Occupational Therapy Daily Note    PATIENT: Katherine [name]   Referred By: Veneda Melter, MD  DOB: [birth year]     Medical Record #: [id number]  AGE: 63 y.o.     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  Visit: 7    Precautions and Contraindications:  L hemi, fall risk, TIA, h/o DVT    Subjective: "I don't feel well today."    Objective: Pt presented after SLP session without quad cane; states she does not feel well today; tired and headache but wants to continue with therapy    Neuromuscular Re-Education:   UE arm bike with R UE wrapped and Min A x10 minutes forward and back for reciprocal UE movement; attempted L UE only but pt required Mod-Max A    Retrograde massage with lotion while pt supine to decrease edema of hand.      AAROM supine:  -shoulder flexion to about 95* due to tightness 15 reps  -shoulder abduction 15 reps with c/o pain at about 90* ROM  -elbow flexion/extension 15 reps  -forearm supination/pronation 20 reps  -wrist extension with extended stretch held to decrease tone 10 reps  -finger extension all digits with stretch held  -D1,D2 patterns 10 reps each  -ER/IR 20 reps    Hemi-glide exercises: all with CGA-Min A to hold glide and maintain grip strength  -shoulder flexion 10 reps  -shoulder extension 10 reps  -Diagonal D1/D2 pattern with supination 10 reps  -abduction 10 reps    Weight bearing to increase awareness, sensory and stretch to L UE with focus on wrist extension:  -sitting with elbow prop 5 reps with Min A  -attempted prone on elbows pt c/o increase pain to shoulder so deferred      Fine motor and wrist exercises:  -wrist flexion/extension at edge of chair 10 reps with Mod A to reach neutral   -quick stretch of digits for flexion/extension 3 reps 10x with assist and movement in flexion > extension    Pain: pain L shoulder with gentle ROM and weight bearing 6/10    Patient Education:  Patient educated on use of wrist cock up splint - pt plans to purchase on today.  Pt verbalized understanding of education.    Home Exercise Program: Pt provided with HEP consisting of AROM with cane, AAROM with assist from daughter for shoulder, elbow, forearm, wrist and hand and specific stretches and ROM for wrist and fingers.  Addition of supine extension stretches and progressing to snow angels supine.  Educated on technique to perform HEP.      Assessment: Pt with improving edema; pain today in shoulder and radiating with ROM and weight bearing tasks.  Pt advised to contact MD for follow up due to new and worsening shoulder pain.  Pt agreed.   Pt continues to require skilled OT intervention due to decreased strength, coordination and fine motor skills of L UE limiting her independence with ADL's, functional mobility and IADL's.    Goal Status:  Progressing    Plan:  Patient will benefit from continued skilled therapeutic intervention  to address the above functional goals - Continue plan of care with the following special instructions for next visit: continue with weight bearing tasks, addition of supine snow angels  Goals:  Short Term Goals: 6 weeks:   1. Pt will be independent in HEP consisting of proper positioning of L UE, edema management, ROM and fine motor exercises to increase functional movement and protect muscle integrity.   2. Pt will demo increase of active movement of L UE by at least 10* all planes in order to increase functional independence with ADL's and daily tasks.   3. Pt will be independent in don/doff and wearing schedule of [place] Stretch splint in order to decrease tone of L UE and  increase functional ROM.   4. Pt will don/doff shirt/jacket in <30 seconds after set-up with Mod I and no cues in order to increase independence and performance with ADL's.   Long Term Goals: 12 weeks:   1. Pt will demo increase of active movement of L UE by at least 20* all planes from initial evaluation in order to increase functional independence with ADL's and daily tasks.   2. Pt will increase L UE fine motor skills required for ADL's and daily tasks through ability to perform 9-hole peg test.   3. Pt will demo increase in functional use of L UE and ability to participate in ADL's and IADLs through increase stroke impact overall recovery scale to >50%.   4. Pt will demo increase in functional use of L UE through ability to demo self-feeding and grooming tasks in appropriate timeframe.   5. Pt will demo UB/LB dressing in sit/stand combo with use of hemi-technique and no cues for safety at Mod I level.      Luvenia Redden MS OTR/L  Occupational Therapist

## 2013-06-17 NOTE — Progress Notes (Signed)
[facility]   [address], [address]   [place], [place] [address]   Phone: [phone] Fax: [phone]   Speech Therapy Daily Note   PATIENT: Katherine [name] Referred By: Veneda Melter, MD   DOB: 10/31/1950 Medical Record #: [id number]   AGE: 63 y.o. Primary Diagnosis: CVA (cerebral infarction) [434.91]   Facility Provider #: [phone] HICN# Patient is not covered by Medicare.   Treatment Diagnosis: Executive Function/Memory Deficits 799.55; Cognitive-communication deficits 799.52   Visit #: SLP Visit   SLP Visit Number: 4   Visit Limit: 20 visits pt/ot/st combined   Certification Period: 05/26/13 to 11/23/13   Time of treatment:   Time Calculation   SLP Received On: 07/01/13   Start Time: 1400   Stop Time: 1500   Time Calculation (min): 60 min   Medications:   has a current medication list which includes the following prescription(s): atorvastatin, insulin aspart, insulin glargine, insulin syringe 1cc/29g, pramipexole, and rivaroxaban.   Patient/parent does not report changes to medication at this time.   Patient Goal: Patient Goal: To improve cognition, return to work and use a computer for return to work.   Therapy Goals: (Copied from Evaluation) Goal status updated today.   Short Term Goals: To be completed by 08/23/13:   1. Pt will complete Test of Memory and Learning x 100%. *Mastered   2. Pt will recall memory strategies x 100% I.   3. Pt will demonstrate use of memory strategies to organize a list of 20 words for recall x 80% with min cues.   Long Term Goals: To be completed by 11/23/13:   1. Pt will answer questions from visual stimulus of 3-5 minutes x 80% with min cues.   2. Pt will recall a list of 20 words x 90% with self-cues.   3. Pt will demonstrate immediate recall of location of 4-8 objects x 90% with self-cues.   Subjective:   Pain report:   [X]  [name] reported today.   [ ]  Pain level /10 today. Location:   Subjective patient report:    Pt's family insist that she continue to live with them and have concerns of her going home independently. She misses home and would love to move back.   Objective:   Objective Findings today:   STG 2: Recalled 2/5   STG 3, LTG 2: Completed 92% with review of strategies prior. *Each goal met x 1.   Pt able to complete NeuroPsych Online   Interventions Performed:   [ ]  SLP provided pt with tactile cues:   [ ]  SLP provided pt with visual cues   [ ]  SLP provided pt with verbal cues   [ ]  SLP provided pt with phonemic cues   [ ]  Handouts on:   [ ]  NeuroPsych Online: computer software focused on predetermined therapy protocol in which the patient is systematically presented with the tracks listed below in hierarchy contingent on completing prior to progression to higher level tasks.   [ ]  Attention Skills   [ ]  Executive Functioning Skills   [ ]  Memory Skills   [ ]  Visualspatial Skills   [ ]  Problem Solving Skills   [ ]  Communication Skills   [ ]    [X]  Other: Administration of TOMAL-2   Education/Home Exercise Program:   Patient was educated on deficits seen and goals established. Pt. demonstrated an understanding of this education.   [X]  No updates to  home program today   [ ]  HEP updated:   Assessment:   Pt's more challenging subtests in the the TOMAL-2 were the nonverbal visual memory tasks. Goals reflect these scores and work toward these deficits.   Patient presents with mild to moderate cognitive changes characterized by memory loss, difficulty with slow auditory comprehension and nonverbal memory tasks, difficulty with judgment and writing. Patient requires skilled therapy intervention to address Assessment: Impaired Memory, as well as the above mentioned deficits to maximize safety with daily activities and return to prior level of function.   Progress Toward Functional Goals: Met STG 1   Patient Requires Continued Skilled Care to focus on visual memory in order to increased patient safety and independence.    Plan: Continue with established plan of care with focus on memory strategies.   Treatment Code  # of Minutes    SLP Swallow Treatment [phone])     SLP Treatment Indiv [phone])     SLP Dev Cog Skills Ea ([id number])  Time Calculation (min): 60 min        Therapist Signature:   Cam Dauphin, MEd, Statistician Pathologist/Senior Therapist   [facility]   Pediatric and Adult Rehabilitation [email or web address]  06/17/13

## 2013-06-21 ENCOUNTER — Ambulatory Visit: Payer: Commercial Managed Care - POS

## 2013-06-21 ENCOUNTER — Ambulatory Visit: Payer: No Typology Code available for payment source

## 2013-06-23 ENCOUNTER — Ambulatory Visit: Payer: No Typology Code available for payment source

## 2013-06-23 ENCOUNTER — Ambulatory Visit: Payer: Commercial Managed Care - POS

## 2013-06-24 ENCOUNTER — Ambulatory Visit: Payer: Commercial Managed Care - POS

## 2013-06-28 ENCOUNTER — Ambulatory Visit: Payer: No Typology Code available for payment source

## 2013-06-28 ENCOUNTER — Ambulatory Visit: Payer: Commercial Managed Care - POS

## 2013-06-28 DIAGNOSIS — R29818 Other symptoms and signs involving the nervous system: Secondary | ICD-10-CM

## 2013-06-28 DIAGNOSIS — I6350 Cerebral infarction due to unspecified occlusion or stenosis of unspecified cerebral artery: Secondary | ICD-10-CM

## 2013-06-28 DIAGNOSIS — R29898 Other symptoms and signs involving the musculoskeletal system: Secondary | ICD-10-CM

## 2013-06-28 NOTE — Progress Notes (Signed)
[facility]  Pediatric and Adult Rehabilitation [facility]  [address]  [place] [address]  [phone]    Occupational Therapy Daily Note    PATIENT: Katherine [name]   Referred By: Veneda Melter, MD  DOB: 1950-12-02     Medical Record #: [id number]  AGE: 63 y.o.     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  Visit: 8    Precautions and Contraindications:  L hemi, fall risk, TIA, h/o DVT    Subjective: "I can't see the doctor for 3 weeks."    Objective: Pt presented after SLP session without quad cane; states she missed last weeks visit due to illness.  Pt states she went to make an appt with her MD for shoulder pain and could not get in for 3 weeks so cancelled it.  Educated pt to follow up with MD.  Pt states she has new insurance beginning and will look into coverage/year.    Therapeutic Activity:  UE arm bike with R UE wrapped and Min A x10 minutes forward and back for reciprocal UE movement; attempted L UE only but pt required Mod-Max A    Retrograde massage with lotion while pt supine to decrease edema of hand.     Neuromuscular Re-Education:   AAROM supine:  -shoulder flexion to about 95* due to tightness 15 reps; slow ROM  -shoulder abduction 15 reps with c/o pain at about 90* ROM  -elbow flexion/extension 15 reps  -forearm supination/pronation 20 reps  -wrist extension with extended stretch held to decrease tone 10 reps  -finger extension all digits with stretch held  -scapular squeeze 15 reps  -scapular mobilizations all patterns with resistance while pt side lying  -scapular squeeze with minimal resistance 10 reps    Weight bearing to increase awareness, sensory and stretch to L UE with focus on wrist  extension:  -supine on elbow propped static hold and scapular squeeze x30 reps    Kinesiotaping to back of hand with anchor towards axilla in fan pattern at back of digits.  Addition of wrist extensor facilitation taping with anchor at back of hand and forearm and 50% tension.  Pt with no discomfort at this time.    Pain: pain L shoulder with gentle ROM and weight bearing 6/10    Patient Education:  Patient educated on use of wrist cock up splint - pt plans to purchase on today.  Educated on kinesiotaping: pt should not feel pain, to remove if any redness/irritation/pain/blisters. Pt can shower with taping, no heat applied to surface.  Pt should remove if any discomfort.  To wear for 2 days until next session only.  Pt verbalized understanding of education.    Home Exercise Program: Pt to focus on gentle stretch of UE and scapular strengthening as listed above. Pt is to defer cane and AROM exercises at this time due to shoulder pain.  Pt educated to monitor her functional movements and avoid movements in which she hikes her shoulder.  Educated on technique to perform HEP.      Assessment: Pt with significant improving edema of L hand; continues with pain today in shoulder and radiating with ROM and weight bearing tasks; educated on modifications of  tasks and awareness.  Focused on scapular strengthening today to assist with muscle equalizing and decreasing of shoulder pain.  Pt advised to contact MD for follow up due to new and worsening shoulder pain.  Pt agreed.   Pt continues to require skilled OT intervention due to decreased strength, coordination and fine motor skills of L UE limiting her independence with ADL's, functional mobility and IADL's.    Goal Status:  Progressing    Plan:  Patient will benefit from continued skilled therapeutic intervention to address the above functional goals - Continue plan of care with the following special instructions for next visit: continue with weight bearing tasks, addition  of supine snow angels  Goals:  Short Term Goals: 6 weeks:   1. Pt will be independent in HEP consisting of proper positioning of L UE, edema management, ROM and fine motor exercises to increase functional movement and protect muscle integrity.   2. Pt will demo increase of active movement of L UE by at least 10* all planes in order to increase functional independence with ADL's and daily tasks.   3. Pt will be independent in don/doff and wearing schedule of Saebo Stretch splint in order to decrease tone of L UE and increase functional ROM.   4. Pt will don/doff shirt/jacket in <30 seconds after set-up with Mod I and no cues in order to increase independence and performance with ADL's.   Long Term Goals: 12 weeks:   1. Pt will demo increase of active movement of L UE by at least 20* all planes from initial evaluation in order to increase functional independence with ADL's and daily tasks.   2. Pt will increase L UE fine motor skills required for ADL's and daily tasks through ability to perform 9-hole peg test.   3. Pt will demo increase in functional use of L UE and ability to participate in ADL's and IADLs through increase stroke impact overall recovery scale to >50%.   4. Pt will demo increase in functional use of L UE through ability to demo self-feeding and grooming tasks in appropriate timeframe.   5. Pt will demo UB/LB dressing in sit/stand combo with use of hemi-technique and no cues for safety at Mod I level.      Luvenia Redden MS OTR/L  Occupational Therapist

## 2013-06-30 ENCOUNTER — Ambulatory Visit: Payer: Commercial Managed Care - POS

## 2013-06-30 ENCOUNTER — Ambulatory Visit: Payer: No Typology Code available for payment source

## 2013-07-01 ENCOUNTER — Ambulatory Visit: Payer: No Typology Code available for payment source

## 2013-07-01 DIAGNOSIS — I6350 Cerebral infarction due to unspecified occlusion or stenosis of unspecified cerebral artery: Secondary | ICD-10-CM

## 2013-07-01 DIAGNOSIS — G2581 Restless legs syndrome: Secondary | ICD-10-CM | POA: Insufficient documentation

## 2013-07-01 DIAGNOSIS — Z8673 Personal history of transient ischemic attack (TIA), and cerebral infarction without residual deficits: Secondary | ICD-10-CM | POA: Insufficient documentation

## 2013-07-01 DIAGNOSIS — R41841 Cognitive communication deficit: Secondary | ICD-10-CM

## 2013-07-01 DIAGNOSIS — R41844 Frontal lobe and executive function deficit: Secondary | ICD-10-CM

## 2013-07-01 DIAGNOSIS — Z86718 Personal history of other venous thrombosis and embolism: Secondary | ICD-10-CM | POA: Insufficient documentation

## 2013-07-01 DIAGNOSIS — R29818 Other symptoms and signs involving the nervous system: Secondary | ICD-10-CM | POA: Insufficient documentation

## 2013-07-01 DIAGNOSIS — R279 Unspecified lack of coordination: Secondary | ICD-10-CM

## 2013-07-01 DIAGNOSIS — R29898 Other symptoms and signs involving the musculoskeletal system: Secondary | ICD-10-CM | POA: Insufficient documentation

## 2013-07-01 DIAGNOSIS — M6281 Muscle weakness (generalized): Secondary | ICD-10-CM

## 2013-07-01 NOTE — Progress Notes (Signed)
[facility]  [address]  [place], [place]  [address]  Phone:  [phone]  Fax:  [phone]    Speech Therapy Daily Note    PATIENT: Katherine [name]    Referred By: Veneda Melter, MD  DOB:   21-Feb-1951                                 Medical Record #: [id number]  AGE:   63 y.o.                          Primary Diagnosis: CVA (cerebral infarction) [434.91]  Facility Provider #: [phone]   HICN# Patient is not covered by Medicare.    Treatment Diagnosis:  Executive Function/Memory Deficits 799.55; Cognitive-communication deficits 799.52    Visit #: SLP Visit  SLP Visit Number: 4  Visit Limit:  20 visits pt/ot/st combined    Certification Period: 05/26/13 to 11/23/13     Time of treatment:   Time Calculation  SLP Received On: 07/01/13  Start Time: 1400  Stop Time: 1500  Time Calculation (min): 60 min    Medications:  has a current medication list which includes the following prescription(s): atorvastatin, insulin aspart, insulin glargine, insulin syringe 1cc/29g, pramipexole, and rivaroxaban.    Patient/parent does not report changes to medication at this time.    Patient Goal:  Patient Goal: To improve cognition, return to work and use a computer for return to work.    Therapy Goals:  (Copied from Evaluation)  Goal status updated today.    Short Term Goals:   To be completed by 08/23/13:  1.  Pt will complete Test of Memory and Learning x 100%. *Mastered  2.  Pt will recall memory strategies x 100% I.  3.  Pt will demonstrate use of memory strategies to organize a list of 20 words for recall x 80% with min cues.    Long Term Goals:  To be completed by 11/23/13:  1.  Pt will answer questions from visual stimulus of 3-5 minutes x 80% with min cues.   2.  Pt will recall a list of 20 words x 90% with self-cues.  3.  Pt will demonstrate immediate recall of location of 4-8 objects x 90% with self-cues.    Subjective:   Pain report:  [x]   [name]  reported today.  []   Pain level  /10 today.  Location:      Subjective patient report:  Pt's family insist that she continue to live with them and have concerns of her going home independently. She misses home and would love to move back.    Objective:  Objective Findings today:   STG 2: Recalled 2/5  STG 3, LTG 2: Completed 92% with review of strategies prior. *Each goal met x 1.  Pt able to complete [employer]       Interventions Performed:   []  SLP provided pt with tactile cues:   []  SLP provided pt with visual cues   []  SLP provided pt with verbal cues   []  SLP provided pt with phonemic cues   []  Handouts on:   []  [employer] Online: computer software focused on predetermined therapy protocol in which the patient is systematically presented with the tracks listed below in hierarchy contingent on completing prior to progression to higher level tasks.   []   Attention Skills   []  Executive Functioning Skills   []  Memory Skills   []  Visualspatial Skills   []  Problem Solving Skills   []  Communication Skills  []    [x]  Other: Administration of TOMAL-2    Education/Home Exercise Program:    Patient was educated on deficits seen and goals established.  Pt. demonstrated an understanding of this education.    [x]   No updates to home program today    []   HEP updated:    Assessment:   Pt's more challenging subtests in the the TOMAL-2 were the nonverbal visual memory tasks.  Goals reflect these scores and work toward these deficits.   Patient presents with mild to moderate cognitive changes characterized by memory loss, difficulty with slow auditory comprehension and nonverbal memory tasks, difficulty with judgment and writing. Patient requires skilled therapy intervention to address Assessment: Impaired Memory, as well as the above mentioned deficits to maximize safety with daily activities and return to prior level of function.  Progress Toward Functional Goals:  Met  STG 1    Patient Requires Continued Skilled Care to  focus on visual memory in order to increased patient safety and independence.    Plan: Continue with established plan of care with focus on memory strategies.      Treatment Code # of Minutes   SLP Swallow Treatment [phone])    SLP Treatment Indiv [phone])    SLP Dev Cog Skills Ea (09811) Time Calculation (min): 60 min         Therapist Signature:   Vannessa Godown, MEd, Information systems manager Pathologist/Senior Therapist  [facility]  Pediatric and Adult Rehabilitation [email or web address]  07/01/2013

## 2013-07-01 NOTE — Progress Notes (Signed)
[facility]  Pediatric and Adult Rehabilitation [facility]  [address]  [place] [address]  [phone]    Occupational Therapy Daily Note    PATIENT: Katherine [name]   Referred By: Veneda Melter, MD  DOB: 1950-11-04     Medical Record #: [id number]  AGE: 63 y.o.     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  Visit: 9    Precautions and Contraindications:  L hemi, fall risk, TIA, h/o DVT    Subjective: "The tape is amazing!"    Objective: Pt presented after SLP session.  Pt states the kinesiotape has been wonderful in improving edema of her hand, she had less spasms at night and she was able to move her wrist and extend her 2nd digit in the past two days!  Pt presented with tape on and reports no discomfort.  Reports decreased shoulder pain as compared to last session.  Insurance barriers to [employer] splint.    Therapeutic Activity:  UE arm bike with R UE wrapped and Min A x10 minutes forward and back for reciprocal UE movement; attempted L UE only but pt required Mod-Max A    Retrograde massage with lotion while pt supine to decrease edema of hand.     Neuromuscular Re-Education:   AAROM supine:  -shoulder flexion to about 95* due to tightness 15 reps; slow ROM  -shoulder abduction 15 reps with c/o pain at about 90* ROM  -elbow flexion/extension 15 reps  -forearm supination/pronation 20 reps  -wrist extension with extended stretch held to decrease tone 10 reps  -finger extension all digits with stretch held  -scapular squeeze 15 reps  -scapular mobilizations all patterns with resistance while pt side lying  -scapular squeeze with minimal resistance 10 reps  -scapular protraction/retraction while side lying 10  reps with min resistance    Weight bearing to increase awareness, sensory and stretch to L UE with focus on wrist extension:  -supine on elbow propped static hold   -scapular squeeze x30 reps  -shifting R/L 10 reps with difficulty    Wrist and hand ROM: with UE off EOM  -wrist flexion to neutral pt able to perform 3 before fatigue  -finger flexion after quick stretch to finger extension; pt able to extend 2nd digit consistently    Kinesiotaping applied similar to last session to back of hand with anchor towards axilla in fan pattern at back of digits.  Addition of wrist extensor facilitation taping with anchor at back of hand and forearm and 50% tension.  Pt with no discomfort at this time.    Pain: pain L shoulder with gentle ROM and weight bearing 6/10    Patient Education:  Patient educated on use of wrist cock up splint - pt plans to purchase on today.  Educated on kinesiotaping: pt should not feel pain, to remove if any redness/irritation/pain/blisters. Pt can shower with taping, no heat applied to surface.  Pt should remove if any discomfort.  To wear for 2 days until next session only.  Pt verbalized understanding of education.    Home Exercise Program: Pt to focus on gentle stretch of UE and scapular strengthening as listed above. Pt is to defer cane and  AROM exercises at this time due to shoulder pain.  Pt educated to monitor her functional movements and avoid movements in which she hikes her shoulder.  Educated on technique to perform HEP.      Assessment: Pt with significant improvement of edema since last session and improvement in wrist and finger movement today.  Continues with pain today in shoulder and radiating with ROM and weight bearing tasks, but improved since last session.  Pt continues to require skilled OT intervention due to decreased strength, coordination and fine motor skills of L UE limiting her independence with ADL's, functional mobility and IADL's.    Goal Status:   Progressing    Plan:  Patient will benefit from continued skilled therapeutic intervention to address the above functional goals - Continue plan of care with the following special instructions for next visit: continue with weight bearing tasks, addition of supine snow angels  Goals:  Short Term Goals: 6 weeks:   1. Pt will be independent in HEP consisting of proper positioning of L UE, edema management, ROM and fine motor exercises to increase functional movement and protect muscle integrity.   2. Pt will demo increase of active movement of L UE by at least 10* all planes in order to increase functional independence with ADL's and daily tasks.   3. Pt will be independent in don/doff and wearing schedule of Saebo Stretch splint in order to decrease tone of L UE and increase functional ROM.   4. Pt will don/doff shirt/jacket in <30 seconds after set-up with Mod I and no cues in order to increase independence and performance with ADL's.   Long Term Goals: 12 weeks:   1. Pt will demo increase of active movement of L UE by at least 20* all planes from initial evaluation in order to increase functional independence with ADL's and daily tasks.   2. Pt will increase L UE fine motor skills required for ADL's and daily tasks through ability to perform 9-hole peg test.   3. Pt will demo increase in functional use of L UE and ability to participate in ADL's and IADLs through increase stroke impact overall recovery scale to >50%.   4. Pt will demo increase in functional use of L UE through ability to demo self-feeding and grooming tasks in appropriate timeframe.   5. Pt will demo UB/LB dressing in sit/stand combo with use of hemi-technique and no cues for safety at Mod I level.      Luvenia Redden MS OTR/L  Occupational Therapist

## 2013-07-05 ENCOUNTER — Ambulatory Visit: Payer: No Typology Code available for payment source

## 2013-07-05 DIAGNOSIS — R29898 Other symptoms and signs involving the musculoskeletal system: Secondary | ICD-10-CM

## 2013-07-05 DIAGNOSIS — M6281 Muscle weakness (generalized): Secondary | ICD-10-CM

## 2013-07-05 DIAGNOSIS — I6350 Cerebral infarction due to unspecified occlusion or stenosis of unspecified cerebral artery: Secondary | ICD-10-CM

## 2013-07-05 NOTE — Progress Notes (Signed)
[facility]  Pediatric and Adult Rehabilitation [facility]  [address]  [place] [address]  [phone]    Occupational Therapy Daily Note    PATIENT: Katherine [name]   Referred By: Veneda Melter, MD  DOB: April 07, 1950     Medical Record #: [id number]  AGE: 63 y.o.    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  Visit: 10    Precautions and Contraindications:  L hemi, fall risk, TIA, h/o DVT    Subjective: "My spasms were worse over the weekend, my arm hurts a lot."    Objective: Pt presented alone to session.  Pt states she wore kinesiotape x4 days, but her flexion spasms worsened at night and pain in her arm below shoulder.  Encouraged pt to call neurologist and set up an appointment.  Pt has script for flexeril but has not been taking medication.  Therapist is working with Mellissa Kohut to order stretch splint.        Therapeutic Activity:  UE arm bike with R UE wrapped and Min A x10 minutes forward and back for reciprocal UE movement; attempted L UE only but pt required Mod-Max A    Retrograde massage with lotion while pt supine to decrease edema of hand while moist heat applied to UE, shoulder and neck to decrease pain.     Neuromuscular Re-Education:   AAROM supine:  -shoulder flexion to about 95* due to tightness 15 reps; slow ROM  -shoulder abduction 15 reps with c/o pain at about 90* ROM  -elbow flexion/extension 15 reps  -forearm supination/pronation 20 reps  -wrist extension with extended stretch held to decrease tone 10 reps  -finger extension all digits with stretch held  All stretches performed slow and held for static hold    Kinesiotaping applied similar to last session to back of hand with anchor towards  axilla in fan pattern at back of digits; addition of thumb digit today.  Addition of wrist extensor facilitation taping with anchor at back of hand and forearm and 50% tension.  Pt with no discomfort at this time.    Pain: pain L shoulder with gentle ROM and weight bearing 6/10    Patient Education:  Patient educated on use of wrist cock up splint - pt plans to purchase on today.  Educated on kinesiotaping: pt should not feel pain, to remove if any redness/irritation/pain/blisters. Pt can shower with taping, no heat applied to surface.  Pt should remove if any discomfort.  To wear for 2 days until next session only.  Pt verbalized understanding of education.    Home Exercise Program: Pt to focus on gentle stretch of UE and scapular strengthening. Pt is to defer cane and AROM exercises at this time due to shoulder pain.  Pt educated to monitor her functional movements and avoid movements in which she hikes her shoulder.  Educated on technique to perform HEP.      Assessment: Pt continues with significant improvement of edema since last session, but increased pain and flexion spasms since last session.  Pt encouraged to contact her MD due to ongoing pain.  Session focused on modalities and gentle stretching to UE to decrease pain; pt did have less pain and  discomfort at end of session.  Pt continues to require skilled OT intervention due to decreased strength, coordination and fine motor skills of L UE limiting her independence with ADL's, functional mobility and IADL's.    Goal Status:  Progressing    Plan:  Patient will benefit from continued skilled therapeutic intervention to address the above functional goals - Continue plan of care with the following special instructions for next visit: continue with weight bearing tasks, addition of supine snow angels  Goals:  Short Term Goals: 6 weeks:   1. Pt will be independent in HEP consisting of proper positioning of L UE, edema management, ROM and fine motor exercises to  increase functional movement and protect muscle integrity.   2. Pt will demo increase of active movement of L UE by at least 10* all planes in order to increase functional independence with ADL's and daily tasks.   3. Pt will be independent in don/doff and wearing schedule of Saebo Stretch splint in order to decrease tone of L UE and increase functional ROM.   4. Pt will don/doff shirt/jacket in <30 seconds after set-up with Mod I and no cues in order to increase independence and performance with ADL's.   Long Term Goals: 12 weeks:   1. Pt will demo increase of active movement of L UE by at least 20* all planes from initial evaluation in order to increase functional independence with ADL's and daily tasks.   2. Pt will increase L UE fine motor skills required for ADL's and daily tasks through ability to perform 9-hole peg test.   3. Pt will demo increase in functional use of L UE and ability to participate in ADL's and IADLs through increase stroke impact overall recovery scale to >50%.   4. Pt will demo increase in functional use of L UE through ability to demo self-feeding and grooming tasks in appropriate timeframe.   5. Pt will demo UB/LB dressing in sit/stand combo with use of hemi-technique and no cues for safety at Mod I level.      Luvenia Redden MS OTR/L  Occupational Therapist

## 2013-07-07 ENCOUNTER — Ambulatory Visit: Payer: No Typology Code available for payment source

## 2013-07-08 ENCOUNTER — Ambulatory Visit: Payer: No Typology Code available for payment source

## 2013-07-08 DIAGNOSIS — I6350 Cerebral infarction due to unspecified occlusion or stenosis of unspecified cerebral artery: Secondary | ICD-10-CM

## 2013-07-08 DIAGNOSIS — R41841 Cognitive communication deficit: Secondary | ICD-10-CM

## 2013-07-08 DIAGNOSIS — R29898 Other symptoms and signs involving the musculoskeletal system: Secondary | ICD-10-CM

## 2013-07-08 NOTE — Progress Notes (Signed)
[facility]  Pediatric and Adult Rehabilitation [facility]  [address] [place] [address]  [place] [address]  [phone]    Occupational Therapy Daily Note    PATIENT: Katherine [name]   Referred By: Veneda Melter, MD  DOB: 01/01/1951     Medical Record #: [id number]  AGE: 63 y.o.    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  Visit: 11    Precautions and Contraindications:  L hemi, fall risk, TIA, h/o DVT    Subjective: "My spasms are still bad and my whole arm hurts."    Objective: Pt presented alone to session.  Pt states she just removed kinesiotape, but her flexion spasms have continued to be unbearable at night and when her arm is at rest and pain in her arm below shoulder.  Noted increased edema of hand today.  Pt stated she called MD and was unable to make appt.  This therapist called MD office to explain situation and pt has an appt this Monday to see MD.  Therapist is working with Mellissa Kohut to order stretch splint.        Therapeutic Activity:  UE arm bike with R UE wrapped and Min A x10 minutes forward and back for reciprocal UE movement; attempted L UE only but pt required Mod-Max A    Retrograde massage with lotion while pt supine to decrease edema of hand while moist heat applied to UE, shoulder and neck to decrease pain while moist heat applied x10 minutes to shoulder.     Neuromuscular Re-Education:   Pt with pain and tightness L shoulder began session with focus on scapula to decrease tightness:  -scapular mobs elevation/depression, protraction/retraction and D1/D2 patterns  Pt with initial c/o discomfort then felt better    AAROM supine:  -shoulder flexion to about 95* due to tightness 15 reps; slow ROM; pain with end  of movement  -shoulder abduction 15 reps with c/o pain at about 90* ROM  -elbow flexion/extension 15 reps  -forearm supination/pronation 20 reps  -wrist extension with extended stretch held to decrease tone 10 reps  -finger extension all digits with stretch held  All stretches performed slow and held for static hold  -scapular squeeze 15 reps  -scapular protraction/retraction 15 reps    Wrist flexion/extension while seated EOM: pt able to perform 3 reps to wrist extension then fatigued  Finger/flexion extension after quick stretch; active finger extension noted  Weight bearing on elbow prop and on hand with wrist and fingers extended 5 reps each    Kinesiotaping applied similar to last session to back of hand with anchor towards axilla in fan pattern at back of digits.  Addition of wrist extensor facilitation taping with anchor at back of hand and forearm and 50% tension.  Pt with no discomfort at this time.    Pain: pain L shoulder with gentle ROM and weight bearing 6/10    Patient Education:  Patient educated on use of wrist cock up splint - pt plans to purchase on today.  Educated on kinesiotaping: pt should not feel pain, to remove if any redness/irritation/pain/blisters. Pt can shower with taping, no heat applied to surface.  Pt should remove if any discomfort.  To wear for  3 days.  Pt verbalized understanding of education.    Home Exercise Program: Pt to focus on gentle stretch of UE and scapular strengthening. Pt is to defer cane and AROM exercises at this time due to shoulder pain.  Pt educated to monitor her functional movements and avoid movements in which she hikes her shoulder.  Educated on technique to perform HEP.      Assessment: Pt continues with increased pain and flexion spasms and slight increase in edema of hand since last session.  Pt has an appointment scheduled with her MD on Monday.  Session focused on modalities and gentle stretching to UE to decrease pain; pt did have less pain and  discomfort at end of session.  Pt continues to require skilled OT intervention due to decreased strength, coordination and fine motor skills of L UE limiting her independence with ADL's, functional mobility and IADL's.    Goal Status:  Progressing    Plan:  Patient will benefit from continued skilled therapeutic intervention to address the above functional goals - Continue plan of care with the following special instructions for next visit: continue with weight bearing tasks, addition of supine snow angels  Goals:  Short Term Goals: 6 weeks:   1. Pt will be independent in HEP consisting of proper positioning of L UE, edema management, ROM and fine motor exercises to increase functional movement and protect muscle integrity.   2. Pt will demo increase of active movement of L UE by at least 10* all planes in order to increase functional independence with ADL's and daily tasks.   3. Pt will be independent in don/doff and wearing schedule of Saebo Stretch splint in order to decrease tone of L UE and increase functional ROM.   4. Pt will don/doff shirt/jacket in <30 seconds after set-up with Mod I and no cues in order to increase independence and performance with ADL's.   Long Term Goals: 12 weeks:   1. Pt will demo increase of active movement of L UE by at least 20* all planes from initial evaluation in order to increase functional independence with ADL's and daily tasks.   2. Pt will increase L UE fine motor skills required for ADL's and daily tasks through ability to perform 9-hole peg test.   3. Pt will demo increase in functional use of L UE and ability to participate in ADL's and IADLs through increase stroke impact overall recovery scale to >50%.   4. Pt will demo increase in functional use of L UE through ability to demo self-feeding and grooming tasks in appropriate timeframe.   5. Pt will demo UB/LB dressing in sit/stand combo with use of hemi-technique and no cues for safety at Mod I level.      Luvenia Redden MS OTR/L  Occupational Therapist

## 2013-07-08 NOTE — Progress Notes (Signed)
[facility]  [address], [address]  [place], [place]  [address]  Phone:  [phone]  Fax:  [phone]    Speech Therapy Daily Note    PATIENT: Katherine [name]    Referred By: Veneda Melter, MD  DOB:   04-25-1950                                 Medical Record #: [id number]  AGE:   63 y.o.                          Primary Diagnosis: CVA (cerebral infarction) [434.91]  Facility Provider #: [phone]   HICN# Patient is not covered by Medicare.    Treatment Diagnosis:  Executive Function/Memory Deficits 799.55    Visit #: SLP Visit  SLP Visit Number: 3  Visit Limit:  20 visits pt/ot/st combined    Certification Period: 05/26/13 to 11/23/13     Time of treatment:   Time Calculation  SLP Received On: 07/08/2013  Start Time: 1400  Stop Time: 1500  Time Calculation (min): 60 min    Medications:  has a current medication list which includes the following prescription(s): atorvastatin, insulin aspart, insulin glargine, insulin syringe 1cc/29g, pramipexole, and rivaroxaban.    Patient/parent does not report changes to medication at this time.    Patient Goal:  Patient Goal: To improve cognition, return to work and use a computer for return to work.    Therapy Goals:  (Copied from Evaluation)  Goal status updated today.    Short Term Goals:   To be completed by 08/23/13:  1.  Pt will complete Test of Memory and Learning x 100%. *Mastered  2.  Pt will recall memory strategies x 100% I.  3.  Pt will demonstrate use of memory strategies to organize a list of 20 words for recall x 80% with min cues.  4. Added 07/08/13: Pt will complete convergent naming activities with concrete and abstract items x 90% I.   5. Added 07/08/13: Pt will complete executive function tasks of reasoning, decision making and self-correction x 90% I using requirements per Neuro[employer] Online.       Long Term Goals:  To be completed by 11/23/13:  1.  Pt will answer questions from visual stimulus of 3-5  minutes x 80% with min cues.   2.  Pt will recall a list of 20 words x 90% with self-cues.  3.  Pt will demonstrate immediate recall of location of 4-8 objects x 90% with self-cues.    Subjective:   Pain report:  [x]   [name] reported today.  []   Pain level  /10 today.  Location:      Subjective patient report:  Pt's family insist that she continue to live with them and have concerns of her going home independently. She misses home and would love to move back.    Objective:  Objective Findings today:   STG 4 added today: Baseline 70% I.  STG 5 added today: Baseline 50% I and 75% with mod verbal cues.  STG 2: Met with min cues.  STG 3: Met x 80% with min cues.      Interventions Performed:   []  SLP provided pt with tactile cues:   [x]  SLP provided pt with visual cues: min   [x]  SLP provided pt with  verbal cues: min  []  SLP provided pt with phonemic cues   []  Handouts on:   [x]  NeuroPsych Online: computer software focused on predetermined therapy protocol in which the patient is systematically presented with the tracks listed below in hierarchy contingent on completing prior to progression to higher level tasks.   []  Attention Skills   [x]  Executive Functioning Skills   []  Memory Skills   []  Visualspatial Skills   []  Problem Solving Skills   []  Communication Skills  []    []  Other:     Education/Home Exercise Program:    Patient was educated on deficits seen and goals established.  Pt. demonstrated an understanding of this education.    []   No updates to home program today    [x]   HEP updated:  Organize and list items in 10 concrete categories    Assessment:   Pt continues to progress in goals with SLP instruction with she implements well.  Patient presents with mild to moderate cognitive changes characterized by memory loss, difficulty with slow auditory comprehension and nonverbal memory tasks, difficulty with judgment and writing. Patient requires skilled therapy intervention to address Assessment: Impaired Memory,  as well as the above mentioned deficits to maximize safety with daily activities and return to prior level of function.  Progress Toward Functional Goals:  Met  STG 2, 3 - Goals added    Patient Requires Continued Skilled Care to focus on visual memory in order to increased patient safety and independence.    Plan: Continue with established plan of care with focus on naming.      Treatment Code # of Minutes   SLP Swallow Treatment [phone])    SLP Treatment Indiv [phone])    SLP Dev Cog Skills Ea (09811) Time Calculation (min): 60 min         Therapist Signature:   Nell Gales, MEd, Information systems manager Pathologist/Senior Therapist  [facility]  Pediatric and Adult Rehabilitation [email or web address]  07/08/2013

## 2013-07-12 ENCOUNTER — Ambulatory Visit: Payer: No Typology Code available for payment source

## 2013-07-12 DIAGNOSIS — R29898 Other symptoms and signs involving the musculoskeletal system: Secondary | ICD-10-CM

## 2013-07-12 DIAGNOSIS — I6350 Cerebral infarction due to unspecified occlusion or stenosis of unspecified cerebral artery: Secondary | ICD-10-CM

## 2013-07-13 NOTE — Progress Notes (Signed)
[facility]  Pediatric and Adult Rehabilitation [facility]  [address] [place] [address]  [place] [address]  [phone]    Occupational Therapy Daily Note    PATIENT: Katherine [name]   Referred By: Veneda Melter, MD  DOB: March 26, 1951     Medical Record #: [id number]  AGE: 63 y.o.    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  Visit: 12    Precautions and Contraindications:  L hemi, fall risk, TIA, h/o DVT    Subjective: "My spasms are still bad but I saw the doctor this morning."    Objective: Pt presented alone to session.  Pt states she saw her MD this morning and he has prescribed a muscle relaxer but she does not recall the name.  Pt will be taking each evening.  Pt states she continues with muscle spasms.  Pt presented with edema glove due to removing tape previous day.  Therapist is working with Mellissa Kohut to order stretch splint.        Therapeutic Activity:  UE arm bike with R UE wrapped and Min A x10 minutes forward and back for reciprocal UE movement; attempted L UE only but pt required Mod-Max A    Retrograde massage with lotion while pt supine to decrease edema of hand while moist heat applied to UE, shoulder and neck to decrease pain while moist heat applied x10 minutes to shoulder.     Neuromuscular Re-Education:   Began session with focus on scapula to decrease tightness:  -scapular mobs elevation/depression, protraction/retraction and D1/D2 patterns  -scapular squeeze 15 reps  -scapular protraction/retraction 15 reps side lying    AAROM supine:  -shoulder flexion to about 95* due to tightness 15 reps; slow ROM; pain with end of movement  -shoulder abduction 15 reps with c/o pain at about 90* ROM  -elbow  flexion/extension 15 reps  -forearm supination/pronation 20 reps  -wrist extension with extended stretch held to decrease tone 10 reps  -finger extension all digits with stretch held  -weight bearing side sitting with UE and wrist in extension  -weight bearing side lying on elbow prop with Mod A to position and maintain  All stretches performed slow and held for static hold    Wrist flexion/extension while seated EOM: pt able to perform 4 reps to wrist extension then fatigued  Finger/flexion extension after quick stretch; active finger extension noted of 2nd digit    Kinesiotaping applied similar to last session to back of hand with anchor towards axilla in fan pattern at back of digits.  Addition of wrist extensor facilitation taping with anchor at back of hand and forearm and 50% tension.  Pt with no discomfort at this time.    Pain: pain L shoulder with gentle ROM and weight bearing 4/10    Patient Education:  Patient educated on use of wrist cock up splint - pt plans to purchase on today.  Educated on kinesiotaping: pt should not feel pain, to remove if any redness/irritation/pain/blisters. Pt can shower with taping, no heat applied to surface.  Pt should remove if any discomfort.  To wear for 3 days.  Pt verbalized understanding of education.    Home Exercise Program: Pt to focus on gentle stretch  of UE and scapular strengthening. Pt is to defer cane and AROM exercises at this time due to shoulder pain.  Pt educated to monitor her functional movements and avoid movements in which she hikes her shoulder.  Educated on technique to perform HEP.      Assessment: Pt continues with increased pain and flexion spasms and slight increase in edema of hand since last session; to begin taking a muscle relaxer per MD.   Session focused on gentle ROM and scapular strengthening until pain improves.  Pt continues to require skilled OT intervention due to decreased strength, coordination and fine motor skills of L UE limiting  her independence with ADL's, functional mobility and IADL's.    Goal Status:  Progressing    Plan:  Patient will benefit from continued skilled therapeutic intervention to address the above functional goals - Continue plan of care with the following special instructions for next visit: continue with weight bearing tasks, addition of supine snow angels  Goals:  Short Term Goals: 6 weeks:   1. Pt will be independent in HEP consisting of proper positioning of L UE, edema management, ROM and fine motor exercises to increase functional movement and protect muscle integrity.   2. Pt will demo increase of active movement of L UE by at least 10* all planes in order to increase functional independence with ADL's and daily tasks.   3. Pt will be independent in don/doff and wearing schedule of Saebo Stretch splint in order to decrease tone of L UE and increase functional ROM.   4. Pt will don/doff shirt/jacket in <30 seconds after set-up with Mod I and no cues in order to increase independence and performance with ADL's.   Long Term Goals: 12 weeks:   1. Pt will demo increase of active movement of L UE by at least 20* all planes from initial evaluation in order to increase functional independence with ADL's and daily tasks.   2. Pt will increase L UE fine motor skills required for ADL's and daily tasks through ability to perform 9-hole peg test.   3. Pt will demo increase in functional use of L UE and ability to participate in ADL's and IADLs through increase stroke impact overall recovery scale to >50%.   4. Pt will demo increase in functional use of L UE through ability to demo self-feeding and grooming tasks in appropriate timeframe.   5. Pt will demo UB/LB dressing in sit/stand combo with use of hemi-technique and no cues for safety at Mod I level.      Luvenia Redden MS OTR/L  Occupational Therapist

## 2013-07-14 ENCOUNTER — Ambulatory Visit: Payer: No Typology Code available for payment source

## 2013-07-15 ENCOUNTER — Ambulatory Visit: Payer: No Typology Code available for payment source

## 2013-07-15 DIAGNOSIS — R29898 Other symptoms and signs involving the musculoskeletal system: Secondary | ICD-10-CM

## 2013-07-15 DIAGNOSIS — R41841 Cognitive communication deficit: Secondary | ICD-10-CM

## 2013-07-15 DIAGNOSIS — I639 Cerebral infarction, unspecified: Secondary | ICD-10-CM

## 2013-07-15 DIAGNOSIS — I6350 Cerebral infarction due to unspecified occlusion or stenosis of unspecified cerebral artery: Secondary | ICD-10-CM

## 2013-07-15 NOTE — Progress Notes (Signed)
[facility]  [address]  [place], [place]  [address]  Phone:  [phone]  Fax:  [phone]    Speech Therapy Daily Note    PATIENT: Katherine [name]    Referred By: Veneda Melter, MD  DOB:   1950-11-25                                 Medical Record #: [id number]  AGE:   63 y.o.                          Primary Diagnosis: CVA (cerebral infarction) [434.91]  Facility Provider #: [phone]   HICN# Patient is not covered by Medicare.    Treatment Diagnosis:  Executive Function/Memory Deficits 799.55    Visit #: SLP Visit  SLP Visit Number: 6  Visit Limit:  20 visits pt/ot/st combined    Certification Period: 05/26/13 to 11/23/13     Time of treatment:   Time Calculation  SLP Received On: 07/15/2013  Start Time: 1400  Stop Time: 1500  Time Calculation (min): 60 min    Medications:  has a current medication list which includes the following prescription(s): atorvastatin, insulin aspart, insulin glargine, insulin syringe 1cc/29g, pramipexole, and rivaroxaban.    Patient/parent does not report changes to medication at this time.    Patient Goal:  Patient Goal: To improve cognition, return to work and use a computer for return to work.    Therapy Goals:  (Copied from Evaluation)  Goal status updated today.    Short Term Goals:   To be completed by 08/23/13:  1.  Pt will complete Test of Memory and Learning x 100%. *Mastered  2.  Pt will recall memory strategies x 100% I.  *Met x 1  3.  Pt will demonstrate use of memory strategies to organize a list of 20 words for recall x 80% with min cues.  *Met x 1  4. Added 07/08/13: Pt will complete convergent naming activities with concrete and abstract items x 90% I.   5. Added 07/08/13: Pt will complete executive function tasks of reasoning, decision making and self-correction x 90% I using requirements per [employer] Online.     Long Term Goals:  To be completed by 11/23/13:  1.  Pt will answer questions from  visual stimulus of 3-5 minutes x 80% with min cues.   2.  Pt will recall a list of 20 words x 90% with self-cues.  3.  Pt will demonstrate immediate recall of location of 4-8 objects x 90% with self-cues.    Subjective:   Pain report:  [x]   [name] reported today.  []   Pain level  /10 today.  Location:      Subjective patient report:  Pt reports completion of homework, although, does not have with her today.    Objective:  Objective Findings today:   STG 2: Met x 2  STG 3: Met x 2  LTG 2: Met x 1      Interventions Performed:   []  SLP provided pt with tactile cues:   []  SLP provided pt with visual cues   []  SLP provided pt with verbal cues   []  SLP provided pt with phonemic cues   []  Handouts on:   []  Neuro[employer] Online: computer software focused on predetermined therapy protocol in which  the patient is systematically presented with the tracks listed below in hierarchy contingent on completing prior to progression to higher level tasks.   []  Attention Skills   []  Executive Functioning Skills   []  Memory Skills   []  Visualspatial Skills   []  Problem Solving Skills   []  Communication Skills  []    [x]  Other: Memory strategies provide for application    Education/Home Exercise Program:    Patient was educated on deficits seen and goals established.  Pt. demonstrated an understanding of this education.    [x]   No updates to home program today    []   HEP updated:    Assessment:   Pt continues to meet early goals and build toward more complicated goals.   Patient presents with mild to moderate cognitive changes characterized by memory loss, difficulty with slow auditory comprehension and nonverbal memory tasks, difficulty with judgment and writing. Patient requires skilled therapy intervention to address Assessment: Impaired Memory, as well as the above mentioned deficits to maximize safety with daily activities and return to prior level of function.  Progress Toward Functional Goals:  Met  STG 2, 3 and LTG 2    Patient  Requires Continued Skilled Care to focus on visual memory in order to increased patient safety and independence.    Plan: Continue with established plan of care with focus on memory strategies.      Treatment Code # of Minutes   SLP Swallow Treatment [phone])    SLP Treatment Indiv [phone])    SLP Dev Cog Skills Ea (78469) Time Calculation (min): 60 min         Therapist Signature:   Brylinn Teaney, MEd, Information systems manager Pathologist/Senior Therapist  [facility]  Pediatric and Adult Rehabilitation [email or web address]  07/15/2013

## 2013-07-15 NOTE — Progress Notes (Signed)
[facility]  Pediatric and Adult Rehabilitation [facility]  [address]  [place] [address]  [phone]    Occupational Therapy Daily Note    PATIENT: Malayah A [name]   Referred By: Veneda Melter, MD  DOB: 06/03/1950     Medical Record #: [id number]  AGE: 63 y.o.    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  Visit: 13    Precautions and Contraindications:  L hemi, fall risk, TIA, h/o DVT    Subjective: "My spasms are still bad even with the medicine increase."    Objective: Pt presented alone to session.  Pt states she has notified her MD that she continues with spasms with medication and MD has increased dosage.  Pt appeared increased fatigued today.  Therapist is working with Mellissa Kohut to order stretch splint; sending order to MD.        Therapeutic Activity:  UE arm bike with R UE wrapped and Min A x10 minutes forward and back for reciprocal UE movement; attempted L UE only but pt required Mod-Max A    Retrograde massage with lotion while pt supine to decrease edema of hand while moist heat applied to UE, shoulder and neck to decrease pain while moist heat applied x10 minutes to shoulder.     Trigger point massage to upper traps and neck to decrease flexion tone and pain.    Neuromuscular Re-Education:   Began session with focus on scapula to decrease tightness:  -scapular mobs elevation/depression, protraction/retraction and D1/D2 patterns  -scapular squeeze 15 reps  -scapular protraction/retraction 15 reps side lying    AAROM supine:  -shoulder flexion to about 95* due to tightness; held at static stretch  -shoulder abduction to about 90* ROM; held at static stretch  -elbow flexion/extension 15 reps  -forearm  supination/pronation 20 reps  -wrist extension with extended stretch held to decrease tone 10 reps  -finger extension all digits with stretch held    Kinesiotaping applied similar to last session to back of hand with anchor towards axilla in fan pattern at back of digits.  Addition of wrist extensor facilitation taping with anchor at back of hand and forearm and 50% tension.  Pt with no discomfort at this time.    Pain: pain L shoulder with gentle ROM 4/10    Patient Education:  Patient educated on use of wrist cock up splint - pt plans to purchase on today.  Educated on kinesiotaping: pt should not feel pain, to remove if any redness/irritation/pain/blisters. Pt can shower with taping, no heat applied to surface.  Pt should remove if any discomfort.  To wear for 3 days.  Pt verbalized understanding of education.    Home Exercise Program: Pt to focus on gentle stretch of UE and scapular strengthening. Pt is to defer cane and AROM exercises at this time due to shoulder pain.  Pt educated to monitor her functional movements and avoid movements in which she hikes her shoulder.  Educated on technique to perform HEP.      Assessment: Pt continues with increased pain and flexion spasms overnight; now with significant tightness; therefore session focused on mobilization, soft tissue work and gentle ROM to increase extension and normal resting position of  UE for functional use.  Pt continues to require skilled OT intervention due to decreased strength, coordination and fine motor skills of L UE limiting her independence with ADL's, functional mobility and IADL's.    Goal Status:  Progressing    Plan:  Patient will benefit from continued skilled therapeutic intervention to address the above functional goals - Continue plan of care with the following special instructions for next visit: continue with weight bearing tasks  Goals:  Short Term Goals: 6 weeks:   1. Pt will be independent in HEP consisting of proper positioning of  L UE, edema management, ROM and fine motor exercises to increase functional movement and protect muscle integrity.   2. Pt will demo increase of active movement of L UE by at least 10* all planes in order to increase functional independence with ADL's and daily tasks.   3. Pt will be independent in don/doff and wearing schedule of Saebo Stretch splint in order to decrease tone of L UE and increase functional ROM.   4. Pt will don/doff shirt/jacket in <30 seconds after set-up with Mod I and no cues in order to increase independence and performance with ADL's.   Long Term Goals: 12 weeks:   1. Pt will demo increase of active movement of L UE by at least 20* all planes from initial evaluation in order to increase functional independence with ADL's and daily tasks.   2. Pt will increase L UE fine motor skills required for ADL's and daily tasks through ability to perform 9-hole peg test.   3. Pt will demo increase in functional use of L UE and ability to participate in ADL's and IADLs through increase stroke impact overall recovery scale to >50%.   4. Pt will demo increase in functional use of L UE through ability to demo self-feeding and grooming tasks in appropriate timeframe.   5. Pt will demo UB/LB dressing in sit/stand combo with use of hemi-technique and no cues for safety at Mod I level.      Luvenia Redden MS OTR/L  Occupational Therapist

## 2013-07-19 ENCOUNTER — Ambulatory Visit: Payer: No Typology Code available for payment source

## 2013-07-19 DIAGNOSIS — R29898 Other symptoms and signs involving the musculoskeletal system: Secondary | ICD-10-CM

## 2013-07-19 DIAGNOSIS — R279 Unspecified lack of coordination: Secondary | ICD-10-CM

## 2013-07-19 DIAGNOSIS — I6350 Cerebral infarction due to unspecified occlusion or stenosis of unspecified cerebral artery: Secondary | ICD-10-CM

## 2013-07-19 NOTE — Progress Notes (Signed)
[facility]  Pediatric and Adult Rehabilitation [facility]  [address] [place] [address]  [place] [address]  [phone]    Occupational Therapy Daily Note    PATIENT: Katherine [name]   Referred By: Veneda Melter, MD  DOB: 02/05/1951     Medical Record #: [id number]  AGE: 63 y.o.    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  Visit: 14    Precautions and Contraindications:  L hemi, fall risk, TIA, h/o DVT    Subjective: "The spasms are better."    Objective: Pt presented alone to session.  Pt presented late to session due to transportation difficulties.  Pt states her spasms have improved at home with use of increased medication.  Pt states she spoke with [facility] and has ordered the splint.       Therapeutic Activity:  UE arm bike with R UE wrapped and Min A x10 minutes forward and back for reciprocal UE movement; attempted L UE only but pt required Mod-Max A    Retrograde massage with lotion while pt supine to decrease edema of hand while moist heat applied to UE, shoulder and neck to decrease pain while moist heat applied x10 minutes to shoulder.     Neuromuscular Re-Education:   Scapula ROM to decrease tightness:  -scapular mobs elevation/depression, protraction/retraction and D1/D2 patterns 15 reps  -scapular squeeze 15 reps  -scapular protraction/retraction 15 reps side lying    AAROM supine:  -shoulder flexion to about 95* due to tightness; held at static stretch  -shoulder abduction to about 90* ROM; held at static stretch  -elbow flexion/extension 15 reps  -forearm supination/pronation 20 reps  -wrist extension with extended stretch held to decrease tone 10 reps  -finger extension all digits with stretch held  -ROM with  cane with difficulty; pt then performed SROM with hands clasped for shoulder flexion, abduction and D1/D2 patterns 10 reps each    Kinesiotaping applied similar to last session to back of hand with anchor towards axilla in fan pattern at back of digits.  Addition of wrist extensor facilitation taping with anchor at back of hand and forearm and 50% tension.  Pt with no discomfort at this time.    Pain: pain L shoulder with gentle ROM 3/10    Patient Education:  Patient educated on use of wrist cock up splint - pt plans to purchase on today.  Educated on kinesiotaping: pt should not feel pain, to remove if any redness/irritation/pain/blisters. Pt can shower with taping, no heat applied to surface.  Pt should remove if any discomfort.  To wear for 3 days.  Pt verbalized understanding of education.    Home Exercise Program: Pt to focus on gentle stretch of UE and scapular strengthening. Pt is to return to cane and AROM exercises at this time due to improvement with spasms and shoulder pain last few sessions.         Assessment: Pt presents with improved pain today of shoulder and improving flexion spasms at night.  Able to return to gentle AAROM of shoulder and elbow today.  Pt continues to require skilled OT intervention due to decreased strength, coordination and fine motor skills of L UE limiting her independence with ADL's, functional  mobility and IADL's.    Goal Status:  Progressing    Plan:  Patient will benefit from continued skilled therapeutic intervention to address the above functional goals - Continue plan of care with the following special instructions for next visit: continue with weight bearing tasks  Goals:  Short Term Goals: 6 weeks:   1. Pt will be independent in HEP consisting of proper positioning of L UE, edema management, ROM and fine motor exercises to increase functional movement and protect muscle integrity.   2. Pt will demo increase of active movement of L UE by at least 10* all planes in  order to increase functional independence with ADL's and daily tasks.   3. Pt will be independent in don/doff and wearing schedule of Saebo Stretch splint in order to decrease tone of L UE and increase functional ROM.   4. Pt will don/doff shirt/jacket in <30 seconds after set-up with Mod I and no cues in order to increase independence and performance with ADL's.   Long Term Goals: 12 weeks:   1. Pt will demo increase of active movement of L UE by at least 20* all planes from initial evaluation in order to increase functional independence with ADL's and daily tasks.   2. Pt will increase L UE fine motor skills required for ADL's and daily tasks through ability to perform 9-hole peg test.   3. Pt will demo increase in functional use of L UE and ability to participate in ADL's and IADLs through increase stroke impact overall recovery scale to >50%.   4. Pt will demo increase in functional use of L UE through ability to demo self-feeding and grooming tasks in appropriate timeframe.   5. Pt will demo UB/LB dressing in sit/stand combo with use of hemi-technique and no cues for safety at Mod I level.      Luvenia Redden MS OTR/L  Occupational Therapist

## 2013-07-21 ENCOUNTER — Ambulatory Visit: Payer: No Typology Code available for payment source

## 2013-07-22 ENCOUNTER — Ambulatory Visit: Payer: No Typology Code available for payment source

## 2013-07-26 ENCOUNTER — Ambulatory Visit: Payer: No Typology Code available for payment source

## 2013-07-26 DIAGNOSIS — R29898 Other symptoms and signs involving the musculoskeletal system: Secondary | ICD-10-CM

## 2013-07-26 DIAGNOSIS — I6350 Cerebral infarction due to unspecified occlusion or stenosis of unspecified cerebral artery: Secondary | ICD-10-CM

## 2013-07-26 NOTE — Progress Notes (Signed)
[facility]  Pediatric and Adult Rehabilitation [facility]  [address]  [place] [address]  [phone]    Occupational Therapy Daily Note    PATIENT: Katherine [name]   Referred By: Veneda Melter, MD  DOB: [birth year]     Medical Record #: [id number]  AGE: 63 y.o.    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  Visit: 15    Precautions and Contraindications:  L hemi, fall risk, TIA, h/o DVT    Subjective: "The spasms are less frequent; but still as intense."    Objective: Pt presented alone to session.  Pt continues with previous medication at night now increased to 3 pills to decrease spasms.         Therapeutic Activity:  Retrograde massage with lotion while pt supine to decrease edema of hand while moist heat applied to UE, shoulder and neck to decrease pain while moist heat applied x10 minutes to shoulder.     Neuromuscular Re-Education:   Scapula ROM to decrease tightness:  -scapular mobs elevation/depression, protraction/retraction and D1/D2 patterns 15 reps  -scapular squeeze 15 reps  -scapular protraction/retraction 15 reps side lying    AAROM supine with cane and Mod A to maintain grip:  -overhead shoulder flexion 10 reps  -chest press 10 reps    AAROM gentle AROM to decrease felxion synergy and tightness:  -shoulder flexion to about 95* due to tightness; held at static stretch  -shoulder abduction to about 90* ROM; held at static stretch  -elbow flexion/extension 15 reps  -forearm supination/pronation 20 reps  -wrist extension with extended stretch held to decrease tone 10 reps  -finger extension all digits with stretch held    Weight bearing to increase awareness to UE:  -sitting with UE out stretched  leaning into hand 4 reps  -onto elbow 5 reps    Kinesiotaping applied epidermis taping to dorsum of hand with 5 1/8" strips; anchor at nail beds with no tension.  Positioned with finger and wrist flexion with no tension distal to proximal attached to forearm.  Pt with no discomfort at this time.    Pain: pain L shoulder with gentle ROM 3/10    Patient Education:  Patient educated on use of wrist cock up splint - pt plans to purchase on today.  Educated on kinesiotaping: pt should not feel pain, to remove if any redness/irritation/pain/blisters. Pt can shower with taping, no heat applied to surface.  Pt should remove if any discomfort.  To wear for 3-5 days.  Pt verbalized understanding of education.    Home Exercise Program: Pt to focus on gentle stretch of UE and scapular strengthening. Pt is to return to cane and AROM exercises at this time due to improvement with spasms and shoulder pain last few sessions.         Assessment: Pt presents with improving flexion spasms at night; continues with limited tolerance in sessions for stretch and ROM of UE due to tightness and edema of hand.  Additional kinesiotaping of hand and will plan to try shoulder next session to further decrease flexion synergy.   Pt continues to require skilled OT intervention due to decreased strength, coordination and fine motor skills of L UE  limiting her independence with ADL's, functional mobility and IADL's.    Goal Status:  Progressing    Plan:  Patient will benefit from continued skilled therapeutic intervention to address the above functional goals - Continue plan of care with the following special instructions for next visit: continue with weight bearing tasks  Goals:  Short Term Goals: 6 weeks:   1. Pt will be independent in HEP consisting of proper positioning of L UE, edema management, ROM and fine motor exercises to increase functional movement and protect muscle integrity.   2. Pt will demo increase of active movement of L UE by at  least 10* all planes in order to increase functional independence with ADL's and daily tasks.   3. Pt will be independent in don/doff and wearing schedule of Saebo Stretch splint in order to decrease tone of L UE and increase functional ROM.   4. Pt will don/doff shirt/jacket in <30 seconds after set-up with Mod I and no cues in order to increase independence and performance with ADL's.   Long Term Goals: 12 weeks:   1. Pt will demo increase of active movement of L UE by at least 20* all planes from initial evaluation in order to increase functional independence with ADL's and daily tasks.   2. Pt will increase L UE fine motor skills required for ADL's and daily tasks through ability to perform 9-hole peg test.   3. Pt will demo increase in functional use of L UE and ability to participate in ADL's and IADLs through increase stroke impact overall recovery scale to >50%.   4. Pt will demo increase in functional use of L UE through ability to demo self-feeding and grooming tasks in appropriate timeframe.   5. Pt will demo UB/LB dressing in sit/stand combo with use of hemi-technique and no cues for safety at Mod I level.      Luvenia Redden MS OTR/L  Occupational Therapist

## 2013-07-28 ENCOUNTER — Ambulatory Visit: Payer: No Typology Code available for payment source

## 2013-07-29 ENCOUNTER — Ambulatory Visit: Payer: No Typology Code available for payment source

## 2013-07-29 DIAGNOSIS — R29898 Other symptoms and signs involving the musculoskeletal system: Secondary | ICD-10-CM

## 2013-07-29 DIAGNOSIS — I6350 Cerebral infarction due to unspecified occlusion or stenosis of unspecified cerebral artery: Secondary | ICD-10-CM

## 2013-07-29 DIAGNOSIS — R41841 Cognitive communication deficit: Secondary | ICD-10-CM

## 2013-07-29 NOTE — Progress Notes (Addendum)
[facility]  [address], [address]  [place], [place]  [address]  Phone:  [phone]  Fax:  [phone]    Speech Therapy Daily Note    PATIENT: Katherine [name]    Referred By: Veneda Melter, MD  DOB:   April 12, 1950                                 Medical Record #: [id number]  AGE:   63 y.o.                          Primary Diagnosis: CVA (cerebral infarction) [434.91]  Facility Provider #: [phone]   HICN# Patient is not covered by Medicare.    Treatment Diagnosis:  Executive Function/Memory Deficits 799.55    Visit #: SLP Visit  SLP Visit Number: 7  Visit Limit:  30 visits      Certification Period: 05/26/13 to 11/23/13     Time of treatment:   Time Calculation  SLP Received On: 07/29/2013  Start Time: 1400  Stop Time: 1500  Time Calculation (min): 60 min    Medications:  has a current medication list which includes the following prescription(s): atorvastatin, insulin aspart, insulin glargine, insulin syringe 1cc/29g, pramipexole, and rivaroxaban.    Patient/parent does not report changes to medication at this time.    Patient Goal:  Patient Goal: To improve cognition, return to work and use a computer for return to work.    Therapy Goals:  (Copied from Evaluation)  Goal status updated today.    Short Term Goals:   To be completed by 08/23/13:  1.  Pt will complete Test of Memory and Learning x 100%. *Mastered  2.  Pt will recall memory strategies x 100% I.  *Met x 2  3.  Pt will demonstrate use of memory strategies to organize a list of 20 words for recall x 80% with min cues.  *Mastered  4.  Added 07/07/13: Pt will complete convergent naming activities with concrete and abstract items x 90% I.  5.  Added 07/07/13: Pt will complete executive function tasks of reasoning, decision making and self-correction x 90% I using requirements per [employer].     Long Term Goals:  To be completed by 11/23/13:  1.  Pt will answer questions from visual stimulus  of 3-5 minutes x 80% with min cues.   2.  Pt will recall a list of 20 words x 90% with self-cues. *Mastered  3.  Pt will demonstrate immediate recall of location of 4-8 objects x 90% with self-cues.    Subjective:   Pain report:  [x]   [name] reported today.  []   Pain level  /10 today.  Location:      Subjective patient report:  Pt reports minor difficulties with homework assignment of completing concrete categories with an additional item.    Objective:  Objective Findings today:   STG 2: Mastered.  STG 4: Completed x 80% with min verbal cues on abstract and 90% I on concrete.    Interventions Performed:   []  SLP provided pt with tactile cues:   []  SLP provided pt with visual cues   [x]  SLP provided pt with verbal cues - min  []  SLP provided pt with phonemic cues   []  Handouts on:   []  [employer]: computer software focused on  predetermined therapy protocol in which the patient is systematically presented with the tracks listed below in hierarchy contingent on completing prior to progression to higher level tasks.   []  Attention Skills   []  Executive Functioning Skills   []  Memory Skills   []  Visualspatial Skills   []  Problem Solving Skills   []  Communication Skills  []    [x]  Other: Handouts on abstract convergent naming presented verbally and visually [facility] -2     Education/Home Exercise Program:    Patient was educated on naming categories and adding for two functions in tasks for convergent naming.  Pt. demonstrated an understanding of this education.    []   No updates to home program today    [x]   HEP updated:  Handouts on convergent naming  [facility] -2  Assessment:   Pt continues to have difficulty with abstract identification, but increases with re-inforcement from SLP.   Patient presents with mild to moderate cognitive changes characterized by memory loss, difficulty with slow auditory comprehension and nonverbal memory tasks, difficulty with judgment and writing. Patient requires skilled therapy  intervention to address Assessment: Impaired Memory, as well as the above mentioned deficits to maximize safety with daily activities and return to prior level of function.  Progress Toward Functional Goals:  STG 2 Mastered in stating and application    Patient Requires Continued Skilled Care to focus on visual memory in order to increased patient safety and independence.    Plan: Continue with established plan of care with focus on memory strategies.      Treatment Code # of Minutes   SLP Swallow Treatment [phone])    SLP Treatment Rudean Hitt [phone]) Time Calculation (min): 60 min             Therapist Signature:   Tighe Gitto, MEd, Information systems manager Pathologist/Senior Therapist  [facility]  Pediatric and Adult Rehabilitation [email or web address]  07/29/2013

## 2013-07-29 NOTE — Progress Notes (Signed)
[facility]  Pediatric and Adult Rehabilitation [facility]  [address]  [place] [address]  [phone]    Occupational Therapy Daily Note    PATIENT: Katherine [name]   Referred By: Veneda Melter, MD  DOB: 10-02-1950     Medical Record #: [id number]  AGE: 63 y.o.    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  Visit: 16    Precautions and Contraindications:  L hemi, fall risk, TIA, h/o DVT    Subjective: "The spasms are less frequent; but still as intense."    Objective: Pt presented alone to session.  Pt continues with previous medication at night now increased to 3 pills to decrease spasms.  Pt states epidermis kinesiotaping was not as helpful as previous edema taping.      Therapeutic Activity:  Retrograde massage with lotion while pt supine to decrease edema of hand while moist heat applied to UE, shoulder and neck to decrease pain while moist heat applied x10 minutes to shoulder.     Neuromuscular Re-Education:   Scapula ROM to decrease tightness:  -scapular mobs elevation/depression, protraction/retraction and D1/D2 patterns 15 reps  -scapular squeeze 15 reps in sitting  -scapular protraction/retraction 15 reps side lying  -shoulder shrugs 20 reps in sitting    AAROM gentle AROM to decrease felxion synergy and tightness:  -shoulder flexion to about 95* due to tightness; held at static stretch  -shoulder abduction to about 90* ROM; held at static stretch  -wrist extension with extended stretch held to decrease tone 10 reps  -finger extension all digits with stretch held  -static hold of UE in extension pattern to decrease tone    Weight bearing to increase awareness to UE:  -sitting with UE out stretched  leaning into hand 4 reps  -onto elbow 5 reps    ROM with hemi-glide:  -shoulder flexion to about 80* with extension  -shoulder abduction  -shoulder adduction  -supination with horizontal abduction    Kinesiotaping applied edema taping to back of hand with no tension and strips to each digit.  Wrist extensor facilitation stretch with 50% tension.  Pt with no discomfort at this time.    Pain: pain L shoulder with gentle ROM 3/10    Patient Education:  Patient educated on use of wrist cock up splint - pt plans to purchase on today.  Educated on kinesiotaping: pt should not feel pain, to remove if any redness/irritation/pain/blisters. Pt can shower with taping, no heat applied to surface.  Pt should remove if any discomfort.  To wear for 3-5 days.  Pt verbalized understanding of education.    Home Exercise Program: Pt to focus on gentle stretch of UE and scapular strengthening. Pt is to return to cane and AROM exercises at this time due to improvement with spasms and shoulder pain last few sessions.         Assessment: Pt with improved tolerance in today's session with less pain in shoulder during ROM and able to return to hemi-glide exercises.   Next session to focus on fitting pt for Saebo splint to further decrease flexion synergy of wrist and hand.  Pt continues to require skilled OT intervention due to decreased strength, coordination and  fine motor skills of L UE limiting her independence with ADL's, functional mobility and IADL's.    Goal Status:  Progressing    Plan:  Patient will benefit from continued skilled therapeutic intervention to address the above functional goals - Continue plan of care with the following special instructions for next visit: continue with weight bearing tasks  Goals:  Short Term Goals: 6 weeks:   1. Pt will be independent in HEP consisting of proper positioning of L UE, edema management, ROM and fine motor exercises to increase functional movement and protect muscle integrity.   2. Pt  will demo increase of active movement of L UE by at least 10* all planes in order to increase functional independence with ADL's and daily tasks.   3. Pt will be independent in don/doff and wearing schedule of Saebo Stretch splint in order to decrease tone of L UE and increase functional ROM.   4. Pt will don/doff shirt/jacket in <30 seconds after set-up with Mod I and no cues in order to increase independence and performance with ADL's.   Long Term Goals: 12 weeks:   1. Pt will demo increase of active movement of L UE by at least 20* all planes from initial evaluation in order to increase functional independence with ADL's and daily tasks.   2. Pt will increase L UE fine motor skills required for ADL's and daily tasks through ability to perform 9-hole peg test.   3. Pt will demo increase in functional use of L UE and ability to participate in ADL's and IADLs through increase stroke impact overall recovery scale to >50%.   4. Pt will demo increase in functional use of L UE through ability to demo self-feeding and grooming tasks in appropriate timeframe.   5. Pt will demo UB/LB dressing in sit/stand combo with use of hemi-technique and no cues for safety at Mod I level.      Luvenia Redden MS OTR/L  Occupational Therapist

## 2013-07-30 DIAGNOSIS — R29898 Other symptoms and signs involving the musculoskeletal system: Secondary | ICD-10-CM | POA: Insufficient documentation

## 2013-07-30 DIAGNOSIS — I69998 Other sequelae following unspecified cerebrovascular disease: Secondary | ICD-10-CM | POA: Insufficient documentation

## 2013-08-02 ENCOUNTER — Ambulatory Visit: Payer: No Typology Code available for payment source

## 2013-08-03 ENCOUNTER — Ambulatory Visit: Payer: No Typology Code available for payment source

## 2013-08-03 DIAGNOSIS — M6281 Muscle weakness (generalized): Secondary | ICD-10-CM

## 2013-08-03 DIAGNOSIS — I6350 Cerebral infarction due to unspecified occlusion or stenosis of unspecified cerebral artery: Secondary | ICD-10-CM

## 2013-08-03 NOTE — Progress Notes (Addendum)
[facility]  Pediatric and Adult Rehabilitation [facility]  [address]  [place] [address]  [phone]    Occupational Therapy Daily Note    PATIENT: Katherine [name]   Referred By: Veneda Melter, MD  DOB: 01-07-1951     Medical Record #: [id number]  AGE: 63 y.o.    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  Visit: 17    Precautions and Contraindications:  L hemi, fall risk, TIA, h/o DVT    Subjective: "I have to physical reach over and open my hand when it spasms."    Objective: Pt presented alone to session.  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      Therapeutic Activity:  Saebo Stretch Fitment:  -Resistance plate changed to yellow secondary to patient report of spasm being of high intensity at night.  Yellow plate allows more movement and decrease chances of patient UE coming out of Saebo Stretch or needing repositioning.    -Adjusted side flaps to cradle patient forearm.  -Provide ~5-10* of wrist flexion as patient initiates wearing schedule.  Patient PROM allows ~15* extension.    -Educate patient on strategies for donning splint such as starting at forearm, lining fingers up with far edge of splint, and bending splint to match fingers.    -Educate patient on removal of splint cover for washing and hang dry.    -Educated patient on warning signs such as hotspots or skin breakdown.  Educated patient to pay close attention to edema and avoid wearing splint if edema is unusually high.    -Educate patient on wearing schedule - starting with 2 hours for 1 week.  Pending any issues with fitment or skin breakdown patient can progress wearing schedule per therapist recommendation.      Neuromuscular  Re-Education:   AAROM gentle AROM to decrease felxion synergy and tightness:   -shoulder flexion to about 95* due to tightness; held at static stretch   -shoulder abduction to about 90* ROM; held at static stretch   -wrist extension with extended stretch held to decrease tone 10 reps   -finger extension all digits with stretch held   -static hold of UE in extension pattern to decrease tone   Kinesiotaping applied edema taping to back of hand with no tension and strips to each digit. Wrist extensor facilitation stretch with 50% tension. Pt with no discomfort at this time.    Pain: no c/o    Patient Education:  See above    Home Exercise Program: Pt to continue with focus on gentle stretch of UE and scapular strengthening. Pt is to return to cane and AROM exercises at this time due to improvement with spasms and shoulder pain last few sessions.    Assessment: Pt with good outcome with fitting and wearing of Saebo splint. Pt able to don herself at end of session with minimal verbal cues. Splint appears to fit well with no discomfort at this time. Will continue to monitor pt and increase wearing time to transition to night time use. Pt continues to require skilled OT intervention due to decreased strength, coordination and fine motor skills of L UE limiting her independence with ADL's, functional mobility and IADL's.  Goal Status:  Progressing    Plan:  Patient will benefit from continued skilled therapeutic intervention to address the above functional goals - Continue plan of care with the following special instructions for next visit: continue with weight bearing tasks  Goals:  Short Term Goals: 6 weeks:   1. Pt will be independent in HEP consisting of proper positioning of L UE, edema management, ROM and fine motor exercises to increase functional movement and protect muscle integrity.   2. Pt will demo increase of active movement of L UE by at least 10* all planes in order to increase functional independence with  ADL's and daily tasks.   3. Pt will be independent in don/doff and wearing schedule of Saebo Stretch splint in order to decrease tone of L UE and increase functional ROM.   4. Pt will don/doff shirt/jacket in <30 seconds after set-up with Mod I and no cues in order to increase independence and performance with ADL's.   Long Term Goals: 12 weeks:   1. Pt will demo increase of active movement of L UE by at least 20* all planes from initial evaluation in order to increase functional independence with ADL's and daily tasks.   2. Pt will increase L UE fine motor skills required for ADL's and daily tasks through ability to perform 9-hole peg test.   3. Pt will demo increase in functional use of L UE and ability to participate in ADL's and IADLs through increase stroke impact overall recovery scale to >50%.   4. Pt will demo increase in functional use of L UE through ability to demo self-feeding and grooming tasks in appropriate timeframe.   5. Pt will demo UB/LB dressing in sit/stand combo with use of hemi-technique and no cues for safety at Mod I level.      Henrene Dodge, MS OTR/L  Occupational Therapist     Luvenia Redden MS OTR/L  Occupational Therapist

## 2013-08-04 ENCOUNTER — Ambulatory Visit: Payer: No Typology Code available for payment source

## 2013-08-05 ENCOUNTER — Ambulatory Visit: Payer: No Typology Code available for payment source

## 2013-08-05 DIAGNOSIS — I6350 Cerebral infarction due to unspecified occlusion or stenosis of unspecified cerebral artery: Secondary | ICD-10-CM

## 2013-08-05 DIAGNOSIS — M6281 Muscle weakness (generalized): Secondary | ICD-10-CM

## 2013-08-05 DIAGNOSIS — R279 Unspecified lack of coordination: Secondary | ICD-10-CM

## 2013-08-05 DIAGNOSIS — R4189 Other symptoms and signs involving cognitive functions and awareness: Secondary | ICD-10-CM

## 2013-08-05 NOTE — Progress Notes (Signed)
[facility]  [address]  [place], [place]  [address]  Phone:  [phone]  Fax:  [phone]    Speech Therapy Daily Note    PATIENT: Katherine [name]    Referred By: Veneda Melter, MD  DOB:   Jun 06, 1950                                 Medical Record #: [id number]  AGE:   63 y.o.                          Primary Diagnosis: CVA (cerebral infarction) [434.91]  Facility Provider #: [phone]   HICN# Patient is not covered by Medicare.    Treatment Diagnosis:  Executive Function/Memory Deficits 799.55    Visit #: SLP Visit  SLP Visit Number: 8  Visit Limit:  30 visits      Certification Period: 05/26/13 to 11/23/13     Time of treatment:   Time Calculation  SLP Received On: 08/05/2013  Start Time: 1400  Stop Time: 1500  Time Calculation (min): 60 min    Medications:  has a current medication list which includes the following prescription(s): atorvastatin, insulin aspart, insulin glargine, insulin syringe 1cc/29g, pramipexole, and rivaroxaban.    Patient/parent does not report changes to medication at this time.    Patient Goal:  Patient Goal: To improve cognition, return to work and use a computer for return to work.    Therapy Goals:  (Copied from Evaluation)  Goal status updated today.    Short Term Goals:   To be completed by 08/23/13:  1.  Pt will complete Test of Memory and Learning x 100%. *Mastered  2.  Pt will recall memory strategies x 100% I.  *Met x 2  3.  Pt will demonstrate use of memory strategies to organize a list of 20 words for recall x 80% with min cues.  *Mastered  4.  Added 07/07/13: Pt will complete convergent naming activities with concrete and abstract items x 90% I.  5.  Added 07/07/13: Pt will complete executive function tasks of reasoning, decision making and self-correction x 90% I using requirements per Neuro[employer] Online.     Long Term Goals:  To be completed by 11/23/13:  1.  Pt will answer questions from visual stimulus of  3-5 minutes x 80% with min cues.   2.  Pt will recall a list of 20 words x 90% with self-cues. *Mastered  3.  Pt will demonstrate immediate recall of location of 4-8 objects x 90% with self-cues.    Subjective:   Pain report:  [x]   [name] reported today.  []   Pain level  /10 today.  Location:      Subjective patient report:  "I was looking for something that I knew was in my bathroom.  I couldn't find it.  I walked out of the room and came back and there it was."     Objective:  Objective Findings today:   LTG 1: Completed x 66% with 3 minutes, 33% with 1 min. No cues.   STG 5: Completed min tasks x 90% with min cues.  Baseline data obtained from NPO on attention.  New goals will be established.  STG 6: Pt will complete sustained attention task with average variance <900 milliseconds and average reaction time between  200-500 milliseconds.  Today completed 0/2 attempts.      Interventions Performed:   []  SLP provided pt with tactile cues:   []  SLP provided pt with visual cues   [x]  SLP provided pt with verbal cues - min  []  SLP provided pt with phonemic cues   []  Handouts on:   [x]  NeuroPsych Online: computer software focused on predetermined therapy protocol in which the patient is systematically presented with the tracks listed below in hierarchy contingent on completing prior to progression to higher level tasks.   [x]  Attention Skills   [x]  Executive Functioning Skills   []  Memory Skills   []  Visualspatial Skills   []  Problem Solving Skills   []  Communication Skills  []    [x]  Other: Handouts on abstract convergent naming presented verbally and visually [facility] -2     Education/Home Exercise Program:    Patient was educated on attention and decision making.  Pt. demonstrated an understanding of this education.    []   No updates to home program today    [x]   HEP updated:  Handouts on convergent naming  [facility] -2  Assessment:   Pt continues to have difficulty with abstract identification, but increases with  re-inforcement from SLP.   Patient presents with mild to moderate cognitive changes characterized by memory loss, difficulty with slow auditory comprehension and nonverbal memory tasks, difficulty with judgment and writing. Patient requires skilled therapy intervention to address Assessment: Impaired Memory, as well as the above mentioned deficits to maximize safety with daily activities and return to prior level of function.  Progress Toward Functional Goals:  STG 2 Mastered in stating and application    Patient Requires Continued Skilled Care to focus on visual memory in order to increased patient safety and independence.    Plan: Continue with established plan of care with focus on memory strategies.      Treatment Code # of Minutes   SLP Swallow Treatment [phone])    SLP Treatment Rudean Hitt [phone]) Time Calculation (min): 60 min             Therapist Signature:   Lamoine Magallon, MEd, Information systems manager Pathologist/Senior Therapist  [facility]  Pediatric and Adult Rehabilitation [email or web address]  08/05/2013

## 2013-08-05 NOTE — Progress Notes (Signed)
[facility]  Pediatric and Adult Rehabilitation [facility]  [address]  [place] [address]  [phone]    Occupational Therapy Daily Note    PATIENT: Katherine [name]   Referred By: Veneda Melter, MD  DOB: 09-16-1950     Medical Record #: [id number]  AGE: 63 y.o.    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  Visit: 18    Precautions and Contraindications:  L hemi, fall risk, TIA, h/o DVT    Subjective: "The spasms are less frequent"    Objective: Pt presented alone to session.  Pt states she wore the Saebo splint overnight because she was confused.  Pt was educated to work her way up to wearing overnight at fitting session but stated she wanted to try it at night.  Pt states no discomfort and spasms were less intense with splint.  Pt educated to continue with 1hr/day for one week then slowly increase to 2hrs/day, then 4hrs/day x1 week then transition to overnight.  Pt agreed and verbalized back instructions to therapist.    Therapeutic Activity:  Retrograde massage with lotion while pt supine to decrease edema of hand while moist heat applied to UE, shoulder and neck to decrease pain while moist heat applied x10 minutes to shoulder.     Neuromuscular Re-Education:   Scapula ROM to decrease tightness:  -scapular mobs elevation/depression, protraction/retraction and D1/D2 patterns 15 reps  -scapular squeeze 15 reps in sitting  -shoulder shrugs 20 reps in sitting    AAROM gentle AROM to decrease felxion synergy and tightness:  -shoulder flexion to about 95* due to tightness; held at static stretch  -shoulder abduction to about 90* ROM; held at static stretch  -wrist extension with extended stretch held to decrease  tone 10 reps  -finger extension all digits with stretch held  -static hold of UE in extension pattern to decrease tone    Weight bearing to increase awareness to UE:  -sitting with UE out stretched leaning into hand 4 reps  -onto elbow 5 reps    ROM with T-bar:  -chest press with Mod A for grip 10 reps    NMES to L wrist extensors x10 minutes up to level 25 on PPR 7.  Pt tolerated well with positive finger extension in thumb, 2nd digit, negative wrist extension today.    Kinesiotaping applied edema taping to back of hand with no tension and strips to each digit.  Wrist extensor facilitation stretch with 50% tension.  Pt with no discomfort at this time.    Pain: pain L shoulder with gentle ROM 3/10    Patient Education:  Patient educated on Saebo splint as stated above.  Educated on kinesiotaping: pt should not feel pain, to remove if any redness/irritation/pain/blisters. Pt can shower with taping, no heat applied to surface.  Pt should remove if any discomfort.  To wear for 3-5 days.  Pt verbalized understanding of education.    Home Exercise Program: Pt to focus on gentle stretch of UE and scapular strengthening. Pt is to return to cane and AROM exercises at this time due to improvement with spasms and shoulder pain last few sessions.         Assessment: Pt  tolerated Saebo splint well with no signs of discomfort although she did wear it longer then advised.  pt agrees with current wearing schedule.  Pt with good benefit from NMES with positive movement of digits.  Pt continues to require skilled OT intervention due to decreased strength, coordination and fine motor skills of L UE limiting her independence with ADL's, functional mobility and IADL's.    Goal Status:  Progressing    Plan:  Patient will benefit from continued skilled therapeutic intervention to address the above functional goals - Continue plan of care with the following special instructions for next visit: continue with weight bearing  tasks  Goals:  Short Term Goals: 6 weeks:   1. Pt will be independent in HEP consisting of proper positioning of L UE, edema management, ROM and fine motor exercises to increase functional movement and protect muscle integrity.   2. Pt will demo increase of active movement of L UE by at least 10* all planes in order to increase functional independence with ADL's and daily tasks.   3. Pt will be independent in don/doff and wearing schedule of Saebo Stretch splint in order to decrease tone of L UE and increase functional ROM.   4. Pt will don/doff shirt/jacket in <30 seconds after set-up with Mod I and no cues in order to increase independence and performance with ADL's.   Long Term Goals: 12 weeks:   1. Pt will demo increase of active movement of L UE by at least 20* all planes from initial evaluation in order to increase functional independence with ADL's and daily tasks.   2. Pt will increase L UE fine motor skills required for ADL's and daily tasks through ability to perform 9-hole peg test.   3. Pt will demo increase in functional use of L UE and ability to participate in ADL's and IADLs through increase stroke impact overall recovery scale to >50%.   4. Pt will demo increase in functional use of L UE through ability to demo self-feeding and grooming tasks in appropriate timeframe.   5. Pt will demo UB/LB dressing in sit/stand combo with use of hemi-technique and no cues for safety at Mod I level.      Luvenia Redden MS OTR/L  Occupational Therapist

## 2013-08-09 ENCOUNTER — Ambulatory Visit: Payer: No Typology Code available for payment source

## 2013-08-09 DIAGNOSIS — R29898 Other symptoms and signs involving the musculoskeletal system: Secondary | ICD-10-CM

## 2013-08-09 DIAGNOSIS — R279 Unspecified lack of coordination: Secondary | ICD-10-CM

## 2013-08-09 DIAGNOSIS — I6350 Cerebral infarction due to unspecified occlusion or stenosis of unspecified cerebral artery: Secondary | ICD-10-CM

## 2013-08-09 NOTE — Progress Notes (Signed)
[facility]  Pediatric and Adult Rehabilitation [facility]  [address]  [place] [address]  [phone]    Occupational Therapy Daily Note    PATIENT: Katherine [name]   Referred By: Veneda Melter, MD  DOB: 1950/09/09     Medical Record #: [id number]  AGE: 63 y.o.    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  Visit: 19    Precautions and Contraindications:  L hemi, fall risk, TIA, h/o DVT    Subjective: "My hand feels better.  I like the splint."    Objective: Pt presented alone to session wearing Saebo splint.  Pt states she has been wearing the splint x1 hr/day and feels her fingers do slip out of splint.            Neuromuscular Re-Education:   Assessed splint at start of session.  Pt with redness at thumb joint and fingers had slipped back on splint.  Educated pt on proper positioning and to correct this when noticed.  Pt's redness did fade after 15 minutes into session.  At end of session pt donned splint at table top with minimal verbal cues on technique for positioning.    Retrograde massage with lotion while pt supine to decrease edema     Scapula ROM to decrease tightness:  -scapular squeeze 15 reps in sitting  -shoulder shrugs 20 reps in sitting    AAROM gentle AROM to decrease felxion synergy and tightness:  -shoulder flexion to about 95* due to tightness; held at static stretch  -shoulder abduction to about 90* ROM; held at static stretch  -wrist extension with extended stretch held to decrease tone 10 reps  -finger extension all digits with stretch held  -static hold of UE in extension pattern to decrease tone    ROM with hemi-glide:  -shoulder flexion 10 reps with Min A  -shoulder abduction 10 reps  with Min A  -D1/D2 10 reps with Min A for grip  -shoulder extension 10 reps     NMES to L wrist extensors x10 minutes up to level 29 on PPR 7.  Pt tolerated well with positive finger extension in thumb and all digits.  Pt with active wrist extension noted today.    Kinesiotaping applied edema taping to back of hand with no tension and strips to each digit.  Wrist extensor facilitation stretch with 50% tension.  Pt with no discomfort at this time.    Pain: pain L shoulder with gentle ROM 3/10    Patient Education:  Patient educated on Saebo splint as stated above.  Educated on kinesiotaping: pt should not feel pain, to remove if any redness/irritation/pain/blisters. Pt can shower with taping, no heat applied to surface.  Pt should remove if any discomfort.  To wear for 3-5 days.  Pt verbalized understanding of education.    Home Exercise Program: Pt to focus on gentle stretch of UE and scapular strengthening. Pt is to return to cane and AROM exercises at this time due to improvement with spasms and shoulder pain last few sessions.   Pt is to increase wearing of Saebo splint to 2hrs/day for the next week.  Pt agreed.     Assessment: Pt tolerated Katherine [name]  splint well; will continue to monitor positioning and redness at thumb joint.    Pt with good benefit from NMES with positive movement of digits and now wrist extension today.  Pt continues to require skilled OT intervention due to decreased strength, coordination and fine motor skills of L UE limiting her independence with ADL's, functional mobility and IADL's.    Goal Status:  Progressing    Plan:  Patient will benefit from continued skilled therapeutic intervention to address the above functional goals - Continue plan of care with the following special instructions for next visit: continue with weight bearing tasks, re-test ROM  Goals:  Short Term Goals: 6 weeks:   1. Pt will be independent in HEP consisting of proper positioning of L UE, edema management, ROM and  fine motor exercises to increase functional movement and protect muscle integrity.   2. Pt will demo increase of active movement of L UE by at least 10* all planes in order to increase functional independence with ADL's and daily tasks.   3. Pt will be independent in don/doff and wearing schedule of Saebo Stretch splint in order to decrease tone of L UE and increase functional ROM.   4. Pt will don/doff shirt/jacket in <30 seconds after set-up with Mod I and no cues in order to increase independence and performance with ADL's.   Long Term Goals: 12 weeks:   1. Pt will demo increase of active movement of L UE by at least 20* all planes from initial evaluation in order to increase functional independence with ADL's and daily tasks.   2. Pt will increase L UE fine motor skills required for ADL's and daily tasks through ability to perform 9-hole peg test.   3. Pt will demo increase in functional use of L UE and ability to participate in ADL's and IADLs through increase stroke impact overall recovery scale to >50%.   4. Pt will demo increase in functional use of L UE through ability to demo self-feeding and grooming tasks in appropriate timeframe.   5. Pt will demo UB/LB dressing in sit/stand combo with use of hemi-technique and no cues for safety at Mod I level.      Luvenia Redden MS OTR/L  Occupational Therapist

## 2013-08-11 ENCOUNTER — Ambulatory Visit: Payer: No Typology Code available for payment source

## 2013-08-12 ENCOUNTER — Ambulatory Visit: Payer: No Typology Code available for payment source

## 2013-08-12 DIAGNOSIS — R41844 Frontal lobe and executive function deficit: Secondary | ICD-10-CM

## 2013-08-12 DIAGNOSIS — I6350 Cerebral infarction due to unspecified occlusion or stenosis of unspecified cerebral artery: Secondary | ICD-10-CM

## 2013-08-12 DIAGNOSIS — R29898 Other symptoms and signs involving the musculoskeletal system: Secondary | ICD-10-CM

## 2013-08-12 NOTE — Progress Notes (Signed)
[facility]  Pediatric and Adult Rehabilitation [facility]  [address]  [place] [address]  [phone]    Occupational Therapy Daily Note    PATIENT: Katherine [name]   Referred By: Veneda Melter, MD  DOB: 1950/05/09     Medical Record #: [id number]  AGE: 63 y.o.    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  Visit: 20    Precautions and Contraindications:  L hemi, fall risk, TIA, h/o DVT    Subjective: "I've been wearing the splint before bed at night."    Objective: Pt presented alone to session.  Pt with increased edema of L hand and states tape did not last since last session.  Pt states she has been wearing the splint x2 hr/day and feels her fingers no longer slip out of splint and it has helped lessen the intensity of spasms at night.    Neuromuscular Re-Education:   Retrograde massage with lotion while pt supine to decrease edema of L hand    Scapula ROM to decrease tightness in sidelying:  -shoulder shrugs 20 reps with Min facilitation  -shoulder abduction and flexion stretch to about 90* with less c/o pain versus supine    AAROM gentle AROM to decrease felxion synergy and tightness while moist heat applied to L shoulder to decrease pain x10 minutes:  -wrist extension with extended stretch held to decrease tone 10 reps  -finger extension all digits with stretch held  -static hold of UE in extension pattern to decrease tone    NMES to L wrist extensors x10 minutes up to level 29-33 on PPR 7.  Pt tolerated well with positive finger extension in first digit.  Progressed to NMES at level 27 to upper deltoid and scapula following subluxation setting to increase active movement and re-education for shoulder.   Pt with positive muscle movement.    Kinesiotaping applied edema taping to back of hand with no tension and strips to each digit.  Wrist extensor facilitation stretch with 50% tension.  Pt with no discomfort at this time.    Pain: pain L shoulder with gentle ROM 3/10    Patient Education:  Patient educated on Saebo splint as stated above.  Educated on kinesiotaping: pt should not feel pain, to remove if any redness/irritation/pain/blisters. Pt can shower with taping, no heat applied to surface.  Pt should remove if any discomfort.  To wear for 3-5 days.  Pt verbalized understanding of education.    Home Exercise Program: Pt to focus on gentle stretch of UE and scapular strengthening. Pt is to return to cane and AROM exercises at this time due to improvement with spasms and shoulder pain last few sessions.   Pt is to increase wearing of Saebo splint to 2hrs/day until next session.  Pt agreed.     Assessment:   Pt with good benefit from NMES at wrist and shoulder; less active movement of wrist and fingers noted; but pt with increased edema and flexion tone today.  Pt continues to require skilled OT intervention due to decreased strength, coordination and fine motor skills of L UE limiting her independence with ADL's, functional mobility and IADL's.    Goal Status:  Progressing  Plan:  Patient will benefit from continued skilled therapeutic intervention to address the above functional goals - Continue plan of care with the following special instructions for next visit: continue with weight bearing tasks, re-test ROM  Goals:  Short Term Goals: 6 weeks:   1. Pt will be independent in HEP consisting of proper positioning of L UE, edema management, ROM and fine motor exercises to increase functional movement and protect muscle integrity. (goal met)  2. Pt will demo increase of active movement of L UE by at least 10* all planes in order to increase functional independence with ADL's and daily tasks. (progressing)  3. Pt  will be independent in don/doff and wearing schedule of Saebo Stretch splint in order to decrease tone of L UE and increase functional ROM. (goal met)  4. Pt will don/doff shirt/jacket in <30 seconds after set-up with Mod I and no cues in order to increase independence and performance with ADL's. (progressing)  Long Term Goals: 12 weeks:   1. Pt will demo increase of active movement of L UE by at least 20* all planes from initial evaluation in order to increase functional independence with ADL's and daily tasks. (not met)  2. Pt will increase L UE fine motor skills required for ADL's and daily tasks through ability to perform 9-hole peg test. (not met)  3. Pt will demo increase in functional use of L UE and ability to participate in ADL's and IADLs through increase stroke impact overall recovery scale to >50%. (not assessed)  4. Pt will demo increase in functional use of L UE through ability to demo self-feeding and grooming tasks in appropriate timeframe. (progressing)  5. Pt will demo UB/LB dressing in sit/stand combo with use of hemi-technique and no cues for safety at Mod I level. (met)      Luvenia Redden MS OTR/L  Occupational Therapist

## 2013-08-12 NOTE — Progress Notes (Signed)
[facility]  [address]  [place], [place]  [address]  Phone:  [phone]  Fax:  [phone]    Speech Therapy Daily Note    PATIENT: Katherine [name]    Referred By: Veneda Melter, MD  DOB:   [birth year]                                 Medical Record #: [id number]  AGE:   63 y.o.                          Primary Diagnosis: CVA (cerebral infarction) [434.91]  Facility Provider #: [phone]   HICN# Patient is not covered by Medicare.    Treatment Diagnosis:  Executive Function/Memory Deficits 799.55    Visit #: SLP Visit  SLP Visit Number: 9  Visit Limit:  30 visits      Certification Period: 05/26/13 to 11/23/13     Time of treatment:   Time Calculation  SLP Received On: 08/12/2013  Start Time: 1400  Stop Time: 1500  Time Calculation (min): 60 min    Medications:  has a current medication list which includes the following prescription(s): atorvastatin, insulin aspart, insulin glargine, insulin syringe 1cc/29g, pramipexole, and rivaroxaban.    Patient/parent does not report changes to medication at this time.    Patient Goal:  Patient Goal: To improve cognition, return to work and use a computer for return to work.    Therapy Goals:  (Copied from Evaluation)  Goal status updated today.    Short Term Goals:   To be completed by 08/23/13:  1.  Pt will complete Test of Memory and Learning x 100%. *Mastered  2.  Pt will recall memory strategies x 100% I.  *Mastered  3.  Pt will demonstrate use of memory strategies to organize a list of 20 words for recall x 80% with min cues.  *Mastered  4.  Added 07/07/13: Pt will complete convergent naming activities with concrete and abstract items x 90% I.  5.  Added 07/07/13: Pt will complete executive function tasks of reasoning, decision making and self-correction x 90% I using requirements per NeuroPsych Online.     Long Term Goals:  To be completed by 11/23/13:  1.  Pt will answer questions from visual stimulus  of 3-5 minutes x 80% with min cues.   2.  Pt will recall a list of 20 words x 90% with self-cues. *Mastered  3.  Pt will demonstrate immediate recall of location of 4-8 objects x 90% with self-cues.  4.  Added 08/12/2013:  Pt will demonstrate ability to complete high-level cognitive tasks, such as deductive reasoning tasks, x 90% with min verbal cues and with auditory distraction.    Subjective:   Pain report:  [x]   [name] reported today.  []   Pain level  /10 today.  Location:      Subjective patient report:  New baseline information was taken today to increase the level of goals.  Deductive reasoning assessed now that pt has improved in memory.    Objective:  Objective Findings today:   STG 2: Mastered  LTG 4: Baseline: Visual deductive reasoning task (break a code of 4-colored pegs from a field of 6 within 10 trials with visual cues) completed with max verbal and visual cues.  No auditory distraction was  used today.  STG 5: Pt completed NPO program will results below:  Track 2: Barista  Task 2: Attributes and Groups    *One must be able to accurately analyze and compare attributes of the objects presented.  Based upon the analysis the objects must be placed in a grid so that rows, columns and corners have like attributes.  In level 4, pt must identify common attribute based on presentations.    Requirements for pass:  100% accuracy    Today's results:    Level:1, 1 and 2  # errors: 0, 0, 1  Pass: Yes, yes, no      Interventions Performed:   []  SLP provided pt with tactile cues:   []  SLP provided pt with visual cues   [x]  SLP provided pt with verbal cues - max  []  SLP provided pt with phonemic cues   []  Handouts on:   [x]  NeuroPsych Online: computer software focused on predetermined therapy protocol in which the patient is systematically presented with the tracks listed below in hierarchy contingent on completing prior to progression to higher level tasks.   []  Attention Skills   [x]  Executive Functioning  Skills   []  Memory Skills   []  Visualspatial Skills   []  Problem Solving Skills   []  Communication Skills  []    [x]  Other: Mastermind used for visual deductive reasoning.    Education/Home Exercise Program:    Patient was educated on attention and decision making.  Pt. demonstrated an understanding of this education.    []   No updates to home program today    [x]   HEP updated:  Handouts on identifying objects with three clues and following directions [facility] -2.  SLP instructed pt to complete half of each page through reading comprehension and half through auditory comprehension by having a partner read aloud  Assessment:   Pt continues to have difficulty with abstract identification, but increases with re-inforcement from SLP.   Patient presents with mild to moderate cognitive changes characterized by memory loss, difficulty with slow auditory comprehension and nonverbal memory tasks, difficulty with judgment and writing. Patient requires skilled therapy intervention to address Assessment: Impaired Memory, as well as the above mentioned deficits to maximize safety with daily activities and return to prior level of function.  Progress Toward Functional Goals:  STG 2 Mastered in stating and application    Patient Requires Continued Skilled Care to focus on visual memory in order to increased patient safety and independence.    Plan: Continue with established plan of care with focus on executive function.      Treatment Code # of Minutes   SLP Swallow Treatment [phone])    SLP Treatment Rudean Hitt [phone]) Time Calculation (min): 60 min             Therapist Signature:   Almer Bushey, MEd, Information systems manager Pathologist/Senior Therapist  [facility]  Pediatric and Adult Rehabilitation [email or web address]  08/12/2013

## 2013-08-13 NOTE — Progress Notes (Signed)
[facility]  Pediatric and Adult Rehabilitation [facility]  [address] [place]  [place] [address]  [phone]      Occupational Therapy Progress Report    PATIENT: Katherine [name]    Referred By: Veneda Melter, MD  DOB: 1950/07/26      Medical Record #: [id number]  AGE: 63 y.o.     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  Total Visits: 20    Precautions and Contraindications:  L hemi, fall risk, TIA, h/o DVT    Patient Goal:"To use my hand more."    Subjective: "My hand feels better and I do not have as many spasms with the splint."    Objective:  Pt presented alone to session wearing Saebo splint. Pt states she has been wearing the splint x1 hr/day and feels her fingers do slip out of splint.     Musculoskeletal Examination   Sessions have focused on the following activities:    Retrograde massage with lotion while pt supine to decrease edema     Scapula ROM to decrease tightness:  -scapular squeeze 15 reps in sitting  -shoulder shrugs 20 reps in sitting    AAROM gentle AROM to decrease felxion synergy and tightness:  -shoulder flexion to about 95* due to tightness; held at static stretch  -shoulder abduction to about 90* ROM; held at static stretch  -wrist extension with extended stretch held to decrease tone 10 reps  -finger extension all digits with stretch held  -static hold of UE in extension pattern to decrease tone    ROM with hemi-glide:  -shoulder flexion 10 reps with Min A  -shoulder abduction 10 reps with Min A  -D1/D2 10 reps with Min A for grip  -shoulder extension 10 reps     NMES to L wrist extensors x10 minutes up to level 29 on PPR 7. Pt tolerated well with positive finger extension in thumb and  all digits. Pt with active wrist extension noted today.    Kinesiotaping applied edema taping to back of hand with no tension and strips to each digit. Wrist extensor facilitation stretch with 50% tension. Pt with no discomfort at this time.    Pain: pain L shoulder with gentle ROM 3/10    Goal Status:  Short Term Goals: 6 weeks:   1. Pt will be independent in HEP consisting of proper positioning of L UE, edema management, ROM and fine motor exercises to increase functional movement and protect muscle integrity. (goal met)  2. Pt will demo increase of active movement of L UE by at least 10* all planes in order to increase functional independence with ADL's and daily tasks. (progressing)  3. Pt will be independent in don/doff and wearing schedule of Saebo Stretch splint in order to decrease tone of L UE and increase functional ROM. (goal met)  4. Pt will don/doff shirt/jacket in <30 seconds after set-up with Mod I and no cues in order to increase independence and performance with ADL's. (progressing)  Long Term Goals: 12 weeks:   1. Pt will demo increase of active movement of L UE by at least 20* all planes from initial evaluation in order to increase functional independence with ADL's  and daily tasks. (not met)  2. Pt will increase L UE fine motor skills required for ADL's and daily tasks through ability to perform 9-hole peg test. (not met)  3. Pt will demo increase in functional use of L UE and ability to participate in ADL's and IADLs through increase stroke impact overall recovery scale to >50%. (not assessed)  4. Pt will demo increase in functional use of L UE through ability to demo self-feeding and grooming tasks in appropriate timeframe. (progressing)  5. Pt will demo UB/LB dressing in sit/stand combo with use of hemi-technique and no cues for safety at Mod I level. (met)    Assessment:  Pt has met 2/4 STG's and 1/5 LTG's.  Pt has been fitted and began wearing the Saebo splint with instructions to slowly  increase wearing time in order to decrease flexion synergy and increase functional use of wrist and hand.  Pt has demonstrated good benefit from the splint and kinesiotaping at this time to increase facilitation of wrist extensors and decreased edema of hand.  Pt has been limited in increasing AROM of shoulder and UE due to pain and increased spasms at night; at this time spasms are becoming more controlled.  Sessions have focused on increasing AAROM, AROM, decreasing pain and beginning NMES of wrist and hand for functional movement.  Pt has shown great progress with use of NMES and will benefit from use of bioness in next certification period for increased functional and re-education exercises.  Pt is highly motivated during sessions and with HEP and continues to require skilled OT intervention due to decreased strength, coordination and fine motor skills of L UE limiting her independence with ADL's, functional mobility and IADL's.     Patient/Family Education: Patient educated on Saebo splint as stated above. Educated on kinesiotaping: pt should not feel pain, to remove if any redness/irritation/pain/blisters. Pt can shower with taping, no heat applied to surface. Pt should remove if any discomfort. To wear for 3-5 days. Educated on continued OT and updated POC.  Pt verbalized understanding of education.     Treatment Today:   Treatment Code # of Minutes Treatment Code # of Minutes   Therapeutic Exercise  Evaluation    Neuromuscular Re-Ed 60 Re-Evaluation    Manual Therapy      Therapeutic Activities              Plan:  Continue skilled therapy to address functional limitations through Therapeutic Activities, Gait training, Patient/Caregiver Education and Training, Therapeutic Exercise, Location manager, Neuromuscular Re-education, Functional Mobility Training, Home Exercise Program, Manual Therapy, and Electrical Stimulation.      Rehabilitation Potential: Good for goals    Frequency of treatment:  2 times per  week for 12 weeks.     Short Term Goals:   6 weeks  1. Pt will demo increase of active movement of L UE by at least 10* all planes in order to increase functional independence with ADL's and daily tasks.   2. Pt will don/doff shirt/jacket in <30 seconds after set-up with Mod I and no cues in order to increase independence and performance with ADL's.   3. Pt will tolerate full session of bioness paired with weight bearing and ROM activities in order to increase neuromuscular re-education and functional use of L UE.  4. Pt will demo increase in active movement of L UE by performing grasp/release tasks >75% accuracy in order to increase functional use for ADL and feeding tasks.    Long Term  Goals:  12 weeks   1. Pt will demo increase of active movement of L UE by at least 20* all planes from initial evaluation in order to increase functional independence with ADL's and daily tasks.  2. Pt will increase L UE fine motor skills required for ADL's and daily tasks through ability to perform 9-hole peg test.  3. Pt will demo increase in functional use of L UE and ability to participate in ADL's and IADLs through increase stroke impact overall recovery scale to >50%.   4. Pt will demo increase in functional use of L UE through ability to demo self-feeding and grooming tasks in appropriate timeframe.                                                      Luvenia Redden MS OTR/L  Occupational Therapist      I agree to above updated plan of care:    Physician's Signature: _______________________________      Date:

## 2013-08-16 ENCOUNTER — Ambulatory Visit: Payer: No Typology Code available for payment source

## 2013-08-16 DIAGNOSIS — I6350 Cerebral infarction due to unspecified occlusion or stenosis of unspecified cerebral artery: Secondary | ICD-10-CM

## 2013-08-16 DIAGNOSIS — R29898 Other symptoms and signs involving the musculoskeletal system: Secondary | ICD-10-CM

## 2013-08-16 NOTE — Progress Notes (Signed)
[facility]  Pediatric and Adult Rehabilitation [facility]  [address]  [place] [address]  [phone]    Occupational Therapy Daily Note    PATIENT: Katherine [name]   Referred By: Veneda Melter, MD  DOB: 02-16-1951     Medical Record #: [id number]  AGE: 63 y.o.    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  Visit: 21    Precautions and Contraindications:  L hemi, fall risk, TIA, h/o DVT    Subjective: "It's been a long weekend."    Objective: Pt presented alone to session.  Pt states she had a stressful weekend with family in town and did not get much rest.  Pt states her arm feels more tight today.  Pt states she has been wearing the splint x2 hr/day and continues with less intense spasms at night.    Neuromuscular Re-Education:   Retrograde massage with lotion while pt supine to decrease edema of L hand    Scapula ROM to decrease tightness in sidelying:  -shoulder shrugs 20 reps with Min facilitation  -shoulder abduction and flexion stretch to about 90* with less c/o pain versus supine    AAROM gentle AROM to decrease felxion synergy and tightness:  -wrist extension with extended stretch held to decrease tone 10 reps; in supination pattern  -finger extension all digits with stretch held  -static hold of UE in extension pattern to decrease tone  -shoulder abduction x10 reps with 3/10 c/o pain at slow rate  -pt unable to tolerate shoulder flexion reps due to pain    NMES to L wrist extensors x15 minutes up to level 31 on PPR 7.  Pt tolerated well with positive wrist extension.  NMES at level 41 to upper deltoid and scapula following subluxation setting to increase active movement and re-education for shoulder  and to decrease pain in shoulder.  Pt with positive muscle movement.    Kinesiotaping applied edema taping to back of hand with no tension and strips to each digit.  Wrist extensor facilitation stretch with 50% tension.  Pt with no discomfort at this time.  Addition of deltoid support in Y cut with 15% tension.    Pain: pain L shoulder with gentle ROM 3/10; flexion 5/10    Patient Education:  Patient educated on Saebo splint as stated above.  Educated on kinesiotaping: pt should not feel pain, to remove if any redness/irritation/pain/blisters. Pt can shower with taping, no heat applied to surface.  Pt should remove if any discomfort.  To wear for 3-5 days.  Pt verbalized understanding of education.    Home Exercise Program: Pt to focus on gentle stretch of UE and scapular strengthening. Pt is to return to cane and AROM exercises at this time due to improvement with spasms and shoulder pain last few sessions.   Pt is to increase wearing of Saebo splint to 2hrs/day until next session.  Pt agreed.     Assessment:   Pt with good benefit from NMES at wrist and shoulder; continues with pain in shoulder with slowed AROM gained.  Pt continues to require skilled OT intervention due to decreased strength, coordination and fine motor skills of L UE limiting her independence with ADL's,  functional mobility and IADL's.    Goal Status:  Progressing    Plan:  Patient will benefit from continued skilled therapeutic intervention to address the above functional goals - Continue plan of care with the following special instructions for next visit: continue with weight bearing tasks, re-test ROM  Goals:  Short Term Goals: 6 weeks  1. Pt will demo increase of active movement of L UE by at least 10* all planes in order to increase functional independence with ADL's and daily tasks.   2. Pt will don/doff shirt/jacket in <30 seconds after set-up with Mod I and no cues in order to increase independence and performance with ADL's.   3. Pt  will tolerate full session of bioness paired with weight bearing and ROM activities in order to increase neuromuscular re-education and functional use of L UE.  4. Pt will demo increase in active movement of L UE by performing grasp/release tasks >75% accuracy in order to increase functional use for ADL and feeding tasks.    Long Term Goals: 12 weeks   1. Pt will demo increase of active movement of L UE by at least 20* all planes from initial evaluation in order to increase functional independence with ADL's and daily tasks.  2. Pt will increase L UE fine motor skills required for ADL's and daily tasks through ability to perform 9-hole peg test.  3. Pt will demo increase in functional use of L UE and ability to participate in ADL's and IADLs through increase stroke impact overall recovery scale to >50%.   4. Pt will demo increase in functional use of L UE through ability to demo self-feeding and grooming tasks in appropriate timeframe.      Luvenia Redden MS OTR/L  Occupational Therapist

## 2013-08-18 ENCOUNTER — Ambulatory Visit: Payer: No Typology Code available for payment source

## 2013-08-19 ENCOUNTER — Ambulatory Visit: Payer: No Typology Code available for payment source

## 2013-08-19 DIAGNOSIS — I6350 Cerebral infarction due to unspecified occlusion or stenosis of unspecified cerebral artery: Secondary | ICD-10-CM

## 2013-08-19 DIAGNOSIS — R41844 Frontal lobe and executive function deficit: Secondary | ICD-10-CM

## 2013-08-19 DIAGNOSIS — R29898 Other symptoms and signs involving the musculoskeletal system: Secondary | ICD-10-CM

## 2013-08-19 NOTE — Progress Notes (Signed)
[facility]  Pediatric and Adult Rehabilitation [facility]  [address]  [place] [address]  [phone]    Occupational Therapy Daily Note    PATIENT: Katherine [name]   Referred By: Veneda Melter, MD  DOB: 03/23/1951     Medical Record #: [id number]  AGE: 63 y.o.    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  Visit: 22    Precautions and Contraindications:  L hemi, fall risk, TIA, h/o DVT    Subjective: "My shoulder felt great with the tape; I could do all my exercises!"    Objective: Pt presented alone to session.  Pt states she had great outcome with Kinesio Tape on shoulder as she did not have any shoulder pain and was able to complete her exercises.  Pt states she has been wearing the splint x2 hr/day and continues with less intense spasms at night.    Neuromuscular Re-Education:   Retrograde massage with lotion while pt supine to decrease edema of L hand    Scapula ROM to decrease tightness in sidelying:  -shoulder shrugs 20 reps with Min facilitation  -shoulder abduction and flexion stretch to about 90* with less c/o pain versus supine    AAROM gentle AROM to decrease felxion synergy and tightness:  -wrist extension with extended stretch held to decrease tone 10 reps; in supination pattern; pt with 2 flexion spasms during stretch  -finger extension all digits with stretch held  -static hold of UE in extension pattern to decrease tone  -shoulder abduction x10 reps with 1/10 c/o pain at slow rate  -shoulder flexion x10 reps with 3/10 c/o pain    Seated at EOM hemi-glide exercises:  -shoulder flexion with Min A 15 reps  -shoulder extension with Min A 15 reps  -D1/D2 pattern 15 reps each  -shoulder  abduction/adduction 15 reps    NMES to L wrist extensors x15 minutes up to level 31 on PPR 7.  Pt tolerated well with positive wrist extension.  NMES at level 31 to upper deltoid and scapula following subluxation setting to increase active movement and re-education for shoulder and to decrease pain in shoulder.  Pt with positive muscle movement.  Improved edema of hand after use of NMES.    Kinesiotaping applied edema taping to back of hand with no tension and strips to each digit.  Wrist extensor facilitation stretch with 50% tension.  Pt with no discomfort at this time.  Continued with addition of deltoid support in Y cut with 15% tension.    Pain: pain L shoulder with gentle ROM as stated above    Patient Education:  Patient educated on Saebo splint as stated above.  Educated on kinesiotaping: pt should not feel pain, to remove if any redness/irritation/pain/blisters. Pt can shower with taping, no heat applied to surface.  Pt should remove if any discomfort.  To wear for 3-5 days.  Pt verbalized understanding of education.    Home Exercise Program: Pt to focus on gentle stretch of UE and scapular strengthening. Pt is to return to cane and AROM exercises at this time due to improvement with spasms and shoulder pain last few sessions.   Pt is to increase wearing of Saebo splint  to 2hrs/day until next session.  Pt agreed.     Assessment:   Pt with great benefit of Kinesio Taping since last session reporting she was able to return to previous exercises and decreased pain of shoulder even lasting through today's session.  Pt shows good benefit from NMES at wrist and shoulder with less c/o shoulder pain and able to participate in exercises today.  Pt is scheduled to trial bioness for UE next session.  Pt continues to require skilled OT intervention due to decreased strength, coordination and fine motor skills of L UE limiting her independence with ADL's, functional mobility and IADL's.    Goal Status:   Progressing    Plan:  Patient will benefit from continued skilled therapeutic intervention to address the above functional goals - Continue plan of care with the following special instructions for next visit: continue with weight bearing tasks, re-test ROM  Goals:  Short Term Goals: 6 weeks  1. Pt will demo increase of active movement of L UE by at least 10* all planes in order to increase functional independence with ADL's and daily tasks.   2. Pt will don/doff shirt/jacket in <30 seconds after set-up with Mod I and no cues in order to increase independence and performance with ADL's.   3. Pt will tolerate full session of bioness paired with weight bearing and ROM activities in order to increase neuromuscular re-education and functional use of L UE.  4. Pt will demo increase in active movement of L UE by performing grasp/release tasks >75% accuracy in order to increase functional use for ADL and feeding tasks.    Long Term Goals: 12 weeks   1. Pt will demo increase of active movement of L UE by at least 20* all planes from initial evaluation in order to increase functional independence with ADL's and daily tasks.  2. Pt will increase L UE fine motor skills required for ADL's and daily tasks through ability to perform 9-hole peg test.  3. Pt will demo increase in functional use of L UE and ability to participate in ADL's and IADLs through increase stroke impact overall recovery scale to >50%.   4. Pt will demo increase in functional use of L UE through ability to demo self-feeding and grooming tasks in appropriate timeframe.      Luvenia Redden MS OTR/L  Occupational Therapist

## 2013-08-19 NOTE — Progress Notes (Signed)
[facility]  [address]  [place], [place]  [address]  Phone:  [phone]  Fax:  [phone]    Speech Therapy Daily Note    PATIENT: Katherine [name]    Referred By: Veneda Melter, MD  DOB:   1951/03/24                                 Medical Record #: [id number]  AGE:   63 y.o.                          Primary Diagnosis: CVA (cerebral infarction) [434.91]  Facility Provider #: [phone]   HICN# Patient is not covered by Medicare.    Treatment Diagnosis:  Executive Function/Memory Deficits 799.55    Visit #: SLP Visit  SLP Visit Number: 10  Visit Limit:  30 visits      Certification Period: 05/26/13 to 11/23/13     Time of treatment:   Time Calculation  SLP Received On: 08/19/2013  Start Time: 1400  Stop Time: 1500  Time Calculation (min): 60 min    Medications:  has a current medication list which includes the following prescription(s): atorvastatin, insulin aspart, insulin glargine, insulin syringe 1cc/29g, pramipexole, and rivaroxaban.    Patient/parent does not report changes to medication at this time.    Patient Goal:  Patient Goal: To improve cognition, return to work and use a computer for return to work.    Therapy Goals:  (Copied from Evaluation)  Goal status updated today.    Short Term Goals:   To be completed by 08/23/13:  1.  Pt will complete Test of Memory and Learning x 100%. *Mastered  2.  Pt will recall memory strategies x 100% I.  *Mastered  3.  Pt will demonstrate use of memory strategies to organize a list of 20 words for recall x 80% with min cues.  *Mastered  4.  Added 07/07/13: Pt will complete convergent naming activities with concrete and abstract items x 90% I.  5.  Added 07/07/13: Pt will complete executive function tasks of reasoning, decision making and self-correction x 90% I using requirements per NeuroPsych Online.     Long Term Goals:  To be completed by 11/23/13:  1.  Pt will answer questions from visual stimulus  of 3-5 minutes x 80% with min cues.   2.  Pt will recall a list of 20 words x 90% with self-cues. *Mastered  3.  Pt will demonstrate immediate recall of location of 4-8 objects x 90% with self-cues.  4.  Added 08/12/2013:  Pt will demonstrate ability to complete high-level cognitive tasks, such as deductive reasoning tasks, x 90% with min verbal cues and with auditory distraction.    Subjective:   Pain report:  [x]   [name] reported today.  []   Pain level  /10 today.  Location:      Subjective patient report:  New baseline information was taken today to increase the level of goals.  Deductive reasoning assessed now that pt has improved in memory.    Objective:  Objective Findings today:   STG 2: Mastered  LTG 4: Baseline: Visual deductive reasoning task (break a code of 4-colored pegs from a field of 6 within 10 trials with visual cues) completed with max verbal and visual cues.  No auditory distraction was  used today.  STG 5: Pt completed NPO program will results below:  Track 2: Barista  Task 2: Attributes and Groups    *One must be able to accurately analyze and compare attributes of the objects presented.  Based upon the analysis the objects must be placed in a grid so that rows, columns and corners have like attributes.  In level 4, pt must identify common attribute based on presentations.    Requirements for pass:  100% accuracy    Today's results:    Level:1, 1 and 2  # errors: 0, 0, 1  Pass: Yes, yes, no      Interventions Performed:   []  SLP provided pt with tactile cues:   []  SLP provided pt with visual cues   [x]  SLP provided pt with verbal cues - max  []  SLP provided pt with phonemic cues   []  Handouts on:   [x]  NeuroPsych Online: computer software focused on predetermined therapy protocol in which the patient is systematically presented with the tracks listed below in hierarchy contingent on completing prior to progression to higher level tasks.   []  Attention Skills   [x]  Executive Functioning  Skills   []  Memory Skills   []  Visualspatial Skills   []  Problem Solving Skills   []  Communication Skills  []    [x]  Other: Mastermind used for Biochemist, clinical.    Education/Home Exercise Program:    Patient was educated on attention and decision making.  Pt. demonstrated an understanding of this education.    []   No updates to home program today    [x]   HEP updated:  Handouts on identifying objects with three clues and following directions [facility] -2.  SLP instructed pt to complete half of each page through reading comprehension and half through auditory comprehension by having a partner read aloud  Assessment:   Pt continues to have difficulty with abstract identification, but increases with re-inforcement from SLP.   Patient presents with mild to moderate cognitive changes characterized by memory loss, difficulty with slow auditory comprehension and nonverbal memory tasks, difficulty with judgment and writing. Patient requires skilled therapy intervention to address Assessment: Impaired Memory, as well as the above mentioned deficits to maximize safety with daily activities and return to prior level of function.  Progress Toward Functional Goals:  STG 2 Mastered in stating and application    Patient Requires Continued Skilled Care to focus on visual memory in order to increased patient safety and independence.    Plan: Continue with established plan of care with focus on executive function.      Treatment Code # of Minutes   SLP Swallow Treatment [phone])    SLP Treatment Rudean Hitt [phone]) Time Calculation (min): 60 min             Therapist Signature:   Jarnell Cordaro, MEd, Information systems manager Pathologist/Senior Therapist  [facility]  Pediatric and Adult Rehabilitation [facility][email or web address]  08/19/2013

## 2013-08-25 ENCOUNTER — Ambulatory Visit: Payer: No Typology Code available for payment source

## 2013-08-25 DIAGNOSIS — R29898 Other symptoms and signs involving the musculoskeletal system: Secondary | ICD-10-CM

## 2013-08-25 DIAGNOSIS — R29818 Other symptoms and signs involving the nervous system: Secondary | ICD-10-CM

## 2013-08-25 DIAGNOSIS — I6350 Cerebral infarction due to unspecified occlusion or stenosis of unspecified cerebral artery: Secondary | ICD-10-CM

## 2013-08-25 NOTE — Progress Notes (Signed)
[facility]  Pediatric and Adult Rehabilitation [facility]  [address]  [place] [address]  [phone]    Occupational Therapy Daily Note    PATIENT: Katherine [name]   Referred By: Veneda Melter, MD  DOB: Dec 11, 1950     Medical Record #: [id number]  AGE: 63 y.o.    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  Visit: 23    Precautions and Contraindications:  L hemi, fall risk, TIA, h/o DVT    Subjective: "I've been moving really slow today."    Objective: Pt presented alone to session.  Pt presents with edema glove donned and edema of hand.  Bioness Rep present for session.    Neuromuscular Re-Education:   Session focused on use of Bioness to decrease spasticity, increase neuromuscular re-ed and function flexion/extension of L UE to perform daily tasks.   Pt placed on Phase of 9, extension setting on 6 and flexion on 3. Performed following functional tasks with Bioness donned entire session:  - active finger flexion/extension with open exercise setting of Bioness  - supine shoulder flexion Min A timed with extension of hand from Bioness and functional reach of UE  - supine D1/D2 pattern Min A timed with extension of hand from Bioness  - seated at EOM functional weight bearing onto hand timed during extension of Bioness and Min A    Kinesiotaping applied edema taping to back of hand with no tension and strips to each digit.  Wrist extensor facilitation stretch with 50% tension.  Pt with no discomfort at this time.      Pain: improved as compared to last sessions    Patient Education:  Patient educated on use of Bioness during sessions.  Educated on kinesiotaping: pt should not feel pain, to remove if any  redness/irritation/pain/blisters. Pt can shower with taping, no heat applied to surface.  Pt should remove if any discomfort.  To wear for 3-5 days.  Pt verbalized understanding of education.    Home Exercise Program: Pt to focus on gentle stretch of UE and scapular strengthening. Pt is to return to cane and AROM exercises at this time due to improvement with spasms and shoulder pain last few sessions.   Pt is to increase wearing of Saebo splint to 2hrs/day until next session.  Pt agreed.     Assessment:   Pt with good outcome from use of Bioness with improved edema, decreased spasticity through hand and wrist and improved extension through hand.  Pt able to perform 10 AROM wrist extensors without assist at end of session.  Pt will benefit from continued use of Bioness in sessions paired with ongoing functional tasks.  Pt continues to require skilled OT intervention due to decreased strength, coordination and fine motor skills of L UE limiting her independence with ADL's, functional mobility and IADL's.    Goal Status:  Progressing    Plan:  Patient will benefit from continued skilled therapeutic intervention to address the above functional goals - Continue plan of care with the following special instructions for next visit: continue with weight bearing tasks, re-test ROM  Goals:  Short Term Goals: 6 weeks  1. Pt will demo increase of active movement of L UE  by at least 10* all planes in order to increase functional independence with ADL's and daily tasks.   2. Pt will don/doff shirt/jacket in <30 seconds after set-up with Mod I and no cues in order to increase independence and performance with ADL's.   3. Pt will tolerate full session of bioness paired with weight bearing and ROM activities in order to increase neuromuscular re-education and functional use of L UE.  4. Pt will demo increase in active movement of L UE by performing grasp/release tasks >75% accuracy in order to increase functional use for ADL and  feeding tasks.    Long Term Goals: 12 weeks   1. Pt will demo increase of active movement of L UE by at least 20* all planes from initial evaluation in order to increase functional independence with ADL's and daily tasks.  2. Pt will increase L UE fine motor skills required for ADL's and daily tasks through ability to perform 9-hole peg test.  3. Pt will demo increase in functional use of L UE and ability to participate in ADL's and IADLs through increase stroke impact overall recovery scale to >50%.   4. Pt will demo increase in functional use of L UE through ability to demo self-feeding and grooming tasks in appropriate timeframe.      Luvenia Redden MS OTR/L  Occupational Therapist

## 2013-08-26 ENCOUNTER — Ambulatory Visit: Payer: No Typology Code available for payment source

## 2013-08-26 DIAGNOSIS — R41841 Cognitive communication deficit: Secondary | ICD-10-CM

## 2013-08-26 DIAGNOSIS — R29898 Other symptoms and signs involving the musculoskeletal system: Secondary | ICD-10-CM

## 2013-08-26 DIAGNOSIS — I6350 Cerebral infarction due to unspecified occlusion or stenosis of unspecified cerebral artery: Secondary | ICD-10-CM

## 2013-08-26 NOTE — Progress Notes (Signed)
[facility]  Pediatric and Adult Rehabilitation [facility]  [address]  [place] [address]  [phone]    Occupational Therapy Daily Note    PATIENT: Katherine [name]   Referred By: Veneda Melter, MD  DOB: [birth year]     Medical Record #: [id number]  AGE: 63 y.o.    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  Visit: 24    Precautions and Contraindications:  L hemi, fall risk, TIA, h/o DVT    Subjective: "My arm feel better."    Objective: Pt presented alone to session.  Pt presents with Kinesio Taping from previous day.  States she is wearing the Saebo splint 6hrs/night and it continues to help with her flexion spasms at night.    Neuromuscular Re-Education:   Session focused on use of Bioness to decrease spasticity, increase neuromuscular re-ed and function flexion/extension of L UE to perform daily tasks.   Pt placed on Phase of 9, extension setting on 6 and flexion on 3. Performed following functional tasks with Bioness donned on open exercise setting:  - active finger flexion/extension with  Bilateral hands and coordination with alternating open/close  - supine shoulder flexion Min A timed with extension of hand from Bioness and functional reach of UE  - supine D1/D2 pattern Min A timed with extension of hand from Bioness  - seated at EOM functional weight bearing onto hand timed during extension of Bioness and Min A    With Bioness in grasp/release setting x10 minutes:  - grasp and release of cup timed with Bioness with Min A  - grasp and release of washcloth with CGA    Kinesiotaping applied for wrist extensor facilitation stretch with 50% tension.  Pt with no discomfort at this time.      Pain: improved  as compared to last sessions    Patient Education:  Patient educated on discussing muscle relaxer or botox with MD at next appt next week as current muscle relaxer is not helping pt.  Pt agreed.  Patient educated on use of Bioness during sessions.  Educated on kinesiotaping: pt should not feel pain, to remove if any redness/irritation/pain/blisters. Pt can shower with taping, no heat applied to surface.  Pt should remove if any discomfort.  To wear for 3-5 days.  Pt verbalized understanding of education.    Home Exercise Program: Pt to focus on gentle stretch of UE and scapular strengthening. Pt is to return to cane and AROM exercises at this time due to improvement with spasms and shoulder pain last few sessions.   Pt is to increase wearing of Saebo splint to 2hrs/day until next session.  Pt agreed.     Assessment:   Pt with good outcome from use of Bioness with improved edema, decreased spasticity through hand and wrist and improved extension through hand.  Pt able to perform 20 AROM wrist extensors without assist at end of session and performed 10 prior to using Bioness; pt did not improve with AROM of flexion/extension of hand.  Pt will benefit from continued use of Bioness in sessions paired with ongoing functional tasks.  Pt continues to require skilled OT intervention due to decreased strength, coordination and fine motor skills of  L UE limiting her independence with ADL's, functional mobility and IADL's.    Goal Status:  Progressing    Plan:  Patient will benefit from continued skilled therapeutic intervention to address the above functional goals - Continue plan of care with the following special instructions for next visit: continue with weight bearing tasks, re-test ROM  Goals:  Short Term Goals: 6 weeks  1. Pt will demo increase of active movement of L UE by at least 10* all planes in order to increase functional independence with ADL's and daily tasks.   2. Pt will don/doff shirt/jacket in <30 seconds  after set-up with Mod I and no cues in order to increase independence and performance with ADL's.   3. Pt will tolerate full session of bioness paired with weight bearing and ROM activities in order to increase neuromuscular re-education and functional use of L UE.  4. Pt will demo increase in active movement of L UE by performing grasp/release tasks >75% accuracy in order to increase functional use for ADL and feeding tasks.    Long Term Goals: 12 weeks   1. Pt will demo increase of active movement of L UE by at least 20* all planes from initial evaluation in order to increase functional independence with ADL's and daily tasks.  2. Pt will increase L UE fine motor skills required for ADL's and daily tasks through ability to perform 9-hole peg test.  3. Pt will demo increase in functional use of L UE and ability to participate in ADL's and IADLs through increase stroke impact overall recovery scale to >50%.   4. Pt will demo increase in functional use of L UE through ability to demo self-feeding and grooming tasks in appropriate timeframe.      Luvenia Redden MS OTR/L  Occupational Therapist

## 2013-08-26 NOTE — Progress Notes (Signed)
[facility]  [address]  [place], [place]  [address]  Phone:  [phone]  Fax:  [phone]    Speech Therapy Daily Note    PATIENT: Katherine [name]    Referred By: Veneda Melter, MD  DOB:   Jul 08, 1950                                 Medical Record #: [id number]  AGE:   63 y.o.                          Primary Diagnosis: CVA (cerebral infarction) [434.91]  Facility Provider #: [phone]   HICN# Patient is not covered by Medicare.    Treatment Diagnosis:  Executive Function/Memory Deficits 799.55    Visit #: SLP Visit  SLP Visit Number: 10  Visit Limit:  30 visits      Certification Period: 05/26/13 to 11/23/13     Time of treatment:   Time Calculation  SLP Received On: 08/19/2013  Start Time: 1400  Stop Time: 1500  Time Calculation (min): 60 min    Medications:  has a current medication list which includes the following prescription(s): atorvastatin, insulin aspart, insulin glargine, insulin syringe 1cc/29g, pramipexole, and rivaroxaban.    Patient/parent does not report changes to medication at this time.    Patient Goal:  Patient Goal: To improve cognition, return to work and use a computer for return to work.    Therapy Goals:  (Copied from Evaluation)  Goal status updated today.    Short Term Goals:   To be completed by 08/23/13:  1.  Pt will complete Test of Memory and Learning x 100%. *Mastered  2.  Pt will recall memory strategies x 100% I.  *Mastered  3.  Pt will demonstrate use of memory strategies to organize a list of 20 words for recall x 80% with min cues.  *Mastered  4.  Added 07/07/13: Pt will complete convergent naming activities with concrete and abstract items x 90% I.  5.  Added 07/07/13: Pt will complete executive function tasks of reasoning, decision making and self-correction x 90% I using requirements per NeuroPsych Online.     Long Term Goals:  To be completed by 11/23/13:  1.  Pt will answer questions from visual stimulus  of 3-5 minutes x 80% with min cues.   2.  Pt will recall a list of 20 words x 90% with self-cues. *Mastered  3.  Pt will demonstrate immediate recall of location of 4-8 objects x 90% with self-cues.  4.  Added 08/12/2013:  Pt will demonstrate ability to complete high-level cognitive tasks, such as deductive reasoning tasks, x 90% with min verbal cues and with auditory distraction.    Subjective:   Pain report:  [x]   [name] reported today.  []   Pain level  /10 today.  Location:      Subjective patient report:  New baseline information was taken today to increase the level of goals.  Deductive reasoning assessed now that pt has improved in memory.    Objective:  Objective Findings today:   STG 2: Mastered  LTG 4: Baseline: Visual deductive reasoning task (break a code of 4-colored pegs from a field of 6 within 10 trials with visual cues) incomplete with mod verbal and visual distraction.    STG 5:  Pt completed NPO program will results below:  Track 2: Executive Skills  Task 2: Attributes and Groups    *One must be able to accurately analyze and compare attributes of the objects presented.  Based upon the analysis the objects must be placed in a grid so that rows, columns and corners have like attributes.  In level 4, pt must identify common attribute based on presentations.    Requirements for pass:  100% accuracy    Today's results:    Level:1, 1 and 2  # errors: 0, 0, 1  Pass: Yes, yes, no      Interventions Performed:   []  SLP provided pt with tactile cues:   []  SLP provided pt with visual cues   [x]  SLP provided pt with verbal cues - max  []  SLP provided pt with phonemic cues   []  Handouts on:   [x]  NeuroPsych Online: computer software focused on predetermined therapy protocol in which the patient is systematically presented with the tracks listed below in hierarchy contingent on completing prior to progression to higher level tasks.   []  Attention Skills   [x]  Executive Functioning Skills   []  Memory Skills   []   Visualspatial Skills   []  Problem Solving Skills   []  Communication Skills  []    [x]  Other: Mastermind used for visual deductive reasoning.  Visual and auditory distractions provided.    Education/Home Exercise Program:    Patient was educated on attention and decision making.  Pt. demonstrated an understanding of this education.    []   No updates to home program today    [x]   HEP updated:  Handouts on identifying objects with three clues and following directions [facility] -2.  SLP instructed pt to complete half of each page through reading comprehension and half through auditory comprehension by having a partner read aloud  Assessment:   Pt continues to have difficulty with auditory and visual distraction, which would create difficulty for her to return to work.      Patient presents with mild to moderate cognitive changes characterized by memory loss, difficulty with slow auditory comprehension and nonverbal memory tasks, difficulty with judgment and writing. Patient requires skilled therapy intervention to address Assessment: Impaired Memory, as well as the above mentioned deficits to maximize safety with daily activities and return to prior level of function.  Progress Toward Functional Goals:  STG 2 Mastered in stating and application    Patient Requires Continued Skilled Care to focus on visual memory in order to increased patient safety and independence.    Plan: Continue with established plan of care with focus on executive function.      Treatment Code # of Minutes   SLP Swallow Treatment [phone])    SLP Treatment Rudean Hitt [phone]) Time Calculation (min): 60 min             Therapist Signature:   Jaimya Feliciano, MEd, Information systems manager Pathologist/Senior Therapist  [facility]  Pediatric and Adult Rehabilitation [email or web address]  08/26/2013

## 2013-08-30 ENCOUNTER — Ambulatory Visit: Payer: No Typology Code available for payment source

## 2013-08-30 DIAGNOSIS — I69998 Other sequelae following unspecified cerebrovascular disease: Secondary | ICD-10-CM | POA: Insufficient documentation

## 2013-08-30 DIAGNOSIS — R29898 Other symptoms and signs involving the musculoskeletal system: Secondary | ICD-10-CM | POA: Insufficient documentation

## 2013-09-02 ENCOUNTER — Ambulatory Visit: Payer: No Typology Code available for payment source

## 2013-09-02 DIAGNOSIS — R29898 Other symptoms and signs involving the musculoskeletal system: Secondary | ICD-10-CM

## 2013-09-02 DIAGNOSIS — I6350 Cerebral infarction due to unspecified occlusion or stenosis of unspecified cerebral artery: Secondary | ICD-10-CM

## 2013-09-02 DIAGNOSIS — R41844 Frontal lobe and executive function deficit: Secondary | ICD-10-CM

## 2013-09-02 NOTE — Progress Notes (Signed)
[facility]  Pediatric and Adult Rehabilitation [facility]  [address]  [place] [address]  [phone]    Occupational Therapy Daily Note    PATIENT: Katherine [name]   Referred By: Veneda Melter, MD  DOB: Aug 19, 1950     Medical Record #: [id number]  AGE: 62 y.o.    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  Visit: 25    Precautions and Contraindications:  L hemi, fall risk, TIA, h/o DVT    Subjective: "The spasms are worse again."    Objective: Pt presented alone to session.  Pt states spasms at night have been worse again; fingers unable to stay in Saebo splint.  Pt scheduled to see her MD next week on Tues and will discuss Baclofen or Botox.      Neuromuscular Re-Education:   Session focused on use of Bioness to decrease spasticity, increase neuromuscular re-ed and function flexion/extension of L UE to perform daily tasks.   Pt placed on Phase of 9, extension setting on 8 and flexion on 3; extension increased due to increase of spasticity today. Performed following functional tasks with Bioness donned on open exercise setting x40 minutes:  - active finger flexion/extension with  Bilateral hands and coordination with alternating open/close  - seated shoulder flexion Min A timed with extension of hand from Bioness and functional reach of UE  - seated D1/D2 pattern Min A timed with extension of hand from Bioness  - seated at EOM functional weight bearing onto hand timed during extension of Bioness and Min A  -seated forward reach with Min A for L UE; reaching timed with extension setting of Bioness  -open fast setting x3 minutes to decrease spasticity  -UE movement paired with gross motor balance and  coordination: side step lunges with UE extension with Mod A for UE x10 reps; squat with UE reach with Mod A for UE 15 reps; lateral walking with UE extension x15 reps R/L with Mod A for UE    Kinesiotaping applied for wrist extensor facilitation stretch with 50% tension and edema of hand.  Pt with no discomfort at this time.      Pain: improved as compared to last sessions    Patient Education:  Patient educated on returning to PT for further balance and mobility training; pt agreed and to obtain script from MD next week.  Educated on kinesiotaping: pt should not feel pain, to remove if any redness/irritation/pain/blisters. Pt can shower with taping, no heat applied to surface.  Pt should remove if any discomfort.  To wear for 3-5 days.  Pt verbalized understanding of education.    Home Exercise Program: Pt to focus on gentle stretch of UE and scapular strengthening. Pt is to return to cane and AROM exercises at this time due to improvement with spasms and shoulder pain last few sessions.   Pt is to increase wearing of Saebo splint to 2hrs/day until next session.  Pt agreed.     Assessment:   Pt with good outcome from use of Bioness with improved edema, decreased spasticity through hand and wrist and improved ROM of hand and wrist.  Pt will benefit from continued use of Bioness in sessions paired with ongoing  functional tasks especially balance and mobility tasks.  Pt continues to require skilled OT intervention due to decreased strength, coordination and fine motor skills of L UE limiting her independence with ADL's, functional mobility and IADL's.    Goal Status:  Progressing    Plan:  Patient will benefit from continued skilled therapeutic intervention to address the above functional goals - Continue plan of care with the following special instructions for next visit: continue with weight bearing tasks, re-test ROM  Goals:  Short Term Goals: 6 weeks  1. Pt will demo increase of active movement of L UE by at least  10* all planes in order to increase functional independence with ADL's and daily tasks.   2. Pt will don/doff shirt/jacket in <30 seconds after set-up with Mod I and no cues in order to increase independence and performance with ADL's.   3. Pt will tolerate full session of bioness paired with weight bearing and ROM activities in order to increase neuromuscular re-education and functional use of L UE.  4. Pt will demo increase in active movement of L UE by performing grasp/release tasks >75% accuracy in order to increase functional use for ADL and feeding tasks.    Long Term Goals: 12 weeks   1. Pt will demo increase of active movement of L UE by at least 20* all planes from initial evaluation in order to increase functional independence with ADL's and daily tasks.  2. Pt will increase L UE fine motor skills required for ADL's and daily tasks through ability to perform 9-hole peg test.  3. Pt will demo increase in functional use of L UE and ability to participate in ADL's and IADLs through increase stroke impact overall recovery scale to >50%.   4. Pt will demo increase in functional use of L UE through ability to demo self-feeding and grooming tasks in appropriate timeframe.      Luvenia Redden MS OTR/L  Occupational Therapist

## 2013-09-02 NOTE — Progress Notes (Signed)
[facility]  [address], [address]  [place], [place]  [address]  Phone:  [phone]  Fax:  [phone]    Speech Therapy Daily Note    PATIENT: Katherine [name]    Referred By: Veneda Melter, MD  DOB:   05-11-1950                                 Medical Record #: [id number]  AGE:   63 y.o.                          Primary Diagnosis: CVA (cerebral infarction) [434.91]  Facility Provider #: [phone]   HICN# Patient is not covered by Medicare.    Treatment Diagnosis:  Executive Function/Memory Deficits 799.55    Visit #: SLP Visit  SLP Visit Number: 11  Visit Limit:  30 visits      Certification Period: 05/26/13 to 11/23/13     Time of treatment:   Time Calculation  SLP Received On: 09/02/2013  Start Time: 1400  Stop Time: 1500  Time Calculation (min): 60 min    Medications:  has a current medication list which includes the following prescription(s): atorvastatin, insulin aspart, insulin glargine, insulin syringe 1cc/29g, pramipexole, and rivaroxaban.    Patient/parent does not report changes to medication at this time.    Patient Goal:  Patient Goal: To improve cognition, return to work and use a computer for return to work.    Therapy Goals:  (Copied from Evaluation)  Goal status updated today.    Short Term Goals:   To be completed by 08/23/13:  1.  Pt will complete Test of Memory and Learning x 100%. *Mastered  2.  Pt will recall memory strategies x 100% I.  *Mastered  3.  Pt will demonstrate use of memory strategies to organize a list of 20 words for recall x 80% with min cues.  *Mastered  4.  Added 07/07/13: Pt will complete convergent naming activities with concrete and abstract items x 90% I.  5.  Added 07/07/13: Pt will complete executive function tasks of reasoning, decision making and self-correction x 90% I using requirements per NeuroPsych Online.     Long Term Goals:  To be completed by 11/23/13:  1.  Pt will answer questions from visual stimulus  of 3-5 minutes x 80% with min cues.   2.  Pt will recall a list of 20 words x 90% with self-cues. *Mastered  3.  Pt will demonstrate immediate recall of location of 4-8 objects x 90% with self-cues.  4.  Added 08/12/2013:  Pt will demonstrate ability to complete high-level cognitive tasks, such as deductive reasoning tasks, x 90% with min verbal cues and with auditory distraction.    Subjective:   Pain report:  [x]   [name] reported today.  []   Pain level  /10 today.  Location:      Subjective patient report:  Pt discussed distractions from family and phone calls during her work on [employer].    Objective:  Objective Findings today:   STG 4: Goal met.   LTG 1: Pt completed x 90% independently.  LTG 4:  Track 5: Problem Solving Skills  Task 1: Deduce It    *Pt to analyze the written clues provided to deduce the numbers represented (1-3 digits, increasing in complexity by level.    **  Requirements: 100% accuracy at all levels out of 5 presentations per task.    Level: 3  # correct: 4  Pass: No  *Mod verbal cues from SLP      Interventions Performed:   []  SLP provided pt with tactile cues:   []  SLP provided pt with visual cues   [x]  SLP provided pt with verbal cues - mod  []  SLP provided pt with phonemic cues   []  Handouts on:   [x]  NeuroPsych Online: computer software focused on predetermined therapy protocol in which the patient is systematically presented with the tracks listed below in hierarchy contingent on completing prior to progression to higher level tasks.   []  Attention Skills   []  Executive Functioning Skills   []  Memory Skills   []  Visualspatial Skills   [x]  Problem Solving Skills   []  Communication Skills  [x]    Photo Cue Cards: Everyday activities.  SLP had pt view each card. for 1 minute then answer 1 question on each card.  []  Other: Mastermind used for visual deductive reasoning.  Visual and auditory distractions provided.    Education/Home Exercise Program:    Patient was educated on problem  solving being a combination of skills from memory, executive function and attention.  Pt. demonstrated an understanding of this education.    []   No updates to home program today    [x]   HEP updated:  Updated NPO program    Assessment:   Pt continues to have difficulty with auditory and visual distraction, which would create difficulty for her to return to work.      Patient presents with mild to moderate cognitive changes characterized by memory loss, difficulty with slow auditory comprehension and nonverbal memory tasks, difficulty with judgment and writing. Patient requires skilled therapy intervention to address Assessment: Impaired Memory, as well as the above mentioned deficits to maximize safety with daily activities and return to prior level of function.  Progress Toward Functional Goals: all LTGs     Patient Requires Continued Skilled Care to focus on visual memory in order to increased patient safety and independence.    Plan: Continue with established plan of care with focus on executive function.      Treatment Code # of Minutes   SLP Swallow Treatment [phone])    SLP Treatment Rudean Hitt [phone]) Time Calculation (min): 60 min             Therapist Signature:   Ifeoma Vallin, MEd, Information systems manager Pathologist/Senior Therapist  [facility]  Pediatric and Adult Rehabilitation [email or web address]  09/02/2013

## 2013-09-06 ENCOUNTER — Ambulatory Visit: Payer: No Typology Code available for payment source

## 2013-09-06 DIAGNOSIS — R29898 Other symptoms and signs involving the musculoskeletal system: Secondary | ICD-10-CM

## 2013-09-06 DIAGNOSIS — I6350 Cerebral infarction due to unspecified occlusion or stenosis of unspecified cerebral artery: Secondary | ICD-10-CM

## 2013-09-06 NOTE — Progress Notes (Signed)
[facility]  Pediatric and Adult Rehabilitation [facility]  [address]  [place] [address]  [phone]    Occupational Therapy Daily Note    PATIENT: Katherine [name]   Referred By: Veneda Melter, MD  DOB: 09-20-1950     Medical Record #: [id number]  AGE: 63 y.o.    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  Visit: 26    Precautions and Contraindications:  L hemi, fall risk, TIA, h/o DVT    Subjective: "My arm hurts so much from the spasms, but I could open my hand all the way after the last session."    Objective: Pt presented alone to session.  Pt states she saw her neurologist this am and is slowly increasing the current muscle relaxer so she will be taking 3 doses/day.  Pt is to return in 2 weeks; if not positive change MD will further consider botox for UE.    Neuromuscular Re-Education:   Session focused on use of Bioness to decrease spasticity, increase neuromuscular re-ed and function flexion/extension of L UE to perform daily tasks.   Pt placed on Phase of 9, extension setting on 8 and flexion on 3; continued with extension increased due to increase of spasticity today. Performed following functional tasks with Bioness donned on open exercise setting x40 minutes:  - active finger flexion/extension with  Bilateral hands and coordination with alternating open/close  - seated shoulder flexion Min A timed with extension of hand from Bioness and functional reach of UE  - seated D1/D2 pattern Min A timed with extension of hand from Bioness with increased spacticity of shoulder today  - seated at EOM functional weight bearing onto hand timed during extension of Bioness and Min A; progressed to weight bearing  onto elbow then to hand x10 reps    Kinesiotaping applied for wrist extensor facilitation stretch with 50% tension and edema of hand.  Addition of shoulder taping to increase support of shoulder and external rotation for improved posture.  Pt with no discomfort at this time.      Pain: 4/10 L shoulder with ROM    Patient Education:   Educated on kinesiotaping: pt should not feel pain, to remove if any redness/irritation/pain/blisters. Pt can shower with taping, no heat applied to surface.  Pt should remove if any discomfort.  To wear for 3-5 days.  Pt verbalized understanding of education.    Home Exercise Program: Pt to focus on gentle stretch of UE and scapular strengthening. Pt is to continue with cane and AROM exercises and to increase wearing of Saebo splint to 6hrs/day.  Pt agreed.     Assessment:   Pt with good outcome from use of Bioness with improved edema, decreased spasticity through hand and wrist and improved ROM of hand and wrist.  At start of session pt able to demo AROM wrist extension minimally for 4 reps and at end of session pt able to complete 10 reps with increased active movement of all digits.  Pt will benefit from continued use of Bioness in sessions paired with ongoing functional tasks especially balance and mobility tasks.  Pt continues to require skilled OT intervention due to decreased strength, coordination and fine motor skills  of L UE limiting her independence with ADL's, functional mobility and IADL's.    Goal Status:  Progressing    Plan:  Patient will benefit from continued skilled therapeutic intervention to address the above functional goals - Continue plan of care with the following special instructions for next visit: continue with weight bearing tasks, re-test ROM  Goals:  Short Term Goals: 6 weeks  1. Pt will demo increase of active movement of L UE by at least 10* all planes in order to increase functional independence with ADL's and daily tasks.   2. Pt will don/doff  shirt/jacket in <30 seconds after set-up with Mod I and no cues in order to increase independence and performance with ADL's.   3. Pt will tolerate full session of bioness paired with weight bearing and ROM activities in order to increase neuromuscular re-education and functional use of L UE.  4. Pt will demo increase in active movement of L UE by performing grasp/release tasks >75% accuracy in order to increase functional use for ADL and feeding tasks.    Long Term Goals: 12 weeks   1. Pt will demo increase of active movement of L UE by at least 20* all planes from initial evaluation in order to increase functional independence with ADL's and daily tasks.  2. Pt will increase L UE fine motor skills required for ADL's and daily tasks through ability to perform 9-hole peg test.  3. Pt will demo increase in functional use of L UE and ability to participate in ADL's and IADLs through increase stroke impact overall recovery scale to >50%.   4. Pt will demo increase in functional use of L UE through ability to demo self-feeding and grooming tasks in appropriate timeframe.      Luvenia Redden MS OTR/L  Occupational Therapist

## 2013-09-09 ENCOUNTER — Ambulatory Visit: Payer: No Typology Code available for payment source

## 2013-09-09 LAB — ECG 12-LEAD
Atrial Rate: 81 {beats}/min
P Axis: 77 degrees
P-R Interval: 174 ms
Q-T Interval: 372 ms
QRS Duration: 72 ms
QTC Calculation (Bezet): 432 ms
R Axis: 30 degrees
T Axis: 38 degrees
Ventricular Rate: 81 {beats}/min

## 2013-09-13 ENCOUNTER — Ambulatory Visit: Payer: No Typology Code available for payment source

## 2013-09-16 ENCOUNTER — Ambulatory Visit: Payer: No Typology Code available for payment source

## 2013-09-16 DIAGNOSIS — I6350 Cerebral infarction due to unspecified occlusion or stenosis of unspecified cerebral artery: Secondary | ICD-10-CM

## 2013-09-16 DIAGNOSIS — R279 Unspecified lack of coordination: Secondary | ICD-10-CM

## 2013-09-16 DIAGNOSIS — R29898 Other symptoms and signs involving the musculoskeletal system: Secondary | ICD-10-CM

## 2013-09-16 NOTE — Progress Notes (Signed)
[facility]  Pediatric and Adult Rehabilitation [facility]  [address]  [place] [address]  [phone]    Occupational Therapy Daily Note    PATIENT: Katherine [name]   Referred By: Veneda Melter, MD  DOB: 06-Apr-1950     Medical Record #: [id number]  AGE: 63 y.o.    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  Visit: 27    Precautions and Contraindications:  L hemi, fall risk, TIA, h/o DVT    Subjective: "I'm still having spasms; even after increasing the meds."    Objective: Pt presented alone to session.  Pt states she continues to have spasms and will follow up with her MD next week.  Pt missed last session due to transportation issues.  Pt reports recovering from sinus infection and now in antibiotics.    Neuromuscular Re-Education:   Session focused on use of Bioness to decrease spasticity, increase neuromuscular re-ed and function flexion/extension of L UE to perform daily tasks.   Pt placed on Phase of 9, extension setting on 8 and flexion on 3; continued with extension increased due to increase of spasticity today. Performed following functional tasks with Bioness donned on open exercise setting x40 minutes:  - seated at EOM functional weight bearing onto hand timed during extension of Bioness and Min A; progressed to weight bearing onto elbow then to hand x10 reps  - functional grasp/release of items including clothespin, cones and pegs; pt with active thumb and finger extension and able to time movement with Bioness    hemiglide seated at EOM:  -shoulder flexion, extension, horizontal abduction, ER 15 reps each    Supine overhead ROM with T-bar:  -shoulder flexion and chest press 10 reps each with Min A  to maintain grip     Pain: 4/10 L shoulder with ROM    Patient Education:   Educated on Presenter, broadcasting.  Pt verbalized understanding of education.    Home Exercise Program: Pt to focus on gentle stretch of UE and scapular strengthening. Pt is to continue with cane and AROM exercises and to increase wearing of Saebo splint to 6hrs/day.  Pt agreed.     Assessment:   Pt with significant tone and tightness of R UE since missing last session and report of continued spasms at night.  Pt with improved tone and active movement of hand at end of session after use of Bioness.  Pt will benefit from continued use of Bioness in sessions paired with ongoing functional tasks especially balance and mobility tasks.  Pt continues to require skilled OT intervention due to decreased strength, coordination and fine motor skills of L UE limiting her independence with ADL's, functional mobility and IADL's.    Goal Status:  Progressing    Plan:  Patient will benefit from continued skilled therapeutic intervention to address the above functional goals - Continue plan of care with the following special instructions for next visit: continue with weight bearing tasks, re-test ROM  Goals:  Short Term Goals: 6 weeks  1. Pt will demo increase of active movement of L UE by at least 10* all planes in order to increase functional independence with ADL's and daily tasks.   2. Pt will  don/doff shirt/jacket in <30 seconds after set-up with Mod I and no cues in order to increase independence and performance with ADL's.   3. Pt will tolerate full session of bioness paired with weight bearing and ROM activities in order to increase neuromuscular re-education and functional use of L UE.  4. Pt will demo increase in active movement of L UE by performing grasp/release tasks >75% accuracy in order to increase functional use for ADL and feeding tasks.    Long Term Goals: 12 weeks   1. Pt will demo increase of active movement of L UE by at least 20* all  planes from initial evaluation in order to increase functional independence with ADL's and daily tasks.  2. Pt will increase L UE fine motor skills required for ADL's and daily tasks through ability to perform 9-hole peg test.  3. Pt will demo increase in functional use of L UE and ability to participate in ADL's and IADLs through increase stroke impact overall recovery scale to >50%.   4. Pt will demo increase in functional use of L UE through ability to demo self-feeding and grooming tasks in appropriate timeframe.      Luvenia Redden MS OTR/L  Occupational Therapist

## 2013-09-20 ENCOUNTER — Ambulatory Visit: Payer: No Typology Code available for payment source

## 2013-09-21 ENCOUNTER — Ambulatory Visit: Payer: No Typology Code available for payment source

## 2013-09-21 DIAGNOSIS — I6350 Cerebral infarction due to unspecified occlusion or stenosis of unspecified cerebral artery: Secondary | ICD-10-CM

## 2013-09-21 DIAGNOSIS — R29898 Other symptoms and signs involving the musculoskeletal system: Secondary | ICD-10-CM

## 2013-09-21 NOTE — Progress Notes (Signed)
[facility]  Pediatric and Adult Rehabilitation [facility]  [address] [place] [address]  [place] [address]  [phone]    Occupational Therapy Daily Note    PATIENT: Katherine [name]   Referred By: Veneda Melter, MD  DOB: Sep 07, 1950     Medical Record #: [id number]  AGE: 63 y.o.    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  Visit: 28    Precautions and Contraindications:  L hemi, fall risk, TIA, h/o DVT    Subjective: "My arm still hurts from the spasms."    Objective: Pt presented alone to session.  Pt states she saw her neurologist this week and she is beginning Baclofen at 10mg /day starting today.    Neuromuscular Re-Education:   Session focused on use of Bioness to decrease spasticity, increase neuromuscular re-ed and function flexion/extension of L UE to perform daily tasks.   Pt placed on Phase of 9, extension setting on 8 and flexion on 3; continued with extension increased due to increase of spasticity today. Performed following functional tasks with Bioness donned on open exercise setting x40 minutes:  - seated at EOM functional weight bearing onto hand timed during extension of Bioness and CGA x20 reps  - functional weight bearing onto elbow x10 reps  - functional grasp/release of medium sized pegs; pt with active thumb and finger extension and difficulty with grasp  -squats with UE extension to 90* with extension of Bioness x10 reps with Min A for UE  - side lunge step with UE into abduction timed with extension of Bioness x15 reps with Min A  - initially began with open extension on fast setting x4 minutes    Supine overhead ROM with T-bar:  -shoulder flexion and chest press 10 reps each with Min A to maintain grip      Kinesio taping for wrist extensors with 50% tension; edema taping to back of hand with no tension applied.  Taping to L shoulder for support of sublux with 50% tension.    Pain: 2/10 L shoulder with ROM    Patient Education:   Educated on Presenter, broadcasting.  Pt educated on use of Kinesio taping; to remove if any irritation/redness/tingling; to keep on 3-5 days; do not apply heat; wear 3-5 days and then remove.  Pt verbalized understanding of education.    Home Exercise Program: Pt to focus on gentle stretch of UE and scapular strengthening. Pt is to continue with cane and AROM exercises and to increase wearing of Saebo splint to 6hrs/day.  Pt agreed.     Assessment:   Pt with improved tone of L UE wrist and hand after use of Bioness.  Pt is beginning new medication for spasticity and for muscle relaxer which will hopefully help decrease her tone and increase functional movement.  Pt will benefit from continued use of Bioness in sessions paired with ongoing functional tasks especially balance and mobility tasks.  Pt continues to require skilled OT intervention due to decreased strength, coordination and fine motor skills of L UE limiting her independence with ADL's, functional mobility and IADL's.    Goal Status:  Progressing    Plan:  Patient will benefit from continued skilled therapeutic intervention to address the above  functional goals - Continue plan of care with the following special instructions for next visit: continue with weight bearing tasks, re-test ROM  Goals:  Short Term Goals: 6 weeks  1. Pt will demo increase of active movement of L UE by at least 10* all planes in order to increase functional independence with ADL's and daily tasks.   2. Pt will don/doff shirt/jacket in <30 seconds after set-up with Mod I and no cues in order to increase independence and performance with ADL's.   3. Pt will tolerate full session of bioness paired with weight bearing and ROM activities in order to increase  neuromuscular re-education and functional use of L UE.  4. Pt will demo increase in active movement of L UE by performing grasp/release tasks >75% accuracy in order to increase functional use for ADL and feeding tasks.    Long Term Goals: 12 weeks   1. Pt will demo increase of active movement of L UE by at least 20* all planes from initial evaluation in order to increase functional independence with ADL's and daily tasks.  2. Pt will increase L UE fine motor skills required for ADL's and daily tasks through ability to perform 9-hole peg test.  3. Pt will demo increase in functional use of L UE and ability to participate in ADL's and IADLs through increase stroke impact overall recovery scale to >50%.   4. Pt will demo increase in functional use of L UE through ability to demo self-feeding and grooming tasks in appropriate timeframe.      Luvenia Redden MS OTR/L  Occupational Therapist

## 2013-09-23 ENCOUNTER — Ambulatory Visit: Payer: No Typology Code available for payment source

## 2013-09-23 DIAGNOSIS — R29898 Other symptoms and signs involving the musculoskeletal system: Secondary | ICD-10-CM

## 2013-09-23 DIAGNOSIS — I6350 Cerebral infarction due to unspecified occlusion or stenosis of unspecified cerebral artery: Secondary | ICD-10-CM

## 2013-09-23 NOTE — Progress Notes (Signed)
[facility]  Pediatric and Adult Rehabilitation [facility]  [address] [place] [address]  [place] [address]  [phone]    Occupational Therapy Daily Note    PATIENT: Katherine [name]   Referred By: Veneda Melter, MD  DOB: 31-Jul-1950     Medical Record #: [id number]  AGE: 63 y.o.    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  Visit: 29; 21    Precautions and Contraindications:  L hemi, fall risk, TIA, h/o DVT    Subjective: "The medicine isn't helping yet."    Objective: Pt presented alone to session.  Pt states she is taking Baclofen but it makes her fatigued so she did not take it prior to today's session.    Neuromuscular Re-Education:   Session focused on use of Bioness to decrease spasticity, increase neuromuscular re-ed and function flexion/extension of L UE to perform daily tasks.   Pt placed on Phase of 9, extension setting on 8 and flexion on 3; continued with extension increased due to increase of spasticity today. Performed following functional tasks with Bioness donned on open exercise setting x40 minutes:  - seated at EOM functional weight bearing onto hand timed during extension of Bioness and CGA x20 reps  - squats with UE extension to 90* with extension of Bioness x10 reps with Min A for UE  - side lunge step with UE into abduction timed with extension of Bioness x15 reps with Min A  - supine shoulder flexion to 90* ROM with extension of hand x10 reps  - supine shoulder abduction to 90* with extension of hand x10 reps    Static stretch of UE on elevated table to reach flexion and abduction; pt performed 5 reps each    Kinesio taping for wrist extensors with 50% tension; edema taping to back of hand with no  tension applied.  Taping to L shoulder for support of sublux with 50% tension.    Pain: 2/10 L shoulder with ROM    Patient Education:   Educated on Presenter, broadcasting.  Pt educated on use of Kinesio taping; to remove if any irritation/redness/tingling; to keep on 3-5 days; do not apply heat; wear 3-5 days and then remove.  Pt verbalized understanding of education.    Home Exercise Program: Pt to focus on gentle stretch of UE and scapular strengthening. Pt is to continue with cane and AROM exercises and to increase wearing of Saebo splint to 6hrs/day.  Pt agreed.     Assessment:   Pt with improved tone of L UE wrist and hand after use of Bioness; pain today with forearm supination and shoulder abduction.  Pt continues to benefit from use of Bioness in sessions paired with ongoing functional tasks especially balance and mobility tasks.  Pt continues to require skilled OT intervention due to decreased strength, coordination and fine motor skills of L UE limiting her independence with ADL's, functional mobility and IADL's.    Goal Status:  Progressing    Plan:  Patient will benefit from continued skilled therapeutic intervention to address the above functional goals - Continue plan of care with the following special instructions for next visit: continue with weight bearing tasks, re-test ROM  Goals:  Short Term Goals:  6 weeks  1. Pt will demo increase of active movement of L UE by at least 10* all planes in order to increase functional independence with ADL's and daily tasks.   2. Pt will don/doff shirt/jacket in <30 seconds after set-up with Mod I and no cues in order to increase independence and performance with ADL's.   3. Pt will tolerate full session of bioness paired with weight bearing and ROM activities in order to increase neuromuscular re-education and functional use of L UE.  4. Pt will demo increase in active movement of L UE by performing grasp/release tasks >75% accuracy in order to increase functional  use for ADL and feeding tasks.    Long Term Goals: 12 weeks   1. Pt will demo increase of active movement of L UE by at least 20* all planes from initial evaluation in order to increase functional independence with ADL's and daily tasks.  2. Pt will increase L UE fine motor skills required for ADL's and daily tasks through ability to perform 9-hole peg test.  3. Pt will demo increase in functional use of L UE and ability to participate in ADL's and IADLs through increase stroke impact overall recovery scale to >50%.   4. Pt will demo increase in functional use of L UE through ability to demo self-feeding and grooming tasks in appropriate timeframe.      Luvenia Redden MS OTR/L  Occupational Therapist

## 2013-09-27 ENCOUNTER — Ambulatory Visit: Payer: No Typology Code available for payment source

## 2013-09-28 ENCOUNTER — Ambulatory Visit: Payer: No Typology Code available for payment source

## 2013-09-28 DIAGNOSIS — R29898 Other symptoms and signs involving the musculoskeletal system: Secondary | ICD-10-CM

## 2013-09-28 DIAGNOSIS — I6350 Cerebral infarction due to unspecified occlusion or stenosis of unspecified cerebral artery: Secondary | ICD-10-CM

## 2013-09-28 NOTE — Progress Notes (Signed)
[facility]  Pediatric and Adult Rehabilitation [facility]  [address] [place]  [place] [address]  [phone]    Occupational Therapy Daily Note    PATIENT: Katherine [name]   Referred By: Veneda Melter, MD  DOB: Jun 18, 1950     Medical Record #: [id number]  AGE: 63 y.o.    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  Visit: 30; 22    Precautions and Contraindications:  L hemi, fall risk, TIA, h/o DVT    Subjective: "I'm having less spasms."    Objective: Pt presented alone to session.  Pt states she is taking Baclofen at full dose, is having less spasms and sleeping better at night.  Pt reports fatigue but able to get through the day.    Neuromuscular Re-Education:   Improved resting ROM of hand and wrist at beginning of session today.  Performed AAROM of wrist and digits to begin session.    Session focused on use of Bioness to decrease spasticity, increase neuromuscular re-ed and function flexion/extension of L UE to perform daily tasks.   Pt placed on Phase of 9, extension setting on 8 and flexion on 3. Performed following functional tasks with Bioness donned on open exercise setting x40 minutes:  - seated at EOM functional weight bearing onto elevated box through palm of hand hand timed during extension of Bioness and CGA x20 reps  - squats with UE extension to 90* with Mod A of UE with extension of Bioness x10 reps   - side lunge step with UE into abduction timed with extension of Bioness x15 reps with Min A  - forearm supination with squat 10 reps with Min A for movement of UE  - seated abduction, flexion and supination x10 reps each    AROM wrist extension with forearm on table top x5 reps to neutral after use of  Bioness  Flexion of digits and pt with minimal extension    Kinesio taping for wrist extensors with 50% tension; edema taping to back of hand with no tension applied.  Taping to L shoulder for support with 50% tension.    Pain: no c/o    Patient Education:   Pt educated on use of Kinesio taping; to remove if any irritation/redness/tingling; to keep on 3-5 days; do not apply heat; wear 3-5 days and then remove.  Pt verbalized understanding of education.    Home Exercise Program: Pt to focus on gentle stretch of UE and scapular strengthening. Pt is to continue with cane and AROM exercises and to increase wearing of Saebo splint to 6hrs/day.  Pt agreed.     Assessment:   Pt with much improved tone of L UE wrist and hand at beginning of session and increased AROM after use of Bioness.  Pt for the first time today did not have c/o pain with shoulder abduction and supination; likely due to know taking Baclofen.  Anticipate pt will increase functional ROM now that she does not have as much pain.  Pt continues to require skilled OT intervention due to decreased strength, coordination and fine motor skills of L UE limiting her independence with ADL's, functional mobility and IADL's.    Goal Status:  Progressing  Plan:  Patient will benefit from continued skilled therapeutic intervention to address the above functional goals - Continue plan of care with the following special instructions for next visit: continue with weight bearing tasks, re-test ROM  Goals:  Short Term Goals: 6 weeks  1. Pt will demo increase of active movement of L UE by at least 10* all planes in order to increase functional independence with ADL's and daily tasks.   2. Pt will don/doff shirt/jacket in <30 seconds after set-up with Mod I and no cues in order to increase independence and performance with ADL's.   3. Pt will tolerate full session of bioness paired with weight bearing and ROM activities in order to increase neuromuscular re-education and  functional use of L UE.  4. Pt will demo increase in active movement of L UE by performing grasp/release tasks >75% accuracy in order to increase functional use for ADL and feeding tasks.    Long Term Goals: 12 weeks   1. Pt will demo increase of active movement of L UE by at least 20* all planes from initial evaluation in order to increase functional independence with ADL's and daily tasks.  2. Pt will increase L UE fine motor skills required for ADL's and daily tasks through ability to perform 9-hole peg test.  3. Pt will demo increase in functional use of L UE and ability to participate in ADL's and IADLs through increase stroke impact overall recovery scale to >50%.   4. Pt will demo increase in functional use of L UE through ability to demo self-feeding and grooming tasks in appropriate timeframe.      Luvenia Redden MS OTR/L  Occupational Therapist

## 2013-09-30 ENCOUNTER — Ambulatory Visit: Payer: No Typology Code available for payment source

## 2013-09-30 DIAGNOSIS — R269 Unspecified abnormalities of gait and mobility: Secondary | ICD-10-CM

## 2013-09-30 DIAGNOSIS — R29898 Other symptoms and signs involving the musculoskeletal system: Secondary | ICD-10-CM

## 2013-09-30 DIAGNOSIS — R279 Unspecified lack of coordination: Secondary | ICD-10-CM

## 2013-09-30 DIAGNOSIS — I69998 Other sequelae following unspecified cerebrovascular disease: Secondary | ICD-10-CM | POA: Insufficient documentation

## 2013-09-30 DIAGNOSIS — R41841 Cognitive communication deficit: Secondary | ICD-10-CM

## 2013-09-30 DIAGNOSIS — I6350 Cerebral infarction due to unspecified occlusion or stenosis of unspecified cerebral artery: Secondary | ICD-10-CM

## 2013-09-30 NOTE — Progress Notes (Signed)
[facility]  [address], [address]  [place], [place]  [address]  Phone:  [phone]  Fax:  [phone]    Speech Therapy Daily Note    PATIENT: Katherine [name]    Referred By: Veneda Melter, MD  DOB:   07/18/1950                                 Medical Record #: [id number]  AGE:   63 y.o.                          Primary Diagnosis: CVA (cerebral infarction) [434.91]  Facility Provider #: [phone]   HICN# Patient is not covered by Medicare.    Treatment Diagnosis:  Executive Function/Memory Deficits 799.55    Visit #: SLP Visit  SLP Visit Number: 12  Visit Limit:  30 visits      Certification Period: 05/26/13 to 11/23/13     Time of treatment:   Time Calculation  SLP Received On: 09/30/2013  Start Time: 1400  Stop Time: 1500  Time Calculation (min): 60 min    Medications:  has a current medication list which includes the following prescription(s): atorvastatin, insulin aspart, insulin glargine, insulin syringe 1cc/29g, pramipexole, and rivaroxaban, baclofen    Patient/parent does report changes to medication at this time. (Baclofen)    Patient Goal:  Patient Goal: To improve cognition, return to work and use a computer for return to work.    Therapy Goals:  (Copied from Evaluation)  Goal status updated today.    Short Term Goals:   To be completed by 08/23/13:  1.  Pt will complete Test of Memory and Learning x 100%. *Mastered  2.  Pt will recall memory strategies x 100% I.  *Mastered  3.  Pt will demonstrate use of memory strategies to organize a list of 20 words for recall x 80% with min cues.  *Mastered  4.  Added 07/07/13: Pt will complete convergent naming activities with concrete and abstract items x 90% I.  5.  Added 07/07/13: Pt will complete executive function tasks of reasoning, decision making and self-correction x 90% I using requirements per NeuroPsych Online.     Long Term Goals:  To be completed by 11/23/13:  1.  Pt will answer questions from  visual stimulus of 3-5 minutes x 80% with min cues.   2.  Pt will recall a list of 20 words x 90% with self-cues. *Mastered  3.  Pt will demonstrate immediate recall of location of 4-8 objects x 90% with self-cues.  4.  Added 08/12/2013:  Pt will demonstrate ability to complete high-level cognitive tasks, such as deductive reasoning tasks, x 90% with min verbal cues and with auditory distraction.    Subjective:   Pain report:  []   [name] reported today.  [x]   Pain level  7/10 today.  Location:  Arm with pressure 5/10 without    Subjective patient report:  Pt has not completed HEP since last visit.  New med (see above) causes fatigue and decreases speech and ability to concentrate by evening.  She has also "had a lot going on with graduations and such".     Objective:  Objective Findings today:   STG 5:  Track 2: Barista  Task 2: Attributes and Groups    *One must be able  to accurately analyze and compare attributes of the objects presented.  Based upon the analysis the objects must be placed in a grid so that rows, columns and corners have like attributes.  In level 4, pt must identify common attribute based on presentations.    Requirements for pass:  100% accuracy    Today's results:    Level:2, 2 and 3  # errors: 0, 0 and 2  Pass: Yes, yes, no  *Improved to level 3    Track 4: Visuospatial Skills  Task 4: Design Completion    *This visual processing and planning task requires one to analyze lines and angles to match exserts to their parent patterns.    *Requirements: 100% accuracy on all levels    Level: 1  # targets: 5  # clicks: 6  % correct: 92  # errors: 1  Pass: No      Interventions Performed:   []  SLP provided pt with tactile cues:   [x]  SLP provided pt with visual cues - mod  [x]  SLP provided pt with verbal cues - max  []  SLP provided pt with phonemic cues   []  Handouts on:   [x]  NeuroPsych Online: computer software focused on predetermined therapy protocol in which the patient is systematically  presented with the tracks listed below in hierarchy contingent on completing prior to progression to higher level tasks.   []  Attention Skills   [x]  Executive Functioning Skills   []  Memory Skills   [x]  Visualspatial Skills   []  Problem Solving Skills   []  Communication Skills  [x]    Photo Cue Cards: Everyday activities.  SLP had pt view each card. for 1 minute then answer 1 question on each card.  []  Other: Mastermind used for visual deductive reasoning.  Visual and auditory distractions provided.    Education/Home Exercise Program:    Patient was educated on continuing regular exercise to maintain quality of life.  Pt. demonstrated an understanding of this education.    []   No updates to home program today    [x]   HEP updated:  Updated NPO program    Assessment:   Pt had mild decline in function without regular daily exercise, but responded well to education in order to continue gains.    Pt continues to have difficulty with auditory and visual distraction, which would create difficulty for her to return to work.      Patient presents with mild to moderate cognitive changes characterized by memory loss, difficulty with slow auditory comprehension and nonverbal memory tasks, difficulty with judgment and writing. Patient requires skilled therapy intervention to address Assessment: Impaired Memory, as well as the above mentioned deficits to maximize safety with daily activities and return to prior level of function.  Progress Toward Functional Goals: all LTGs     Patient Requires Continued Skilled Care to focus on visual memory in order to increased patient safety and independence.    Plan: Continue with established plan of care with focus on executive function.      Treatment Code # of Minutes   SLP Swallow Treatment [phone])    SLP Treatment Rudean Hitt [phone]) Time Calculation (min): 60 min             Therapist Signature:   Vuk Skillern, MEd, Information systems manager Pathologist/Senior Therapist  [facility]  Pediatric and Adult Rehabilitation [email or web address]  09/30/2013

## 2013-09-30 NOTE — Progress Notes (Signed)
[facility]  Pediatric and Adult Rehabilitation [facility]  [address]  [place] [address]  [phone]    Occupational Therapy Daily Note    PATIENT: Katherine [name]   Referred By: Veneda Melter, MD  DOB: [birth year]     Medical Record #: [id number]  AGE: 63 y.o.    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  Visit: 31; 23    Precautions and Contraindications:  L hemi, fall risk, TIA, h/o DVT    Subjective: "I still have some spasms and arm pain."    Objective: Pt presented alone to session.  Pt states she is taking Baclofen at full dose, is having less spasms and sleeping better at night.      Neuromuscular Re-Education:   Began session with AAROM of shoulder, elbow, wrist and digits all planes to decrease tone.    Session focused on use of Bioness to decrease spasticity, increase neuromuscular re-ed and function flexion/extension of L UE to perform daily tasks.   Pt placed on Phase of 9, extension setting on 8 and flexion on 3. Performed following functional tasks with Bioness donned on open exercise setting x40 minutes:  - seated at EOM functional weight bearing onto elevated box through palm of hand hand timed during extension of Bioness and CGA x20 reps; progressed to UE in external rotation with weight bearing  - squats with UE extension to 90* with Mod A of UE with extension of Bioness x10 reps   - side lunge step with UE into abduction timed with extension of Bioness x15 reps with Min A  -forward step lunge with UE supination and opening of chest x15 reps with Min A  - functional mobility with minimal verbal cues and Mod A for UE arm swing on L UE  -shoulder shrugs and scapular squeezes 20 reps  each    Kinesio taping for wrist extensors with 50% tension; edema taping to back of hand with no tension applied.  Taping to L shoulder for support with 50% tension.    Pain: UE 2/10    Patient Education:   Pt educated on use of Kinesio taping; to remove if any irritation/redness/tingling; to keep on 3-5 days; do not apply heat; wear 3-5 days and then remove.  Pt verbalized understanding of education.    Home Exercise Program: Pt to focus on gentle stretch of UE and scapular strengthening. Pt is to continue with cane and AROM exercises and to increase wearing of Saebo splint to 6hrs/day.  Pt agreed.     Assessment:   Pt with much improved tone of L UE wrist and hand this session; continues with slight c/o pain with UE abduction and external rotation.  pt requires assist with UE ROM during functional exercises but is improving with ROM.  Pt continues to require skilled OT intervention due to decreased strength, coordination and fine motor skills of L UE limiting her independence with ADL's, functional mobility and IADL's.    Goal Status:  Progressing    Plan:  Patient will benefit from continued skilled therapeutic intervention to address the above functional goals - Continue plan of care with the following special instructions for next visit: continue with weight bearing tasks,  re-test ROM  Goals:  Short Term Goals: 6 weeks  1. Pt will demo increase of active movement of L UE by at least 10* all planes in order to increase functional independence with ADL's and daily tasks.   2. Pt will don/doff shirt/jacket in <30 seconds after set-up with Mod I and no cues in order to increase independence and performance with ADL's.   3. Pt will tolerate full session of bioness paired with weight bearing and ROM activities in order to increase neuromuscular re-education and functional use of L UE.  4. Pt will demo increase in active movement of L UE by performing grasp/release tasks >75% accuracy in order to increase functional  use for ADL and feeding tasks.    Long Term Goals: 12 weeks   1. Pt will demo increase of active movement of L UE by at least 20* all planes from initial evaluation in order to increase functional independence with ADL's and daily tasks.  2. Pt will increase L UE fine motor skills required for ADL's and daily tasks through ability to perform 9-hole peg test.  3. Pt will demo increase in functional use of L UE and ability to participate in ADL's and IADLs through increase stroke impact overall recovery scale to >50%.   4. Pt will demo increase in functional use of L UE through ability to demo self-feeding and grooming tasks in appropriate timeframe.      Luvenia Redden MS OTR/L  Occupational Therapist

## 2013-09-30 NOTE — PT Eval Note (Addendum)
[facility]  [address]  [place], [place]  [address]  Phone:  [phone]  Fax:  [phone]    PHYSICAL THERAPY EVALUATION      Referred By: Veneda Melter, MD    *I agree to the plan of care stated below*                                                                                                                                           Physician Signature      Date      PATIENT: Katherine [name] DOB: [birth year]   MR #: [id number]  AGE: 63 y.o.    FACILITY PROVIDER #: [id number] PRIMARY MD: Veneda Melter, MD (General)    HICN# Patient is not covered by Medicare. DIAGNOSES: CVA (cerebral infarction) [434.91]    CERTIFICATION PERIOD: 09/30/2013 to 12-31-13       Date of Service PT Received On: 09/30/13   Treatment Time Start Time: 1300 to Stop Time: 1400   Time Calculation Time Calculation (min): 60 min   Visit #  1   Units Billed PT Evaluation  $ PT Evaluation (97001): 1 Procedure       Past Medical/Surgical History:  Past Medical History   Diagnosis Date   . Cerebrovascular accident    . Migraine    . PFO (patent foramen ovale)    . Diabetes mellitus    . Hyperlipidemia    . Seasonal allergies      Past Surgical History   Procedure Laterality Date   . Cholecystectomy     . Hysterectomy     . Total abdominal hysterectomy w/ bilateral salpingoophorectomy     . Carpal tunnel release       L         Medications:  has a current medication list which includes the following prescription(s): atorvastatin, insulin aspart, insulin glargine, insulin syringe 1cc/29g, pramipexole, and rivaroxaban.    Precautions:   fall    History of Present Illness: Katherine [name] is a 63 y.o. female s/p CVA w/ L hemiparesis mostly affecting LUE.  She is currently receiving OT and SLP services and had PT in past but was 'd when fully functioning as a household ambulator.  Pt returns to PT d/t c/o poor quality of gait.      Home Environment:     Lives w/  dtr, son in law and 3 young grandchildren in multi level home.  She lives in basement level and is able to go up 2 flights to do laundry holding hand rail.      Prior level of function:     Prior to CVA was active I, lived alone, worked as Warden/ranger, +driving.    Patient Goal:  To walk wihtout a limp, to be able to go up/downstairs w/o  rail so can carry things to other levels of home easier, to be able to drive.  Pain:     L hip pain when sits >1 hr or sits wrong.  Note R groin pain 3/10 with end range ER stretch.     SUBJECTIVE REPORT:  Pt reports having a fear of falling-fell when had CVA backwards down concrete steps and has had 2 falls since then when tripped on area rugh which has now been removed.  Reports diffculty getting up from floor.  Pt reports every day does her bridges and lunge exercises and LUE exercises.  Goes for 1 mi. Walk daily     OBJECTIVE FINDINGS:    Gross Range of Motion:    Note mild tightness L hip ER, gastrocs                               Gross Strength:     MMT hip flex R 4+, L 4-            Hip ex R 4+, L 4            Hip abd R 4+, L 3+>4-            Hip add R 5, L 4-  Quads R 4+, L 3+>4-  Hams R 4+, 4  Ant tib R 4_, L 4  Ankle ev R 4+, L 4-  Ankle inv R 5, L 4+  gastrocs R 4-, L 2+>3-                Sensation:   NT today    Tone:     Note inc flexor tone LUE      Functional Mobility:     Pt able to mover from sup<>sit, roll to prone/sides I with compensations for LUE paresis.  Pt able to asc/desc steps w/ rail.  Note decreased eccentric control B when descending.    Transfers:    sit<>stand I, bed mobility I  Floor<>stand NT today    Balance:    Sit static N-tolerates max F all 4 directions, dynamic-Pt able to reach max outside BOS with normal trunk righting reactions  Stand static-Pt tolerates mod forces all directions except min force A/P, dynamic-pt w/ 7" fcnl reach(6" less than norms for her age group)  SLS R 1,2 s., L unable     Locomotion:       Pt ambulates w/o AD,  note deceased stance time LLE, mild dec H to T L foot, mild L trendelenburg, slow pace.  Note when walks at faster pace, more equal stance time and less Trendelenburg.      Outcomes:   minibest test 20/28 71%    Patient Education:   Patient was educated on goals and benefits of therapy, as well as HEP of calf raises, bridges w/ abd   Pt. did demonstrate an understanding of this education.  Home Exercise Program was issued today.    ASSESSMENT: Katherine [name] is a 63 y.o. female presents to physical therapy with the following impairments:       Functional Limitations include:  Patient is at risk for falls.    Disabilities:  Without intervention, patient may not be able to:  Participate in safe and independent community ambulation for IADL's such as grocery shopping and preparing meals.  Care for children safely and independently.    Patient will require skilled intervention to address above stated  functional limitations and prevent resultant disability.    GOALS:   1.  Pt will be I comprehensive HEP of balance/strength/flexibility ex and carry over on daily basis.  2.  Pt will be assessed on neurocom for SOT, adaptability and motor control.  3.  Pt will be able to ambulate at normal gait speed with no deviations for community ambulation-as evidenced by the ability to ambulate on TM at 2.0 MPH and will be able to ambulate on inclined TM up to 10% grade w/ full L foot clearance.  4.  Pt will improve balance as deomo's by minibest score increasing to 25/28.  5.  Pt will easily be able to get down/up from floor to allow her to play and interact with grandchildren, improve her quality of life.  G Codes:     [id number]  [id number] CI    Rehabilitation Potential:  Good for goals.    Treatment Today:  Evaluation    PLAN:   Will see up to 2x/wk for 12 weeks to reach above goals.    Therapist Signature:  Samul Dada PT [phone]  [facility]  Adult and Pediatric Rehab  [address] [place] [place]  [place], [place]  [address]  [phone]    09/30/2013

## 2013-10-04 ENCOUNTER — Ambulatory Visit: Payer: No Typology Code available for payment source

## 2013-10-05 ENCOUNTER — Ambulatory Visit: Payer: No Typology Code available for payment source

## 2013-10-05 DIAGNOSIS — R29898 Other symptoms and signs involving the musculoskeletal system: Secondary | ICD-10-CM

## 2013-10-05 DIAGNOSIS — I6350 Cerebral infarction due to unspecified occlusion or stenosis of unspecified cerebral artery: Secondary | ICD-10-CM

## 2013-10-05 NOTE — Progress Notes (Signed)
[facility]  Pediatric and Adult Rehabilitation [facility]  [address]  [place] [address]  [phone]    Occupational Therapy Daily Note    PATIENT: Katherine [name]   Referred By: Veneda Melter, MD  DOB: [birth year]     Medical Record #: [id number]  AGE: 63 y.o.    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  Visit: 32; 24    Precautions and Contraindications:  L hemi, fall risk, TIA, h/o DVT    Subjective: "My hand is really tight today."    Objective: Pt presented alone to session.  Pt states she is taking Baclofen at full dose but had increased spasms yesterday.    Neuromuscular Re-Education:   Began session with AAROM of shoulder, elbow, wrist and digits all planes to decrease tone.  Weight bearing at table top to increase wrist extension.    Bioness to decrease spasticity, increase neuromuscular re-ed and function flexion/extension of L UE to perform daily tasks.   Pt placed on Phase of 9, extension setting on 8 and flexion on 3. Performed following functional tasks with Bioness donned on open exercise setting x25 minutes:  - seated functional weight bearing onto tabletop through palm of hand hand timed during extension of Bioness and CGA x20 reps  - standing functional weight bearing onto tabletop through palm of hand to increase wrist extension x20 reps  - further AAROM of digits during extension setting    [employer] Glove L UE with Min A to don and placed on easy setting with focus on thumb to 2nd digit pinch to music; pt with 7/68 first trial and 11/68 2nd trial.  Requires assist from therapist to increase pinch grip due to increased spasticity today.    Kinesio taping for wrist extensors with 50%  tension; edema taping to back of hand with no tension applied.  Taping to L shoulder for support with 50% tension.    Pain: UE 2/10    Patient Education:   Pt educated on use of Kinesio taping; to remove if any irritation/redness/tingling; to keep on 3-5 days; do not apply heat; wear 3-5 days and then remove.  Pt verbalized understanding of education.    Home Exercise Program: Pt to focus on gentle stretch of UE and scapular strengthening. Pt is to continue with cane and AROM exercises and to increase wearing of Saebo splint to 6hrs/day.  Pt agreed.     Assessment:   Pt appears to benefit from music glove for fine motor and coordination of UE; challenged with 2 point pinch due to ongoing spasticity, but task is interesting and meaningful for pt.  Increased tone today in hand and wrist and improved after use of modalities and passive stretch.  Pt continues to require skilled OT intervention due to decreased strength, coordination and fine motor skills of L UE limiting her independence with ADL's, functional mobility and IADL's.    Goal Status:  Progressing    Plan:  Patient will benefit from continued skilled therapeutic intervention to address the above functional goals - Continue plan of care with the following special instructions for next visit: continue with weight bearing tasks, re-test ROM  Goals:  Short Term Goals: 6 weeks  1. Pt will demo increase of active movement of L UE by at least 10* all planes in order to increase functional independence with ADL's and daily tasks.   2. Pt will don/doff shirt/jacket in <30 seconds after set-up with Mod I and no cues in order to increase independence and performance with ADL's.   3. Pt will tolerate full session of bioness paired with weight bearing and ROM activities in order to increase neuromuscular re-education and functional use of L UE.  4. Pt will demo increase in active movement of L UE by performing grasp/release tasks >75% accuracy in order to increase  functional use for ADL and feeding tasks.    Long Term Goals: 12 weeks   1. Pt will demo increase of active movement of L UE by at least 20* all planes from initial evaluation in order to increase functional independence with ADL's and daily tasks.  2. Pt will increase L UE fine motor skills required for ADL's and daily tasks through ability to perform 9-hole peg test.  3. Pt will demo increase in functional use of L UE and ability to participate in ADL's and IADLs through increase stroke impact overall recovery scale to >50%.   4. Pt will demo increase in functional use of L UE through ability to demo self-feeding and grooming tasks in appropriate timeframe.      Luvenia Redden MS OTR/L  Occupational Therapist

## 2013-10-07 ENCOUNTER — Ambulatory Visit: Payer: No Typology Code available for payment source

## 2013-10-07 DIAGNOSIS — R29818 Other symptoms and signs involving the nervous system: Secondary | ICD-10-CM

## 2013-10-07 DIAGNOSIS — R41844 Frontal lobe and executive function deficit: Secondary | ICD-10-CM

## 2013-10-07 DIAGNOSIS — I6350 Cerebral infarction due to unspecified occlusion or stenosis of unspecified cerebral artery: Secondary | ICD-10-CM

## 2013-10-07 DIAGNOSIS — R29898 Other symptoms and signs involving the musculoskeletal system: Secondary | ICD-10-CM

## 2013-10-07 NOTE — Progress Notes (Signed)
[facility]  [address], [address]  [place], [place]  [address]  Phone:  [phone]  Fax:  [phone]    Speech Therapy Daily Note    PATIENT: Katherine [name]    Referred By: Veneda Melter, MD  DOB:   Oct 21, 1950                                 Medical Record #: [id number]  AGE:   63 y.o.                          Primary Diagnosis: CVA (cerebral infarction) [434.91]  Facility Provider #: [id number]   HICN# Patient is not covered by Medicare.    Treatment Diagnosis:  Executive Function/Memory Deficits 799.55    Visit #: SLP Visit  SLP Visit Number: 13  Visit Limit:  30 visits      Certification Period: 05/26/13 to 11/23/13     Time of treatment:   Time Calculation  SLP Received On: 10/07/2013  Start Time: 1400  Stop Time: 1500  Time Calculation (min): 60 min    Medications:  has a current medication list which includes the following prescription(s): atorvastatin, insulin aspart, insulin glargine, insulin syringe 1cc/29g, pramipexole, and rivaroxaban, baclofen    Patient/parent does report changes to medication at this time. (Baclofen)    Patient Goal:  Patient Goal: To improve cognition, return to work and use a computer for return to work.    Therapy Goals:  (Copied from Evaluation)  Goal status updated today.    Short Term Goals:   To be completed by 08/23/13:  1.  Pt will complete Test of Memory and Learning x 100%. *Mastered  2.  Pt will recall memory strategies x 100% I.  *Mastered  3.  Pt will demonstrate use of memory strategies to organize a list of 20 words for recall x 80% with min cues.  *Mastered  4.  Added 07/07/13: Pt will complete convergent naming activities with concrete and abstract items x 90% I.  5.  Added 07/07/13: Pt will complete executive function tasks of reasoning, decision making and self-correction x 90% I using requirements per NeuroPsych Online.     Long Term Goals:  To be completed by 11/23/13:  1.  Pt will answer questions from  visual stimulus of 3-5 minutes x 80% with min cues.   2.  Pt will recall a list of 20 words x 90% with self-cues. *Mastered  3.  Pt will demonstrate immediate recall of location of 4-8 objects x 90% with self-cues.  4.  Added 08/12/2013:  Pt will demonstrate ability to complete high-level cognitive tasks, such as deductive reasoning tasks, x 90% with min verbal cues and with auditory distraction.    Subjective:   Pain report:  []   [name] reported today.  [x]   Pain level  7/10 today.  Location:  Arm with pressure 5/10 without    Subjective patient report:  Pt has had continued difficulty with problem solving track of HEP on NeuroPsych online (NPO).      Objective:  Objective Findings today:   Track 5: Problem Solving Skills  Task 1: Deduce It    *Pt to analyze the written clues provided to deduce the numbers represented (1-3 digits, increasing in complexity by level.    **Requirements: 100% accuracy at all levels  out of 5 presentations per task.    Level: 3  # correct: 3  Pass: No    Interventions Performed:   []  SLP provided pt with tactile cues:   [x]  SLP provided pt with visual cues - mod  [x]  SLP provided pt with verbal cues - max  []  SLP provided pt with phonemic cues   []  Handouts on:   [x]  NeuroPsych Online: computer software focused on predetermined therapy protocol in which the patient is systematically presented with the tracks listed below in hierarchy contingent on completing prior to progression to higher level tasks.   []  Attention Skills   []  Executive Functioning Skills   []  Memory Skills   []  Visualspatial Skills   [x]  Problem Solving Skills   []  Communication Skills  []    Photo Cue Cards: Everyday activities.  SLP had pt view each card. for 1 minute then answer 1 question on each card.  []  Other: Mastermind used for visual deductive reasoning.  Visual and auditory distractions provided.    Education/Home Exercise Program:    Patient was educated on continuing regular exercise to maintain quality of  life.  Pt. demonstrated an understanding of this education.    []   No updates to home program today    [x]   HEP updated:  Updated NPO program    Assessment:   Pt continues to have difficulty with auditory and visual distraction, which would create difficulty for her to return to work.      Patient presents with mild to moderate cognitive changes characterized by memory loss, difficulty with slow auditory comprehension and nonverbal memory tasks, difficulty with judgment and writing. Patient requires skilled therapy intervention to address Assessment: Impaired Memory, as well as the above mentioned deficits to maximize safety with daily activities and return to prior level of function.  Progress Toward Functional Goals: all LTGs     Patient Requires Continued Skilled Care to focus on visual memory in order to increased patient safety and independence.    Plan: Continue with established plan of care with focus on executive function.      Treatment Code # of Minutes   SLP Swallow Treatment [phone])    SLP Treatment Rudean Hitt [phone]) Time Calculation (min): 60 min             Therapist Signature:   Misk Galentine, MEd, Information systems manager Pathologist/Senior Therapist  [facility]  Pediatric and Adult Rehabilitation [email or web address]  10/07/2013

## 2013-10-07 NOTE — Progress Notes (Signed)
[facility]  Pediatric and Adult Rehabilitation [facility]  [address]  [place] [address]  [phone]    Occupational Therapy Daily Note    PATIENT: Katherine [name]   Referred By: Veneda Melter, MD  DOB: [birth year]     Medical Record #: [id number]  AGE: 63 y.o.    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  Visit: 33; 25    Precautions and Contraindications:  L hemi, fall risk, TIA, h/o DVT    Subjective: "I've been using the music glove every day, it's challenging!"    Objective: Pt presented alone to session.  Pt states she is taking Baclofen at full dose but continues to have spasms at night.    Neuromuscular Re-Education:   Began session with AAROM of shoulder, elbow, wrist and digits all planes to decrease tone.  Weight bearing at table top to increase wrist extension.  Pt with decreased pain at shoulder today and able to tolerate ROM to 90* flexion and abduction.    Bioness to decrease spasticity, increase neuromuscular re-ed and function flexion/extension of L UE to perform daily tasks.   Pt placed on Phase of 9, extension setting on 8 and flexion on 3. Performed following functional tasks with Bioness donned on open exercise setting x35 minutes:  - seated functional weight bearing at EOM through palm of hand hand timed during extension of Bioness and CGA x20 reps  - further AAROM of digits during extension setting  -squats with UE shoulder flexion with hand held assist for L UE flexion to 90* 15 reps  -side step with UE abduction with Mod A to reach through shoulder and forearm supination x15 reps    Kinesio taping for wrist extensors with 50% tension; edema taping to back of hand with no tension  applied.  Taping to L shoulder for support with 50% tension.    Pain: UE 2/10    Patient Education:   Pt educated on use of Kinesio taping; to remove if any irritation/redness/tingling; to keep on 3-5 days; do not apply heat; wear 3-5 days and then remove.  Pt verbalized understanding of education.    Home Exercise Program: Pt to focus on gentle stretch of UE and scapular strengthening. Pt is to continue with cane and AROM exercises and to increase wearing of Saebo splint to 6hrs/day.  Pt agreed.     Assessment:   Pt with decreased c/o pain today with shoulder ROM throughout session.  Improved extension of wrist and digits after use of Bioness.  Pt continues to require skilled OT intervention due to decreased strength, coordination and fine motor skills of L UE limiting her independence with ADL's, functional mobility and IADL's.    Goal Status:  Progressing    Plan:  Patient will benefit from continued skilled therapeutic intervention to address the above functional goals - Continue plan of care with the following special instructions for next visit: continue with weight bearing tasks, re-test ROM  Goals:  Short Term Goals: 6 weeks  1. Pt will demo increase of active movement of L UE by at least 10* all planes in order to increase functional independence with ADL's and daily tasks.   2. Pt will don/doff  shirt/jacket in <30 seconds after set-up with Mod I and no cues in order to increase independence and performance with ADL's.   3. Pt will tolerate full session of bioness paired with weight bearing and ROM activities in order to increase neuromuscular re-education and functional use of L UE.  4. Pt will demo increase in active movement of L UE by performing grasp/release tasks >75% accuracy in order to increase functional use for ADL and feeding tasks.    Long Term Goals: 12 weeks   1. Pt will demo increase of active movement of L UE by at least 20* all planes from initial evaluation in order to increase functional  independence with ADL's and daily tasks.  2. Pt will increase L UE fine motor skills required for ADL's and daily tasks through ability to perform 9-hole peg test.  3. Pt will demo increase in functional use of L UE and ability to participate in ADL's and IADLs through increase stroke impact overall recovery scale to >50%.   4. Pt will demo increase in functional use of L UE through ability to demo self-feeding and grooming tasks in appropriate timeframe.      Luvenia Redden MS OTR/L  Occupational Therapist

## 2013-10-11 ENCOUNTER — Ambulatory Visit: Payer: No Typology Code available for payment source

## 2013-10-12 ENCOUNTER — Ambulatory Visit: Payer: No Typology Code available for payment source

## 2013-10-12 DIAGNOSIS — R279 Unspecified lack of coordination: Secondary | ICD-10-CM

## 2013-10-12 DIAGNOSIS — R29898 Other symptoms and signs involving the musculoskeletal system: Secondary | ICD-10-CM

## 2013-10-12 DIAGNOSIS — I6350 Cerebral infarction due to unspecified occlusion or stenosis of unspecified cerebral artery: Secondary | ICD-10-CM

## 2013-10-12 NOTE — Progress Notes (Signed)
[facility]  Pediatric and Adult Rehabilitation [facility]  [address]  [place] [address]  [phone]    Occupational Therapy Daily Note    PATIENT: Katherine [name]   Referred By: Veneda Melter, MD  DOB: 1950-09-20     Medical Record #: [id number]  AGE: 63 y.o.    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  Visit: 34; 26    Precautions and Contraindications:  L hemi, fall risk, TIA, h/o DVT    Subjective: "I think my hand is better because of the music glove!"    Objective: Pt presented alone to session.  Pt states she is taking Baclofen at full dose but continues to have spasms at night.    Neuromuscular Re-Education:   Began session with AAROM of shoulder, elbow, wrist and digits all planes to decrease tone.      Supine ROM with t-bar:  -overhead shoulder flexion without assist for grip L UE 20 reps  -chest press 20 reps  -large circles forward/back 20 reps each  -abduction 20 reps    Bioness to decrease spasticity, increase neuromuscular re-ed and function flexion/extension of L UE to perform daily tasks.   Pt placed on Phase of 9, extension setting on 8 and flexion on 3. Performed following functional tasks with Bioness donned on open exercise setting x35 minutes:  - seated functional weight bearing at EOM through palm of hand hand timed during extension of Bioness and CGA x20 reps  -squats with UE shoulder flexion with hand held assist for L UE flexion to 90* 15 reps  -side step with UE abduction with Mod A to reach through shoulder and forearm supination x15 reps    Removed Bioness and pt performed fine motor task with medium sized pegs:  -retreive pegs with L UE and release with about 75%  accuracy    Kinesio taping for wrist extensors with 50% tension; edema taping to back of hand with no tension applied.  Taping to L shoulder for support with 50% tension.    Pain: UE 2/10    Patient Education:   Pt educated on use of Kinesio taping; to remove if any irritation/redness/tingling; to keep on 3-5 days; do not apply heat; wear 3-5 days and then remove.  Pt verbalized understanding of education.    Home Exercise Program: Pt to focus on gentle stretch of UE and scapular strengthening. Pt is to continue with cane and AROM exercises and to increase wearing of Saebo splint to 6hrs/day.  Pt agreed.     Assessment:   Pt continues with improved tolerance with AAROM for shoulder and less c/o pain since medication adjustment.  Pt now able to grasp t-bar during exercises without assist and appears to have improved grip strength.  Test grip next session.  Pt continues to require skilled OT intervention due to decreased strength, coordination and fine motor skills of L UE limiting her independence with ADL's, functional mobility and IADL's.    Goal Status:  Progressing    Plan:  Patient will benefit from continued skilled therapeutic intervention to address the above functional goals - Continue plan of care with the following special instructions for next visit: continue with weight bearing tasks,  re-test ROM; test grip strength  Goals:  Short Term Goals: 6 weeks  1. Pt will demo increase of active movement of L UE by at least 10* all planes in order to increase functional independence with ADL's and daily tasks.   2. Pt will don/doff shirt/jacket in <30 seconds after set-up with Mod I and no cues in order to increase independence and performance with ADL's.   3. Pt will tolerate full session of bioness paired with weight bearing and ROM activities in order to increase neuromuscular re-education and functional use of L UE.  4. Pt will demo increase in active movement of L UE by performing grasp/release tasks >75%  accuracy in order to increase functional use for ADL and feeding tasks.    Long Term Goals: 12 weeks   1. Pt will demo increase of active movement of L UE by at least 20* all planes from initial evaluation in order to increase functional independence with ADL's and daily tasks.  2. Pt will increase L UE fine motor skills required for ADL's and daily tasks through ability to perform 9-hole peg test.  3. Pt will demo increase in functional use of L UE and ability to participate in ADL's and IADLs through increase stroke impact overall recovery scale to >50%.   4. Pt will demo increase in functional use of L UE through ability to demo self-feeding and grooming tasks in appropriate timeframe.      Luvenia Redden MS OTR/L  Occupational Therapist

## 2013-10-14 ENCOUNTER — Ambulatory Visit: Payer: No Typology Code available for payment source

## 2013-10-14 DIAGNOSIS — R41841 Cognitive communication deficit: Secondary | ICD-10-CM

## 2013-10-14 DIAGNOSIS — R279 Unspecified lack of coordination: Secondary | ICD-10-CM

## 2013-10-14 DIAGNOSIS — I6350 Cerebral infarction due to unspecified occlusion or stenosis of unspecified cerebral artery: Secondary | ICD-10-CM

## 2013-10-14 DIAGNOSIS — R29898 Other symptoms and signs involving the musculoskeletal system: Secondary | ICD-10-CM

## 2013-10-14 DIAGNOSIS — R269 Unspecified abnormalities of gait and mobility: Secondary | ICD-10-CM

## 2013-10-14 NOTE — Progress Notes (Signed)
[facility]  [address]  [place], [place]  [address]  Phone:  [phone]  Fax:  [phone]    PHYSICAL THERAPY DAILY TREATMENT NOTE    PATIENT: Katherine [name] DOB: Apr 25, 1950   MR #: [id number]  AGE: 63 y.o.    REFERRING MD:    PRIMARY MD: Veneda Melter, MD (General)    CERTIFICATION DATES:     09-30-13 to 12-31-13 DIAGNOSES: CVA (cerebral infarction) [434.91]          Date of Service PT Received On: 10/14/13   Treatment Time Start Time: 1300 to Stop Time: 1400   Time Calculation Time Calculation (min): 60 min   Visit #  2   Units Billed   Therapeutic Interventions  $ PT Gait Training (34742): 8-22 mins  $ PT Therapeutic Exercise (97110): 23-37 mins  $ PT Therapeutic Activity (97530): 1 unit       Precautions/Contraindications:  Fall Risk    Medications:  has a current medication list which includes the following prescription(s): atorvastatin, baclofen, insulin aspart, insulin glargine, insulin syringe 1cc/29g, pramipexole, and rivaroxaban.  Patient/Caregiver does not report changes to medication at this time.    SUBJECTIVE:     Pain report:  L UE pain-see OT note    OBJECTIVE:  Objective Findings today:   Foot-note weakened arch L foot-flattened arch and decreased active toe flex/ext    Special Tests:SOT, motor control and adaptability performed today.  Note deficits in SOT w/ vestibular system and use of visual system.  Treatment Performed:  THERAPEUTIC EXERCISES  Therapeutic Exercises per Flow Sheet.    THERAPEUTIC ACTIVITIES  high level gait activities-see flow sheet  NEUROMUSCULAR RE-EDUCATION   Activities to promote balanse sensory organization  Static balance activities  mtn pose in standing w/ short foot position, modified tree pose w/ 2 foot support  Education/Home Exercise Program:    Patient was educated on HEP.    Pt. did demonstrate an understanding of this education.    []   No updates to home program today  [x]   HEP  updated:see handout  ASSESSMENT:   Romanita A Panek presents today with Decreased AROM  Decreased Strength   Impaired Balance  Impaired Proprioception  Impaired Gait.  Patient is improving with L hip ext during terminal stance phase of gait.  Pt needed to hold onto wall for braiding exercise today.  Pt had good understanding of foot exercises.  Note despite short foot/improved activation of core, SLS balance is still an issue B.  Patient is progressing well toward goals listed below:    Goals (copied from last note):    1. Pt will be I comprehensive HEP of balance/strength/flexibility ex and carry over on daily basis.  2. Pt will be assessed on neurocom for SOT, adaptability and motor control.  3. Pt will be able to ambulate at normal gait speed with no deviations for community ambulation-as evidenced by the ability to ambulate on TM at 2.0 MPH and will be able to ambulate on inclined TM up to 10% grade w/ full L foot clearance.  4. Pt will improve balance as deomo's by minibest score increasing to 25/28.  5. Pt will easily be able to get down/up from floor to allow her to play and interact with grandchildren, improve her quality of life.    STG Progress Toward Goals   1 Progressing   2 Met    3 Progressing   4 Progressing  5 Not Addressed Today       PLAN:   Patient requires continued skilled intervention in order to increase patient safety and independence with daily activities. and meet above mentioned functional goals.  Focus on core/LLE strength/ balance next visit.    Therapist Signature:  Samul Dada PT [phone]  [facility]  Adult and Pediatric Rehab  [address]  [place], [place] [address]  [phone]

## 2013-10-14 NOTE — Progress Notes (Signed)
[facility]  [address], [address]  [place], [place]  [address]  Phone:  [phone]  Fax:  [phone]    Speech Therapy Daily Note    PATIENT: Katherine [name]    Referred By: Veneda Melter, MD  DOB:   Dec 20, 1950                                 Medical Record #: [id number]  AGE:   63 y.o.                          Primary Diagnosis: CVA (cerebral infarction) [434.91]  Facility Provider #: [phone]   HICN# Patient is not covered by Medicare.    Treatment Diagnosis:  Executive Function/Memory Deficits 799.55    Visit #: SLP Visit  SLP Visit Number: 14  Visit Limit:  30 visits      Certification Period: 05/26/13 to 11/23/13     Time of treatment:   Time Calculation  SLP Received On: 10/14/2013  Start Time: 1400  Stop Time: 1500  Time Calculation (min): 60 min    Medications:  has a current medication list which includes the following prescription(s): atorvastatin, insulin aspart, insulin glargine, insulin syringe 1cc/29g, pramipexole, and rivaroxaban, baclofen    Patient/parent does report changes to medication at this time. (Baclofen)    Patient Goal:  Patient Goal: To improve cognition, return to work and use a computer for return to work.    Therapy Goals:  (Copied from Evaluation)  Goal status updated today.    Short Term Goals:   To be completed by 08/23/13:  1.  Pt will complete Test of Memory and Learning x 100%. *Mastered  2.  Pt will recall memory strategies x 100% I.  *Mastered  3.  Pt will demonstrate use of memory strategies to organize a list of 20 words for recall x 80% with min cues.  *Mastered  4.  Added 07/07/13: Pt will complete convergent naming activities with concrete and abstract items x 90% I.  5.  Added 07/07/13: Pt will complete executive function tasks of reasoning, decision making and self-correction x 90% I using requirements per NeuroPsych Online.     Long Term Goals:  To be completed by 11/23/13:  1.  Pt will answer questions from  visual stimulus of 3-5 minutes x 80% with min cues. *Goal met x 1  2.  Pt will recall a list of 20 words x 90% with self-cues. *Mastered  3.  Pt will demonstrate immediate recall of location of 4-8 objects x 90% with self-cues.  4.  Added 08/12/2013:  Pt will demonstrate ability to complete high-level cognitive tasks, such as deductive reasoning tasks, x 90% with min verbal cues and with auditory distraction.    Subjective:   Pain report:  []   [name] reported today.  [x]   Pain level  5/10 today.  Location:  Arm     Subjective patient report:  "It's right on the tip of my tongue but I can't spit it out."  Pt reported migraines this week and limited practice on computer program.    Objective:  Objective Findings today:   LTG 1: Goal met x 2  STG 4: Goal met x 1. *Improvements noted in organization.    Interventions Performed:   []  SLP provided pt with tactile cues:   []   SLP provided pt with visual cues - none  []  SLP provided pt with verbal cues - none  []  SLP provided pt with phonemic cues   []  Handouts on:   [x]  NeuroPsych Online: computer software focused on predetermined therapy protocol in which the patient is systematically presented with the tracks listed below in hierarchy contingent on completing prior to progression to higher level tasks.   []  Attention Skills   []  Executive Functioning Skills   []  Memory Skills   []  Visualspatial Skills   [x]  Problem Solving Skills   []  Communication Skills  [x]    Photo Cue Cards: Everyday activities.  SLP had pt view each card. for 1 minute then answer 1-2 questions on each card.  []  Other: Mastermind used for visual deductive reasoning.  Visual and auditory distractions provided.    Education/Home Exercise Program:    Patient was educated on progress seen in goals and review of personal goals.  Pt. demonstrated an understanding of this education.    []   No updates to home program today    [x]   HEP updated:  Updated NPO program    Assessment:   Pt continues to have  difficulty with auditory and visual distraction, which would create difficulty for her to return to work.      Patient presents with mild to moderate cognitive changes characterized by memory loss, difficulty with slow auditory comprehension and nonverbal memory tasks, difficulty with judgment and writing. Patient requires skilled therapy intervention to address Assessment: Impaired Memory, as well as the above mentioned deficits to maximize safety with daily activities and return to prior level of function.  Progress Toward Functional Goals: all LTGs     Patient Requires Continued Skilled Care to focus on visual memory in order to increased patient safety and independence.    Plan: Continue with established plan of care with focus on executive function.      Treatment Code # of Minutes   SLP Swallow Treatment [phone])    SLP Treatment Rudean Hitt [phone]) Time Calculation (min): 60 min             Therapist Signature:   Emory Gallentine, MEd, Information systems manager Pathologist/Senior Therapist  [facility]  Pediatric and Adult Rehabilitation [email or web address]  10/14/2013

## 2013-10-15 NOTE — Progress Notes (Signed)
[facility]  Pediatric and Adult Rehabilitation [facility]  [address] [place] [address]  [place] [address]  [phone]    Occupational Therapy Daily Note    PATIENT: Katherine [name]   Referred By: Veneda Melter, MD  DOB: [birth year]     Medical Record #: [id number]  AGE: 63 y.o.    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  Visit: 35; 27    Precautions and Contraindications:  L hemi, fall risk, TIA, h/o DVT    Subjective: "I was up all night because of the spasms."    Objective: Pt presented alone to session.  Pt states she continues to have spasms at night and worsened shoulder pain today.    Neuromuscular Re-Education:   Began session with AAROM of shoulder, elbow, wrist and digits all planes to decrease tone.      Supine ROM with t-bar:  -overhead shoulder flexion L UE 20 reps  -chest press 20 reps  -large circles forward/back 20 reps each  -abduction 20 reps  Min A with grip today    Bioness to decrease spasticity, increase neuromuscular re-ed and function flexion/extension of L UE to perform daily tasks.  Pt placed on Phase of 9, extension setting on 8 and flexion on 3. Performed following functional tasks with Bioness donned on open exercise setting x30 minutes:  - seated functional weight bearing at EOM through palm of hand hand timed during extension of Bioness and CGA x20 reps  -squats with UE shoulder flexion with hand held assist for L UE flexion to 90* 15 reps  -side step with UE abduction with Mod A to reach through shoulder and forearm supination x15 reps  -standing at mat push ups through palm 15 reps    Kinesio taping for wrist extensors with 50% tension; edema taping to back of hand with no tension applied.   Taping to L shoulder for support with 50% tension.    Pain: UE  Shoulder 4/10    Patient Education:   Pt educated on use of Kinesio taping; to remove if any irritation/redness/tingling; to keep on 3-5 days; do not apply heat; wear 3-5 days and then remove.  Pt verbalized understanding of education.    Home Exercise Program: Pt to focus on gentle stretch of UE and scapular strengthening. Pt is to continue with cane and AROM exercises and to increase wearing of Saebo splint to 6hrs/day.  Pt agreed.     Assessment:   Pt with worsening spasms at night since last session and now with increased pain at shoulder and flexion spasm during session causing increased pain and decreased ROM.  Pt continues to benefit from use of Bioness to decrease spasticity and tone.  Pt continues to require skilled OT intervention due to decreased strength, coordination and fine motor skills of L UE limiting her independence with ADL's, functional mobility and IADL's.    Goal Status:  Progressing    Plan:  Patient will benefit from continued skilled therapeutic intervention to address the above functional goals - Continue plan of care with the following special instructions for next visit: continue with weight bearing tasks, re-test ROM; test grip strength  Goals:  Short Term Goals: 6 weeks  1. Pt  will demo increase of active movement of L UE by at least 10* all planes in order to increase functional independence with ADL's and daily tasks.   2. Pt will don/doff shirt/jacket in <30 seconds after set-up with Mod I and no cues in order to increase independence and performance with ADL's.   3. Pt will tolerate full session of bioness paired with weight bearing and ROM activities in order to increase neuromuscular re-education and functional use of L UE.  4. Pt will demo increase in active movement of L UE by performing grasp/release tasks >75% accuracy in order to increase functional use for ADL and feeding tasks.    Long Term Goals: 12 weeks    1. Pt will demo increase of active movement of L UE by at least 20* all planes from initial evaluation in order to increase functional independence with ADL's and daily tasks.  2. Pt will increase L UE fine motor skills required for ADL's and daily tasks through ability to perform 9-hole peg test.  3. Pt will demo increase in functional use of L UE and ability to participate in ADL's and IADLs through increase stroke impact overall recovery scale to >50%.   4. Pt will demo increase in functional use of L UE through ability to demo self-feeding and grooming tasks in appropriate timeframe.      Luvenia Redden MS OTR/L  Occupational Therapist

## 2013-10-18 ENCOUNTER — Ambulatory Visit: Payer: No Typology Code available for payment source

## 2013-10-19 ENCOUNTER — Ambulatory Visit: Payer: No Typology Code available for payment source

## 2013-10-19 DIAGNOSIS — R29818 Other symptoms and signs involving the nervous system: Secondary | ICD-10-CM

## 2013-10-19 DIAGNOSIS — I6350 Cerebral infarction due to unspecified occlusion or stenosis of unspecified cerebral artery: Secondary | ICD-10-CM

## 2013-10-19 DIAGNOSIS — R29898 Other symptoms and signs involving the musculoskeletal system: Secondary | ICD-10-CM

## 2013-10-19 NOTE — Progress Notes (Signed)
[facility]  Pediatric and Adult Rehabilitation [facility]  [address] [place] [address]  [place] [address]  [phone]    Occupational Therapy Daily Note    PATIENT: Katherine [name]   Referred By: Veneda Melter, MD  DOB: December 25, 1950     Medical Record #: [id number]  AGE: 62 y.o.    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  Visit: 36; 28    Precautions and Contraindications:  L hemi, fall risk, TIA, h/o DVT    Subjective: "I still have spasms."    Objective: Pt presented alone to session.  Pt states she continues to have spasms at night and worsened shoulder pain today.  Pt states she just saw her MD and is increasing a dose of Baclofen for after therapy and additional dose at night due to ongoing spasms.  Pt reports she did order bra genie after last session's discussion with therapist and she is now able to put on her bra without assist.    Neuromuscular Re-Education:   Began session with AAROM of shoulder, elbow, wrist and digits all planes to decrease tone.      Supine ROM with t-bar with 2# weight:  -overhead shoulder flexion L UE 10 reps with c/o pain  -chest press 10 reps  Min A with grip today and pt unable to perform further exercises due to c/o pain    Bioness to decrease spasticity, increase neuromuscular re-ed and function flexion/extension of L UE to perform daily tasks.  Pt placed on Phase of 9, extension setting on 8 and flexion on 3. Performed following functional tasks with Bioness donned on open exercise setting x30 minutes:  - seated functional weight bearing at EOM through palm of hand hand timed during extension of Bioness and CGA x20 reps  -squats with UE shoulder flexion with hand held assist for L UE  flexion to 90* 15 reps  -side step with UE abduction with Mod A to reach through shoulder and forearm supination x15 reps  -supine flexion to 90* 20 reps with assist of R UE  -supine shoulder adduction 20 reps with assist of R UE    Kinesio taping for wrist extensors with 50% tension; edema taping to back of hand with no tension applied.  Taping to L shoulder for support with 50% tension.    Pain: UE  Shoulder 4/10    Patient Education:   Pt educated on use of Kinesio taping; to remove if any irritation/redness/tingling; to keep on 3-5 days; do not apply heat; wear 3-5 days and then remove.  Pt verbalized understanding of education.    Home Exercise Program: Pt to focus on gentle stretch of UE and scapular strengthening. Pt is to continue with cane and AROM exercises and to increase wearing of Saebo splint to 6hrs/day.  Pt agreed.     Assessment:   Pt continues with worsening spasms at night and pain in sessions after shoulder flexion and with forearm supination. Pt's dose of Baclofen is being increased to help with spasticity.  Pt with difficulty with t-bar ROM today due to pain; much improved UE positioning and relaxation after use of Bioness.  Pt continues to require skilled OT intervention due to decreased strength, coordination  and fine motor skills of L UE limiting her independence with ADL's, functional mobility and IADL's.    Goal Status:  Progressing    Plan:  Patient will benefit from continued skilled therapeutic intervention to address the above functional goals - Continue plan of care with the following special instructions for next visit: continue with weight bearing tasks, re-test ROM; test grip strength  Goals:  Short Term Goals: 6 weeks  1. Pt will demo increase of active movement of L UE by at least 10* all planes in order to increase functional independence with ADL's and daily tasks.   2. Pt will don/doff shirt/jacket in <30 seconds after set-up with Mod I and no cues in order to increase  independence and performance with ADL's.   3. Pt will tolerate full session of bioness paired with weight bearing and ROM activities in order to increase neuromuscular re-education and functional use of L UE.  4. Pt will demo increase in active movement of L UE by performing grasp/release tasks >75% accuracy in order to increase functional use for ADL and feeding tasks.    Long Term Goals: 12 weeks   1. Pt will demo increase of active movement of L UE by at least 20* all planes from initial evaluation in order to increase functional independence with ADL's and daily tasks.  2. Pt will increase L UE fine motor skills required for ADL's and daily tasks through ability to perform 9-hole peg test.  3. Pt will demo increase in functional use of L UE and ability to participate in ADL's and IADLs through increase stroke impact overall recovery scale to >50%.   4. Pt will demo increase in functional use of L UE through ability to demo self-feeding and grooming tasks in appropriate timeframe.      Luvenia Redden MS OTR/L  Occupational Therapist

## 2013-10-21 ENCOUNTER — Ambulatory Visit: Payer: No Typology Code available for payment source

## 2013-10-25 ENCOUNTER — Ambulatory Visit: Payer: No Typology Code available for payment source

## 2013-10-26 ENCOUNTER — Ambulatory Visit: Payer: No Typology Code available for payment source

## 2013-10-26 DIAGNOSIS — I6350 Cerebral infarction due to unspecified occlusion or stenosis of unspecified cerebral artery: Secondary | ICD-10-CM

## 2013-10-26 DIAGNOSIS — R29818 Other symptoms and signs involving the nervous system: Secondary | ICD-10-CM

## 2013-10-26 DIAGNOSIS — R29898 Other symptoms and signs involving the musculoskeletal system: Secondary | ICD-10-CM

## 2013-10-26 NOTE — Progress Notes (Signed)
[facility]  Pediatric and Adult Rehabilitation [place] [facility]  [address] [place] [address]  [place] [address]  [phone]    Occupational Therapy Daily Note    PATIENT: Katherine [name]   Referred By: Veneda Melter, MD  DOB: [birth year]     Medical Record #: [id number]  AGE: 63 y.o.    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  Visit: 37; 29    Precautions and Contraindications:  L hemi, fall risk, TIA, h/o DVT    Subjective: "My hand is still really tight."    Objective: Pt presented alone to session.  Pt states she continues to have spasms at night and pain during HEP.  Pt states she contacted her MD that the increased dose of Baclofen has not been helping and is waiting to hear back from MD.      Neuromuscular Re-Education:   Began session with AAROM of shoulder, elbow, wrist and digits all planes to decrease tone.  Pt with increased tone with wrist extension.    Supine ROM with t-bar without weight after use of Bioness:  -overhead shoulder flexion L UE 10 reps with c/o pain  -chest press 10 reps  Min A with grip today and pt unable to perform further exercises due to c/o pain    Bioness to decrease spasticity, increase neuromuscular re-ed and function flexion/extension of L UE to perform daily tasks.  Pt placed on Phase of 9, extension setting on 8 and flexion on 3. Performed following functional tasks with Bioness donned on open exercise setting x30 minutes:  - seated functional weight bearing at EOM through palm of hand hand timed during extension of Bioness and CGA x40 reps  -squats with UE shoulder flexion with hand held assist for L UE flexion to 90* 20 reps  -side step with UE abduction with Mod A to reach through  shoulder and forearm supination x20 reps    Kinesio taping for wrist extensors with 50% tension; edema taping to back of hand with no tension applied.  Taping to L shoulder for support with 50% tension.    Pain: UE  Shoulder 4/10    Patient Education:   Pt educated on use of Kinesio taping; to remove if any irritation/redness/tingling; to keep on 3-5 days; do not apply heat; wear 3-5 days and then remove.  Pt verbalized understanding of education.    Home Exercise Program: Pt to focus on gentle stretch of UE and scapular strengthening. Pt is to continue with cane and AROM exercises and to increase wearing of Saebo splint to 6hrs/day.  Pt agreed.     Assessment:   Pt continues with spasms and increased tone of L UE.  Pt reports she had to send back the music glove and this did really help her fine motor dexterity.   Pt with improved tone after use of Bioness and anticipate tone will improve with medication re-adjustment.  Pt continues to require skilled OT intervention due to decreased strength, coordination and fine motor skills of L UE limiting her independence with ADL's, functional mobility and IADL's.    Goal Status:  Progressing    Plan:  Patient will benefit from continued skilled therapeutic intervention to address the above functional  goals - Continue plan of care with the following special instructions for next visit: continue with weight bearing tasks, re-test ROM; test grip strength  Goals:  Short Term Goals: 6 weeks  1. Pt will demo increase of active movement of L UE by at least 10* all planes in order to increase functional independence with ADL's and daily tasks.   2. Pt will don/doff shirt/jacket in <30 seconds after set-up with Mod I and no cues in order to increase independence and performance with ADL's.   3. Pt will tolerate full session of bioness paired with weight bearing and ROM activities in order to increase neuromuscular re-education and functional use of L UE.  4. Pt will demo increase in  active movement of L UE by performing grasp/release tasks >75% accuracy in order to increase functional use for ADL and feeding tasks.    Long Term Goals: 12 weeks   1. Pt will demo increase of active movement of L UE by at least 20* all planes from initial evaluation in order to increase functional independence with ADL's and daily tasks.  2. Pt will increase L UE fine motor skills required for ADL's and daily tasks through ability to perform 9-hole peg test.  3. Pt will demo increase in functional use of L UE and ability to participate in ADL's and IADLs through increase stroke impact overall recovery scale to >50%.   4. Pt will demo increase in functional use of L UE through ability to demo self-feeding and grooming tasks in appropriate timeframe.      Luvenia Redden MS OTR/L  Occupational Therapist

## 2013-10-28 ENCOUNTER — Ambulatory Visit: Payer: No Typology Code available for payment source

## 2013-10-28 DIAGNOSIS — R41844 Frontal lobe and executive function deficit: Secondary | ICD-10-CM

## 2013-10-28 DIAGNOSIS — R29898 Other symptoms and signs involving the musculoskeletal system: Secondary | ICD-10-CM

## 2013-10-28 DIAGNOSIS — R29818 Other symptoms and signs involving the nervous system: Secondary | ICD-10-CM

## 2013-10-28 DIAGNOSIS — R269 Unspecified abnormalities of gait and mobility: Secondary | ICD-10-CM

## 2013-10-28 DIAGNOSIS — I6350 Cerebral infarction due to unspecified occlusion or stenosis of unspecified cerebral artery: Secondary | ICD-10-CM

## 2013-10-28 NOTE — Progress Notes (Signed)
[facility]  [address], [address]  [place], [place]  [address]  Phone:  [phone]  Fax:  [phone]    PHYSICAL THERAPY DAILY TREATMENT NOTE    PATIENT: Katherine [name] DOB: 08-Aug-1950   MR #: [id number]  AGE: 63 y.o.    REFERRING MD:    PRIMARY MD: Katherine Melter, MD (General)    CERTIFICATION DATES:     7-2-115 to 12-31-13 DIAGNOSES: CVA (cerebral infarction) [434.91]          Date of Service PT Received On: 10/28/13   Treatment Time Start Time: 1300 to Stop Time: 1400   Time Calculation Time Calculation (min): 60 min   Visit #  3   Units Billed   Therapeutic Interventions  $ PT Therapeutic Exercise (97110): 23-37 mins  $ PT Therapeutic Activity (97530): 2 units       Precautions/Contraindications:fall risk    Medications:  has a current medication list which includes the following prescription(s): atorvastatin, baclofen, insulin aspart, insulin glargine, insulin syringe 1cc/29g, pramipexole, and rivaroxaban.  Patient/Caregiver does not report changes to medication at this time.    SUBJECTIVE:   Pt reports to start medicaid August 1st.   Pain report:  7 on a scale of 1 - 10 L mid upper arm radiating down to wrist and up to neck from spasming in biceps muscle.  taking baclofen and considering botox.  Pain worsens after OT     OBJECTIVE:  Objective Findings today:     Palpation note TP w/ tenderness mid bicep muscle belly      Treatment Performed:  THERAPEUTIC EXERCISES  Therapeutic Exercises per Flow Sheet.    TA: gait activities and balance ex as per flow sheet.  1# weight to L wrist to help inc elbow extension.  Hands held behind back  W/ scap retraction while doing wedding march walk to stretch ant shoulder/biceps  Alt front lunge onto blue foam to fac ankle strategy and stepping reaction in event of forward LOB  Manual stretch L biceps after gentle massage of biceps to decrease pain/spasm  Education/Home Exercise Program:    Patient was  educated on wedding march to work on Editor, commissioning.  Pt. did demonstrate an understanding of this education.    []   No updates to home program today  []   HEP updated:    ASSESSMENT:   Katherine [name] presents today with Decreased Strength   Impaired Balance  Impaired Proprioception  Impaired Gait.  Patient is improving with quality of gait-less trendelenburg noted with SLS LLE.  Noe difficulty placing L oot behind with braiding to R.  Some LOB in front lunge position on foam-needs UE A to stabilize.  Patient is progressing well toward goals listed below:    Goals (copied from last note):    1. Pt will be I comprehensive HEP of balance/strength/flexibility ex and carry over on daily basis.  2. Pt will be assessed on neurocom for SOT, adaptability and motor control.(met)  3. Pt will be able to ambulate at normal gait speed with no deviations for community ambulation-as evidenced by the ability to ambulate on TM at 2.0 MPH and will be able to ambulate on inclined TM up to 10% grade w/ full L foot clearance.  4. Pt will improve balance as deomo's by minibest score increasing to 25/28.  5. Pt will easily be able to get down/up from floor to allow her to play and interact  with grandchildren, improve her quality of life.    STG Progress Toward Goals   1 Progressing   2 Met    3 Progressing   4 Progressing   5 Not Addressed Today       PLAN:   Patient requires continued skilled intervention in order to increase patient safety and independence with daily activities. and meet above mentioned functional goals.  Focus on floor tx and quad strength/mini squats next visit.    Therapist Signature:  Samul Dada PT [phone]  [facility]  Adult and Pediatric Rehab  [address] [place]  [place], [place] [address]  [phone]

## 2013-10-29 NOTE — Progress Notes (Signed)
[facility]  Pediatric and Adult Rehabilitation [facility]  [address]  [place] [address]  [phone]    Occupational Therapy Daily Note    PATIENT: Katherine [name]   Referred By: Veneda Melter, MD  DOB: 13-Sep-1950     Medical Record #: [id number]  AGE: 63 y.o.    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  Visit: 38; 30    Precautions and Contraindications:  L hemi, fall risk, TIA, h/o DVT    Subjective: "I still cant get these spasms under control"    Objective: Pt presented alone to session.  Pt states she continues to have spasms at night and pain during HEP.  Pt states she is still waiting to hear back from MD.      Neuromuscular Re-Education:   Began session with AAROM of shoulder, elbow, wrist and digits all planes to decrease tone.  Pt with increased tone with wrist extension.  Progressed to donning Bioness due to pt's level of pain and spasticity after trial of T-bar exercises supine for ROM, but pt unable to tolerate due to pain and spasticity.    Bioness to decrease spasticity, increase neuromuscular re-ed and function flexion/extension of L UE to perform daily tasks.  Pt placed on Phase of 9, extension setting on 8 and flexion on 3. Performed following functional tasks with Bioness donned on open exercise setting x30 minutes:  - seated functional weight bearing at EOM through palm of hand hand timed during extension of Bioness and CGA x40 reps  -squats with UE shoulder flexion with hand held assist for L UE flexion to 90* 20 reps  -side step with UE abduction with Mod A to reach through shoulder and forearm supination x20 reps    Resumed supine ROM with Bioness removed:  -shoulder abduction with  static stretch held at 90* 2 reps  -elbow flexion/extension 5 reps  -shoulder overhead flexion with static stretch hold; pt with significant spasm after stretch 10/10 and required stopping and sitting up    Manual deep massage with deep massage prep lotion to L bicep due to increased pain with spasm and tight muscle mass; pt did have improved comfort level after massage.  Ice applied to area x15 minutes while kinesio tape donned to decrease pain    Kinesio taping for wrist extensors with 50% tension; edema taping to back of hand with no tension applied.  Taping to L shoulder for support with 50% tension.    Pain: UE  Shoulder ranging from 10/10 to 4/10    Patient Education:   Pt educated on use of Kinesio taping; to remove if any irritation/redness/tingling; to keep on 3-5 days; do not apply heat; wear 3-5 days and then remove.  Pt verbalized understanding of education.    Home Exercise Program: Pt to focus on gentle stretch of UE and scapular strengthening. Pt is to continue with cane and AROM exercises and to increase wearing of Saebo splint to 6hrs/day.  Pt agreed.     Assessment:   Pt continues with increased tone of L UE and two episodes of spasms of L UE during treatment session requiring break and stopping activity.   Pt with limited return of AROM  due to ongoing spasticity, but if this is managed feel she could progress well with ROM and strengthening.  Pt to discuss adjustment of Baclofen and possible botox with MD..  Pt continues to require skilled OT intervention due to decreased strength, coordination and fine motor skills of L UE limiting her independence with ADL's, functional mobility and IADL's.    Goal Status:  Progressing    Plan:  Patient will benefit from continued skilled therapeutic intervention to address the above functional goals - Continue plan of care with the following special instructions for next visit: continue with weight bearing tasks, re-test ROM; test grip strength  Goals:  Short  Term Goals: 6 weeks  1. Pt will demo increase of active movement of L UE by at least 10* all planes in order to increase functional independence with ADL's and daily tasks.   2. Pt will don/doff shirt/jacket in <30 seconds after set-up with Mod I and no cues in order to increase independence and performance with ADL's.   3. Pt will tolerate full session of bioness paired with weight bearing and ROM activities in order to increase neuromuscular re-education and functional use of L UE.  4. Pt will demo increase in active movement of L UE by performing grasp/release tasks >75% accuracy in order to increase functional use for ADL and feeding tasks.    Long Term Goals: 12 weeks   1. Pt will demo increase of active movement of L UE by at least 20* all planes from initial evaluation in order to increase functional independence with ADL's and daily tasks.  2. Pt will increase L UE fine motor skills required for ADL's and daily tasks through ability to perform 9-hole peg test.  3. Pt will demo increase in functional use of L UE and ability to participate in ADL's and IADLs through increase stroke impact overall recovery scale to >50%.   4. Pt will demo increase in functional use of L UE through ability to demo self-feeding and grooming tasks in appropriate timeframe.      Luvenia Redden MS OTR/L  Occupational Therapist

## 2013-11-02 ENCOUNTER — Ambulatory Visit: Payer: No Typology Code available for payment source

## 2013-11-02 DIAGNOSIS — R279 Unspecified lack of coordination: Secondary | ICD-10-CM

## 2013-11-02 DIAGNOSIS — R29898 Other symptoms and signs involving the musculoskeletal system: Secondary | ICD-10-CM

## 2013-11-02 DIAGNOSIS — I6350 Cerebral infarction due to unspecified occlusion or stenosis of unspecified cerebral artery: Secondary | ICD-10-CM

## 2013-11-02 DIAGNOSIS — I69998 Other sequelae following unspecified cerebrovascular disease: Secondary | ICD-10-CM | POA: Insufficient documentation

## 2013-11-04 ENCOUNTER — Ambulatory Visit: Payer: No Typology Code available for payment source

## 2013-11-04 NOTE — Progress Notes (Addendum)
[facility]  Pediatric and Adult Rehabilitation [facility]  [address]  [place] [address]  [phone]    Occupational Therapy Daily Note    PATIENT: Katherine [name]   Referred By: Veneda Melter, MD  DOB: December 31, 1950     Medical Record #: [id number]  AGE: 63 y.o.    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  Visit: 39; 31    Precautions and Contraindications:  L hemi, fall risk, TIA, h/o DVT    Subjective: "I still have spasms at night."    Objective: Pt presented alone to session.  Pt states she continues to have spasms at night and pain during HEP.  Pt states she is still waiting to hear back from MD.      Neuromuscular Re-Education:   Began session with AAROM of shoulder, elbow, wrist and digits all planes to decrease tone.  Pt with increased tone with wrist extension.  Progressed to donning Bioness due to pt's level of pain and spasticity.    Bioness to decrease spasticity, increase neuromuscular re-ed and function flexion/extension of L UE to perform daily tasks.  Pt placed on Phase of 9, extension setting on 8 and flexion on 3. Performed following functional tasks with Bioness donned on open exercise setting x30 minutes:  - seated functional weight bearing at EOM through palm of hand hand timed during extension of Bioness and CGA x40 reps  - Rhythmic shoulder flexion pattern to 90* timed with extension of digits with Bioness required Min A for movement  - rhythmic shoulder abduction pattern to 90* timed with extension of digits with Bioness required Mod A for movement  -gross grasp foam squeeze ball with timing of grasp/release of Bioness with CGA-Min A x15 reps.  Pt unable to coordinate placing item into  bucket.      Ice applied to area x15 minutes while kinesio tape donned to decrease pain    Kinesio taping for wrist extensors with 50% tension; edema taping to back of hand with no tension applied.  Taping to L shoulder for support with 50% tension.    Pain: UE  Shoulder ranging from 8/10 to 4/10    Patient Education:   Pt educated on use of Kinesio taping; to remove if any irritation/redness/tingling; to keep on 3-5 days; do not apply heat; wear 3-5 days and then remove.  Pt verbalized understanding of education.    Home Exercise Program: Pt to focus on gentle stretch of UE and scapular strengthening. Pt is to continue with cane and AROM exercises and to increase wearing of Saebo splint to 6hrs/day.  Pt agreed.     Assessment:   Pt continues with ongoing spasticity of UE and spasms limiting functional ROM and gains for upper extremity. Pt to discuss adjustment of Baclofen and possible botox with MD.  Pt does benefit from Bioness to decrease spasticity but plan for break in service in next few weeks for pt to continue with HEP and manage spasms medically.  Pt continues to require skilled OT intervention due to decreased strength, coordination and fine motor skills of L UE limiting her independence with ADL's, functional mobility and IADL's.    Goal Status:  Progressing  Plan:  Patient will benefit from continued skilled therapeutic intervention to address the above functional goals - Continue plan of care with the following special instructions for next visit: continue with weight bearing tasks, re-test ROM; test grip strength  Goals:  Short Term Goals: 6 weeks  1. Pt will demo increase of active movement of L UE by at least 10* all planes in order to increase functional independence with ADL's and daily tasks.   2. Pt will don/doff shirt/jacket in <30 seconds after set-up with Mod I and no cues in order to increase independence and performance with ADL's.   3. Pt will tolerate full session of bioness paired with  weight bearing and ROM activities in order to increase neuromuscular re-education and functional use of L UE.  4. Pt will demo increase in active movement of L UE by performing grasp/release tasks >75% accuracy in order to increase functional use for ADL and feeding tasks.    Long Term Goals: 12 weeks   1. Pt will demo increase of active movement of L UE by at least 20* all planes from initial evaluation in order to increase functional independence with ADL's and daily tasks.  2. Pt will increase L UE fine motor skills required for ADL's and daily tasks through ability to perform 9-hole peg test.  3. Pt will demo increase in functional use of L UE and ability to participate in ADL's and IADLs through increase stroke impact overall recovery scale to >50%.   4. Pt will demo increase in functional use of L UE through ability to demo self-feeding and grooming tasks in appropriate timeframe.      Luvenia Redden MS OTR/L  Occupational Therapist

## 2013-11-09 ENCOUNTER — Ambulatory Visit: Payer: No Typology Code available for payment source

## 2013-11-09 NOTE — Progress Notes (Signed)
[facility]  [address], [address]  [place], [place]  [address]  Phone:  [phone]  Fax:  [phone]    Speech Therapy Daily Note    PATIENT: Katherine [name]    Referred By: Veneda Melter, MD  DOB:   1951/02/26                                 Medical Record #: [id number]  AGE:   63 y.o.                          Primary Diagnosis: CVA (cerebral infarction) [434.91]  Facility Provider #: [phone]   HICN# Patient is not covered by Medicare.    Treatment Diagnosis:  Executive Function/Memory Deficits 799.55    Visit #: SLP Visit  SLP Visit Number: 15  Visit Limit:  30 visits      Certification Period: 05/26/13 to 11/23/13     Time of treatment:   Time Calculation  SLP Received On: 10/28/2013  Start Time: 1400  Stop Time: 1500  Time Calculation (min): 60 min    Medications:  has a current medication list which includes the following prescription(s): atorvastatin, insulin aspart, insulin glargine, insulin syringe 1cc/29g, pramipexole, and rivaroxaban, baclofen    Patient/parent does report changes to medication at this time. (Baclofen)    Patient Goal:  Patient Goal: To improve cognition, return to work and use a computer for return to work.    Therapy Goals:  (Copied from Evaluation)  Goal status updated today.    Short Term Goals:   To be completed by 08/23/13:  1.  Pt will complete Test of Memory and Learning x 100%. *Mastered  2.  Pt will recall memory strategies x 100% I.  *Mastered  3.  Pt will demonstrate use of memory strategies to organize a list of 20 words for recall x 80% with min cues.  *Mastered  4.  Added 07/07/13: Pt will complete convergent naming activities with concrete and abstract items x 90% I.  5.  Added 07/07/13: Pt will complete executive function tasks of reasoning, decision making and self-correction x 90% I using requirements per NeuroPsych Online.     Long Term Goals:  To be completed by 11/23/13:  1.  Pt will answer questions from  visual stimulus of 3-5 minutes x 80% with min cues. *Goal met x 2  2.  Pt will recall a list of 20 words x 90% with self-cues. *Mastered  3.  Pt will demonstrate immediate recall of location of 4-8 objects x 90% with self-cues.  4.  Added 08/12/2013:  Pt will demonstrate ability to complete high-level cognitive tasks, such as deductive reasoning tasks, x 90% with min verbal cues and with auditory distraction.    Subjective:   Pain report:  []   [name] reported today.  [x]   Pain level  5/10 today.  Location:  Arm     Subjective patient report:  Patient reports continued difficulty with NPO programs.  Agreed with SLP that she could increase tx per week.    Objective:  Objective Findings today:   LTG 4: Pt required min-mod verbal cues to return to task (of note: OT reports significant difficulty in divided attention with physical goals if she is carrying on a conversation).  Track 4: Visuospatial Skills  Task 4: Design Completion    *  This visual processing and planning task requires one to analyze lines and angles to match exserts to their parent patterns.    *Requirements: 100% accuracy on all levels    Level: 1  # targets: 5  # clicks: 6  % correct: 93%  # errors: 1  Pass: No  Track 5: Problem Solving Skills  Task 1: Deduce It    *Pt to analyze the written clues provided to deduce the numbers represented (1-3 digits, increasing in complexity by level.    **Requirements: 100% accuracy at all levels out of 5 presentations per task.    Level: 2 and 3  # correct: 5 and 3  Pass: No    STG 5:   Track 2: Barista  Task 2: Attributes and Groups    *One must be able to accurately analyze and compare attributes of the objects presented.  Based upon the analysis the objects must be placed in a grid so that rows, columns and corners have like attributes.  In level 4, pt must identify common attribute based on presentations.    Requirements for pass:  100% accuracy    Today's results:    Level:3  # errors: 2  Pass:  No    Interventions Performed:   []  SLP provided pt with tactile cues:   [x]  SLP provided pt with visual cues - min  [x]  SLP provided pt with verbal cues - mod  []  SLP provided pt with phonemic cues   []  Handouts on:   [x]  NeuroPsych Online: computer software focused on predetermined therapy protocol in which the patient is systematically presented with the tracks listed below in hierarchy contingent on completing prior to progression to higher level tasks.   []  Attention Skills   [x]  Executive Functioning Skills   []  Memory Skills   [x]  Visualspatial Skills   [x]  Problem Solving Skills   []  Communication Skills  []    Photo Cue Cards: Everyday activities.  SLP had pt view each card. for 1 minute then answer 1-2 questions on each card.  []  Other: Mastermind used for visual deductive reasoning.  Visual and auditory distractions provided.    Education/Home Exercise Program:    Patient was educated on progress seen in goals and review of personal goals.  Pt. demonstrated an understanding of this education.    []   No updates to home program today    [x]   HEP updated:  Updated NPO program    Assessment:   Pt continues to have difficulty with auditory and visual distraction, which would create difficulty for her to return to work.      Patient presents with mild to moderate cognitive changes characterized by memory loss, difficulty with slow auditory comprehension and nonverbal memory tasks, difficulty with judgment and writing. Patient requires skilled therapy intervention to address Assessment: Impaired Memory, as well as the above mentioned deficits to maximize safety with daily activities and return to prior level of function.  Progress Toward Functional Goals: all LTGs     Patient Requires Continued Skilled Care to focus on visual memory in order to increased patient safety and independence.    Plan: Continue with established plan of care with focus on executive function.      Treatment Code # of Minutes   SLP Swallow  Treatment [phone])    SLP Treatment Rudean Hitt [phone]) Time Calculation (min): 60 min             Therapist Signature:   Leasa Kincannon, MEd, CCC-SLP  Speech-Language Pathologist/Senior Therapist  [facility]  Pediatric and Adult Rehabilitation [email or web address]  10/28/13

## 2013-11-11 ENCOUNTER — Ambulatory Visit: Payer: No Typology Code available for payment source

## 2013-11-11 DIAGNOSIS — R41844 Frontal lobe and executive function deficit: Secondary | ICD-10-CM

## 2013-11-11 DIAGNOSIS — R269 Unspecified abnormalities of gait and mobility: Secondary | ICD-10-CM

## 2013-11-11 NOTE — Progress Notes (Signed)
[facility]  [address], [address]  [place], [place]  [address]  Phone:  [phone]  Fax:  [phone]    PHYSICAL THERAPY DAILY TREATMENT NOTE    PATIENT: Katherine [name] DOB: [birth year]   MR #: [id number]  AGE: 63 y.o.    REFERRING MD:  Veneda Melter, MD PRIMARY MD: Veneda Melter, MD (General)    CERTIFICATION DATES:     09-30-13 to 12-31-13 DIAGNOSES: CVA (cerebral infarction) [434.91]          Date of Service PT Received On: 11/11/13   Treatment Time Start Time: 1300 to Stop Time: 1345   Time Calculation Time Calculation (min): 45 min   Visit #  4   Units Billed   Therapeutic Interventions  $ PT Therapeutic Exercise (97110): 8-22 mins  $ PT Therapeutic Activity (97530): 2 units       Precautions/Contraindications:  Fall Risk    Medications:  has a current medication list which includes the following prescription(s): atorvastatin, baclofen, insulin aspart, insulin glargine, insulin syringe 1cc/29g, pramipexole, and rivaroxaban.  Patient/Caregiver does not report changes to medication at this time.    SUBJECTIVE:   Has been icing L biceps and using massager to biceps.  Usually wakes up in night d/t spasms but less pain in general with ice/massage.  Pain report:  7/8 on a scale of 1 - 10 L biceps area    OBJECTIVE:  Treatment Performed:  THERAPEUTIC EXERCISES  Therapeutic Exercises per Flow Sheet.    IO:NGEX level gait activities: walk BIG w/ 2# weight on L wrist to facilitate elbow ext and arm swing, fast walk, walk F/B switch directions, high step sideways, braiding R/L switching, step F and sideways over 8" beam, tandem walk; wedding march practiced stand<>floor tx with railing for support, 1/2 kneel with L foot flat weight shift F/B, PNF diagonals with R hand holdling L hand, tall kneeling w/ diagonal lift UE's  Palpation of biceps reveals no TP as felt in past.  Therapist passively stretched biceps for pt.  Education/Home Exercise  Program:    Patient was educated on S/L hip abd.  How to stretch biceps  Pt. did demonstrate an understanding of this education.    []   No updates to home program today  [x]   HEP updated:hip abd  ASSESSMENT:   Ashlei A Mol presents today with Decreased Strength   Impaired Balance  Impaired Gait.  Patient is improving with high level balance/gait activities.  Good technique for floor transfers.  Patient is progressing well toward goals listed below:    Goals (copied from last note):  1. Pt will be I comprehensive HEP of balance/strength/flexibility ex and carry over on daily basis.  2. Pt will be assessed on neurocom for SOT, adaptability and motor control.(met)  3. Pt will be able to ambulate at normal gait speed with no deviations for community ambulation-as evidenced by the ability to ambulate on TM at 2.0 MPH and will be able to ambulate on inclined TM up to 10% grade w/ full L foot clearance.  4. Pt will improve balance as deomo's by minibest score increasing to 25/28.  5. Pt will easily be able to get down/up from floor to allow her to play and interact with grandchildren, improve her quality of life.    STG Progress Toward Goals   1 Progressing   2 Met    3 Progressing   4 Progressing   5  Progressing       PLAN:   Patient requires continued skilled intervention in order to increase patient safety and independence with daily activities. and meet above mentioned functional goals.  Focus on walking on TM next visit.    Therapist Signature:  Samul Dada PT [phone]  [facility]  Adult and Pediatric Rehab  [address]  [place], [place] [address]  [phone]

## 2013-11-11 NOTE — Progress Notes (Signed)
[facility]  [address]  [place], [place]  [address]  Phone:  [phone]  Fax:  [phone]    Speech Therapy Daily Note    PATIENT: Katherine [name]    Referred By: Veneda Melter, MD  DOB:   01/24/1951                                 Medical Record #: [id number]  AGE:   63 y.o.                          Primary Diagnosis: CVA (cerebral infarction) [434.91]  Facility Provider #: [phone]   HICN# Patient is not covered by Medicare.    Treatment Diagnosis:  Executive Function/Memory Deficits 799.55    Visit #: SLP Visit  SLP Visit Number: 16  Visit Limit:  30 visits      Certification Period: 05/26/13 to 11/23/13     Time of treatment:   Time Calculation  SLP Received On: 11/11/2013  Start Time: 1345  Stop Time: 1430  Time Calculation (min): 45 min    Medications:  has a current medication list which includes the following prescription(s): atorvastatin, insulin aspart, insulin glargine, insulin syringe 1cc/29g, pramipexole, and rivaroxaban, baclofen    Patient/parent does report changes to medication at this time. (Baclofen)    Patient Goal:  Patient Goal: To improve cognition, return to work and use a computer for return to work.    Therapy Goals:  (Copied from Evaluation)  Goal status updated today.    Short Term Goals:   To be completed by 08/23/13:  1.  Pt will complete Test of Memory and Learning x 100%. *Mastered  2.  Pt will recall memory strategies x 100% I.  *Mastered  3.  Pt will demonstrate use of memory strategies to organize a list of 20 words for recall x 80% with min cues.  *Mastered  4.  Added 07/07/13: Pt will complete convergent naming activities with concrete and abstract items x 90% I.  5.  Added 07/07/13: Pt will complete executive function tasks of reasoning, decision making and self-correction x 90% I using requirements per NeuroPsych Online.     Long Term Goals:  To be completed by 11/23/13:  1.  Pt will answer questions from  visual stimulus of 3-5 minutes x 80% with min cues. *Goal met x 2  2.  Pt will recall a list of 20 words x 90% with self-cues. *Mastered  3.  Pt will demonstrate immediate recall of location of 4-8 objects x 90% with self-cues.  4.  Added 08/12/2013:  Pt will demonstrate ability to complete high-level cognitive tasks, such as deductive reasoning tasks, x 90% with min verbal cues and with auditory distraction.    Subjective:   Pain report:  []   [name] reported today.  [x]   Pain level  5/10 today.  Location:  Arm     Subjective patient report:  Patient missed last week's tx session due to illness.  She had an appt with cardiac today, so she started and ended tx late to accommodate.     Objective:  Objective Findings today:   LTG 4: completed 3x4 deductive reasoning with min verbal cues x 100%.  STG 5: NPO program below.  Track 5: Problem Solving Skills  Task 1: Deduce It    *  Pt to analyze the written clues provided to deduce the numbers represented (1-3 digits, increasing in complexity by level.    **Requirements: 100% accuracy at all levels out of 5 presentations per task.    Level: 3  # correct: 5  Pass: yes    STG 5:   Track 2: Barista  Task 2: Attributes and Groups    *One must be able to accurately analyze and compare attributes of the objects presented.  Based upon the analysis the objects must be placed in a grid so that rows, columns and corners have like attributes.  In level 4, pt must identify common attribute based on presentations.    Requirements for pass:  100% accuracy    Today's results:    Level:3  # errors: 0  Pass: yes    Interventions Performed:   []  SLP provided pt with tactile cues:   [x]  SLP provided pt with visual cues - min  [x]  SLP provided pt with verbal cues - mod  []  SLP provided pt with phonemic cues   []  Handouts on:   [x]  NeuroPsych Online: computer software focused on predetermined therapy protocol in which the patient is systematically presented with the tracks listed below  in hierarchy contingent on completing prior to progression to higher level tasks.   []  Attention Skills   [x]  Executive Functioning Skills   []  Memory Skills   [x]  Visualspatial Skills   [x]  Problem Solving Skills   []  Communication Skills  []    Photo Cue Cards: Everyday activities.  SLP had pt view each card. for 1 minute then answer 1-2 questions on each card.  []  Other: Mastermind used for visual deductive reasoning.  Visual and auditory distractions provided.    Education/Home Exercise Program:    Patient was educated on progress seen in goals and review of personal goals.  Pt. demonstrated an understanding of this education.    []   No updates to home program today    [x]   HEP updated:  Updated NPO program, provided add'l deductive reasoning.     Assessment:   Pt presented with marked improvements in tasks today, although has not passed in HEP without SLP assistance.       Patient presents with mild to moderate cognitive changes characterized by memory loss, difficulty with slow auditory comprehension and nonverbal memory tasks, difficulty with judgment and writing. Patient requires skilled therapy intervention to address Assessment: Impaired Memory, as well as the above mentioned deficits to maximize safety with daily activities and return to prior level of function.  Progress Toward Functional Goals: all LTGs     Patient Requires Continued Skilled Care to focus on visual memory in order to increased patient safety and independence.    Plan: Continue with established plan of care with focus on executive function.      Treatment Code # of Minutes   SLP Swallow Treatment [phone])    SLP Treatment Rudean Hitt [phone]) Time Calculation (min): 60 min             Therapist Signature:   Howie Rufus, MEd, Information systems manager Pathologist/Senior Therapist  [facility]  Pediatric and Adult Rehabilitation [email or web address]  11/11/2013

## 2013-11-16 ENCOUNTER — Ambulatory Visit: Payer: No Typology Code available for payment source

## 2013-11-18 ENCOUNTER — Ambulatory Visit: Payer: No Typology Code available for payment source

## 2013-11-25 ENCOUNTER — Ambulatory Visit: Payer: No Typology Code available for payment source

## 2013-11-25 DIAGNOSIS — R41844 Frontal lobe and executive function deficit: Secondary | ICD-10-CM

## 2013-11-25 DIAGNOSIS — R269 Unspecified abnormalities of gait and mobility: Secondary | ICD-10-CM

## 2013-11-25 NOTE — Progress Notes (Signed)
[facility] [place]  [address], [address]  [place], [place]  [address]  Phone:  [phone]  Fax:  [phone]    Speech Therapy Daily Note    PATIENT: Katherine [name]    Referred By: Veneda Melter, MD  DOB:   1950/10/24                                 Medical Record #: [id number]  AGE:   63 y.o.                          Primary Diagnosis: CVA (cerebral infarction) [434.91]  Facility Provider #: [phone]   HICN# Patient is not covered by Medicare.    Treatment Diagnosis:  Executive Function/Memory Deficits 799.55    Visit #: SLP Visit  SLP Visit Number: 17  Visit Limit:  30 visits      Certification Period: 05/26/13 to 11/23/13     Time of treatment:   Time Calculation  SLP Received On: 11/25/2013  Start Time: 1400  Stop Time: 1500  Time Calculation (min): 60 min    Medications:  has a current medication list which includes the following prescription(s): atorvastatin, insulin aspart, insulin glargine, insulin syringe 1cc/29g, pramipexole, and rivaroxaban, baclofen    Patient/parent does report changes to medication at this time. (Baclofen)    Patient Goal:  Patient Goal: To improve cognition, return to work and use a computer for return to work.    Therapy Goals:  (Copied from Evaluation)  Goal status updated today.    Short Term Goals:   To be completed by 08/23/13:  1.  Pt will complete Test of Memory and Learning x 100%. *Mastered  2.  Pt will recall memory strategies x 100% I.  *Mastered  3.  Pt will demonstrate use of memory strategies to organize a list of 20 words for recall x 80% with min cues.  *Mastered  4.  Added 07/07/13: Pt will complete convergent naming activities with concrete and abstract items x 90% I.  5.  Added 07/07/13: Pt will complete executive function tasks of reasoning, decision making and self-correction x 90% I using requirements per NeuroPsych Online.     Long Term Goals:  To be completed by 11/23/13:  1.  Pt will answer questions from  visual stimulus of 3-5 minutes x 80% with min cues. *Mastered  2.  Pt will recall a list of 20 words x 90% with self-cues. *Mastered  3.  Pt will demonstrate immediate recall of location of 4-8 objects x 90% with self-cues.  4.  Added 08/12/2013:  Pt will demonstrate ability to complete high-level cognitive tasks, such as deductive reasoning tasks, x 90% with min verbal cues and with auditory distraction.    Subjective:   Pain report:  []   [name] reported today.  [x]   Pain level  5/10 today.  Location:  Arm     Subjective patient report:  Patient missed last week's tx for vacation.  "I haven't been very good about my homework since my computer crashed."    Objective:  Objective Findings today:   LTG 4, STG 5: NPO program below.  Track 5: Problem Solving Skills  Task 1: Deduce It    *Pt to analyze the written clues provided to deduce the numbers represented (1-3 digits, increasing in complexity by level.    **  Requirements: 100% accuracy at all levels out of 5 presentations per task.    Level: 3  # correct: 3  Pass: No    Track 6: Communication Skills  Task 7: Follow My Instruction (written)  Task 8: Follow My Instruction (vocal)    *Written/verbal instruction are presented ranging from one part in Level 1 to multistep with qualifiers (i.e. "Before this" and "after that") for Level 4.  For Level 4, one must recall the instructions as they are erased from the screen during the response phase.    **Requirements: 100% accuracy at all levels    Written/Verbal    Level: 1, 1 and 2  # errors: 0, 0, 1  Pass: Yes, yes, no    Track 2: Barista  Task 2: Attributes and Groups    *One must be able to accurately analyze and compare attributes of the objects presented.  Based upon the analysis the objects must be placed in a grid so that rows, columns and corners have like attributes.  In level 4, pt must identify common attribute based on presentations.    Requirements for pass:  100% accuracy    Today's  results:    Level:3  # errors: 1  Pass: No    Interventions Performed:   []  SLP provided pt with tactile cues:   [x]  SLP provided pt with visual cues - none  [x]  SLP provided pt with verbal cues - min  []  SLP provided pt with phonemic cues   []  Handouts on:   [x]  NeuroPsych Online: computer software focused on predetermined therapy protocol in which the patient is systematically presented with the tracks listed below in hierarchy contingent on completing prior to progression to higher level tasks.   []  Attention Skills   [x]  Executive Functioning Skills   []  Memory Skills   []  Visualspatial Skills   [x]  Problem Solving Skills   [x]  Communication Skills  []    Photo Cue Cards: Everyday activities.  SLP had pt view each card. for 1 minute then answer 1-2 questions on each card.  []  Other: Mastermind used for visual deductive reasoning.  Visual and auditory distractions provided.    Education/Home Exercise Program:    Patient was educated on concepts for NPO programs and where to find errors to fix.  Pt. demonstrated an understanding of this education.    []   No updates to home program today    [x]   HEP updated:  Updated NPO program     Assessment:   Pt presented with marked improvements in tasks today, although has not passed in HEP without SLP assistance.       Patient presents with mild to moderate cognitive changes characterized by memory loss, difficulty with slow auditory comprehension and nonverbal memory tasks, difficulty with judgment and writing. Patient requires skilled therapy intervention to address Assessment: Impaired Memory, as well as the above mentioned deficits to maximize safety with daily activities and return to prior level of function.  Progress Toward Functional Goals: all LTGs     Patient Requires Continued Skilled Care to focus on visual memory in order to increased patient safety and independence.    Plan: Continue with established plan of care with focus on executive function.      Treatment  Code # of Minutes   SLP Swallow Treatment [phone])    SLP Treatment Indiv [phone]) Time Calculation (min): 60 min             Therapist Signature:  Phylis Javed, MEd, Information systems manager Pathologist/Senior Therapist  [facility]  Pediatric and Adult Rehabilitation [email or web address]  11/25/2013

## 2013-11-25 NOTE — Progress Notes (Addendum)
[facility]  [address], [address]  [place], [place]  [address]  Phone:  [phone]  Fax:  [phone]    PHYSICAL THERAPY DAILY TREATMENT NOTE    PATIENT: Katherine [name] DOB: Jul 13, 1950   MR #: [id number]  AGE: 63 y.o.    REFERRING MD:    PRIMARY MD: Veneda Melter, MD    CERTIFICATION DATES:     09-30-13 to 12-31-13 DIAGNOSES: CVA (cerebral infarction) [434.91]          Date of Service PT Received On: 11/25/13   Treatment Time Start Time: 1300 to Stop Time: 1400   Time Calculation Time Calculation (min): 60 min   Visit #  5   Units Billed   Therapeutic Interventions  $ PT Therapeutic Exercise (97110): 23-37 mins  $ PT Therapeutic Activity (97530): 2 units       Precautions/Contraindications: L hemiplegia    Medications:  has a current medication list which includes the following prescription(s): atorvastatin, baclofen, insulin aspart, insulin glargine, insulin syringe 1cc/29g, pramipexole, and rivaroxaban.  Patient/Caregiver does not report changes to medication at this time.    SUBJECTIVE:     Pain report:  Severe pain L shoulder interfering with sleep     OBJECTIVE:  Objective Findings today:     Treatment Performed:  THERAPEUTIC EXERCISES  Therapeutic Exercises per Flow Sheet.    TA: step over cones, tap cones, lateral step over cones, lat step /tap cones    Education/Home Exercise Program:    Patient was educated on new strengthening ex today    Pt. did demonstrate an understanding of this education.    []   No updates to home program today  []   HEP updated:    ASSESSMENT:   Katherine [name] presents today with Decreased PROM  Decreased AROM  Decreased Strength   Impaired Balance  Impaired Proprioception  Impaired Gait.  Patient is improving with BIG walking. Had some difficulty w/ cone taps.  Liked new hip strengthening ex done today.  Patient is progressing well toward goals listed below:    Goals (copied from last note):  1. Pt will be I  comprehensive HEP of balance/strength/flexibility ex and carry over on daily basis.  2. Pt will be assessed on neurocom for SOT, adaptability and motor control.(met)  3. Pt will be able to ambulate at normal gait speed with no deviations for community ambulation-as evidenced by the ability to ambulate on TM at 2.0 MPH and will be able to ambulate on inclined TM up to 10% grade w/ full L foot clearance.  4. Pt will improve balance as demo'd by minibest score increasing to 25/28.  5. Pt will easily be able to get down/up from floor to allow her to play and interact with grandchildren, improve her quality of life.(met)   STG Progress Toward Goals   1 Progressing   2 Met    3 Progressing   4 Progressing   5 Met        PLAN:   Patient requires continued skilled intervention in order to increase patient safety and independence with daily activities. and meet above mentioned functional goals.  Focus on inc speed on TM next visit.    Therapist Signature:  Samul Dada PT [phone]  [facility]  Adult and Pediatric Rehab  [address]  [place], [place] [address]  [phone]

## 2013-12-02 ENCOUNTER — Ambulatory Visit: Payer: No Typology Code available for payment source

## 2013-12-02 DIAGNOSIS — I69919 Unspecified symptoms and signs involving cognitive functions following unspecified cerebrovascular disease: Secondary | ICD-10-CM | POA: Insufficient documentation

## 2013-12-02 DIAGNOSIS — I69998 Other sequelae following unspecified cerebrovascular disease: Secondary | ICD-10-CM | POA: Insufficient documentation

## 2013-12-02 DIAGNOSIS — R41844 Frontal lobe and executive function deficit: Secondary | ICD-10-CM

## 2013-12-02 DIAGNOSIS — R29898 Other symptoms and signs involving the musculoskeletal system: Secondary | ICD-10-CM | POA: Insufficient documentation

## 2013-12-02 DIAGNOSIS — R269 Unspecified abnormalities of gait and mobility: Secondary | ICD-10-CM

## 2013-12-02 NOTE — Progress Notes (Signed)
[facility]  [address], [address]  [place], [place]  [address]  Phone:  [phone]  Fax:  [phone]    PHYSICAL THERAPY DAILY TREATMENT NOTE    PATIENT: Katherine [name] DOB: [birth year]   MR #: [id number]  AGE: 63 y.o.    REFERRING MD:    PRIMARY MD: Veneda Melter, MD    CERTIFICATION DATES:     09-30-13 to 0-2-15 DIAGNOSES: CVA (cerebral infarction) [434.91]          Date of Service PT Received On: 12/02/13   Treatment Time Start Time: 1305 to Stop Time: 1400   Time Calculation Time Calculation (min): 55 min   Visit #  6   Units Billed   Therapeutic Interventions  $ PT Therapeutic Exercise (97110): 38-52 mins  $ PT Therapeutic Activity (97530): 1 unit       Precautions/Contraindications:  Hemiplegia: left    Medications:  has a current medication list which includes the following prescription(s): atorvastatin, baclofen, insulin aspart, insulin glargine, insulin syringe 1cc/29g, pramipexole, and rivaroxaban.  Patient/Caregiver does report changes to medication at this time.  Inc baclofen a week and a half ago at night for UE tone/spasms.  Now able to wear saebo brace at night w/o fingers popping out.    SUBJECTIVE:   Pt reports working on TM at home up to 1.5 mph for 10 min.  Pain report:  none    OBJECTIVE:    Treatment Performed:  THERAPEUTIC EXERCISES  Therapeutic Exercises per Flow Sheet.  Pt interested in doing exercises with therapy ball to add more core stability.  She plans on purchasing a ball for home use.  See flow sheet/written program with new ball exercises.  TA: Practiced floor transfers and floor mobility how to move from sit>quadruped w/o holding onto rail.  Minibest: 23/28-improved from eval 20/28    Education/Home Exercise Program:    Patient was educated on easier transition from  Long sit>quadruped.   Pt. did demonstrate an understanding of this education.    []   No updates to home program today  [x]   HEP updated:ball  exercises-see handout  ASSESSMENT:   Bette A Cloninger presents today with improved tone in LUE from increase in meds.  She is able to obtain quadruped over ball for gentle WB through L hand/wrist with towel roll for comfort  Patient is progressing well toward goals listed below:    Goals (copied from last note):  1. Pt will be I comprehensive HEP of balance/strength/flexibility ex and carry over on daily basis.  2. Pt will be assessed on neurocom for SOT, adaptability and motor control.(met)  3. Pt will be able to ambulate at normal gait speed with no deviations for community ambulation-as evidenced by the ability to ambulate on TM at 2.0 MPH and will be able to ambulate on inclined TM up to 10% grade w/ full L foot clearance.  4. Pt will improve balance as demo'd by minibest score increasing to 25/28.  5. Pt will easily be able to get down/up from floor to allow her to play and interact with grandchildren, improve her quality of life.(met)     STG Progress Toward Goals   1 Progressing   2 Met    3 Progressing   4 Progressing   5 Met        PLAN:   Patient requires continued skilled intervention in order to increase patient safety and independence with daily  activities. and meet above mentioned functional goals.  Focus on inc speed/incline on TM next visit.    Therapist Signature:  Samul Dada PT [phone]  [facility]  Adult and Pediatric Rehab  [address]  [place], [place] [address]  [phone]

## 2013-12-09 ENCOUNTER — Ambulatory Visit: Payer: No Typology Code available for payment source

## 2013-12-16 ENCOUNTER — Ambulatory Visit: Payer: No Typology Code available for payment source

## 2013-12-16 DIAGNOSIS — R41844 Frontal lobe and executive function deficit: Secondary | ICD-10-CM

## 2013-12-16 DIAGNOSIS — R269 Unspecified abnormalities of gait and mobility: Secondary | ICD-10-CM

## 2013-12-16 NOTE — Progress Notes (Signed)
[facility]  [address], [address]  [place], [place]  [address]  Phone:  [phone]  Fax:  [phone]    PHYSICAL THERAPY DAILY TREATMENT NOTE    PATIENT: Katherine [name] DOB: 16-Jan-1951   MR #: [id number]  AGE: 63 y.o.    REFERRING MD:    PRIMARY MD: Veneda Melter, MD    CERTIFICATION DATES:     09-30-13 to 12-31-13  DIAGNOSES: CVA (cerebral infarction) [434.91]          Date of Service PT Received On: 12/16/13   Treatment Time Start Time: 1300 to Stop Time: 1400   Time Calculation Time Calculation (min): 60 min   Visit #  7   Units Billed   Therapeutic Interventions  $ PT Therapeutic Exercise (97110): 23-37 mins  $ PT Therapeutic Activity (97530): 2 units       Precautions/Contraindications:  Hemiplegia: left    Medications:  has a current medication list which includes the following prescription(s): atorvastatin, baclofen, insulin aspart, insulin glargine, insulin syringe 1cc/29g, pramipexole, and rivaroxaban.  Patient/Caregiver does report changes to medication at this time.  new antibiotic for 1 week d/t sinus infection-to see MD tomorrow -may need new prescription.    SUBJECTIVE:   Pt reports still fatigued, with bad cough not feeling 100% today.  Report to have driving assessment next week.  Pain report:  None at rest but L shoulder pain to 8/10 w/ movement.  OBJECTIVE:    Treatment Performed:  THERAPEUTIC EXERCISES  Therapeutic Exercises per Flow Sheet.    THERAPEUTIC ACTIVITIES  Transfer Training:floor<>stand, quadruped<>supine    Education/Home Exercise Program:    Patient was educated on more normal method of transtioning from long st<>quadruped.    Pt. did demonstrate an understanding of this education.    []   No updates to home program today  []   HEP updated:    ASSESSMENT:   Kenyetta A Rossi presents today with Decreased PROM  Decreased AROM  Decreased Strength   Pain  Impaired Balance  Impaired Gait.  Patient is improving with  understanding bal exercises. Note inc gait deviations today due to pt not feeling well.  Patient is slowly progressing well toward goals listed below:    Goals (copied from last note):  1. Pt will be I comprehensive HEP of balance/strength/flexibility ex and carry over on daily basis.  2. Pt will be assessed on neurocom for SOT, adaptability and motor control.(met)  3. Pt will be able to ambulate at normal gait speed with no deviations for community ambulation-as evidenced by the ability to ambulate on TM at 2.0 MPH and will be able to ambulate on inclined TM up to 10% grade w/ full L foot clearance.  4. Pt will improve balance as demo'd by minibest score increasing to 25/28.  5. Pt will easily be able to get down/up from floor to allow her to play and interact with grandchildren, improve her quality of life.(met)       STG Progress Toward Goals   1 Met    2 Met    3 Not Addressed Today   4 Not Addressed Today   5 Met        PLAN:   Patient requires continued skilled intervention in order to increase patient safety and independence with daily activities. and meet above mentioned functional goals.  Focus on HEP next visit.    Therapist Signature:  Samul Dada PT [phone]  Verne Carrow  [facility]  Adult and Pediatric Rehab  [address]  [place], [place] [address]  [phone]

## 2013-12-16 NOTE — Progress Notes (Signed)
[facility]  [address]  [place], [place]  [address]  Phone:  [phone]  Fax:  [phone]    Speech Therapy Daily Note    PATIENT: Katherine [name]    Referred By: Veneda Melter, MD  DOB:   [birth year]                                 Medical Record #: [id number]  AGE:   63 y.o.                          Primary Diagnosis: CVA (cerebral infarction) [434.91]  Facility Provider #: [phone]   HICN# Patient is not covered by Medicare.    Treatment Diagnosis:  Executive Function/Memory Deficits 799.55    Visit #: SLP Visit  SLP Visit Number: 19  Visit Limit:  30 visits      Certification Period: 05/26/13 to 11/23/13     Time of treatment:   Time Calculation  SLP Received On: 12/16/2013  Start Time: 1400  Stop Time: 1500  Time Calculation (min): 60 min    Medications:  has a current medication list which includes the following prescription(s): atorvastatin, insulin aspart, insulin glargine, insulin syringe 1cc/29g, pramipexole, and rivaroxaban, baclofen    Patient/parent does report changes to medication at this time. (Baclofen)    Patient Goal:  Patient Goal: To improve cognition, return to work and use a computer for return to work.    Therapy Goals:  (Copied from Evaluation)  Goal status updated today.    Short Term Goals:   To be completed by 08/23/13:  1.  Pt will complete Test of Memory and Learning x 100%. *Mastered  2.  Pt will recall memory strategies x 100% I.  *Mastered  3.  Pt will demonstrate use of memory strategies to organize a list of 20 words for recall x 80% with min cues.  *Mastered  4.  Added 07/07/13: Pt will complete convergent naming activities with concrete and abstract items x 90% I.  5.  Added 07/07/13: Pt will complete executive function tasks of reasoning, decision making and self-correction x 90% I using requirements per Neuro[employer] Online.     Long Term Goals:  To be completed by 11/23/13:  1.  Pt will answer questions from  visual stimulus of 3-5 minutes x 80% with min cues. *Mastered  2.  Pt will recall a list of 20 words x 90% with self-cues. *Mastered  3.  Pt will demonstrate immediate recall of location of 4-8 objects x 90% with self-cues.  4.  Added 08/12/2013:  Pt will demonstrate ability to complete high-level cognitive tasks, such as deductive reasoning tasks, x 90% with min verbal cues and with auditory distraction.    Subjective:   Pain report:  []   [name] reported today.  [x]   Pain level  5/10 today.  Location:  Arm     Subjective patient report:  Patient has made some progress in computer programs.  She reported that she would like to resume driving and has had clearance by MD for driving assessment.    Objective:  Objective Findings today:   Pt continues to have difficulty in NPO programs.  Levels remain.   Pt completed initial driving simulator programs.  Pt presented with executive function and decision-making deficits which blocked her from deciding turns  and how to avoid dangers presented on simulator.  Interventions Performed:   []  SLP provided pt with tactile cues:   [x]  SLP provided pt with visual cues - mod  [x]  SLP provided pt with verbal cues - mod  []  SLP provided pt with phonemic cues   []  Handouts on:   [x]  NeuroPsych Online: computer software focused on predetermined therapy protocol in which the patient is systematically presented with the tracks listed below in hierarchy contingent on completing prior to progression to higher level tasks.   []  Attention Skills   [x]  Executive Functioning Skills   []  Memory Skills   []  Visualspatial Skills   [x]  Problem Solving Skills   [x]  Communication Skills  []    Photo Cue Cards: Everyday activities.  SLP had pt view each card. for 1 minute then answer 1-2 questions on each card.  [x]  Other: Driving simulator equipment    Education/Home Exercise Program:    Patient was educated on concepts for NPO programs and where to find errors to fix.  Pt. demonstrated an  understanding of this education.    []   No updates to home program today    [x]   HEP updated:  Updated NPO program     Assessment:   Pt presented with moderate to severe deficits in problem solving for driving simulation.       Patient presents with mild to moderate cognitive changes characterized by memory loss, difficulty with slow auditory comprehension and nonverbal memory tasks, difficulty with judgment and writing. Patient requires skilled therapy intervention to address Assessment: Impaired Memory, as well as the above mentioned deficits to maximize safety with daily activities and return to prior level of function.  Progress Toward Functional Goals: all LTGs     Patient Requires Continued Skilled Care to focus on visual memory in order to increased patient safety and independence.    Plan: Pt will have a break in service due to insurance limitations.      Treatment Code # of Minutes   SLP Swallow Treatment [phone])    SLP Treatment Rudean Hitt [phone]) Time Calculation (min): 60 min             Therapist Signature:   Ramel Tobon, MEd, Information systems manager Pathologist/Senior Therapist  [facility]  Pediatric and Adult Rehabilitation [email or web address]  12/16/2013

## 2013-12-22 ENCOUNTER — Ambulatory Visit: Payer: Self-pay | Attending: Neurology

## 2013-12-22 DIAGNOSIS — R269 Unspecified abnormalities of gait and mobility: Secondary | ICD-10-CM | POA: Insufficient documentation

## 2013-12-22 NOTE — Progress Notes (Signed)
[facility]  [address]  [place], [place]  [address]  Phone:  [phone]  Fax:  [phone]    Speech Therapy Daily Note    PATIENT: Katherine [name]    Referred By: Veneda Melter, MD  DOB:   1950/10/09                                 Medical Record #: [id number]  AGE:   63 y.o.                          Primary Diagnosis: CVA (cerebral infarction) [434.91]  Facility Provider #: [phone]   HICN# Patient is not covered by Medicare.    Treatment Diagnosis:  Executive Function/Memory Deficits 799.55    Visit #: SLP Visit  SLP Visit Number: 18  Visit Limit:  30 visits      Certification Period: 05/26/13 to 11/23/13     Time of treatment:   Time Calculation  SLP Received On: 12/02/2013  Start Time: 1400  Stop Time: 1500  Time Calculation (min): 60 min    Medications:  has a current medication list which includes the following prescription(s): atorvastatin, insulin aspart, insulin glargine, insulin syringe 1cc/29g, pramipexole, and rivaroxaban, baclofen    Patient/parent does not report changes to medication at this time.     Patient Goal:  Patient Goal: To improve cognition, return to work and use a computer for return to work.    Therapy Goals:  (Copied from Evaluation)  Goal status updated today.    Short Term Goals:   To be completed by 08/23/13:  1.  Pt will complete Test of Memory and Learning x 100%. *Mastered  2.  Pt will recall memory strategies x 100% I.  *Mastered  3.  Pt will demonstrate use of memory strategies to organize a list of 20 words for recall x 80% with min cues.  *Mastered  4.  Added 07/07/13: Pt will complete convergent naming activities with concrete and abstract items x 90% I.  5.  Added 07/07/13: Pt will complete executive function tasks of reasoning, decision making and self-correction x 90% I using requirements per Neuro[employer] Online.     Long Term Goals:  To be completed by 11/23/13:  1.  Pt will answer questions from visual  stimulus of 3-5 minutes x 80% with min cues. *Mastered  2.  Pt will recall a list of 20 words x 90% with self-cues. *Mastered  3.  Pt will demonstrate immediate recall of location of 4-8 objects x 90% with self-cues.  4.  Added 08/12/2013:  Pt will demonstrate ability to complete high-level cognitive tasks, such as deductive reasoning tasks, x 90% with min verbal cues and with auditory distraction.    Subjective:   Pain report:  [x]   [name] reported today.  []   Pain level  5/10 today.  Location:  Arm     Subjective patient report:  Patient missed last week's tx for vacation.  "I haven't been very good about my homework since my computer crashed."    Objective:  Objective Findings today:   LTG 4, STG 5: NPO program below.  Track 5: Problem Solving Skills  Task 1: Deduce It    *Pt to analyze the written clues provided to deduce the numbers represented (1-3 digits, increasing in complexity by level.    **  Requirements: 100% accuracy at all levels out of 5 presentations per task.    Level: 3  # correct: 3  Pass: No    Track 6: Communication Skills  Task 7: Follow My Instruction (written)  Task 8: Follow My Instruction (vocal)    *Written/verbal instruction are presented ranging from one part in Level 1 to multistep with qualifiers (i.e. "Before this" and "after that") for Level 4.  For Level 4, one must recall the instructions as they are erased from the screen during the response phase.    **Requirements: 100% accuracy at all levels    Written/Verbal    Level: 1, 1 and 2  # errors: 0, 0, 1  Pass: Yes, yes, no    Track 2: Barista  Task 2: Attributes and Groups    *One must be able to accurately analyze and compare attributes of the objects presented.  Based upon the analysis the objects must be placed in a grid so that rows, columns and corners have like attributes.  In level 4, pt must identify common attribute based on presentations.    Requirements for pass:  100% accuracy    Today's results:    Level:3  #  errors: 1  Pass: No    Interventions Performed:   []  SLP provided pt with tactile cues:   [x]  SLP provided pt with visual cues - none  [x]  SLP provided pt with verbal cues - min  []  SLP provided pt with phonemic cues   []  Handouts on:   [x]  NeuroPsych Online: computer software focused on predetermined therapy protocol in which the patient is systematically presented with the tracks listed below in hierarchy contingent on completing prior to progression to higher level tasks.   []  Attention Skills   [x]  Executive Functioning Skills   []  Memory Skills   []  Visualspatial Skills   [x]  Problem Solving Skills   [x]  Communication Skills  []    Photo Cue Cards: Everyday activities.  SLP had pt view each card. for 1 minute then answer 1-2 questions on each card.  []  Other: Mastermind used for visual deductive reasoning.  Visual and auditory distractions provided.    Education/Home Exercise Program:    Patient was educated on concepts for NPO programs and where to find errors to fix.  Pt. demonstrated an understanding of this education.    []   No updates to home program today    [x]   HEP updated:  Updated NPO program     Assessment:   Pt presented with marked improvements in tasks today, although has not passed in HEP without SLP assistance.       Patient presents with mild to moderate cognitive changes characterized by memory loss, difficulty with slow auditory comprehension and nonverbal memory tasks, difficulty with judgment and writing. Patient requires skilled therapy intervention to address Assessment: Impaired Memory, as well as the above mentioned deficits to maximize safety with daily activities and return to prior level of function.  Progress Toward Functional Goals: all LTGs     Patient Requires Continued Skilled Care to focus on visual memory in order to increased patient safety and independence.    Plan: Continue with established plan of care with focus on executive function.      Treatment Code # of Minutes   SLP  Swallow Treatment [phone])    SLP Treatment Indiv [phone]) Time Calculation (min): 60 min             Therapist Signature:  Aqeel Norgaard, MEd, Information systems manager Pathologist/Senior Therapist  [facility]  Pediatric and Adult Rehabilitation [email or web address]  12/02/13

## 2013-12-23 ENCOUNTER — Ambulatory Visit: Payer: No Typology Code available for payment source

## 2013-12-23 DIAGNOSIS — R269 Unspecified abnormalities of gait and mobility: Secondary | ICD-10-CM

## 2013-12-23 NOTE — Progress Notes (Signed)
[facility]  [address]  [place], [place]  [address]  Phone:  [phone]  Fax:  [phone]    PHYSICAL THERAPY DAILY TREATMENT NOTE    PATIENT: TEESHA OHM DOB: 11-17-1950   MR #: [id number]  AGE: 63 y.o.    REFERRING MD:    PRIMARY MD: Veneda Melter, MD    CERTIFICATION DATES:     09-30-13 to 12-31-13 DIAGNOSES: CVA (cerebral infarction) [434.91]          Date of Service PT Received On: 12/23/13   Treatment Time Start Time: 1300 to Stop Time: 1400   Time Calculation Time Calculation (min): 60 min   Visit #  8   Units Billed   Therapeutic Interventions  $ PT Therapeutic Exercise (97110): 23-37 mins  $ PT Therapeutic Activity (97530): 2 units       Precautions/Contraindications:  Fall Risk and Hemiplegia: left    Medications:  has a current medication list which includes the following prescription(s): atorvastatin, baclofen, insulin aspart, insulin glargine, insulin syringe 1cc/29g, pramipexole, and rivaroxaban.  Patient/Caregiver does not report changes to medication at this time. Still taking same antibiotic but reports still hs infection.    SUBJECTIVE:   Pt reports still not feeling well from sinus infection. Some dizziness at night when gets up to use bathroom-uses cane and night light-had ear fall into wall one night.  Passed driver test yesterday but they found a mild L visual field cut-pt asking for exercises to do to work on this.  Goes back on the 8th for road test.  Pain report:  6 on a scale of 1 - 10 at rest L shoulder.  ABC: 96%  OBJECTIVE:  Objective Findings today:    Special Tests: SOT- pt scored WNL-much improved from initial test 10-14-13  Minibesttest scored:23/28    Treatment Performed:  THERAPEUTIC EXERCISES  Therapeutic Exercises per Flow Sheet.    TA: high level balance ex: rhomber/tandem stance with EC, head turns, etc  Education/Home Exercise Program:    Patient was educated on smooth pursuit ex for L visual  field, pt given comprehensive HEP w/ gait/balance activities, strengthening for LLE and core.      Pt. did demonstrate an understanding of this education.    []   No updates to home program today  []   HEP updated:    ASSESSMENT:   Laurelyn A Gittens presents today with improved quality of gait with occasional VC's.  Pt not feeling well last 2 sessions d/t sinus infection so unable to work on increasing speed or inclination.  Pt still with mild weakness L LE compared to RLE andhas a HEP to address these issues.  Pts balance improved since eval-her minibest score increased from 20 to 23/28 and her SOT scores on the neurocom improved to normal levels decreasing her fall risk.   Pt has a good understanding of HEP and is self motivated to continue with on her own.  Patient is progressing well toward goals listed below:    Goals (copied from last note):  1. Pt will be I comprehensive HEP of balance/strength/flexibility ex and carry over on daily basis.  2. Pt will be assessed on neurocom for SOT, adaptability and motor control.(met)  3. Pt will be able to ambulate at normal gait speed with no deviations for community ambulation-as evidenced by the ability to ambulate on TM at 2.0 MPH and will be able to ambulate on inclined TM  up to 10% grade w/ full L foot clearance.  4. Pt will improve balance as demo'd by minibest score increasing to 25/28.  5. Pt will easily be able to get down/up from floor to allow her to play and interact with grandchildren, improve her quality of life.(met)     STG Progress Toward Goals   1 Met    2 Met    3 unable to assess at time of [place]   4 Progressing   5 Met        PLAN:   Patient requires continued skilled intervention in order to [place] PT-pt to continue with HEP on her own.      Therapist Signature:  Samul Dada PT [phone]  [facility]  Adult and Pediatric Rehab  [address]  [place], [place] [address]  [phone]

## 2013-12-30 ENCOUNTER — Ambulatory Visit: Payer: No Typology Code available for payment source

## 2013-12-30 ENCOUNTER — Ambulatory Visit: Payer: Self-pay | Attending: Neurology

## 2013-12-30 DIAGNOSIS — R269 Unspecified abnormalities of gait and mobility: Secondary | ICD-10-CM | POA: Insufficient documentation

## 2014-01-03 ENCOUNTER — Other Ambulatory Visit: Payer: Self-pay | Admitting: Cardiovascular Disease

## 2014-01-03 DIAGNOSIS — I639 Cerebral infarction, unspecified: Secondary | ICD-10-CM

## 2014-01-06 ENCOUNTER — Ambulatory Visit
Admission: RE | Admit: 2014-01-06 | Discharge: 2014-01-06 | Disposition: A | Discharge status: Home / Self Care | Payer: No Typology Code available for payment source | Source: Ambulatory Visit | Attending: Cardiovascular Disease | Admitting: Cardiovascular Disease

## 2014-01-06 ENCOUNTER — Ambulatory Visit: Payer: No Typology Code available for payment source

## 2014-01-06 DIAGNOSIS — I5189 Other ill-defined heart diseases: Secondary | ICD-10-CM | POA: Insufficient documentation

## 2014-01-06 DIAGNOSIS — R002 Palpitations: Secondary | ICD-10-CM | POA: Insufficient documentation

## 2014-01-06 DIAGNOSIS — I517 Cardiomegaly: Secondary | ICD-10-CM | POA: Insufficient documentation

## 2014-01-06 DIAGNOSIS — I639 Cerebral infarction, unspecified: Secondary | ICD-10-CM | POA: Insufficient documentation

## 2014-01-13 ENCOUNTER — Ambulatory Visit: Payer: No Typology Code available for payment source

## 2014-01-20 ENCOUNTER — Ambulatory Visit: Payer: No Typology Code available for payment source

## 2014-01-27 ENCOUNTER — Ambulatory Visit: Payer: No Typology Code available for payment source

## 2014-01-30 ENCOUNTER — Ambulatory Visit: Payer: Self-pay | Attending: Neurology

## 2014-01-30 DIAGNOSIS — R269 Unspecified abnormalities of gait and mobility: Secondary | ICD-10-CM | POA: Insufficient documentation

## 2014-03-01 ENCOUNTER — Ambulatory Visit: Payer: Self-pay | Attending: Neurology

## 2014-03-01 DIAGNOSIS — R269 Unspecified abnormalities of gait and mobility: Secondary | ICD-10-CM | POA: Insufficient documentation

## 2014-03-03 HISTORY — PX: CATARACT EXTRACTION: SUR2

## 2014-03-17 ENCOUNTER — Observation Stay: Payer: No Typology Code available for payment source | Admitting: Internal Medicine

## 2014-03-17 ENCOUNTER — Observation Stay
Admission: EM | Admit: 2014-03-17 | Discharge: 2014-03-18 | Disposition: A | Discharge status: Home / Self Care | Payer: No Typology Code available for payment source | Attending: Internal Medicine | Admitting: Internal Medicine

## 2014-03-17 ENCOUNTER — Emergency Department: Payer: No Typology Code available for payment source

## 2014-03-17 DIAGNOSIS — Z8669 Personal history of other diseases of the nervous system and sense organs: Secondary | ICD-10-CM | POA: Insufficient documentation

## 2014-03-17 DIAGNOSIS — I669 Occlusion and stenosis of unspecified cerebral artery: Secondary | ICD-10-CM | POA: Insufficient documentation

## 2014-03-17 DIAGNOSIS — Z23 Encounter for immunization: Secondary | ICD-10-CM | POA: Insufficient documentation

## 2014-03-17 DIAGNOSIS — Z823 Family history of stroke: Secondary | ICD-10-CM | POA: Insufficient documentation

## 2014-03-17 DIAGNOSIS — Q2112 Patent foramen ovale: Secondary | ICD-10-CM

## 2014-03-17 DIAGNOSIS — N179 Acute kidney failure, unspecified: Secondary | ICD-10-CM | POA: Insufficient documentation

## 2014-03-17 DIAGNOSIS — E118 Type 2 diabetes mellitus with unspecified complications: Secondary | ICD-10-CM | POA: Insufficient documentation

## 2014-03-17 DIAGNOSIS — Z9181 History of falling: Secondary | ICD-10-CM | POA: Insufficient documentation

## 2014-03-17 DIAGNOSIS — E119 Type 2 diabetes mellitus without complications: Secondary | ICD-10-CM | POA: Diagnosis present

## 2014-03-17 DIAGNOSIS — Q211 Atrial septal defect: Secondary | ICD-10-CM | POA: Insufficient documentation

## 2014-03-17 DIAGNOSIS — G458 Other transient cerebral ischemic attacks and related syndromes: Secondary | ICD-10-CM

## 2014-03-17 DIAGNOSIS — G9389 Other specified disorders of brain: Secondary | ICD-10-CM | POA: Insufficient documentation

## 2014-03-17 DIAGNOSIS — Q283 Other malformations of cerebral vessels: Secondary | ICD-10-CM | POA: Insufficient documentation

## 2014-03-17 DIAGNOSIS — I6523 Occlusion and stenosis of bilateral carotid arteries: Secondary | ICD-10-CM | POA: Insufficient documentation

## 2014-03-17 DIAGNOSIS — E785 Hyperlipidemia, unspecified: Secondary | ICD-10-CM | POA: Insufficient documentation

## 2014-03-17 DIAGNOSIS — R42 Dizziness and giddiness: Principal | ICD-10-CM | POA: Insufficient documentation

## 2014-03-17 DIAGNOSIS — I69354 Hemiplegia and hemiparesis following cerebral infarction affecting left non-dominant side: Secondary | ICD-10-CM | POA: Insufficient documentation

## 2014-03-17 DIAGNOSIS — E1165 Type 2 diabetes mellitus with hyperglycemia: Secondary | ICD-10-CM | POA: Insufficient documentation

## 2014-03-17 DIAGNOSIS — Z7901 Long term (current) use of anticoagulants: Secondary | ICD-10-CM | POA: Insufficient documentation

## 2014-03-17 DIAGNOSIS — Z833 Family history of diabetes mellitus: Secondary | ICD-10-CM | POA: Insufficient documentation

## 2014-03-17 DIAGNOSIS — R112 Nausea with vomiting, unspecified: Secondary | ICD-10-CM | POA: Insufficient documentation

## 2014-03-17 DIAGNOSIS — G459 Transient cerebral ischemic attack, unspecified: Secondary | ICD-10-CM | POA: Diagnosis present

## 2014-03-17 DIAGNOSIS — Z794 Long term (current) use of insulin: Secondary | ICD-10-CM | POA: Insufficient documentation

## 2014-03-17 LAB — CBC AND DIFFERENTIAL
Basophils Absolute Automated: 0.01 10*3/uL (ref 0.00–0.20)
Basophils Automated: 0 %
Eosinophils Absolute Automated: 0 10*3/uL (ref 0.00–0.70)
Eosinophils Automated: 0 %
Hematocrit: 40.2 % (ref 37.0–47.0)
Hgb: 13.5 g/dL (ref 12.0–16.0)
Immature Granulocytes Absolute: 0.01 10*3/uL
Immature Granulocytes: 0 %
Lymphocytes Absolute Automated: 0.49 10*3/uL — ABNORMAL LOW (ref 0.50–4.40)
Lymphocytes Automated: 4 %
MCH: 29 pg (ref 28.0–32.0)
MCHC: 33.6 g/dL (ref 32.0–36.0)
MCV: 86.5 fL (ref 80.0–100.0)
MPV: 11 fL (ref 9.4–12.3)
Monocytes Absolute Automated: 0.57 10*3/uL (ref 0.00–1.20)
Monocytes: 5 %
Neutrophils Absolute: 9.84 10*3/uL — ABNORMAL HIGH (ref 1.80–8.10)
Neutrophils: 90 %
Platelets: 208 10*3/uL (ref 140–400)
RBC: 4.65 10*6/uL (ref 4.20–5.40)
RDW: 13 % (ref 12–15)
WBC: 10.91 10*3/uL — ABNORMAL HIGH (ref 3.50–10.80)

## 2014-03-17 LAB — ECG 12-LEAD
Atrial Rate: 77 {beats}/min
P Axis: 48 degrees
P-R Interval: 156 ms
Q-T Interval: 396 ms
QRS Duration: 82 ms
QTC Calculation (Bezet): 448 ms
R Axis: 47 degrees
T Axis: 40 degrees
Ventricular Rate: 77 {beats}/min

## 2014-03-17 LAB — GLUCOSE WHOLE BLOOD - POCT
Whole Blood Glucose POCT: 420 mg/dL — ABNORMAL HIGH (ref 70–100)
Whole Blood Glucose POCT: 357 mg/dL — ABNORMAL HIGH (ref 70–100)
Whole Blood Glucose POCT: 405 mg/dL — ABNORMAL HIGH (ref 70–100)

## 2014-03-17 LAB — COMPREHENSIVE METABOLIC PANEL
ALT: 12 U/L (ref 0–55)
AST (SGOT): 20 U/L (ref 5–34)
Albumin/Globulin Ratio: 1.3 (ref 0.9–2.2)
Albumin: 4.2 g/dL (ref 3.5–5.0)
Alkaline Phosphatase: 134 U/L — ABNORMAL HIGH (ref 37–106)
Anion Gap: 15 (ref 5.0–15.0)
BUN: 28.9 mg/dL — ABNORMAL HIGH (ref 7.0–19.0)
Bilirubin, Total: 0.6 mg/dL (ref 0.2–1.2)
CO2: 22 mEq/L (ref 22–29)
Calcium: 9.9 mg/dL (ref 8.5–10.5)
Chloride: 105 mEq/L (ref 100–111)
Creatinine: 1.2 mg/dL — ABNORMAL HIGH (ref 0.6–1.0)
Globulin: 3.3 g/dL (ref 2.0–3.6)
Glucose: 448 mg/dL — ABNORMAL HIGH (ref 70–100)
Potassium: 5.5 mEq/L — ABNORMAL HIGH (ref 3.5–5.1)
Protein, Total: 7.5 g/dL (ref 6.0–8.3)
Sodium: 142 mEq/L (ref 136–145)

## 2014-03-17 LAB — PT/INR
PT INR: 1.2
PT: 14.8 s (ref 12.6–15.0)

## 2014-03-17 LAB — CREATINE KINASE W/O REFLEX (SOFT): Creatine Kinase (CK): 164 U/L (ref 29–168)

## 2014-03-17 LAB — APTT: PTT: 25 s (ref 23–37)

## 2014-03-17 LAB — CELL MORPHOLOGY
Cell Morphology: NORMAL
Platelet Estimate: NORMAL

## 2014-03-17 LAB — GFR: EGFR: 45.3

## 2014-03-17 LAB — TROPONIN I: Troponin I: 0.02 ng/mL (ref 0.00–0.09)

## 2014-03-17 MED ORDER — CIPROFLOXACIN HCL 0.3 % OP SOLN
1.0000 [drp] | Freq: Four times a day (QID) | OPHTHALMIC | Status: DC
Start: 2014-03-17 — End: 2014-03-18
  Administered 2014-03-17 – 2014-03-18 (×4): 1 [drp] via OPHTHALMIC
  Filled 2014-03-17: qty 5

## 2014-03-17 MED ORDER — ONDANSETRON HCL 4 MG/2ML IJ SOLN
4.00 mg | Freq: Three times a day (TID) | INTRAMUSCULAR | Status: DC | PRN
Start: 2014-03-17 — End: 2014-03-18

## 2014-03-17 MED ORDER — ATORVASTATIN CALCIUM 20 MG PO TABS
80.0000 mg | ORAL_TABLET | Freq: Every day | ORAL | Status: DC
Start: 2014-03-17 — End: 2014-03-18
  Administered 2014-03-17: 80 mg via ORAL
  Filled 2014-03-17: qty 4

## 2014-03-17 MED ORDER — BACLOFEN 10 MG PO TABS
10.00 mg | ORAL_TABLET | Freq: Three times a day (TID) | ORAL | Status: DC
Start: 2014-03-17 — End: 2014-03-18
  Administered 2014-03-17 – 2014-03-18 (×4): 10 mg via ORAL
  Filled 2014-03-17 (×4): qty 1

## 2014-03-17 MED ORDER — ASPIRIN 300 MG RE SUPP
300.00 mg | Freq: Once | RECTAL | Status: AC
Start: 2014-03-17 — End: 2014-03-17
  Administered 2014-03-17: 300 mg via RECTAL
  Filled 2014-03-17: qty 1

## 2014-03-17 MED ORDER — RIVAROXABAN 20 MG PO TABS
20.00 mg | ORAL_TABLET | Freq: Every day | ORAL | Status: DC
Start: 2014-03-17 — End: 2014-03-18
  Administered 2014-03-17: 20 mg via ORAL
  Filled 2014-03-17: qty 1

## 2014-03-17 MED ORDER — INSULIN ASPART 100 UNIT/ML SC SOLN
1.00 [IU] | Freq: Every evening | SUBCUTANEOUS | Status: DC | PRN
Start: 2014-03-17 — End: 2014-03-18
  Administered 2014-03-17: 3 [IU] via SUBCUTANEOUS
  Filled 2014-03-17: qty 3

## 2014-03-17 MED ORDER — PROMETHAZINE HCL 25 MG/ML IJ SOLN
12.50 mg | Freq: Once | INTRAMUSCULAR | Status: AC
Start: 2014-03-17 — End: 2014-03-17
  Administered 2014-03-17: 12.5 mg via INTRAVENOUS
  Filled 2014-03-17: qty 1

## 2014-03-17 MED ORDER — INSULIN GLARGINE 100 UNIT/ML SC SOLN
25.00 [IU] | Freq: Two times a day (BID) | SUBCUTANEOUS | Status: DC
Start: 2014-03-17 — End: 2014-03-18
  Administered 2014-03-17 – 2014-03-18 (×2): 25 [IU] via SUBCUTANEOUS
  Filled 2014-03-17 (×2): qty 25

## 2014-03-17 MED ORDER — INSULIN ASPART 100 UNIT/ML SC SOLN
1.00 [IU] | Freq: Three times a day (TID) | SUBCUTANEOUS | Status: DC | PRN
Start: 2014-03-17 — End: 2014-03-18
  Administered 2014-03-18 (×2): 4 [IU] via SUBCUTANEOUS
  Filled 2014-03-17: qty 4

## 2014-03-17 MED ORDER — PRAMIPEXOLE DIHYDROCHLORIDE 0.5 MG PO TABS
0.5000 mg | ORAL_TABLET | Freq: Every evening | ORAL | Status: DC
Start: 2014-03-17 — End: 2014-03-18
  Administered 2014-03-17: 0.5 mg via ORAL
  Filled 2014-03-17 (×4): qty 1

## 2014-03-17 MED ORDER — SODIUM CHLORIDE 0.9 % IV BOLUS
1000.00 mL | Freq: Once | INTRAVENOUS | Status: AC
Start: 2014-03-17 — End: 2014-03-17
  Administered 2014-03-17: 1000 mL via INTRAVENOUS

## 2014-03-17 MED ORDER — INFLUENZA VAC SPLIT QUAD 0.5 ML IM SUSY
0.50 mL | PREFILLED_SYRINGE | Freq: Once | INTRAMUSCULAR | Status: AC
Start: 2014-03-18 — End: 2014-03-18
  Administered 2014-03-18: 0.5 mL via INTRAMUSCULAR
  Filled 2014-03-17: qty 0.5

## 2014-03-17 MED ORDER — GLUCAGON 1 MG IJ SOLR (WRAP)
1.00 mg | INTRAMUSCULAR | Status: DC | PRN
Start: 2014-03-17 — End: 2014-03-18

## 2014-03-17 MED ORDER — INSULIN REGULAR HUMAN 100 UNIT/ML IJ SOLN
5.00 [IU] | Freq: Once | INTRAMUSCULAR | Status: AC
Start: 2014-03-17 — End: 2014-03-17
  Administered 2014-03-17: 5 [IU] via INTRAVENOUS
  Filled 2014-03-17: qty 15

## 2014-03-17 MED ORDER — LABETALOL HCL 5 MG/ML IV SOLN
10.00 mg | INTRAVENOUS | Status: DC | PRN
Start: 2014-03-17 — End: 2014-03-18

## 2014-03-17 MED ORDER — SODIUM CHLORIDE 0.45 % IV SOLN
INTRAVENOUS | Status: DC
Start: 2014-03-17 — End: 2014-03-18
  Administered 2014-03-17: 100 mL/h via INTRAVENOUS

## 2014-03-17 MED ORDER — DEXTROSE 50 % IV SOLN
25.00 mL | INTRAVENOUS | Status: DC | PRN
Start: 2014-03-17 — End: 2014-03-18

## 2014-03-17 MED ORDER — INFLUENZA VAC SPLIT QUAD 0.5 ML IM SUSY
0.50 mL | PREFILLED_SYRINGE | Freq: Once | INTRAMUSCULAR | Status: DC
Start: 2014-03-17 — End: 2014-03-17

## 2014-03-17 NOTE — Progress Notes (Signed)
03/17/14 1833   Provider Notification   Reason for Communication Other (Comment)  (No MRI ordered. Form ready to be faxed. )   Provider Name Dr. Carlyon Shadow   Provider Role Hospitalist   Method of Communication Page

## 2014-03-17 NOTE — Progress Notes (Signed)
Patient arrived on PCU. Transferred patient to bed, released orders, placed patient on telemetry. Dr. Carlyon Shadow bedside.

## 2014-03-17 NOTE — ED Notes (Signed)
BGL: [phone]

## 2014-03-17 NOTE — H&P (Signed)
[facility] HOSPITALIST  H&P    Patient Info:   Date Time: 03/17/2014  6:57 PM   Patient Name:Katherine [name]   ZOX:[id number]    PCP: Veneda Melter, MD   Admit Date:03/17/2014   Attending Physician:Elvina Bosch V,*      Assessment and Plan:       --- Dizziness : Patient has associated nausea and vomiting.  Rule out CVA.  Given her prior history of stroke and also the fact that she is being transitioned to continue anticoagulant.  Possible vertigo also.  We will start her on Xarelto.  Will have cardiology consult Dr. Amado Nash  in a.m. to determine about anticoagulation.  Stroke protocol.  MRI, MRA of the brain.  PT, OT    --- AKI   Continue iv fluids .  Check urine analysis    --- Uncontrolled diabetes  .  Patient did not take her insulin today . continue with Levemir 25 units twice a day and sliding scale     --- Hyperlipidemia, on Lipitor.    --- History of PFO     --- History of CVA with residual left arm weakness . she does manage to walk without any support and does her daily activities .    Patient lives with her daughter        Hospital Problems:  Active Problems:    PFO (patent foramen ovale)    DM (diabetes mellitus)    TIA (transient ischemic attack)    Dizziness    Non-intractable vomiting with nausea    AKI (acute kidney injury)     DVT Prohylaxis:xarelto    Code Status: Full Code   Disposition:home   Condition on admission and Prognosis:stable   Type of Admission:Observation   Estimated Length of Stay (including stay in the ER receiving treatment): less than 2 midnights    Medical Necessity for stay: Dizziness, nausea,  AKI        Clinical Presentation   History of Presenting Illness:   Katherine [name] is a 63 y.o. female who has history of Past Surgical History   Procedure Laterality Date   . Cholecystectomy     . Hysterectomy     . Total abdominal hysterectomy w/ bilateral salpingo-oophorectomy     . Carpal tunnel release       L   . Cataract extraction Left 03/03/2014       Past Medical History   Diagnosis Date   . Cerebrovascular accident    . Migraine    . PFO (patent foramen ovale)    . Diabetes mellitus    . Hyperlipidemia    . Seasonal allergies       came with the chief complaint of dizziness . patient woke up today morning and felt very dizzy and lightheaded . she has been unsteady on her feet and when she tried to walk.  She lost her balance and fell to the ground and hit her head . no loss of consciousness . she then tried to manage to get up and the tried to go upstairs to call her son-in-law, but he was not at home . she was sitting on the stairs and later son-in-law came back home, brought her to the hospital . patient has been having headache after the fall . she was also complaining of nausea, vomiting and had vomited twice . 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  Denies any ringing in  the ears or spinning sensation.  Patient is in the process of changing from Xarelto to  Coumadin as the insurance was not covering the medication . however, her INR has been low despite the Coumadin and the cardiologist was planning to talk to the insurance about approving Xarelto for her .   Review of Systems:  Review of Systems   Constitutional: Negative for fever and chills.   Respiratory: Negative for shortness of breath.    Cardiovascular: Negative for chest pain.   Skin:        Bruising after the fall on the left leg    Neurological: Positive for focal weakness. Negative for tingling, tremors and loss of consciousness.   Endo/Heme/Allergies: Does not bruise/bleed easily.   Psychiatric/Behavioral: Negative for depression.      Vitals:   Vitals reviewed height is 1.702 m (5' 7.01") and weight is 83.008 kg (183 lb). Her temporal artery temperature is 98.1 F (36.7 C). Her blood pressure is 141/65 and her pulse is 85. Her respiration is 16 and oxygen saturation is 99%. Body mass index is 28.66 kg/(m^2).  Filed Vitals:    03/17/14 1520  03/17/14 1553 03/17/14 1557 03/17/14 1645   BP: 150/93 142/54 142/54 141/65   Pulse: 86 81 78 85   Temp:    98.1 F (36.7 C)   TempSrc:    Temporal Artery   Resp: 18 16 16 16    Height:    1.702 m (5' 7.01")   Weight:    83.008 kg (183 lb)   SpO2: 98% 98% 95% 99%     Intake and Output Summary (Last 24 hours) at Date Time No intake or output data in the 24 hours ending 03/17/14 1857   Physical Exam:   Physical Exam   Constitutional: She is oriented to person, place, and time and well-developed, well-nourished, and in no distress.   HENT:   Head: Normocephalic and atraumatic.   Eyes: Pupils are equal, round, and reactive to light.   Cardiovascular: Normal rate and regular rhythm.    Diminished in the left foot, but good on doppler    Pulmonary/Chest: Effort normal and breath sounds normal. She has no wheezes. She has no rales.   Abdominal: Soft. Bowel sounds are normal. There is no tenderness. There is no rebound.   Musculoskeletal: She exhibits no edema.   Neurological: She is alert and oriented to person, place, and time. No cranial nerve deficit.   Left arm, positive for contracture,  with 1 out of 5 strength  Right arm, right leg  5/5  Left  Leg 5/5.  Sensation  Intact.   Skin:   Bruising on the left leg.              Clinical Information   Chief Complaint:  Chief Complaint   Patient presents with   . Dizziness      Past Medical History:  Past Medical History   Diagnosis Date   . Cerebrovascular accident    . Migraine    . PFO (patent foramen ovale)    . Diabetes mellitus    . Hyperlipidemia    . Seasonal allergies       Past Surgical History:  Past Surgical History   Procedure Laterality Date   . Cholecystectomy     . Hysterectomy     . Total abdominal hysterectomy w/ bilateral salpingo-oophorectomy     . Carpal tunnel release       L   . Cataract  extraction Left 03/03/2014      Family History:  Family History   Problem Relation Age of Onset   . Diabetes Mother    . Cancer Mother    . COPD Father    . Cancer  Sister    . Stroke Paternal Grandfather       Social History:  History   Alcohol Use   . Yes     Comment: socially-- maybe once or twice a month     History   Drug Use No     History   Smoking status   . Never Smoker    Smokeless tobacco   . Never Used     History     Social History   . Marital Status: Married     Spouse Name: N/A     Number of Children: N/A   . Years of Education: N/A     Social History Main Topics   . Smoking status: Never Smoker    . Smokeless tobacco: Never Used   . Alcohol Use: Yes      Comment: socially-- maybe once or twice a month   . Drug Use: No   . Sexual Activity: None     Other Topics Concern   . None     Social History Narrative      Allergies:  Allergies   Allergen Reactions   . Penicillins Rash      Medications:  Prescriptions prior to admission   Medication Sig Dispense Refill Last Dose   . atorvastatin (LIPITOR) 80 MG tablet Take 80 mg by mouth daily.   03/16/2014 at hs   . baclofen (LIORESAL) 10 MG tablet Take 10 mg by mouth 3 (three) times daily.   03/17/2014 at am   . ciprofloxacin (CILOXAN) 0.3 % ophthalmic solution Place 1 drop into the left eye 4 (four) times daily.   03/17/2014 at am   . Dextromethorphan-Quinidine 20-10 MG Cap Take 1 capsule by mouth nightly.   03/16/2014 at hs   . insulin detemir (LEVEMIR) 100 UNIT/ML injection Inject 25 Units into the skin every 12 (twelve) hours.   03/17/2014 at am   . insulin lispro (HUMALOG) 100 UNIT/ML injection Inject into the skin See Admin Instructions. Sliding Scale 3 times a day with meals   03/17/2014 at am   . pramipexole (MIRAPEX) 0.25 MG tablet Take 0.5 mg by mouth nightly.      03/16/2014 at hs   . rivaroxaban (XARELTO) 20 MG Tab Take 20 mg by mouth daily with dinner.   03/16/2014 at pm   . warfarin (COUMADIN) 4 MG tablet Take 4 mg by mouth daily.   03/16/2014 at hs   . INSULIN SYRINGE 1CC/29G 29G X 1/2" 1 ML Misc Use as directed 100 each 0           Results of Labs/imaging   Labs have been reviewed:   Coagulation Profile:    Recent Labs  Lab 03/17/14  1352   PT 14.8   PT INR 1.2   PTT 25        CBC review:   Recent Labs  Lab 03/17/14  1352   WBC 10.91*   HEMOGLOBIN 13.5   HEMATOCRIT 40.2   PLATELETS 208   MCV 86.5   RDW 13   NEUTROPHILS 90   LYMPHOCYTES AUTOMATED 4   EOSINOPHILS AUTOMATED 0   IMMATURE GRANULOCYTE 0   NEUTROPHILS ABSOLUTE 9.84*   ABSOLUTE IMMATURE GRANULOCYTE 0.01  Chem Review:  Recent Labs  Lab 03/17/14  1352   SODIUM 142   POTASSIUM 5.5*   CHLORIDE 105   CO2 22   BUN 28.9*   CREATININE 1.2*   GLUCOSE 448*   CALCIUM 9.9   BILIRUBIN, TOTAL 0.6   AST (SGOT) 20   ALT 12   ALKALINE PHOSPHATASE 134*      Results     Procedure Component Value Units Date/Time    Glucose Whole Blood - POCT [[id number]]  (Abnormal) Collected:  03/17/14 1552     POCT - Glucose Whole blood 357 (H) mg/dL Updated:  40/98/11 9147    Cell MorpHology [[id number]] Collected:  03/17/14 1352     Cell Morphology: Normal Updated:  03/17/14 1440     Platelet Estimate Normal     CBC with differential [[id number]]  (Abnormal) Collected:  03/17/14 1352    Specimen Information:  Blood / Blood Updated:  03/17/14 1440     WBC 10.91 (H) x10 3/uL      RBC 4.65 x10 6/uL      Hgb 13.5 g/dL      Hematocrit 29.5 %      MCV 86.5 fL      MCH 29.0 pg      MCHC 33.6 g/dL      RDW 13 %      Platelets 208 x10 3/uL      MPV 11.0 fL      Neutrophils 90 %      Lymphocytes Automated 4 %      Monocytes 5 %      Eosinophils Automated 0 %      Basophils Automated 0 %      Immature Granulocyte 0 %      Neutrophils Absolute 9.84 (H) x10 3/uL      Abs Lymph Automated 0.49 (L) x10 3/uL      Abs Mono Automated 0.57 x10 3/uL      Abs Eos Automated 0.00 x10 3/uL      Absolute Baso Automated 0.01 x10 3/uL      Absolute Immature Granulocyte 0.01 x10 3/uL     Comprehensive metabolic panel [[id number]]  (Abnormal) Collected:  03/17/14 1352    Specimen Information:  Blood Updated:  03/17/14 1437     Glucose 448 (H) mg/dL      BUN 10.2 (H) mg/dL      Creatinine 1.2 (H) mg/dL      Sodium 725  mEq/L      Potassium 5.5 (H) mEq/L      Chloride 105 mEq/L      CO2 22 mEq/L      CALCIUM 9.9 mg/dL      Protein, Total 7.5 g/dL      Albumin 4.2 g/dL      AST (SGOT) 20 U/L      ALT 12 U/L      Alkaline Phosphatase 134 (H) U/L      Bilirubin, Total 0.6 mg/dL      Globulin 3.3 g/dL      Albumin/Globulin Ratio 1.3      Anion Gap 15.0     UA with reflex to micro (all hospital ED's and [facility]) [[id number]] Resulted:  03/17/14 1436    Specimen Information:  Urine Updated:  03/17/14 1436    Troponin I [[id number]] Collected:  03/17/14 1352    Specimen Information:  Blood Updated:  03/17/14 1425     Troponin I 0.02 ng/mL     Creatinine Kinase (  CK) (NON CARDIAC) [[id number]] Collected:  03/17/14 1352     Creatine Kinase (CK) 164 U/L Updated:  03/17/14 1425    GFR [[id number]] Collected:  03/17/14 1352     EGFR 45.3 Updated:  03/17/14 1425    Protime-INR [[id number]] Collected:  03/17/14 1352    Specimen Information:  Blood Updated:  03/17/14 1420     PT 14.8 sec      PT INR 1.2      PT Anticoag. Given Within 48 hrs. None     APTT [[id number]] Collected:  03/17/14 1352     PTT 25 sec Updated:  03/17/14 1420    Glucose Whole Blood - POCT [[id number]]  (Abnormal) Collected:  03/17/14 1354     POCT - Glucose Whole blood 420 (H) mg/dL Updated:  24/40/10 2725         Radiology reports have been reviewed:  Radiology Results (24 Hour)     Procedure Component Value Units Date/Time    Chest AP Portable [[id number]] Collected:  03/17/14 1420    Order Status:  Completed Updated:  03/17/14 1425    Narrative:      HISTORY: Stroke evaluation.    COMPARISON: Chest x-ray from 04/24/2013.    EXAMINATION: Chest x-ray AP portable single view performed on  03/17/2014.    FINDINGS:   The cardiac silhouette is normal in size. The pulmonary vascularity is  within normal limits in appearance. No focal airspace opacities,  pneumothorax or pleural effusion. No acute bony findings.       Impression:        No acute cardiopulmonary  findings.    Quinn Plowman, MD   03/17/2014 2:21 PM      CT Head WO Contrast [[id number]] Collected:  03/17/14 1404    Order Status:  Completed Updated:  03/17/14 1412    Narrative:      HISTORY: Vertigo and speech difficulty. Stroke protocol.    COMPARISON: 03/28/2013    TECHNIQUE: Noncontrast CT imaging of the head was performed.    FINDINGS: There are patchy areas of hypoattenuation throughout the right  cerebral hemisphere which have the appearance of chronic infarctions.  There is ex vacuo dilatation of the right lateral ventricle. No  obviously new infarct is seen but if clinical symptoms persist, MR  imaging is available. There is no hemorrhage or shift. The basilar  cisterns are maintained. There is atherosclerosis.      Impression:        1. No acute intracranial abnormality is detected. There are chronic  appearing ischemic changes.    These urgent results were discussed with and acknowledged by the  emergency room physician caring for this patient, at about 1407 hours on  03/15/2014.    Otho Ket, MD   03/17/2014 2:08 PM           EKG: EKG reviewed   NSR, no acute ST  T wave changes           Hospitalist   Signed by: Sarahann Horrell V   03/17/2014 6:57 PM

## 2014-03-17 NOTE — Progress Notes (Addendum)
03/17/14 1700   NIH Stroke Scale   Interval Admission to units   Level of Consciousness (1a. ) 0   Question - age, month 0   Commands; Open and close eyes; Grip and release good hand 0   Gaze 0   Visual Fields 0   Facial Palsy 1   Motor Left Arm:  Arms (palm down) x 10 seconds sitting = 90 degrees supine = 45 degress 4   Motor Right Arm:  Arms (palm down) x 10 seconds sitting = 90 degrees supine = 45 degress 0   Total Motor Arms 4   Motor Left Leg x 5 seconds supine = 30 degrees 1   Motor Right  Leg x 5 seconds supine = 30 degrees 0   Total Motor Legs 1   Limb Ataxia finger to nose 1   If present, check each limb with ataxia if present Left arm   Sensory to pin prick 0   Best language describe picture, name items 0   Dysarthria  reads words from list 1   Extinction and Inattention 0   Total 8

## 2014-03-17 NOTE — Plan of Care (Addendum)
Problem: Safety  Goal: Patient will be free from injury during hospitalization  Outcome: Progressing  Intervention: Provide and maintain safe environment  Bed in lowest position. Bed alarm set.   Intervention: Ensure appropriate safety devices are available at the bedside  Call bell, telephone and personal belongings within patient's reach.   Intervention: Hourly rounding.  Patient resting in bed. Patient unable to walk at this time.       Problem: Pain  Goal: Patient's pain/discomfort is manageable  Outcome: Progressing  Patient denies pain.

## 2014-03-17 NOTE — ED Provider Notes (Signed)
Physician/Midlevel provider first contact with patient: 03/17/14 1337         History     Chief Complaint   Patient presents with   . Dizziness     HPI Comments: 63 year old female with history of stroke with residual left sided weakness presents to the emergency room complaining of vertigo which she noticed when she woke up this morning around 6 AM.  Last normal was last night.  Positive nausea and vomiting.  Patient is concerned that she is having a stroke.  She has a headache.  5 out of 10.  Frontal.  Nothing makes it better or worse.  She says she has difficulty speaking this a.m.  She says she feel to the floor this morning and was laying on the floor, but she does not think it was for very long.  She denies any injury.  She says she is weaning off her [place] Ramp also and starting Coumadin.    The history is provided by the patient. The history is limited by the condition of the patient.            Past Medical History   Diagnosis Date   . Cerebrovascular accident    . Migraine    . PFO (patent foramen ovale)    . Diabetes mellitus    . Hyperlipidemia    . Seasonal allergies        Past Surgical History   Procedure Laterality Date   . Cholecystectomy     . Hysterectomy     . Total abdominal hysterectomy w/ bilateral salpingo-oophorectomy     . Carpal tunnel release       L   . Cataract extraction Left 03/03/2014       Family History   Problem Relation Age of Onset   . Diabetes Mother    . Cancer Mother    . COPD Father    . Cancer Sister    . Stroke Paternal Grandfather        Social  History   Substance Use Topics   . Smoking status: Never Smoker    . Smokeless tobacco: Never Used   . Alcohol Use: Yes      Comment: socially-- maybe once or twice a month       .     Allergies   Allergen Reactions   . Penicillins Rash       Current Discharge Medication List      CONTINUE these medications which have NOT CHANGED    Details   atorvastatin (LIPITOR) 80 MG tablet Take 80 mg by mouth daily.      baclofen (LIORESAL) 10  MG tablet Take 10 mg by mouth 3 (three) times daily.      ciprofloxacin (CILOXAN) 0.3 % ophthalmic solution Place 1 drop into the left eye 4 (four) times daily.      Dextromethorphan-Quinidine 20-10 MG Cap Take 1 capsule by mouth nightly.      insulin detemir (LEVEMIR) 100 UNIT/ML injection Inject 25 Units into the skin every 12 (twelve) hours.      insulin lispro (HUMALOG) 100 UNIT/ML injection Inject into the skin See Admin Instructions. Sliding Scale 3 times a day with meals      pramipexole (MIRAPEX) 0.25 MG tablet Take 0.5 mg by mouth nightly.         rivaroxaban (XARELTO) 20 MG Tab Take 20 mg by mouth daily with dinner.      warfarin (COUMADIN) 4 MG tablet Take 4  mg by mouth daily.      INSULIN SYRINGE 1CC/29G 29G X 1/2" 1 ML Misc Use as directed  Qty: 100 each, Refills: 0              Review of Systems   Constitutional: Negative for fever and chills.   HENT: Negative for congestion, rhinorrhea and sore throat.    Respiratory: Negative for cough, chest tightness and shortness of breath.    Cardiovascular: Negative for chest pain and palpitations.   Gastrointestinal: Positive for nausea and vomiting. Negative for abdominal pain and diarrhea.   Genitourinary: Negative for dysuria and frequency.   Musculoskeletal: Negative for myalgias and back pain.   Skin: Negative for color change and rash.   Neurological: Positive for dizziness, speech difficulty, weakness and headaches.   Psychiatric/Behavioral: Negative for confusion. The patient is not nervous/anxious.        Physical Exam    BP: (!) 167/102 mmHg, Heart Rate: 98, Temp: 97.4 F (36.3 C), Resp Rate: 16, SpO2: 97 %, Weight: 77.111 kg    Physical Exam   Constitutional: She is oriented to person, place, and time. She appears well-developed and well-nourished.   Hypertensive   HENT:   Head: Normocephalic and atraumatic.   Eyes: Conjunctivae are normal. Pupils are equal, round, and reactive to light.   Does not look to the left on my command   Neck: Normal range  of motion. Neck supple.   Cardiovascular: Normal rate, regular rhythm and normal heart sounds.    Pulmonary/Chest: Effort normal and breath sounds normal. No respiratory distress. She has no wheezes. She has no rales.   Abdominal: Soft. She exhibits no distension. There is no tenderness. There is no rebound and no guarding.   Musculoskeletal: Normal range of motion. She exhibits no edema or tenderness.   Neurological: She is alert and oriented to person, place, and time. No cranial nerve deficit.   Patient cannot fully follow my commands, she does not look fully to the left, with residual left arm weakness   Skin: Skin is warm and dry.   Psychiatric: She has a normal mood and affect. Her speech is normal and behavior is normal. Judgment and thought content normal. Cognition and memory are impaired. She exhibits abnormal recent memory and abnormal remote memory.   Nursing note and vitals reviewed.        MDM and ED Course     ED Medication Orders     Start     Status Ordering Provider    03/17/14 1449  insulin regular (HumuLIN R,NovoLIN R) injection 5 Units   Once     Route: Intravenous  Ordered Dose: 5 Units     Last MAR action:  Given Leticia Clas    03/17/14 1449  aspirin suppository 300 mg   Once     Route: Rectal  Ordered Dose: 300 mg     Last MAR action:  Given Jadarrius Maselli H    03/17/14 1343  promethazine (PHENERGAN) injection 12.5 mg   Once     Route: Intravenous  Ordered Dose: 12.5 mg     Last MAR action:  Given Rishika Mccollom H    03/17/14 1343  sodium chloride 0.9 % bolus 1,000 mL   Once     Route: Intravenous  Ordered Dose: 1,000 mL     Last MAR action:  New Bag Marquasha Brutus H              MDM  Number  of Diagnoses or Management Options  Other specified transient cerebral ischemias:   Vertigo:   Diagnosis management comments: Differential diagnosis: Peripheral vertigo, central vertigo, mass, bleed  Plan: Labs, EKG, head CT    I, Nita Sells, M.D, have been the primary provider for this patient during this Emergency Dept  visit.    Oxygen saturation by pulse oximetry is 95%-100%, Normal.  Interventions: None Needed    ECG Interpretation:  Rate: Normal  Rhythm: Normal Sinus  Axis: Nl  Blocks: NONE  ST segments: normal  Q waves: NONE  Impression: Normal ECG  Compared to January 2015    On reexam, patient speech continues to be normal.  We will admit for MRI and further workup , given risk for stroke.       Amount and/or Complexity of Data Reviewed  Clinical lab tests: reviewed and ordered  Tests in the radiology section of CPT: ordered and reviewed  Discuss the patient with other providers: yes (Hospitalist-- will admit)    Patient Progress  Patient progress: improved         Stroke/tPA Management  Date/Time: 03/18/2014 7:48 AM  Performed by: Nita Sells H  Authorized by: Edwina Barth  Stroke/tPA: Best estimate of time since last normal exceeds the recommended window for tPA treatment..      Clinical Impression & Disposition     Clinical Impression  Final diagnoses:   Vertigo   Other specified transient cerebral ischemias        ED Disposition     Observation Admitting Physician: Camillo Flaming [[id number]]  Diagnosis: TIA (transient ischemic attack) [981191]  Estimated Length of Stay: < 2 midnights  Tentative Discharge Plan?: Home or Self Care [1]  Patient Class: Observation [104]             Current Discharge Medication List                      Leticia Clas, MD  03/18/14 [phone]

## 2014-03-17 NOTE — Plan of Care (Signed)
TOR-BSST: The [place] Bedside Swallowing Screening Test      Time of Evaluation: ____1800______      A) Before water intake:      1.  Have patient say "ah" and judge voice quality    Abnormal or Normal: ____Normal____________  2.  Ask patient to stick tongue out and then move it from side to side.    Abnormal or Normal: _______Normal_________    B)  Water intake:    Have the patient sit upright and give water.  Ask patient to say "ah" after each intake.  Mark as abnormal if you note any of the following signs: coughing, change in voice quality or drooling.  If abnormal, stop water intake and advance to "D."    1.  Give one teaspoon of water at a time.  Stop immediately for any abnormal signs as listed above.  If swallowing normally, repeat for a total of 10 teaspoons.    Abnormalities noted:  ______NO__    If yes, which swallow? _______    If yes, what was the abnormality? ______________________      If no, continue the test.    C) Cup Drinking: If all 10 teaspoons were swallowed normally.  Allow the patient to drink from the cup.  Wait 1 minute after the end of the water swallows.  Ask patient to say "ah" and judge voice quality.    Abnormal or Normal: _____Normal___________    D)  Results:  No abnormal signs (pass) or 1 or more abnormal signs (fail) ____Pass______

## 2014-03-17 NOTE — Progress Notes (Addendum)
03/17/14 1700   Provider Notification   Reason for Communication Other (Comment)  (Pt is nauseated and burping vomitus. nausea medication?)   Provider Name Dr. Carlyon Shadow   Provider Role Hospitalist   Method of Communication Page     Nausea medication ordered by Dr. Carlyon Shadow

## 2014-03-18 ENCOUNTER — Telehealth (INDEPENDENT_AMBULATORY_CARE_PROVIDER_SITE_OTHER): Payer: Self-pay

## 2014-03-18 ENCOUNTER — Observation Stay: Payer: No Typology Code available for payment source

## 2014-03-18 DIAGNOSIS — E118 Type 2 diabetes mellitus with unspecified complications: Secondary | ICD-10-CM | POA: Insufficient documentation

## 2014-03-18 DIAGNOSIS — R112 Nausea with vomiting, unspecified: Secondary | ICD-10-CM | POA: Insufficient documentation

## 2014-03-18 LAB — BASIC METABOLIC PANEL
Anion Gap: 8 (ref 5.0–15.0)
BUN: 22.1 mg/dL — ABNORMAL HIGH (ref 7.0–19.0)
CO2: 25 mEq/L (ref 22–29)
Calcium: 8.9 mg/dL (ref 8.5–10.5)
Chloride: 106 mEq/L (ref 100–111)
Creatinine: 0.9 mg/dL (ref 0.6–1.0)
Glucose: 351 mg/dL — ABNORMAL HIGH (ref 70–100)
Potassium: 3.8 mEq/L (ref 3.5–5.1)
Sodium: 139 mEq/L (ref 136–145)

## 2014-03-18 LAB — LIPID PANEL
Cholesterol / HDL Ratio: 3
Cholesterol: 125 mg/dL (ref 0–199)
HDL: 41 mg/dL (ref >40)
LDL Calculated: 65 mg/dL (ref 0–99)
Triglycerides: 94 mg/dL (ref 34–149)
VLDL Calculated: 19 mg/dL (ref 10–40)

## 2014-03-18 LAB — HEMOGLOBIN A1C: Hemoglobin A1C: 9.4 % — ABNORMAL HIGH (ref 0.0–6.0)

## 2014-03-18 LAB — HEMOLYSIS INDEX: Hemolysis Index: 3 (ref 0–18)

## 2014-03-18 LAB — GLUCOSE WHOLE BLOOD - POCT
Whole Blood Glucose POCT: 325 mg/dL — ABNORMAL HIGH (ref 70–100)
Whole Blood Glucose POCT: 302 mg/dL — ABNORMAL HIGH (ref 70–100)

## 2014-03-18 LAB — GFR: EGFR: >60

## 2014-03-18 MED ORDER — ACETAMINOPHEN 325 MG PO TABS
650.00 mg | ORAL_TABLET | Freq: Four times a day (QID) | ORAL | Status: DC | PRN
Start: 2014-03-18 — End: 2014-03-18
  Administered 2014-03-18: 650 mg via ORAL
  Filled 2014-03-18: qty 2

## 2014-03-18 MED ORDER — LORAZEPAM 2 MG/ML IJ SOLN
1.00 mg | Freq: Once | INTRAMUSCULAR | Status: AC
Start: 2014-03-18 — End: 2014-03-18
  Administered 2014-03-18: 1 mg via INTRAVENOUS
  Filled 2014-03-18: qty 1

## 2014-03-18 MED ORDER — INSULIN LISPRO 100 UNIT/ML SC SOLN
1.00 [IU] | SUBCUTANEOUS | Status: AC
Start: 2014-03-18 — End: ?

## 2014-03-18 MED ORDER — CITALOPRAM HYDROBROMIDE 10 MG PO TABS
10.00 mg | ORAL_TABLET | Freq: Every day | ORAL | Status: AC
Start: 2014-03-18 — End: ?

## 2014-03-18 NOTE — Plan of Care (Signed)
Problem: Safety  Goal: Patient will be free from injury during hospitalization  Outcome: Progressing  Patient is alert and oriented. Is compliant with using the call bell before getting out of bed. Strength is not at baseline at this time, has PT/OT orders. Hourly rounding ongoing, daughter at bedside. Will continue to monitor, call bell within reach.    Problem: Pain  Goal: Patient's pain/discomfort is manageable  Outcome: Progressing  Patient c/o moderate to severe left sided pain that started after MRI. Does not appear to be in distress. Will obtain PRN pain medicine for relief. Will continue to monitor.    Problem: Urinary Incontinence  Goal: Perineal skin integrity is maintained or improved  Assess genitourinary system, perineal skin, labs (urinalysis), and history of incontinence to include past management, aggravating, and alleviating factors. Collaborate with interdisciplinary team and initiate plans and interventions as needed.   Outcome: Progressing  Patient has been incontinent during this admission, not patient's baseline. Normally continent at home. Ultrazorb pads used, perineal care done. No skin breakdown noted. Will continue to monitor.

## 2014-03-18 NOTE — Consults (Signed)
[facility] C[facility] CONSULTATION REPORT  [facility]    Date Time: 03/18/2014 2:41 PM  Patient Name: Katherine [name] A  Requesting Physician: Edwina Barth       Reason for Consultation:   Nausea and vomiting, left sided weakness      History:   Katherine [name] is a 63 y.o. female admitted on 03/17/2014.  We have been asked by Edwina Barth,*,  to provide cardiac consultation, regarding anticoagulation in the setting of coumadin resistance.  The patient is admitted after a fall due to left sided weakness and vertigo with nausea and vomiting.  She has a history of prior CVA 2014 with residual left sided weakness and had been on xarelto without any complication.  Unfortunately, her insurance denied coverage and she has been taking high dose coumadin in it's place.  Despite careful monitoring, she has had subtherapeutic INRs and it was 1.2 on admission.  She is being worked up for recurrent TIA.    Past Medical History:     Past Medical History   Diagnosis Date   . Cerebrovascular accident    . Migraine    . PFO (patent foramen ovale)    . Diabetes mellitus    . Hyperlipidemia    . Seasonal allergies        Past Surgical History:     Past Surgical History   Procedure Laterality Date   . Cholecystectomy     . Hysterectomy     . Total abdominal hysterectomy w/ bilateral salpingo-oophorectomy     . Carpal tunnel release       L   . Cataract extraction Left 03/03/2014       Family History:     Family History   Problem Relation Age of Onset   . Diabetes Mother    . Cancer Mother    . COPD Father    . Cancer Sister    . Stroke Paternal Grandfather        Social History:     History     Social History   . Marital Status: Married     Spouse Name: N/A     Number of Children: N/A   . Years of Education: N/A     Social History Main Topics   . Smoking status: Never Smoker    . Smokeless tobacco: Never Used   . Alcohol Use: Yes      Comment: socially-- maybe once or twice a month   . Drug Use: No    . Sexual Activity: Not on file     Other Topics Concern   . Not on file     Social History Narrative       Allergies:     Allergies   Allergen Reactions   . Penicillins Rash       Medications:     Prescriptions prior to admission   Medication Sig   . atorvastatin (LIPITOR) 80 MG tablet Take 80 mg by mouth daily.   . baclofen (LIORESAL) 10 MG tablet Take 10 mg by mouth 3 (three) times daily.   . ciprofloxacin (CILOXAN) 0.3 % ophthalmic solution Place 1 drop into the left eye 4 (four) times daily.   . insulin detemir (LEVEMIR) 100 UNIT/ML injection Inject 25 Units into the skin every 12 (twelve) hours.   . pramipexole (MIRAPEX) 0.25 MG tablet Take 0.5 mg by mouth nightly.      . rivaroxaban (XARELTO) 20 MG Tab Take 20 mg by mouth  daily with dinner.   . warfarin (COUMADIN) 4 MG tablet Take 4 mg by mouth daily.   . [DISCONTINUED] Dextromethorphan-Quinidine 20-10 MG Cap Take 1 capsule by mouth nightly.   . [DISCONTINUED] insulin lispro (HUMALOG) 100 UNIT/ML injection Inject into the skin See Admin Instructions. Sliding Scale 3 times a day with meals   . INSULIN SYRINGE 1CC/29G 29G X 1/2" 1 ML Misc Use as directed       Current Facility-Administered Medications   Medication Dose Route Frequency Provider Last Rate Last Dose   . 0.45% NaCl infusion   Intravenous Continuous Vallabhaneni, Madhuri V, MD 100 mL/hr at 03/17/14 1825 100 mL/hr at 03/17/14 1825   . acetaminophen (TYLENOL) tablet 650 mg  650 mg Oral 4X Daily PRN Edwina Barth, MD   650 mg at 03/18/14 1154   . atorvastatin (LIPITOR) tablet 80 mg  80 mg Oral Daily Edwina Barth, MD   80 mg at 03/17/14 2124   . baclofen (LIORESAL) tablet 10 mg  10 mg Oral TID Edwina Barth, MD   10 mg at 03/18/14 1020   . ciprofloxacin (CILOXAN) 0.3 % ophthalmic solution 1 drop  1 drop Left Eye QID Edwina Barth, MD   1 drop at 03/18/14 1439   . dextrose 50 % bolus 25 mL  25 mL Intravenous PRN Vallabhaneni, Madhuri V, MD       . glucagon (rDNA)  (GLUCAGEN) injection 1 mg  1 mg Intramuscular PRN Vallabhaneni, Madhuri V, MD       . influenza quadrivalent-split vaccine (PF) (FLUARIX/FLULAVAL/FLUZONE) IM injection 0.5 mL  0.5 mL Intramuscular Once Vallabhaneni, Madhuri V, MD       . insulin aspart (NovoLOG) injection 1-3 Units  1-3 Units Subcutaneous QHS PRN Brynda Rim, MD   3 Units at 03/17/14 2231   . insulin aspart (NovoLOG) injection 1-5 Units  1-5 Units Subcutaneous TID AC PRN Edwina Barth, MD   4 Units at 03/18/14 1222   . insulin glargine (LANTUS) injection 25 Units  25 Units Subcutaneous Q12H Edwina Barth, MD   25 Units at 03/18/14 [phone]   . labetalol (NORMODYNE,TRANDATE) injection 10 mg  10 mg Intravenous Q15 Min PRN Vallabhaneni, Madhuri V, MD       . ondansetron (ZOFRAN) injection 4 mg  4 mg Intravenous Q8H PRN Vallabhaneni, Madhuri V, MD       . pramipexole (MIRAPEX) tablet 0.5 mg  0.5 mg Oral QHS Vallabhaneni, Madhuri V, MD   0.5 mg at 03/17/14 2124   . rivaroxaban (XARELTO) tablet 20 mg  20 mg Oral Daily with dinner Edwina Barth, MD   20 mg at 03/17/14 1826         Review of Systems:    Comprehensive review of systems including constitutional, eyes, ears, nose, mouth, throat, cardiovascular, GI, GU, musculoskeletal, integumentary, respiratory, neurologic, psychiatric, and endocrine is negative other than what is mentioned already in the history of present illness    Physical Exam:     Filed Vitals:    03/18/14 1400   BP: 139/69   Pulse: 89   Temp: 98.1 F (36.7 C)   Resp: 18   SpO2: 97%     Temp (24hrs), Avg:98 F (36.7 C), Min:97.2 F (36.2 C), Max:98.8 F (37.1 C)      Intake and Output Summary (Last 24 hours) at Date Time    Intake/Output Summary (Last 24 hours) at 03/18/14 1441  Last data filed at 03/18/14 1316  Gross per 24 hour   Intake      0 ml   Output    680 ml   Net   -680 ml       GENERAL: Patient is in no acute distress   HEENT: No scleral icterus or conjunctival pallor, normocephalic    NECK: No jugular venous distention or thyromegaly, normal carotid upstrokes without bruits   CARDIAC: Normal apical impulse, regular rate and rhythm, with normal S1 and S2, and no murmurs, rubs, or gallops   CHEST: Clear to auscultation bilaterally, normal respiratory effort  ABDOMEN: No abdominal bruits, masses, or hepatosplenomegaly, nontender, non-distended, good bowel sounds   EXTREMITIES: No clubbing, cyanosis, or edema, 2+ DP and radial pulses bilaterally  SKIN: No rash or jaundice   NEUROLOGIC: Alert and oriented to time, place and person, normal mood and affect  MUSCULOSKELETAL: Normal muscle strength and tone, except for left sided weakness.      Labs Reviewed:       Recent Labs  Lab 03/17/14  1352   CREATINE KINASE (CK) 164   TROPONIN I 0.02     No results for input(s): DIG in the last 168 hours.    Recent Labs  Lab 03/18/14  0712   CHOLESTEROL 125   TRIGLYCERIDES 94   HDL 41   CALCULATED LDL 65       Recent Labs  Lab 03/17/14  1352   BILIRUBIN, TOTAL 0.6   PROTEIN, TOTAL 7.5   ALBUMIN 4.2   ALT 12   AST (SGOT) 20     No results for input(s): MG in the last 168 hours.    Recent Labs  Lab 03/17/14  1352   PT 14.8   PT INR 1.2   PTT 25       Recent Labs  Lab 03/17/14  1352   WBC 10.91*   HEMOGLOBIN 13.5   HEMATOCRIT 40.2   PLATELETS 208       Recent Labs  Lab 03/18/14  0712 03/17/14  1352   SODIUM 139 142   POTASSIUM 3.8 5.5*   CHLORIDE 106 105   CO2 25 22   BUN 22.1* 28.9*   CREATININE 0.9 1.2*   EGFR >60.0 45.3   GLUCOSE 351* 448*   CALCIUM 8.9 9.9         Radiology   Radiological Procedure reviewed.      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

## 2014-03-18 NOTE — Discharge Summary -  Nursing (Signed)
Patient discharged to home via wheelchair. Discussed d/c instructions with patient and daughter, both verbalized understanding. Rx sent directly to patient's pharmacy.

## 2014-03-18 NOTE — OT Eval Note (Signed)
[facility]  [address]  [place], [place]. [address]    Department of Rehabilitation Services  [phone]    Occupational Therapy Evaluation    Patient: Katherine [name]    MRN#: [id number]     [id number]    Time of treatment: Time Calculation  OT Received On: 03/18/14  Start Time: 3329  Stop Time: 0958  Time Calculation (min): 16 min       Consult received for [employer] for OT Evaluation and Treatment.  Patient's medical condition is appropriate for Occupational therapy intervention at this time.    Assessment:   Katherine [name] is a 63 y.o. female admitted 03/17/2014 presenting s/p fall; sizziness.    Impairments: Assessment: Appears to be at baseline for ADL's    Therapy Diagnosis:None    Rehabilitation Potential: Prognosis: Good;With continued OT s/p acute discharge      Plan:   OT Frequency Recommended: one time visit   Treatment Interventions: No skilled interventions needed at this time     Patient Goal  Patient Goal:  (to go home)    Risks/Benefits/POC Discussed with Pt/Family: With patient    Goals: N/A due to no skilled OT intervention warranted at this time.                                        Discharge Recommendations:   Based on today's session patient's discharge recommendation is the following: Discharge Recommendation: Home with supervision;Home with home health OT.    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 female admitted on 03/17/2014 with "the chief complaint of dizziness . patient woke up today morning and felt very dizzy and lightheaded . she has been unsteady on her feet and when she tried to walk.  She lost her balance and fell to the ground and hit her head . no loss of consciousness . she then tried to manage to get up and the tried to go upstairs to  call her son-in-law, but he was not at home . she was sitting on the stairs and later son-in-law came back home, brought her to the hospital . patient has been having headache after the fall . she was also complaining of nausea, vomiting and had vomited twice . 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  Denies any ringing in the ears or spinning sensation.  Patient is in the process of changing from Xarelto to  Coumadin as the insurance was not covering the medication . however, her INR has been low despite the Coumadin and the cardiologist was planning to talk to the insurance about approving Xarelto for her."-as per H & P note.        Patient Active Problem List   Diagnosis   . Dysarthria   . History of DVT (deep vein thrombosis)   . PFO (patent foramen ovale)   . DM (diabetes mellitus)   . Hyperlipidemia   . Chronic migraine   . Dizziness   . UTI (lower urinary tract infection)   . DKA (diabetic ketoacidoses)   . C. difficile colitis   .  Dizziness   . Non-intractable vomiting with nausea   . AKI (acute kidney injury)   . Type 2 diabetes mellitus with complication   . Non-intractable vomiting with nausea, vomiting of unspecified type        Past Medical/Surgical History:  Past Medical History   Diagnosis Date   . Cerebrovascular accident    . Migraine    . PFO (patent foramen ovale)    . Diabetes mellitus    . Hyperlipidemia    . Seasonal allergies       Past Surgical History   Procedure Laterality Date   . Cholecystectomy     . Hysterectomy     . Total abdominal hysterectomy w/ bilateral salpingo-oophorectomy     . Carpal tunnel release       L   . Cataract extraction Left 03/03/2014         Social History:  Prior Level of Function  Prior level of function: Independent with ADLs, Ambulates independently  Baseline Activity Level: Community ambulation, Household ambulation  Driving: does not drive  Cooking: Yes  DME Currently at Home: Single point cane,  Wheelchair-manual (Hemiwalker)  Home Living Arrangements  Living Arrangements: Children (Son in law works from home)  Type of Home: House  Home Layout: Multi-level (Bed/bathroom in basement)  Bathroom Shower/Tub: Walk-in shower  Bathroom Toilet: Raised  Bathroom Equipment: Paediatric nurse, Grab bars around toilet  DME Currently at Home: Single point cane, Wheelchair-manual (Hemiwalker)      Subjective:   Patient is agreeable to participation in the therapy session. Nursing clears patient for therapy.  Subjective:  (Patient denies pain currently)  Pain Assessment  Pain Assessment: No/denies pain.        Objective:   Observation of Patient/Vital Signs:  Patient is out of bed, ambulating with telemetry and peripheral IV in place.    Inspection/Posture: LUE in flexion synergy, though pt able to move elbow and shoulder slightly. Flexed wrist and hand.    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)  Hand Dominance: right handed    Gross ROM  Right Upper Extremity ROM: within functional limits  Left Upper Extremity ROM:  (in flexor synergy; minimal digit mvmts; slight elbow F/E; shoulder elevation only)  Gross Strength  Right Upper Extremity Strength: 4+/5  Left Upper Extremity Strength:  (NT due to tonal changes)          Sensory  Auditory: intact  Tactile - Light Touch: intact (as per Patient report)  Visual Acuity: wears glasses       Self-care and Home Management  Grooming: Contact Guard Assist;standing at sink;wash/dry hands;steadying;verbal prompting  Toileting: Contact Guard Assist;clothing management up;clothing management down;steadying;verbal prompting;grab bar use  Functional Transfers: Contact  Guard Assist;toilet transfer;steadying;verbal prompting    Mobility and Transfers  Functional Mobility/Ambulation: Contact Guard Assist (holding onto IV pole for steadying)     Balance  Static Sitting Balance: good  Static Standing Balance: fair (with UE support)  Dynamic Standing Balance: fair (with UE support)    Participation and Endurance  Participation Effort: good  Endurance: Tolerates 10 - 20 min exercise with multiple rests; Patient denied feeling lightheaded or dizzy during the session.      Treatment Activities: Patient instructed  in proper pacing with transitional mvmts, encouraging Patient to wait atleast 60 secs. To notice how she feels (lightheaded or dizzy) before attempting to stand/walk for fall prevention. Patient verbalized understanding of same. Discussed home bathroom safety with recommendations made for shower seat and grab bars and for Patient to have Supervision with shower transfers upon d/c to home to ensure safety/fall prevention. Patient ageed to same. Patient educated in LB dressing techniques with recommendations made to sit to initiate pants/underwear over feet, then to stand to pull pants up over hips for fall prevention/energy conservation. Pt. Instructed to perform R UE AROM exercises and self-ROM for L UE for shoulder, elbow and digit F/E intermittently throughout the day to increase endurance and strength for ADL's with visual demonstration provided to increase clarity. Patient reporting she performs self-ROM exercises daily and wears a splint for contracture prevention L hand/wrist at night-time only. Patient seated in arm-chair at end of session with all needs within reach. Patient instructed to ring for nursing for all needs and appeared receptive to same.    Educated the patient to role of occupational therapy, plan of care, goals of therapy and HEP, safety with mobility and ADLs, home safety.      Tennis Ship. Trixie Deis, MS,OTR/L  Pager # [phone]  [phone]

## 2014-03-18 NOTE — Plan of Care (Signed)
MODIFIED RANKIN SCALE    0 No symptoms at all.    1 No significant disability despite symptoms; able to carry out all usual duties and activities.    2 Slight disability; unable to carry out all previous activities, but able to look after own affairs without assistance.    3 Moderate disability; requiring some help, but able to walk without assistance.    4 Moderately severe disability; unable to walk without assistance and unable to attend to own bodily needs without assistance.    5 Severe disability; bedridden, incontinent and requiring constant nursing care and attention.    6 Dead.      TOTAL (0-6): __3_____

## 2014-03-18 NOTE — PT Eval Note (Signed)
[facility]  [address]  [place], [place]. [address]    Department of Rehabilitation  [phone]    Physical Therapy Evaluation    Patient: Katherine [name]    MRN#: [id number]     [id number]/[id number]    Time of treatment: Time Calculation  PT Received On: 03/18/14  Start Time: 0925  Stop Time: 0958  Time Calculation (min): 33 min    PT Visit Number: 1    Consult received for Katherine [name] for PT Evaluation and Treatment.  Patient's medical condition is appropriate for Physical therapy intervention at this time.      Assessment:   Katherine [name] is a 63 y.o. female admitted 03/17/2014 presenting with dizziness, syncope.     Impairments: Assessment: Decreased endurance/activity tolerance;Appears to be at baseline for mobility.     Therapy Diagnosis: None.    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 female admitted on 03/17/2014 with "dizziness . patient woke up today morning and felt very dizzy and lightheaded . she has been unsteady on her feet and when she tried to walk.  She lost her balance and fell to the ground and hit her head . no loss of consciousness . she then tried to manage to get up and the tried to go upstairs to call her son-in-law, but he was not at home" per H&P.    Patient Active Problem List   Diagnosis   . Dysarthria   . History of DVT (deep vein  thrombosis)   . PFO (patent foramen ovale)   . DM (diabetes mellitus)   . Hyperlipidemia   . Chronic migraine   . Dizziness   . UTI (lower urinary tract infection)   . DKA (diabetic ketoacidoses)   . C. difficile colitis   . Dizziness   . Non-intractable vomiting with nausea   . AKI (acute kidney injury)        Past Medical/Surgical History:  Past Medical History   Diagnosis Date   . Cerebrovascular accident    . Migraine    . PFO (patent foramen ovale)    . Diabetes mellitus    . Hyperlipidemia    . Seasonal allergies       Past Surgical History   Procedure Laterality Date   . Cholecystectomy     . Hysterectomy     . Total abdominal hysterectomy w/ bilateral salpingo-oophorectomy     . Carpal tunnel release       L   . Cataract extraction Left 03/03/2014         X-Rays/Tests/Labs:  MRI Brain  IMPRESSION:        1. No acute infarct, intracranial hemorrhage, or mass lesion.  2. Remote infarcts involving the MCA territory on the right.  3. 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  There are luminal irregularities/stenoses of the distal right posterior  cerebral artery, which remains patent.  4. Only trace flow related enhancement evident in the mid cervical  aspect of the left vertebral artery, which could indicate occlusion or  slow flow in a markedly hypoplastic vessel. This finding is of uncertain  chronicity.  5. There is atherosclerotic disease at the carotid bifurcations, but no  high-grade carotid stenosis is identified.      Social History:  Prior Level of Function  Prior level of function: Independent with ADLs, Ambulates independently  Baseline Activity Level: Community ambulation, Household ambulation  Driving: does not drive  Cooking: Yes  DME Currently at Home: Single point cane, Wheelchair-manual (Hemiwalker)  Home Living Arrangements  Living Arrangements: Children (Son in law works from home)  Type of Home: House  Home Layout: Multi-level (Bed/bathroom in basement)  Bathroom  Shower/Tub: Walk-in shower  Bathroom Toilet: Raised  Bathroom Equipment: Paediatric nurse, Grab bars around toilet  DME Currently at Home: Single point cane, Wheelchair-manual (Hemiwalker)      Subjective:    Patient is agreeable to participation in the therapy session. Nursing clears patient for therapy. No c/o pain currently. Reports feeling much better compared to yesterday.   Patient Goal  Patient Goal: To return home       Objective:   Observation of Patient/Vital Signs:  Patient is in bed with telemetry and peripheral IV in place.    Inspection/Posture  Inspection/Posture: LUE in flexion synergy, though pt able to move elbow and shoulder slightly. Flexed wrist and hand.    Cognition  Arousal/Alertness: Appropriate responses to stimuli  Attention Span: Appears intact  Orientation Level: Oriented X4  Memory: Appears intact  Following Commands: Follows all commands and directions without difficulty  Safety Awareness: minimal verbal instruction  Insights: Fully aware of deficits  Neuro Status  Behavior: attentive;cooperative;calm  Coordination: FMC impaired;GMC impaired (LUE/LE)    Sensation: intact light touch BLE    Gross ROM  Right Lower Extremity ROM: within functional limits  Left Lower Extremity ROM: within functional limits  Gross Strength  Right Lower Extremity Strength: 4/5  Left Lower Extremity Strength: 4/5       Functional Mobility  Supine to Sit: Supervision;Increased Time;Increased Effort;using bedrail;HOB raised  Scooting to EOB: Supervision  Sit to Stand: Supervision  Stand to Sit: Supervision     Locomotion  Ambulation: Contact Guard Assist (Pushing IV pole)  Ambulation Distance (Feet): 150 Feet  Pattern: L foot decreased clearance;L steppage;L foot drop;decreased step length;decreased cadence (Fatigued with ambulation bout. Some difficulty navigating in small spaces, especially with IV pole.)     Balance  Balance: needs focused assessment  Sitting - Static: Good  Sitting - Dynamic: Good  Standing -  Static: Good  Standing - Dynamic: Fair    Participation and Endurance  Participation Effort: good  Endurance: Tolerates 10 - 20 min exercise with multiple rests. No c/o dizziness or SOB during mobility.    Treatment Activities: Ambulated to/from bathroom in room with CGA and assistance to manage IV pole in small spaces. Supervision for standing balance at sink as pt fatigued from ambulation in hallway. ambulated to chair with increased instruction for safety. Educated in and performed AP, LAQ and seated marching. Encouraged to perform LE therex throughout the day to decrease effects of immobility. Encouraged to sit OOB as tolerated and ambulate with RN staff or family until d/c for pulmonary hygiene. Instructed not to get up  without assistance for safety.    Instructed to use SPC or hemiwalker on initial d/c for safety and stability, as endurance noted to be lower due to hospitalization. Educated in energy conservation techniques upon return home. Instructed to limit the number of times pt performs stairs, sit during all ADLs and take ample rest breaks as needed. Instructed to have supervision on stairs and during shower transfers initially for safety. Pt verbalized understanding for all education provided.    Educated the patient to role of physical therapy, plan of care, goals of therapy and HEP, safety with mobility and ADLs, energy conservation techniques, home safety.    At end of session pt seated upright in chair, call bell and items in reach. RN aware.      Delfin Edis, PT, DPT  Pager #: [phone]

## 2014-03-18 NOTE — Progress Notes (Signed)
Discharge Planning:    D/C Disposition: home with home health care   Expected D/C Date: 24 to 48 hrs.     Case Management Initial Discharge Planning Assessment     Psychosocial/Demographic Information   Name of interviewee/s: Patient and her daughter Judeth Cornfield who is at the bedside.    Orientation and decision making abilities of patient (ie a&ox3 able to make decisions, demented patient, patient on vent, etc)   AAox4 and able to make decisions.    Does the patient have an Advance Directive? Location? (home/on chart, if home-advised to bring in copy?) <no information>  Advance Directive: Patient has advance directive, copy not in chart] CM requested that a copy be brought to the hospital at the patients earliest convenience. Judeth Cornfield requested the fax number and the medical records fax number was given.    Healthcare Decision Maker (HDM) (if other than the patient) Include relationship and contact information.  Self.    Any additional emergency contacts? Extended Emergency Contact Information  Primary Emergency Contact: [facility]  Address: [address]            [place], [place] [address] [place]  Home Phone: [phone]  Relation: Daughter   Pt lives with:  Living Arrangements: Children (Son in law works from home)]   Type of residence where patient lives:   Type of Home: House]  Home Layout: Multi-level (Bed/bathroom in basement)]  Bathroom Shower/Tub: Walk-in shower]  Firefighter: Raised]  Oceanographer: Paediatric nurse, Grab bars around toilet]   Prior level of functioning (ambulation & ADL's)  Prior level of function: Independent with ADLs, Ambulates independently]   Support system-list  (i.e.church, friends, extended family, friends?) strong family support. Daughter at bedside.      Do you want to designate an individual who will care for or assist you upon discharge? Daughter    If yes: Please list the name, relationship, phone number, and address of the designated  individual. Name: Alfredo Batty.   Relationship: daughter  Phone Number: [phone]  Address:       Correct Insurance listed on face sheet - verified with the patient/HDM  [employer].       Discharge Planning Services in Place  Current LACE score? 7   Name of Primary Care Physician verified in patient banner (update in patient banner if not listed) Veneda Melter, MD  [phone]   PCP Follow up apptmt offered/set up Pt to make follow up appointment within 1 week of discharge.    What DME does the patient currently own? (rolling walker, hospital bed, home O2, BiPAP/CPAP, bedside commode, cane, hoyer lift)    ]  DME Currently at Home: Single point cane, Wheelchair-manual (Hemiwalker)]   ]   Are PT/OT services indicated? If so, has it been ordered?  ordered and pt CM - CM rounded with Noreene Larsson OT and Asher Muir PT and home with home health care has been reccommended.    Has the patient been to an Acute Rehab or SNF in the past?  If so, where?   Outpatient only - per stephanie, the patient needed to stop therapy because she ran out of benefits.    Does the patient currently have home health or hospice/palliative services in place?  If so, list agency name.    no    Does the patient already have community dialysis set up?  If so, where? N/A      Readmission Assessment  Is this patient an inpatient to inpatient 30  day readmission?  no   Previous admission discharge diagnosis     Was patient readmitted from a facility?        Patient active with Home Health?        Patient active with Home Hospice?       Contributing factors to readmission (i.e., no follow up appt on previous d/c, unable to get meds, no insurance, no social support, etc.)    Did patient/family understand what medication was for and how to administer, symptoms to indicate worsening condition, activity and diet restrictions at time of previous d/c?               Anticipated Discharge Plan  Anticipated Disposition: Option A  Home with home health care      Home health care liaisons were made aware that the patient apparently ran out of outpt benefits in September, and the family is unsure of the renewal period. It was requested that the HHL update the family they get the determination of the outcome w/ benefits.     Anticipated Disposition: Option B  home with no needs if she does not qualify for outpt therapy.    Who will transport the patient upon discharge? Daughter.    If applicable, were SNF or Hospice choices provided? N/a at this time.    Palliative Care Consult needed? (if yes, contact attending MD)   n/a      IInpatient Medicare/Medicare HMO Patients Only  Was an initial IMM signed within 24 hours of admission?  (Look in Media Tab, Documents Table or Shadow Chart)  n/a      Case management will continue to follow for discharge needs.      Rutherford Nail, RN, BSN  [facility]   Clinical Case Manager

## 2014-03-18 NOTE — Discharge Instructions (Signed)
Home Health Discharge Information     Your doctor has ordered Skilled Nursing, Physical Therapy and Occupational Therapy in-home service(s) for you while you recuperate at home, to assist you in the transition from hospital to home.      The agency that you or your representative chose to provide the service:    Name of Home Health Agency: [facility] [phone]     If you have not heard from your home health agency within 24-48 hours after discharge please call your agency to arrange a time for your first visit.  For any scheduling concerns or questions related to home health, such as time or date please contact your home health agency at the number listed above.     The above services were set up by:                            Clyda Hurdle                           [facility] Liaison)   Phone      [phone]           [email or web address] Program    Education about New Medications and their Side effects    Dear Katherine [name],    Its been a pleasure taking care of you during your hospitalization here at [facility]. We have initiated a new program to educate our patients and/or their family members or designated personnel about the new medications started by your physicians and their indications along with the possible side effects. Multiple studies have shown that patients started on new medications are often unaware of the names of the medication along with the indications and their side effects which leads to decreased compliance with the medications.    During our conversation today on 03/18/2014  3:43 PM I have explained to you the name of the new medication and the indication along with some possible common side effects. Listed below are some of the new medications started during this hospitalization.     Please call the Nurse if you have any side effects while in hospital.     Please call 911 if you have any life threatening symptoms after you are discharged from the hospital.    Please inform  your Primary care physician for common side effects which are not life threatening after discharge.    Medication Name: Citalopram(Celexa)   This Medication is used for:   Depression    Common Side Effects are:   Nausea   Headache    Tremor   Dry mouth    A note from your nurse:  Call your nurse immediately if you notice itching, hives, swelling or trouble breathing       Thank you for your time.    Prudence Davidson, RN  03/18/2014  3:43 PM  [facility]  [address]  [place], [place]  [address]      Discharge Instructions for Stroke  You have been diagnosed with stroke. During a stroke, blood stops flowing to part of your brain. This can damage areas in the brain that control other parts of the body. Symptoms after a stroke depend on which part of the brain has been affected.  Stroke risk factors  Once you've had a stroke, you're at greater risk for another one. Listed below are some other factors that can increase  your risk for another stroke:  High blood pressure  High cholesterol  Cigarette or cigar smoking  Diabetes  Carotid or other artery disease  Atrial fibrillation, atrial flutter,or other heart disease  Physical inactivity  Obesity  Certain blood disorders (such as sickle cell anemia)  Excessive alcohol use  Abuse of illegal drugs  Race  Gender  Family history of stroke  Diet high in salty, fried, or greasy foods  Changes in daily living  Doingyour regular tasks may be difficult after you've had a stroke, but you can learn new ways to manage your daily activities. In fact, doing daily activities may help you to regain muscle strength and bring back function to affected limbs. Be patient, give yourself time to adjust, and appreciate the progress you make.  Daily activities    You may be at risk of falling. Make changes to your home to help you walk more easily. A therapist will decide if you need an assistive device to walk safely.  You may need to see an occupational or physical  therapist to learn new ways of doing things. For example, you may need to make adjustments when bathing or dressing:  Try the following tips for showering or bathing:  Test the water temperature with a hand or foot that was not affected by the stroke.  Use grab bars, a shower seat, a hand-held showerhead, and a long-handled brush.  Try the following tips for dressing:  Dress while sitting, starting with the affected side or limb.  Wear shirts that pull easily over your head and pants or skirts with elastic waistbands.  Use zippers with loops attached to the pull tabs.  Lifestyle changes  Take your medications exactly as directed. Don't skip doses.  Your health care provider will give you information on dietary changes that you may need to make, based on your situation. Your provider may recommend that you see a registered dietitian for help with diet changes. Changes may include:  Reducing fat and cholesterol intake  Reducing sodium (salt) intake, especially if you have high blood pressure  Increasing your intake of fresh vegetables and fruits  Eating lean proteins, such as fish, poultry, and legumes (beans and peas) and eating less red meat and processed meats  Using low-fat dairy products  Using vegetable and nut oils in limited amounts  Limiting sweets and processed foods such as chips, cookies, and baked goods.  Begin an exercise program. Ask your doctor how to get started and how much activity you should try to get on a daily or weekly basis. You can benefit from simple activities such as walking or gardening.  Limit alcohol intake. Men should haveno more than 2 alcoholic drinks a day. Women should limit themselves to 1 alcoholic drink per day.  Know your cholesterol level. Follow your doctor's recommendations about how to keep cholesterol under control.  If you are a smoker, it is time to quit now. Enroll in a stop-smoking program to improve your chances of success. Ask your doctor about medications or other  methods to help you quit.  Learn stress management techniques to help you deal with stress in your home and work life.  Follow-up care  Keep your medical appointments. Close follow-up is important to stroke rehabilitation and recovery.  Some medications require blood tests to check for progress or problems. Keep follow-up appointments for any blood tests ordered by your doctors.     2000-2015 [employer] [address], [place], [place]  [address]. All rights reserved. This information is not intended as a substitute for professional medical care. Always follow your healthcare professional's instructions.

## 2014-03-18 NOTE — Consults (Signed)
Service Date: 03/18/2014     Patient Type: V     CONSULTING PHYSICIAN: Everlean Patterson MD     REFERRING PHYSICIAN:      CONSULTING SERVICE:  Neurology     HISTORY OF PRESENT ILLNESS:  The patient is about a 63 year old I was asked to see in consultation for  neurological evaluation and cerebrovascular event, stroke.  The patient had  history of previous cerebrovascular events.  She presented with dizziness  on awakening.  She stated she was lightheaded.  She has been also reporting  some unsteadiness on her feet when she tried to walk.  She lost her balance  and fell to the ground and hit her head.  No loss of consciousness, no  convulsions, seizure, no incontinence, slobbering or foaming.  She stated  she managed to get up and try to go upstairs to call her son in law but he  was not at home.  She was sitting on the stairs and later son in law came  back home and brought her to the hospital.  She stated that she was having  some headache.  She was also having some nausea and vomiting earlier.  She  did not notice any new weakness.  She stated that maybe the left side was  weaker compared to her baseline yesterday.  She stated that today she feels  like her left side is back to her baseline.  She denies any chest pain.   She was apparently in the process of changing from Xarelto to Coumadin as  the insurance was not covering the medication.  Her INRs have been low,  despite the Coumadin and the cardiologist was planning to talk to the  insurance about approving Xarelto for her.  She was apparently noticed to  have some  in the previous workup.     REVIEW OF SYSTEMS:  She denies any agitation or hallucination, denies any significant  depression, no chest pain.  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  No fever, chills, weight loss, weight gain.      PHYSICAL EXAMINATION:  VITAL SIGNS:  Temperature was 97 centigrade.  Blood pressure was 141/65,  pulse was 85 and respiratory rate 16 and saturation 99; otherwise without  distress.     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     PAST SURGICAL HISTORY:  Cholecystectomy, hysterectomy, oophorectomy, carpal tunnel surgery, and  cataract surgery.     FAMILY HISTORY:  Mother with diabetes, father with chronic obstructive pulmonary disease,  grandfather with stroke.     SOCIAL HISTORY:  No drug or alcohol abuse, no smoking, she is married.     ALLERGY:  Probably to PENICILLIN that caused some rash.     MEDICATIONS:  Prior to admission include Lipitor 80 mg, baclofen 10 mg, Cipro, ophthalmic  solution, dextromethorphan, Levemir 100 units, Humalog 100 units, Mirapex  0.25 mg, Xarelto 20 mg, warfarin 4 mg.     LABORATORY DATA:  Showed PT of 14.8, INR 1.2, PTT 25.     White cell count was 10.9, hemoglobin 13, hematocrit 40, platelet 208.   Sodium 142, potassium 5.5, BUN 28, creatinine 1.2, glucose 448, calcium  9.9, AST 20, ALT 12, alkaline phosphatase 134.     I reviewed MRI scan  that was done today, reported infarct involving the MCA  territory on the right chronic appearing, occlusion of the right MCA  artery.  Only trace flow related enhancement evident in the midcervical  aspect of the left vertebral artery, which could indicate occlusion or slow  flow in the markedly hypoplastic vessel.  Also atherosclerosis of the  carotid bifurcation but no high grade hemodynamically significant carotid  disease.     ASSESSMENT AND PLAN:  About a 63 year old very pleasant lady with history of previous  cerebrovascular event presented with some dizzy feeling without ataxia.   She stated yesterday she went weaker in the left compared to her baseline,  history of PFO, diabetes, multiple risk factors for stroke.  At this time  of my evaluation the  patient in my opinion, seemed to be at or close to her  baseline neurological status.  Cardiology following in regard to  anticoagulation; otherwise from neurological standpoint, she does appear  stable at this time.     Thank you very much for letting me participate in the care of this patient  and for this consultation.           D:  03/18/2014 15:22 PM by Dr. Brantley Stage. Marney Doctor, MD ([id number])  T:  03/18/2014 17:39 PM by       Everlean Cherry: [id number]) (Doc ID: [id number])

## 2014-03-18 NOTE — Progress Notes (Signed)
Home Health Referral          Referral from Rutherford Nail (Case Manager) for home health care upon discharge.    By [employer], the patient has the right to freely choose a home care provider.  Arrangements have been made with:     A company of the patients choosing. We have supplied the patient with a listing of providers in your area who asked to be included and participate in Medicare.   [facility], a home care agency that provides both adult home care services which is a wholly owned and operated by [employer] and participates in [employer]   The preferred provider of your insurance company. Choosing a home care provider other than your insurance company's preferred provider may affect your insurance coverage.    The Home Health Care Referral Form acknowledging the voluntary selection of the home care company has been completed, signed, and is on file.      Home Health Discharge Information     Your doctor has ordered Skilled Nursing, Physical Therapy and Occupational Therapy in-home service(s) for you while you recuperate at home, to assist you in the transition from hospital to home.      The agency that you or your representative chose to provide the service:  Name of Home Health Agency: [facility] [phone]     The above services were set up by:                            Clyda Hurdle                           [facility] Liaison)   Phone      [phone]                                       Additional comments:       Signed by: Clyda Hurdle  Date Time: 03/18/2014 1:47 PM

## 2014-03-29 NOTE — Discharge Summary (Signed)
Reed Pandy HOSPITALIST   [facility] Summary     Patient Info:   Date Time: 03/29/2014  7:19 AM   Patient Name:Britney A Marcelle Overlie   JXB:[id number]    PCP: Veneda Melter, MD   Admit Date:03/17/2014   Attending Physician:No att. providers found      Hospital Course:   Please see H&P for complete details of HPI and ROS. The patient was admitted to [facility] and has been diagnosed with the following conditions and has been taken care as mentioned below.    Patient came with the chief complaint of dizziness . patient woke up today morning and felt very dizzy and lightheaded . she has been unsteady on her feet and when she tried to walk. She lost her balance and fell to the ground and hit her head . no loss of consciousness . she then tried to manage to get up and the tried to go upstairs to call her son-in-law, but he was not at home . she was sitting on the stairs and later son-in-law came back home, brought her to the hospital . patient has been having headache after the fall     --- Dizziness : Patient has associated nausea and vomiting.  MRI of the brain has been negative for acute stroke.  Discussed Dr. Marney Doctor  about the findings and no acute etiology found.  She was in the process of transitioning from Xarelto to Coumadin because of insurance issues.  Plan at this time is to resume Xarelto and the cardiologist  will have insurance authorization and give samples.  Patient's INR has always been low on Coumadin.  The symptoms of dizziness have also resolved and most likely the symptoms are related to uncontrolled blood sugars.    --- AKI , creatinine was 1.2 on admission, which improved to 0.9 with IV fluids.    --- Uncontrolled diabetes continue with Levemir 25 units twice a day and sliding scale .  Discussed at length about sliding scale and how to use the insulin with the patient and the daughter at the bedside.  Possible non-compliance.  The daughter will be helping the patient with managing her  sugars.    --- Hyperlipidemia, on Lipitor.    --- History of PFO     --- History of CVA with residual left arm weakness . she does manage to walk without any support and does her daily activities .    Patient lives with her daughter      Hospital Problems:  Active Problems:    PFO (patent foramen ovale)    DM (diabetes mellitus)    Dizziness    Non-intractable vomiting with nausea    AKI (acute kidney injury)    Type 2 diabetes mellitus with complication    Non-intractable vomiting with nausea, vomiting of unspecified type     Admission Date:03/17/2014   Discharge Date:03/18/2014    Disposition: home   Condition at Discharge and Prognosis: Stable    Type of Admission: Observation   Medical Necessity for stay: Dizziness.  Acute kidney injury    Code Status: Prior       Clinical Presentation:   History of Presenting Illness: No dizziness, tolerating diet.  No nausea, no vomiting . no chest pain, no headache    Chief Complaint:   Chief Complaint   Patient presents with   . Dizziness      Vitals: Vitals reviewed height is 1.702 m (5' 7.01") and weight is 84.9 kg (187 lb  2.7 oz). Her temporal artery temperature is 98.1 F (36.7 C). Her blood pressure is 139/69 and her pulse is 89. Her respiration is 18 and oxygen saturation is 97%. Body mass index is 29.31 kg/(m^2).  Filed Vitals:    03/18/14 0200 03/18/14 0600 03/18/14 1000 03/18/14 1400   BP: 114/56 140/64 116/53 139/69   Pulse: 82 79 78 89   Temp: 98 F (36.7 C) 97.2 F (36.2 C) 97.6 F (36.4 C) 98.1 F (36.7 C)   TempSrc: Temporal Artery Temporal Artery Temporal Artery Temporal Artery   Resp: 16 16 18 18    Height:       Weight:  84.9 kg (187 lb 2.7 oz)     SpO2: 95% 98% 98% 97%     Intake and Output Summary (Last 24 hours) at Date Time No intake or output data in the 24 hours ending 03/29/14 0719   Physical Exam:   Comfortable, not in acute respiratory distress   Chest bilaterally clear breath sounds   CVS S1, S2 present, regular   Neurologic awake, alert,  oriented 3.  Positive contracture of the left arm with 1 out of 5 strength            Discharge Diagnosis and Instructions:   Pending Labs:  Unresulted Labs     None         Consultants:Plan IP CONSULT TO CARDIOLOGY  [facility] FACE-TO-FACE (FTF) ENCOUNTER   Discharge Medications:     Discharge Medication List      Taking          atorvastatin 80 MG tablet   Dose:  80 mg   Commonly known as:  LIPITOR   Take 80 mg by mouth daily.       baclofen 10 MG tablet   Dose:  10 mg   Commonly known as:  LIORESAL   For:  Muscle Spasticity   Take 10 mg by mouth 3 (three) times daily.       ciprofloxacin 0.3 % ophthalmic solution   Dose:  1 drop   Commonly known as:  CILOXAN   Place 1 drop into the left eye 4 (four) times daily.       citalopram 10 MG tablet   Dose:  10 mg   Commonly known as:  CeleXA   Take 1 tablet (10 mg total) by mouth daily.       insulin detemir 100 UNIT/ML injection   Dose:  25 Units   Commonly known as:  LEVEMIR   Inject 25 Units into the skin every 12 (twelve) hours.       insulin lispro 100 UNIT/ML injection   Dose:  1 Units   What changed:  how much to take   Commonly known as:  HUMALOG   Inject 1 Units into the skin See Admin Instructions. Sliding Scale 3 times a day with meals       INSULIN SYRINGE 1CC/29G 29G X 1/2" 1 ML Misc   Use as directed       pramipexole 0.25 MG tablet   Dose:  0.5 mg   Commonly known as:  MIRAPEX   - Take 0.5 mg by mouth nightly.     -        rivaroxaban 20 MG Tabs   Dose:  20 mg   Commonly known as:  XARELTO   Take 20 mg by mouth daily with dinner.         STOP taking these medications  Dextromethorphan-Quinidine 20-10 MG Caps       warfarin 4 MG tablet   Commonly known as:  COUMADIN            Labs/Images to be followed at your PCP office: Blood sugars    Hospital Problems:Active Problems:    PFO (patent foramen ovale)    DM (diabetes mellitus)    Dizziness    Non-intractable vomiting with nausea    AKI (acute kidney injury)    Type 2 diabetes mellitus with  complication    Non-intractable vomiting with nausea, vomiting of unspecified type     Lists the present on admission hospital problems:Present on Admission:   . (Resolved) TIA (transient ischemic attack)  . Dizziness  . Non-intractable vomiting with nausea  . DM (diabetes mellitus)  . AKI (acute kidney injury)   Follow up:         Follow-up Information     Follow up with Veneda Melter, MD In 1 week.    Specialty:  Family Medicine    Contact information:    [address]  [address]  [place] [address]  [phone]          Follow up with Coralyn Pear, MD In 2 weeks.    Specialty:  Cardiology    Contact information:    [address]  [address]  [place] [address]  [phone]                Results of Labs/imaging:   Labs have been reviewed:   Coagulation Profile: No results for input(s): PT, INR, PTT, APTT in the last 168 hours.     CBC review: No results for input(s): WBC, HGB, HCT, PLT, MCV, RDW, NEUTRO, LYMPHOCYTESA, EOSINOPHILSA, IMMATUREGRAN, NEUTROABS, ABSOLUTEIMMA in the last 168 hours.    Invalid input(s):  NEUTRABS, LYMPHSABS, BANDSABD, BASOPHILSAUT   Chem Review:No results for input(s): NA, K, CL, CO2, BUN, CREAT, GLU, CA, MG, PHOS, BILITOTAL, AST, ALT, ALKPHOS in the last 168 hours.   Results     Procedure Component Value Units Date/Time    Hemoglobin A1c [[id number]]  (Abnormal) Collected:  03/18/14 0712    Specimen Information:  Blood Updated:  03/18/14 1435     Hemoglobin A1C 9.4 (H) %     Lipid panel (Fasting) [[id number]] Collected:  03/18/14 0712    Specimen Information:  Blood Updated:  03/18/14 1419     Cholesterol 125 mg/dL      Triglycerides 94 mg/dL      HDL 41 mg/dL      LDL Calculated 65 mg/dL      VLDL Cholesterol Cal 19 mg/dL      CHOL/HDL Ratio 3.0     Hemolysis index [[id number]] Collected:  03/18/14 0712     Hemolysis Index 3 Updated:  03/18/14 1419    Glucose Whole Blood - POCT [[id number]]  (Abnormal) Collected:  03/18/14 1213     POCT - Glucose Whole blood 302 (H) mg/dL  Updated:  59/56/38 7564    Basic Metabolic Panel [[id number]]  (Abnormal) Collected:  03/18/14 0712    Specimen Information:  Blood Updated:  03/18/14 0847     Glucose 351 (H) mg/dL      BUN 16.6 (H) mg/dL      Creatinine 0.9 mg/dL      CALCIUM 8.9 mg/dL      Sodium 063 mEq/L      Potassium 3.8 mEq/L      Chloride 106 mEq/L  CO2 25 mEq/L      Anion Gap 8.0     GFR [[id number]] Collected:  03/18/14 0712     EGFR >60.0 Updated:  03/18/14 0847    Glucose Whole Blood - POCT [[id number]]  (Abnormal) Collected:  03/18/14 0756     POCT - Glucose Whole blood 325 (H) mg/dL Updated:  78/29/56 2130    Glucose Whole Blood - POCT [[id number]]  (Abnormal) Collected:  03/17/14 2135     POCT - Glucose Whole blood 405 (H) mg/dL Updated:  29/52/84 1324         Radiology reports have been reviewed:  Radiology Results (24 Hour)     Procedure Component Value Units Date/Time    MRI Brain WO Contrast [[id number]] Collected:  03/18/14 6644    Order Status:  Completed Updated:  03/18/14 0954    Narrative:      HISTORY: Dizziness, prior CVA    TECHNIQUE: Multiplanar, multisequence MR imaging of the brain was  performed without intravenous contrast. MRA imaging of the cervical and  intracranial vasculature was also performed.    COMPARISON: Prior head CT dated 03/17/2014    FINDINGS: There is encephalomalacia involving the MCA territory on the  right, predominantly involving the basal ganglia and right  parietotemporal region, compatible with a chronic infarct. There is  associated Wallerian degeneration. There is no restricted diffusion to  suggest an acute infarct. There is no mass effect or midline shift.    MRA imaging of the intracranial vasculature is slightly degraded by  patient motion. The internal carotid arteries are patent. There is  occlusion of the right M1 segment, with some small collateral vessels in  this region and marked attenuation of the MCA branches on the right. The  anterior cerebral arteries and left middle cerebral  artery are patent.  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 The basilar artery is grossly normal  in caliber and gives rise to the posterior cerebral arteries  bilaterally. There are luminal irregularities of the distal aspect of  the right posterior cerebral artery, which remains patent.    MRA imaging of the cervical vasculature demonstrates almost complete  lack of flow-related enhancement involving the left vertebral artery.  Subtle thready signal is identified in the mid cervical left vertebral  artery. The findings suggest occlusion or slow flow in what is likely a  markedly hypoplastic left vertebral artery. The right vertebral artery  is large in caliber throughout its course. There is evidence of  atherosclerotic disease at the carotid bifurcations, with mild narrowing  of the distal common carotid arteries. No high-grade carotid stenosis is  identified. The examination is slightly degraded by patient motion.      Impression:         1. No acute infarct, intracranial hemorrhage, or mass lesion.  2. Remote infarcts involving the MCA territory on the right.  3. 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  There are luminal irregularities/stenoses of the distal right posterior  cerebral artery, which remains patent.  4. Only trace flow related enhancement evident in the mid cervical  aspect of the left vertebral artery, which could indicate occlusion or  slow flow in a markedly hypoplastic vessel. This finding is of uncertain  chronicity.  5. There is atherosclerotic disease at the carotid bifurcations, but no  high-grade carotid stenosis is identified.    Nicoletta Dress, MD   03/18/2014 9:50 AM      MRA Head WO Contrast [[id number]] Collected:  03/18/14 0828    Order Status:  Completed Updated:  03/18/14 0954    Narrative:      HISTORY: Dizziness, prior CVA    TECHNIQUE: Multiplanar, multisequence MR imaging of the brain was  performed without intravenous contrast. MRA imaging of the cervical  and  intracranial vasculature was also performed.    COMPARISON: Prior head CT dated 03/17/2014    FINDINGS: There is encephalomalacia involving the MCA territory on the  right, predominantly involving the basal ganglia and right  parietotemporal region, compatible with a chronic infarct. There is  associated Wallerian degeneration. There is no restricted diffusion to  suggest an acute infarct. There is no mass effect or midline shift.    MRA imaging of the intracranial vasculature is slightly degraded by  patient motion. The internal carotid arteries are patent. There is  occlusion of the right M1 segment, with some small collateral vessels in  this region and marked attenuation of the MCA branches on the right. The  anterior cerebral arteries and left middle cerebral artery are patent.  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 The basilar artery is grossly normal  in caliber and gives rise to the posterior cerebral arteries  bilaterally. There are luminal irregularities of the distal aspect of  the right posterior cerebral artery, which remains patent.    MRA imaging of the cervical vasculature demonstrates almost complete  lack of flow-related enhancement involving the left vertebral artery.  Subtle thready signal is identified in the mid cervical left vertebral  artery. The findings suggest occlusion or slow flow in what is likely a  markedly hypoplastic left vertebral artery. The right vertebral artery  is large in caliber throughout its course. There is evidence of  atherosclerotic disease at the carotid bifurcations, with mild narrowing  of the distal common carotid arteries. No high-grade carotid stenosis is  identified. The examination is slightly degraded by patient motion.      Impression:         1. No acute infarct, intracranial hemorrhage, or mass lesion.  2. Remote infarcts involving the MCA territory on the right.  3. 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  There are luminal  irregularities/stenoses of the distal right posterior  cerebral artery, which remains patent.  4. Only trace flow related enhancement evident in the mid cervical  aspect of the left vertebral artery, which could indicate occlusion or  slow flow in a markedly hypoplastic vessel. This finding is of uncertain  chronicity.  5. There is atherosclerotic disease at the carotid bifurcations, but no  high-grade carotid stenosis is identified.    Nicoletta Dress, MD   03/18/2014 9:50 AM      MRA Neck WO Contrast [[id number]] Collected:  03/18/14 0828    Order Status:  Completed Updated:  03/18/14 0954    Narrative:      HISTORY: Dizziness, prior CVA    TECHNIQUE: Multiplanar, multisequence MR imaging of the brain was  performed without intravenous contrast. MRA imaging of the cervical and  intracranial vasculature was also performed.    COMPARISON: Prior head CT dated 03/17/2014    FINDINGS: There is encephalomalacia involving the MCA territory on the  right, predominantly involving the basal ganglia and right  parietotemporal region, compatible with a chronic infarct. There is  associated Wallerian degeneration. There is no restricted diffusion to  suggest an acute infarct. There is no mass effect or midline shift.    MRA imaging of the intracranial vasculature is  slightly degraded by  patient motion. The internal carotid arteries are patent. There is  occlusion of the right M1 segment, with some small collateral vessels in  this region and marked attenuation of the MCA branches on the right. The  anterior cerebral arteries and left middle cerebral artery are patent.  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 The basilar artery is grossly normal  in caliber and gives rise to the posterior cerebral arteries  bilaterally. There are luminal irregularities of the distal aspect of  the right posterior cerebral artery, which remains patent.    MRA imaging of the cervical vasculature demonstrates almost complete  lack of flow-related  enhancement involving the left vertebral artery.  Subtle thready signal is identified in the mid cervical left vertebral  artery. The findings suggest occlusion or slow flow in what is likely a  markedly hypoplastic left vertebral artery. The right vertebral artery  is large in caliber throughout its course. There is evidence of  atherosclerotic disease at the carotid bifurcations, with mild narrowing  of the distal common carotid arteries. No high-grade carotid stenosis is  identified. The examination is slightly degraded by patient motion.      Impression:         1. No acute infarct, intracranial hemorrhage, or mass lesion.  2. Remote infarcts involving the MCA territory on the right.  3. 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  There are luminal irregularities/stenoses of the distal right posterior  cerebral artery, which remains patent.  4. Only trace flow related enhancement evident in the mid cervical  aspect of the left vertebral artery, which could indicate occlusion or  slow flow in a markedly hypoplastic vessel. This finding is of uncertain  chronicity.  5. There is atherosclerotic disease at the carotid bifurcations, but no  high-grade carotid stenosis is identified.    Nicoletta Dress, MD   03/18/2014 9:50 AM          Ct Head Wo Contrast    03/17/2014   HISTORY: Vertigo and speech difficulty. Stroke protocol.  COMPARISON: 03/28/2013  TECHNIQUE: Noncontrast CT imaging of the head was performed.  FINDINGS: There are patchy areas of hypoattenuation throughout the right cerebral hemisphere which have the appearance of chronic infarctions. There is ex vacuo dilatation of the right lateral ventricle. No obviously new infarct is seen but if clinical symptoms persist, MR imaging is available. There is no hemorrhage or shift. The basilar cisterns are maintained. There is atherosclerosis.     03/17/2014    1. No acute intracranial abnormality is detected. There are chronic appearing  ischemic changes.  These urgent results were discussed with and acknowledged by the emergency room physician caring for this patient, at about 1407 hours on 03/15/2014.  Otho Ket, MD  03/17/2014 2:08 PM     Mra Head Wo Contrast    03/18/2014   HISTORY: Dizziness, prior CVA  TECHNIQUE: Multiplanar, multisequence MR imaging of the brain was performed without intravenous contrast. MRA imaging of the cervical and intracranial vasculature was also performed.  COMPARISON: Prior head CT dated 03/17/2014  FINDINGS: There is encephalomalacia involving the MCA territory on the right, predominantly involving the basal ganglia and right parietotemporal region, compatible with a chronic infarct. There is associated Wallerian degeneration. There is no restricted diffusion to suggest an acute infarct. There is no mass effect or midline shift.  MRA imaging of the intracranial vasculature is slightly degraded by patient motion. The internal carotid arteries are patent.  There is occlusion of the right M1 segment, with some small collateral vessels in this region and marked attenuation of the MCA branches on the right. The anterior cerebral arteries and left middle cerebral artery are patent. 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 The basilar artery is grossly normal in caliber and gives rise to the posterior cerebral arteries bilaterally. There are luminal irregularities of the distal aspect of the right posterior cerebral artery, which remains patent.  MRA imaging of the cervical vasculature demonstrates almost complete lack of flow-related enhancement involving the left vertebral artery. Subtle thready signal is identified in the mid cervical left vertebral artery. The findings suggest occlusion or slow flow in what is likely a markedly hypoplastic left vertebral artery. The right vertebral artery is large in caliber throughout its course. There is evidence of atherosclerotic disease at the carotid bifurcations, with mild  narrowing of the distal common carotid arteries. No high-grade carotid stenosis is identified. The examination is slightly degraded by patient motion.     03/18/2014     1. No acute infarct, intracranial hemorrhage, or mass lesion. 2. Remote infarcts involving the MCA territory on the right. 3. 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 There are luminal irregularities/stenoses of the distal right posterior cerebral artery, which remains patent. 4. Only trace flow related enhancement evident in the mid cervical aspect of the left vertebral artery, which could indicate occlusion or slow flow in a markedly hypoplastic vessel. This finding is of uncertain chronicity. 5. There is atherosclerotic disease at the carotid bifurcations, but no high-grade carotid stenosis is identified.  Nicoletta Dress, MD  03/18/2014 9:50 AM     Mra Neck Wo Contrast    03/18/2014   HISTORY: Dizziness, prior CVA  TECHNIQUE: Multiplanar, multisequence MR imaging of the brain was performed without intravenous contrast. MRA imaging of the cervical and intracranial vasculature was also performed.  COMPARISON: Prior head CT dated 03/17/2014  FINDINGS: There is encephalomalacia involving the MCA territory on the right, predominantly involving the basal ganglia and right parietotemporal region, compatible with a chronic infarct. There is associated Wallerian degeneration. There is no restricted diffusion to suggest an acute infarct. There is no mass effect or midline shift.  MRA imaging of the intracranial vasculature is slightly degraded by patient motion. The internal carotid arteries are patent. There is occlusion of the right M1 segment, with some small collateral vessels in this region and marked attenuation of the MCA branches on the right. The anterior cerebral arteries and left middle cerebral artery are patent. 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 The basilar artery is grossly normal in caliber and gives rise to the  posterior cerebral arteries bilaterally. There are luminal irregularities of the distal aspect of the right posterior cerebral artery, which remains patent.  MRA imaging of the cervical vasculature demonstrates almost complete lack of flow-related enhancement involving the left vertebral artery. Subtle thready signal is identified in the mid cervical left vertebral artery. The findings suggest occlusion or slow flow in what is likely a markedly hypoplastic left vertebral artery. The right vertebral artery is large in caliber throughout its course. There is evidence of atherosclerotic disease at the carotid bifurcations, with mild narrowing of the distal common carotid arteries. No high-grade carotid stenosis is identified. The examination is slightly degraded by patient motion.     03/18/2014     1. No acute infarct, intracranial hemorrhage, or mass lesion. 2. Remote infarcts involving the MCA territory on the right. 3. Chronic  appearing occlusion of the right middle cerebral artery. There are luminal irregularities/stenoses of the distal right posterior cerebral artery, which remains patent. 4. Only trace flow related enhancement evident in the mid cervical aspect of the left vertebral artery, which could indicate occlusion or slow flow in a markedly hypoplastic vessel. This finding is of uncertain chronicity. 5. There is atherosclerotic disease at the carotid bifurcations, but no high-grade carotid stenosis is identified.  Nicoletta Dress, MD  03/18/2014 9:50 AM     Mri Brain Wo Contrast    03/18/2014   HISTORY: Dizziness, prior CVA  TECHNIQUE: Multiplanar, multisequence MR imaging of the brain was performed without intravenous contrast. MRA imaging of the cervical and intracranial vasculature was also performed.  COMPARISON: Prior head CT dated 03/17/2014  FINDINGS: There is encephalomalacia involving the MCA territory on the right, predominantly involving the basal ganglia and right parietotemporal region,  compatible with a chronic infarct. There is associated Wallerian degeneration. There is no restricted diffusion to suggest an acute infarct. There is no mass effect or midline shift.  MRA imaging of the intracranial vasculature is slightly degraded by patient motion. The internal carotid arteries are patent. There is occlusion of the right M1 segment, with some small collateral vessels in this region and marked attenuation of the MCA branches on the right. The anterior cerebral arteries and left middle cerebral artery are patent. 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 The basilar artery is grossly normal in caliber and gives rise to the posterior cerebral arteries bilaterally. There are luminal irregularities of the distal aspect of the right posterior cerebral artery, which remains patent.  MRA imaging of the cervical vasculature demonstrates almost complete lack of flow-related enhancement involving the left vertebral artery. Subtle thready signal is identified in the mid cervical left vertebral artery. The findings suggest occlusion or slow flow in what is likely a markedly hypoplastic left vertebral artery. The right vertebral artery is large in caliber throughout its course. There is evidence of atherosclerotic disease at the carotid bifurcations, with mild narrowing of the distal common carotid arteries. No high-grade carotid stenosis is identified. The examination is slightly degraded by patient motion.     03/18/2014     1. No acute infarct, intracranial hemorrhage, or mass lesion. 2. Remote infarcts involving the MCA territory on the right. 3. 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 There are luminal irregularities/stenoses of the distal right posterior cerebral artery, which remains patent. 4. Only trace flow related enhancement evident in the mid cervical aspect of the left vertebral artery, which could indicate occlusion or slow flow in a markedly hypoplastic vessel. This finding is  of uncertain chronicity. 5. There is atherosclerotic disease at the carotid bifurcations, but no high-grade carotid stenosis is identified.  Nicoletta Dress, MD  03/18/2014 9:50 AM     Chest Ap Portable    03/17/2014   HISTORY: Stroke evaluation.  COMPARISON: Chest x-ray from 04/24/2013.  EXAMINATION: Chest x-ray AP portable single view performed on 03/17/2014.  FINDINGS:  The cardiac silhouette is normal in size. The pulmonary vascularity is within normal limits in appearance. No focal airspace opacities, pneumothorax or pleural effusion. No acute bony findings.      03/17/2014    No acute cardiopulmonary findings.  Quinn Plowman, MD  03/17/2014 2:21 PM      Pathology:   Specimens     None             Hospitalist:   Signed by:  Merwin Breden V   03/29/2014 7:19 AM   Time spent for discharge: 30 minutes

## 2014-04-01 ENCOUNTER — Ambulatory Visit: Payer: Self-pay | Attending: Neurology

## 2014-04-01 DIAGNOSIS — R269 Unspecified abnormalities of gait and mobility: Secondary | ICD-10-CM | POA: Insufficient documentation

## 2014-05-02 ENCOUNTER — Ambulatory Visit: Payer: Self-pay | Attending: Neurology

## 2014-05-02 ENCOUNTER — Other Ambulatory Visit (INDEPENDENT_AMBULATORY_CARE_PROVIDER_SITE_OTHER): Payer: Self-pay | Admitting: Internal Medicine

## 2014-05-02 DIAGNOSIS — R269 Unspecified abnormalities of gait and mobility: Secondary | ICD-10-CM | POA: Insufficient documentation

## 2014-05-31 ENCOUNTER — Ambulatory Visit: Payer: Self-pay

## 2014-07-01 ENCOUNTER — Ambulatory Visit: Payer: Self-pay

## 2014-07-31 ENCOUNTER — Ambulatory Visit: Payer: Self-pay

## 2014-08-31 ENCOUNTER — Ambulatory Visit: Payer: Self-pay

## 2014-10-25 ENCOUNTER — Emergency Department: Admit: 2014-10-25 | Payer: BLUE CROSS/BLUE SHIELD | Primary: Family Medicine

## 2014-10-25 ENCOUNTER — Inpatient Hospital Stay
Admit: 2014-10-25 | Discharge: 2014-10-25 | Disposition: A | Discharge status: Home / Self Care | Payer: BLUE CROSS/BLUE SHIELD | Attending: Emergency Medicine

## 2014-10-25 DIAGNOSIS — R42 Dizziness and giddiness: Secondary | ICD-10-CM

## 2014-10-25 LAB — CARDIAC PANEL,(CK, CKMB & TROPONIN)
CK - MB: 3.6 ng/ml (ref 0.5–3.6)
CK-MB Index: 2.1 % (ref 0.0–4.0)
CK: 170 U/L (ref 26–192)
Troponin-I, QT: <0.02 NG/ML (ref 0.0–0.045)

## 2014-10-25 LAB — URINE MICROSCOPIC ONLY
RBC: 4 /hpf (ref 0–5)
WBC: 0 /hpf (ref 0–4)

## 2014-10-25 LAB — METABOLIC PANEL, COMPREHENSIVE
A-G Ratio: 1.2 (ref 0.8–1.7)
ALT (SGPT): 21 U/L (ref 13–56)
AST (SGOT): 15 U/L (ref 15–37)
Albumin: 3.7 g/dL (ref 3.4–5.0)
Alk. phosphatase: 119 U/L — ABNORMAL HIGH (ref 45–117)
Anion gap: 14 mmol/L (ref 3.0–18)
BUN/Creatinine ratio: 31 — ABNORMAL HIGH (ref 12–20)
BUN: 28 MG/DL — ABNORMAL HIGH (ref 7.0–18)
Bilirubin, total: 0.6 MG/DL (ref 0.2–1.0)
CO2: 23 mmol/L (ref 21–32)
Calcium: 8.9 MG/DL (ref 8.5–10.1)
Chloride: 105 mmol/L (ref 100–108)
Creatinine: 0.91 MG/DL (ref 0.6–1.3)
GFR est AA: >60 mL/min/{1.73_m2} (ref >60)
GFR est non-AA: >60 mL/min/{1.73_m2} (ref >60)
Globulin: 3.1 g/dL (ref 2.0–4.0)
Glucose: 379 mg/dL — ABNORMAL HIGH (ref 74–99)
Potassium: 4.6 mmol/L (ref 3.5–5.5)
Protein, total: 6.8 g/dL (ref 6.4–8.2)
Sodium: 142 mmol/L (ref 136–145)

## 2014-10-25 LAB — CBC WITH AUTOMATED DIFF
ABS. BASOPHILS: 0 10*3/uL (ref 0.0–0.06)
ABS. EOSINOPHILS: 0.1 10*3/uL (ref 0.0–0.4)
ABS. LYMPHOCYTES: 1.1 10*3/uL (ref 0.9–3.6)
ABS. MONOCYTES: 0.7 10*3/uL (ref 0.05–1.2)
ABS. NEUTROPHILS: 8.2 10*3/uL — ABNORMAL HIGH (ref 1.8–8.0)
BASOPHILS: 0 % (ref 0–2)
EOSINOPHILS: 1 % (ref 0–5)
HCT: 35.4 % (ref 35.0–45.0)
HGB: 11.7 g/dL — ABNORMAL LOW (ref 12.0–16.0)
LYMPHOCYTES: 11 % — ABNORMAL LOW (ref 21–52)
MCH: 29.5 PG (ref 24.0–34.0)
MCHC: 33.1 g/dL (ref 31.0–37.0)
MCV: 89.2 FL (ref 74.0–97.0)
MONOCYTES: 7 % (ref 3–10)
MPV: 10.7 FL (ref 9.2–11.8)
NEUTROPHILS: 81 % — ABNORMAL HIGH (ref 40–73)
PLATELET: 210 10*3/uL (ref 135–420)
RBC: 3.97 M/uL — ABNORMAL LOW (ref 4.20–5.30)
RDW: 13.5 % (ref 11.6–14.5)
WBC: 10.1 10*3/uL (ref 4.6–13.2)

## 2014-10-25 LAB — PROTHROMBIN TIME + INR
INR: 1.3 — ABNORMAL HIGH (ref 0.8–1.2)
Prothrombin time: 15.4 s — ABNORMAL HIGH (ref 11.5–15.2)

## 2014-10-25 LAB — URINALYSIS W/ RFLX MICROSCOPIC
Bilirubin: NEGATIVE
Glucose: >1000 mg/dL — AB
Ketone: 15 mg/dL — AB
Nitrites: NEGATIVE
Protein: NEGATIVE mg/dL
Specific gravity: >1.03 — ABNORMAL HIGH (ref 1.005–1.030)
Urobilinogen: 0.2 EU/dL (ref 0.2–1.0)
pH (UA): 5 (ref 5.0–8.0)

## 2014-10-25 LAB — GLUCOSE, POC: Glucose (POC): 258 mg/dL — ABNORMAL HIGH (ref 70–110)

## 2014-10-25 LAB — PTT: aPTT: 36.2 s (ref 23.0–36.4)

## 2014-10-25 MED ORDER — INSULIN REGULAR HUMAN 100 UNIT/ML INJECTION
100 unit/mL | INTRAMUSCULAR | Status: AC
Start: 2014-10-25 — End: 2014-10-25
  Administered 2014-10-25: 19:00:00 via INTRAVENOUS

## 2014-10-25 MED FILL — INSULIN REGULAR HUMAN 100 UNIT/ML INJECTION: 100 unit/mL | INTRAMUSCULAR | Qty: 1

## 2014-10-25 NOTE — ED Provider Notes (Signed)
HPI Comments: 11:48 AM Ann Jacobson is a 64 y.o. female with a history of diabetes, embolism, stroke, and heart murmur who presents to the emergency department via EMS transport c/o dizziness onset 3.5 hours ago. The patient explains she began to feel dizzy prior to falling when she got out of bed to use the restroom. Patient reports she fell a second time after feeling dizzy when she was getting out of the bathtub. Patient denies loss of consciousness, head injury, chest pain, shortness of breath, cough, abdominal pain, nausea, vomiting, diarrhea, and urinary symptoms. No other concerns at this time.       PCP: No primary care provider on file.          The history is provided by the nursing home.        Past Medical History:   Diagnosis Date   ??? CVA (cerebral vascular accident) ([place])    ??? Diabetes ([place])    ??? 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 Hx cataract removal           History reviewed. No pertinent family history.    History     Social History   ??? Marital Status: SINGLE     Spouse Name: N/A   ??? Number of Children: N/A   ??? Years of Education: N/A     Occupational History   ??? Not on file.     Social History Main Topics   ??? Smoking status: Never Smoker    ??? Smokeless tobacco: Not on file   ??? Alcohol Use: No   ??? Drug Use: Not on file   ??? Sexual Activity: Not on file     Other Topics Concern   ??? Not on file     Social History Narrative   ??? No narrative on file         ALLERGIES: Pcn    Review of Systems   Constitutional: Negative for fever, chills and fatigue.   HENT: Negative for congestion, rhinorrhea and sore throat.    Respiratory: Negative for cough and shortness of breath.    Cardiovascular: Negative for chest pain and palpitations.   Gastrointestinal: Negative for nausea, vomiting, abdominal pain and diarrhea.   Genitourinary: Negative for dysuria, urgency and hematuria.   Musculoskeletal: Negative for myalgias and arthralgias.    Skin: Negative for rash and wound.   Neurological: Positive for dizziness. Negative for headaches.   All other systems reviewed and are negative.      Filed Vitals:    10/25/14 1445 10/25/14 1500 10/25/14 1507 10/25/14 1515   BP:   130/46    Pulse: 79 81 78 75   Temp:       Resp: '13 14 13 12   ' Height:       Weight:       SpO2: 98% 98% 98% 98%            Physical Exam   Constitutional: She is oriented to person, place, and time. She appears well-developed and well-nourished. No distress.   HENT:   Head: Normocephalic and atraumatic.   Mouth/Throat: Oropharynx is clear and moist.   Eyes: Conjunctivae and EOM are normal. Pupils are equal, round, and reactive to light. No scleral icterus.   Neck: Normal range of motion. Neck supple.   Cardiovascular: Normal rate and regular rhythm.    Murmur (3/6  systolic) heard.  Pulmonary/Chest: Effort normal and breath sounds normal. No respiratory distress.   Abdominal: Soft. Bowel sounds are normal. She exhibits no distension. There is no tenderness.   Musculoskeletal: She exhibits no edema.   +0/5 strength in left upper extremity   Lymphadenopathy:     She has no cervical adenopathy.   Neurological: She is alert and oriented to person, place, and time. Coordination normal.   Skin: Skin is warm and dry. No rash noted.   Psychiatric: She has a normal mood and affect. Her behavior is normal.   Nursing note and vitals reviewed.       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 in past now on pradaxa c/o increased dizziness this am upon awakening with repeat episode trying to get out of bathtub denies hitting head or other neuro weakness    Ekg: nsr rate 83 no acute st changes   Ct head no acute cva mri no acute cva pt stable in ED feels improved able to ambulate will dc home        Amount and/or Complexity of Data Reviewed  Clinical lab tests: ordered and reviewed   Tests in the radiology section of CPT??: ordered and reviewed  Independent visualization of images, tracings, or specimens: yes    Risk of Complications, Morbidity, and/or Mortality  Presenting problems: high  Diagnostic procedures: moderate  Management options: moderate        Procedures  Vitals:  Patient Vitals for the past 12 hrs:   Temp Pulse Resp BP SpO2   10/25/14 1515 - 75 12 - 98 %   10/25/14 1507 - 78 13 130/46 mmHg 98 %   10/25/14 1500 - 81 14 - 98 %   10/25/14 1445 - 79 13 - 98 %   10/25/14 1430 - 63 11 - 97 %   10/25/14 1415 - 66 11 - 97 %   10/25/14 1400 - 70 12 - 94 %   10/25/14 1345 - 86 16 - 97 %   10/25/14 1230 - 80 12 138/46 mmHg 97 %   10/25/14 1223 - 84 16 124/56 mmHg 96 %   10/25/14 1156 - 86 15 149/49 mmHg 96 %   10/25/14 1152 98.2 ??F (36.8 ??C) 83 14 156/42 mmHg 96 %   96% on RA, indicating adequate oxygenation.    Medications ordered:   Medications   insulin regular (NOVOLIN R, HUMULIN R) injection 10 Units (10 Units IntraVENous Given 10/25/14 1430)         Lab findings:  Recent Results (from the past 12 hour(s))   EKG, 12 LEAD, INITIAL    Collection Time: 10/25/14 11:52 AM   Result Value Ref Range    Ventricular Rate 83 BPM    Atrial Rate 83 BPM    P-R Interval 186 ms    QRS Duration 74 ms    Q-T Interval 378 ms    QTC Calculation (Bezet) 444 ms    Calculated P Axis 44 degrees    Calculated R Axis 54 degrees    Calculated T Axis 59 degrees    Diagnosis       Sinus rhythm with premature atrial complexes  Otherwise normal ECG  When compared with ECG of 09-Apr-2007 11:04,  premature atrial complexes are now present     PROTHROMBIN TIME + INR    Collection Time: 10/25/14  11:55 AM   Result Value Ref Range    Prothrombin time 15.4 (H) 11.5 - 15.2 sec    INR 1.3 (H) 0.8 - 1.2     PTT    Collection Time: 10/25/14 11:55 AM   Result Value Ref Range    aPTT 36.2 23.0 - 36.4 SEC   CBC WITH AUTOMATED DIFF    Collection Time: 10/25/14 11:55 AM   Result Value Ref Range    WBC 10.1 4.6 - 13.2 K/uL     RBC 3.97 (L) 4.20 - 5.30 M/uL    HGB 11.7 (L) 12.0 - 16.0 g/dL    HCT 35.4 35.0 - 45.0 %    MCV 89.2 74.0 - 97.0 FL    MCH 29.5 24.0 - 34.0 PG    MCHC 33.1 31.0 - 37.0 g/dL    RDW 13.5 11.6 - 14.5 %    PLATELET 210 135 - 420 K/uL    MPV 10.7 9.2 - 11.8 FL    NEUTROPHILS 81 (H) 40 - 73 %    LYMPHOCYTES 11 (L) 21 - 52 %    MONOCYTES 7 3 - 10 %    EOSINOPHILS 1 0 - 5 %    BASOPHILS 0 0 - 2 %    ABS. NEUTROPHILS 8.2 (H) 1.8 - 8.0 K/UL    ABS. LYMPHOCYTES 1.1 0.9 - 3.6 K/UL    ABS. MONOCYTES 0.7 0.05 - 1.2 K/UL    ABS. EOSINOPHILS 0.1 0.0 - 0.4 K/UL    ABS. BASOPHILS 0.0 0.0 - 0.06 K/UL    DF AUTOMATED     METABOLIC PANEL, COMPREHENSIVE    Collection Time: 10/25/14 11:55 AM   Result Value Ref Range    Sodium 142 136 - 145 mmol/L    Potassium 4.6 3.5 - 5.5 mmol/L    Chloride 105 100 - 108 mmol/L    CO2 23 21 - 32 mmol/L    Anion gap 14 3.0 - 18 mmol/L    Glucose 379 (H) 74 - 99 mg/dL    BUN 28 (H) 7.0 - 18 MG/DL    Creatinine 0.91 0.6 - 1.3 MG/DL    BUN/Creatinine ratio 31 (H) 12 - 20      GFR est AA >60 >60 ml/min/1.76m    GFR est non-AA >60 >60 ml/min/1.724m   Calcium 8.9 8.5 - 10.1 MG/DL    Bilirubin, total 0.6 0.2 - 1.0 MG/DL    ALT 21 13 - 56 U/L    AST 15 15 - 37 U/L    Alk. phosphatase 119 (H) 45 - 117 U/L    Protein, total 6.8 6.4 - 8.2 g/dL    Albumin 3.7 3.4 - 5.0 g/dL    Globulin 3.1 2.0 - 4.0 g/dL    A-G Ratio 1.2 0.8 - 1.7     CARDIAC PANEL,(CK, CKMB & TROPONIN)    Collection Time: 10/25/14 11:55 AM   Result Value Ref Range    CK 170 26 - 192 U/L    CK - MB 3.6 0.5 - 3.6 ng/ml    CK-MB Index 2.1 0.0 - 4.0 %    Troponin-I, Qt. <0.02 0.0 - 0.045 NG/ML   URINALYSIS W/ RFLX MICROSCOPIC    Collection Time: 10/25/14  1:43 PM   Result Value Ref Range    Color YELLOW      Appearance CLEAR      Specific gravity >1.030 (H) 1.005 - 1.030    pH (UA) 5.0 5.0 - 8.0  Protein NEGATIVE  NEG mg/dL    Glucose >1000 (A) NEG mg/dL    Ketone 15 (A) NEG mg/dL    Bilirubin NEGATIVE  NEG      Blood MODERATE (A) NEG       Urobilinogen 0.2 0.2 - 1.0 EU/dL    Nitrites NEGATIVE  NEG      Leukocyte Esterase TRACE (A) NEG     URINE MICROSCOPIC ONLY    Collection Time: 10/25/14  1:43 PM   Result Value Ref Range    WBC 0 to 3 0 - 4 /hpf    RBC 4 to 10 0 - 5 /hpf    Epithelial cells 1+ 0 - 5 /lpf    Bacteria 2+ (A) NEG /hpf    Mucus 1+ (A) NEG /lpf   GLUCOSE, POC    Collection Time: 10/25/14  3:15 PM   Result Value Ref Range    Glucose (POC) 258 (H) 70 - 110 mg/dL         X-Ray, CT or other radiology findings or impressions:  MRI BRAIN WO CONT   Final Result   IMPRESSION:  ??  Motion degraded study.  ??  1.?? No acute infarct, mass effect, or herniation.  ??  2.?? Encephalomalacic areas involving posterior lateral right temporal lobe and  right parietal lobe consistent with chronic infarcts.?? ??  ??  3. Additional encephalomalacic area involving the right basal ganglia slightly  extending into the adjacent white matter with evidence of hemosiderin staining  from prior hemorrhage, likely either hemorrhagic infarct or hypertensive  hemorrhage. No mass effect.  ??  4. Minimal nonspecific white matter disease likely representing chronic small  vessel changes.  ??  5. Small sized right MCA flow void with additional FLAIR hyperintense vessels  along the right sylvian fissure and multiple adjacent cortical sulci suggesting  high-grade MCA stenosis. This finding may correlate with the areas of suspected  chronic infarcts in expected right MCA territory. Follow-up MRA head/neck would  provide better vascular evaluation if indicated.  Interpreted by Radiology 4:30 PM        XR CHEST PORT   Final Result   IMPRESSION:  ??  Mild venous congestion and mild chronic appearing interstitial thickening versus  less likely edema. No focal infiltrates.  Interpreted by Radiology 2:20 PM        CT HEAD WO CONT   Final Result   IMPRESSION:  ??  Motion degraded study.  ??  1.?? No acute intracranial hemorrhage, mass effect, midline shift, or herniation.  ??   2. Areas of encephalomalacia involving the right temporal and parietal lobes as  well as portions of the right basal ganglia consistent with chronic infarcts  within portions of the expected right MCA territory. No prior comparison study.  3. No definite CT evidence of acute cortical infarct is seen.?? Please note that  noncontrast head CT may be normal in early acute infarct. ??    Interpreted by Radiology at 1:48 PM           Progress notes, Consult notes or additional Procedure notes:   12:38 PM Consult:  Discussed care with Dr. Michaelene Song. Standard discussion; including history of patient???s chief complaint, available diagnostic results, and treatment course. Agrees to patient consult.     12:41 PM Consult:  Discussed care with Dr. Michaelene Song. Standard discussion; including history of patient???s chief complaint, available diagnostic results, and treatment course. Advises MRI, 24 hour observation, and steroid treatment.  4:53 PM Reviewed MRI with Dr. Marcene Corning. Patient will be discharged home.     4:55 PM Discussed with patient labs and MRI findings. Patient was made aware of plans for discharge and agrees. All questions and concerns were answered at this time.     Disposition:  Diagnosis:   1. Dizziness        Disposition: discharged home in stable condition.    Follow-up Information     None          Patient's Medications   Start Taking    No medications on file   Continue Taking    CITALOPRAM HYDROBROMIDE (CELEXA PO)    Take  by mouth.    DABIGATRAN ETEXILATE MESYLATE (PRADAXA PO)    Take  by mouth.    INSULIN DETEMIR (LEVEMIR) 100 UNIT/ML INJECTION    by SubCUTAneous route nightly.    INSULIN LISPRO (HUMALOG) 100 UNIT/ML INJECTION    by SubCUTAneous route.   These Medications have changed    No medications on file   Stop Taking    No medications on file       Scribe Attestation:   October 25, 2014 at 11:48 AM - [place] for and in the presence of Dr.Heatherly Stenner Domenic Schwab, MD      Doroteo Glassman, Scribe    PROVIDER ATTESTATION  I personally performed the services described in the documentation, reviewed the documentation as recorded by the scribe in my presence and it accurately and completely records my words and actions. Threasa Beards, MD MD/DO

## 2014-10-26 LAB — EKG, 12 LEAD, INITIAL
Atrial Rate: 83 {beats}/min
Calculated P Axis: 44 degrees
Calculated R Axis: 54 degrees
Calculated T Axis: 59 degrees
P-R Interval: 186 ms
Q-T Interval: 378 ms
QRS Duration: 74 ms
QTC Calculation (Bezet): 444 ms
Ventricular Rate: 83 {beats}/min

## 2015-01-07 IMAGING — CR DG CHEST 2V
2 series · 2 of 2 positions shown · non-contrast
Comparison: None.

CLINICAL DATA: CVA, baseline.

EXAM:
CHEST  2 VIEW

[w chest lat]
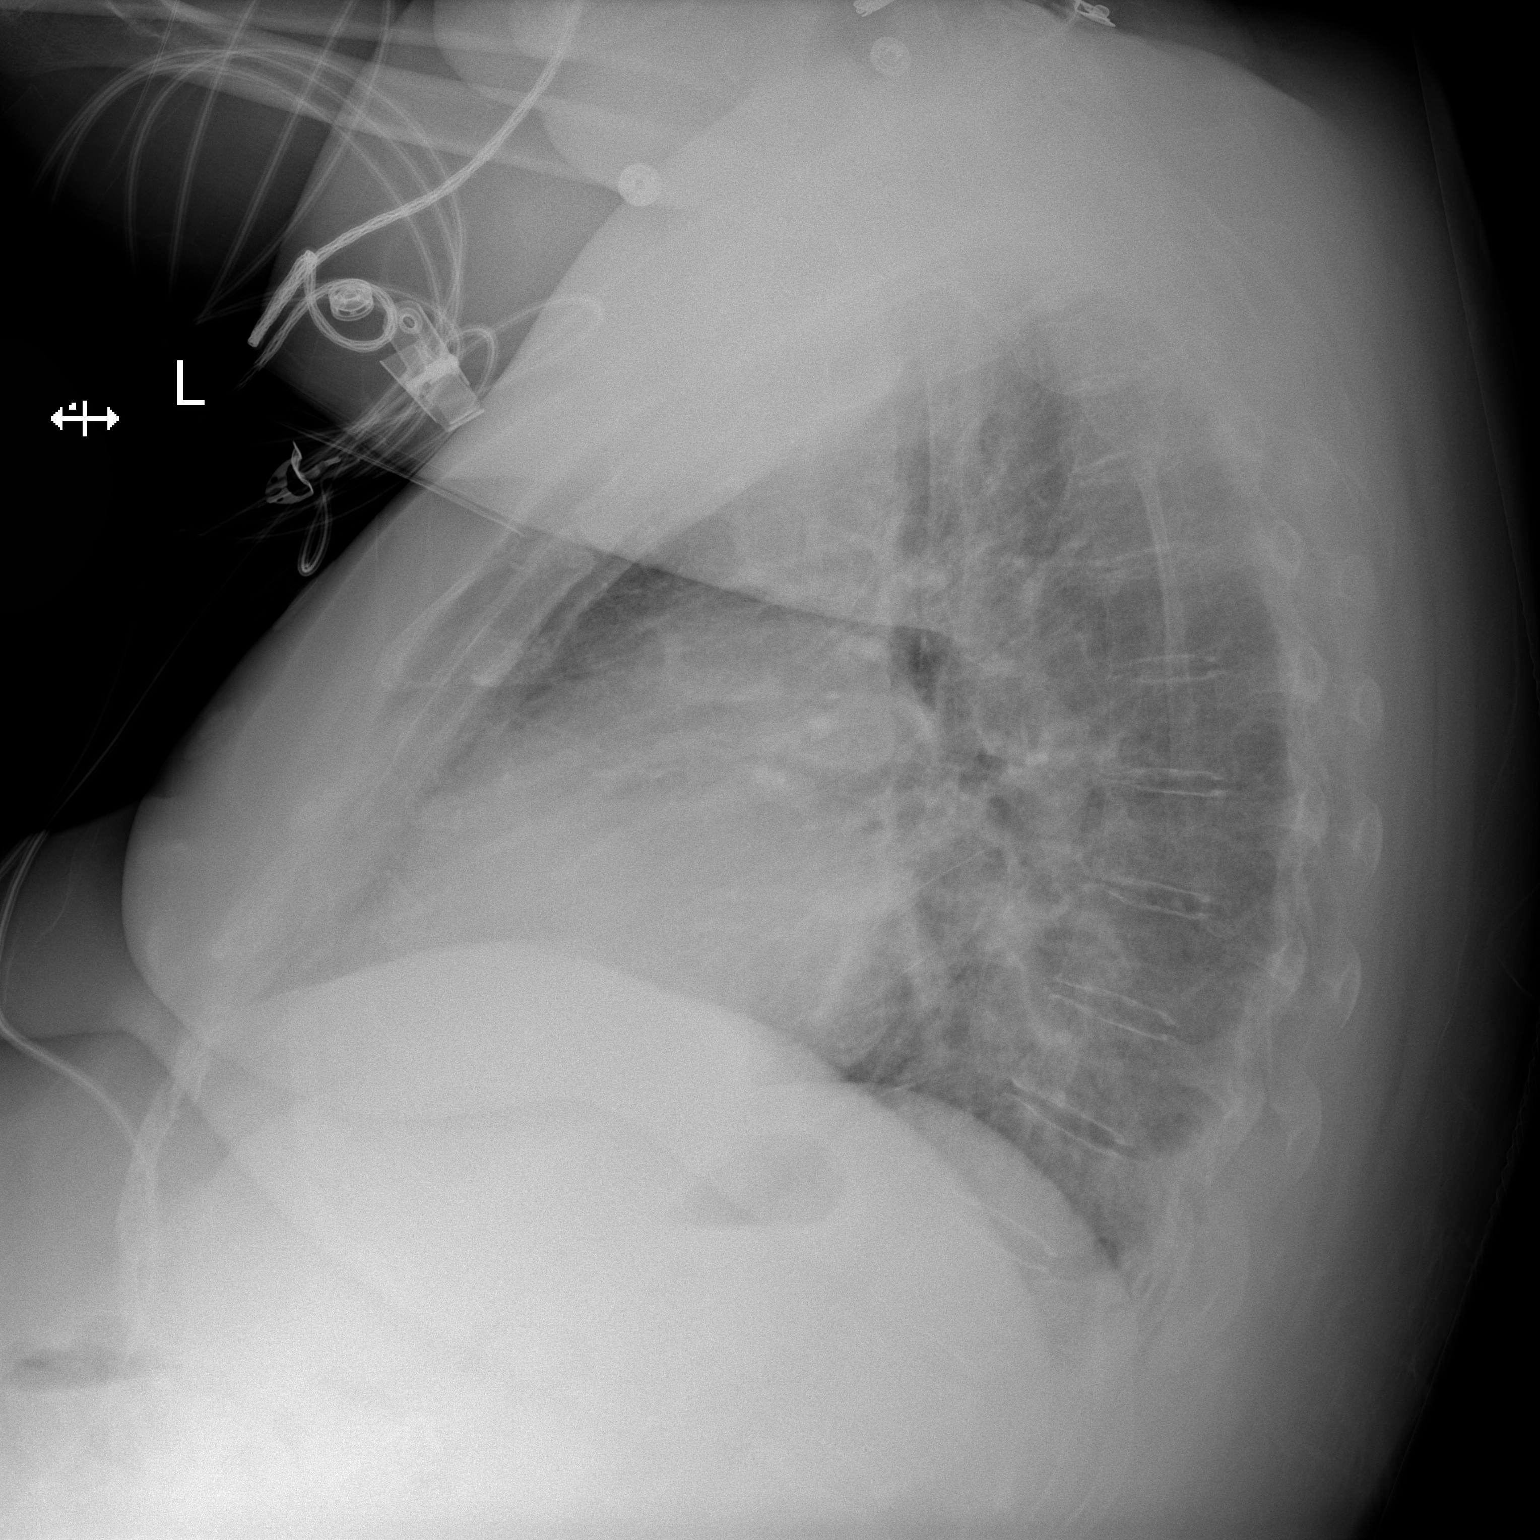

[x chest ap]
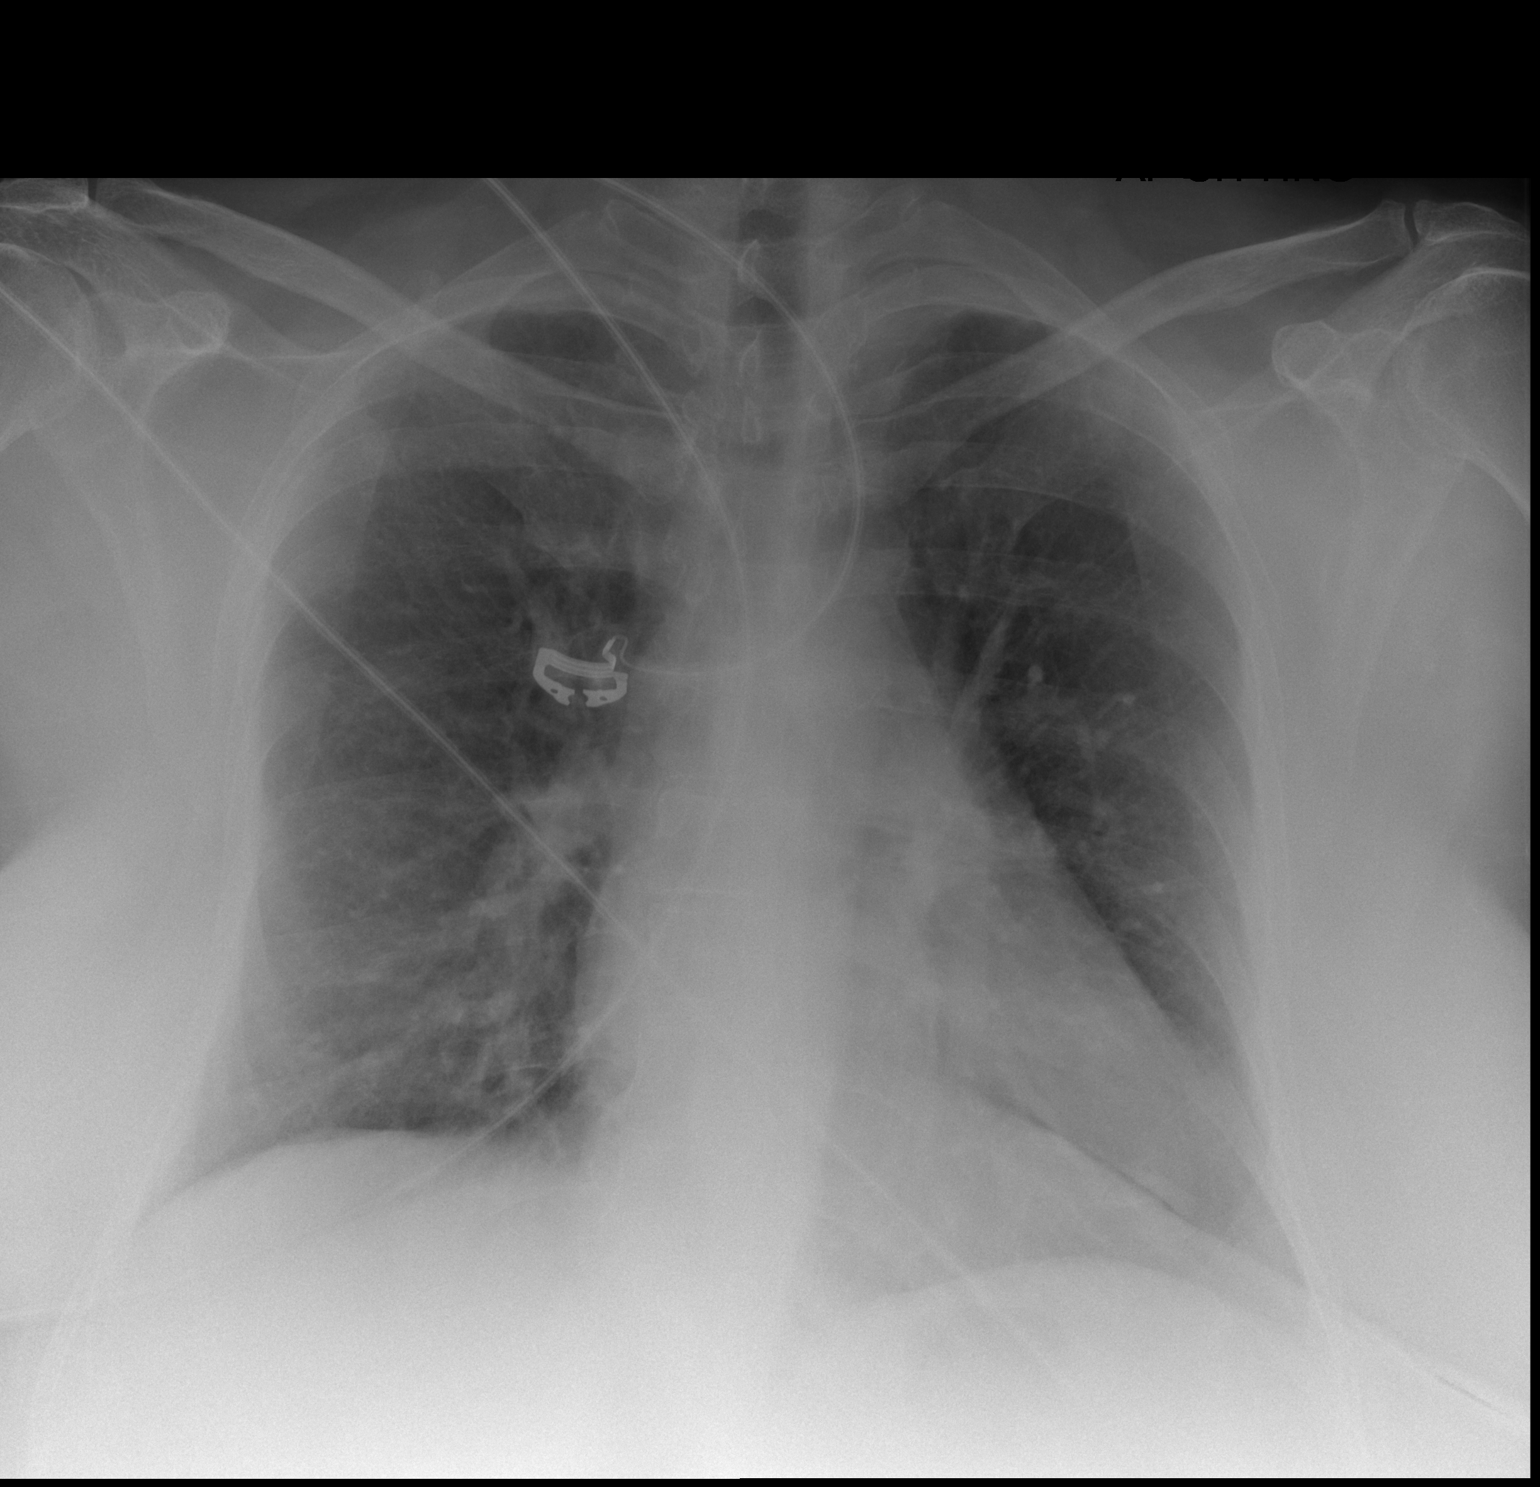

[2 of 2 positions shown; findings below may reference images not displayed]

FINDINGS: Trachea is midline. Heart size accentuated by AP technique. Biapical
pleural thickening. Lungs are otherwise clear. No pleural fluid.
IMPRESSION: No acute findings.

## 2015-01-07 IMAGING — CT CT HEAD W/O CM
2 series · 16 of 30 positions shown, 18 images · non-contrast
Comparison: None.

CLINICAL DATA: Slurred speech found unresponsive

EXAM:
CT HEAD WITHOUT CONTRAST
TECHNIQUE: Contiguous axial images were obtained from the base of the skull
through the vertex without intravenous contrast.

[Series 3: head w/o · axial · non-contrast · 0.49mm/px · z∈[+58,+183]mm · 8 of 33 slices shown, 10 images]
[im 4/33  brain]
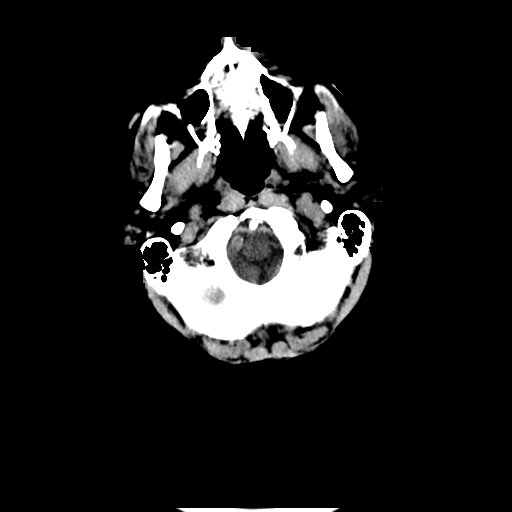
[im 4/33  bone]
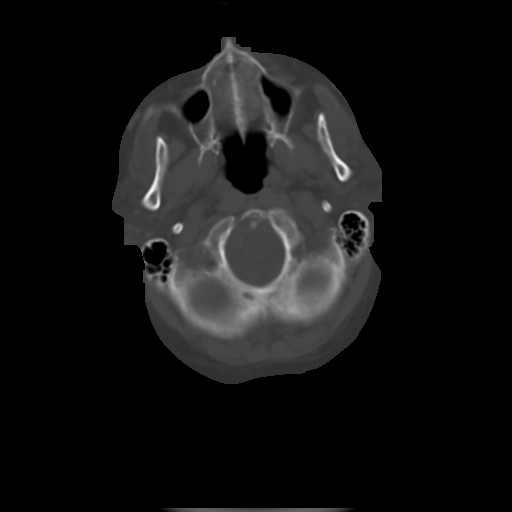
[im 8/33  brain]
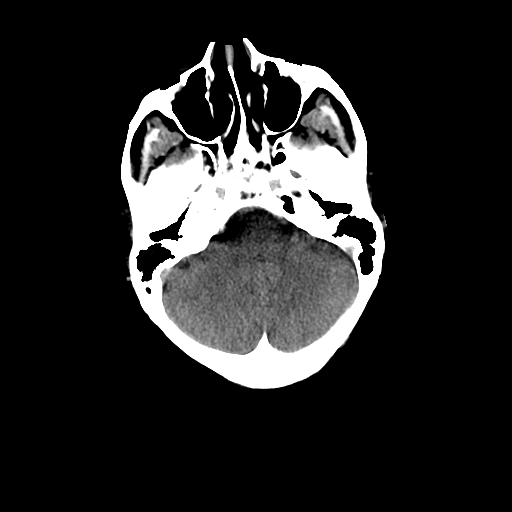
[im 11/33  brain]
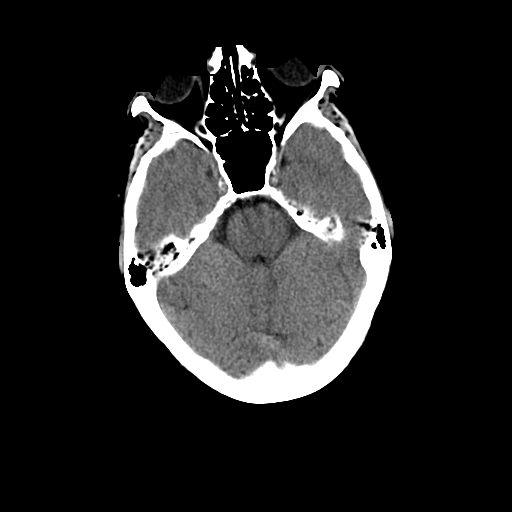
[im 15/33  brain]
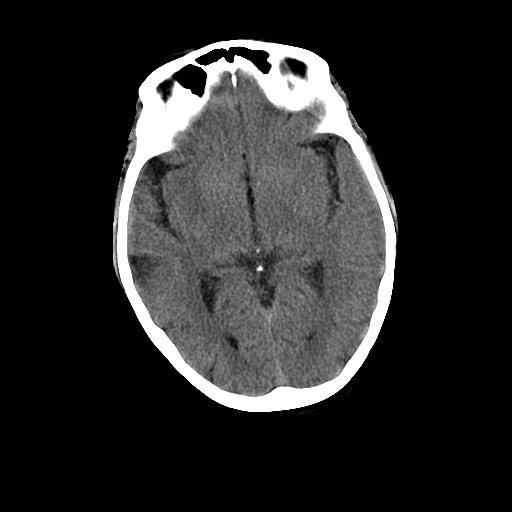
[im 18/33  brain]
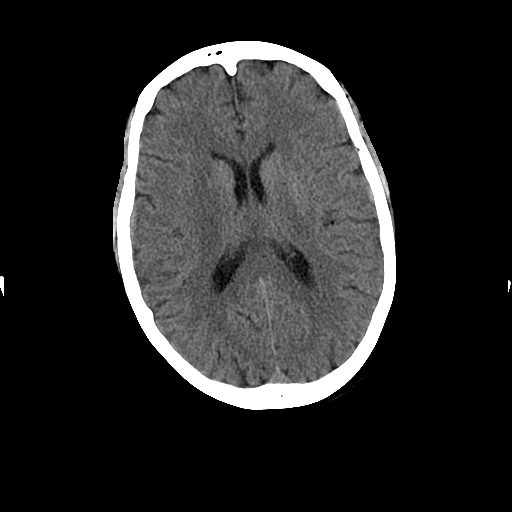
[im 18/33  bone]
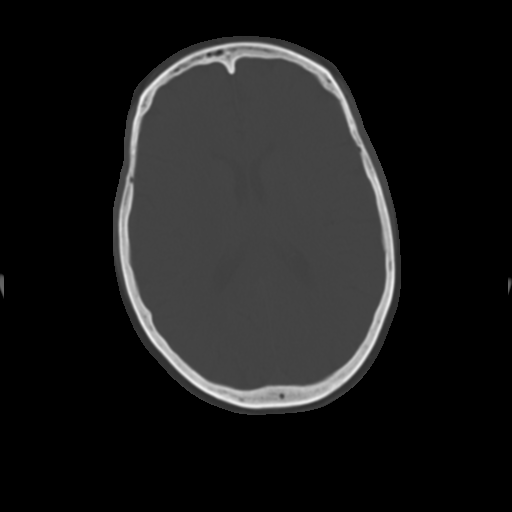
[im 22/33  brain]
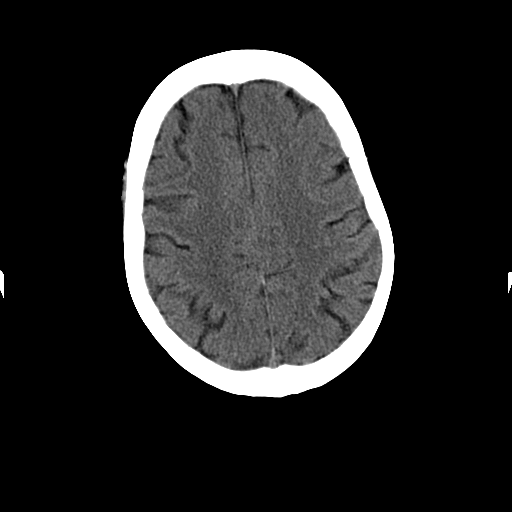
[im 25/33  brain]
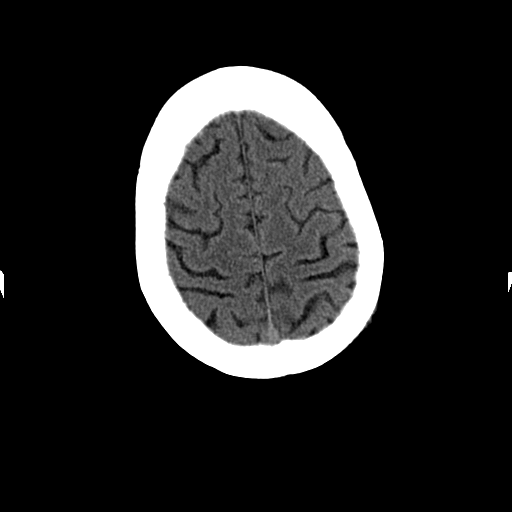
[im 29/33  brain]
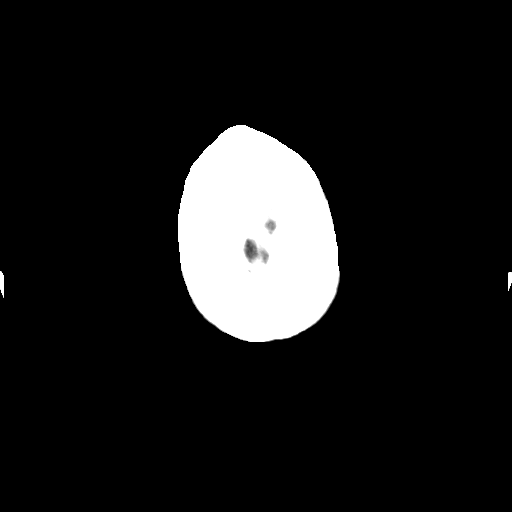

[Series 4: head w/o bone · axial · non-contrast · 0.49mm/px · z∈[+58,+186]mm · 8 of 65 slices shown]
[im 7/65  bone]
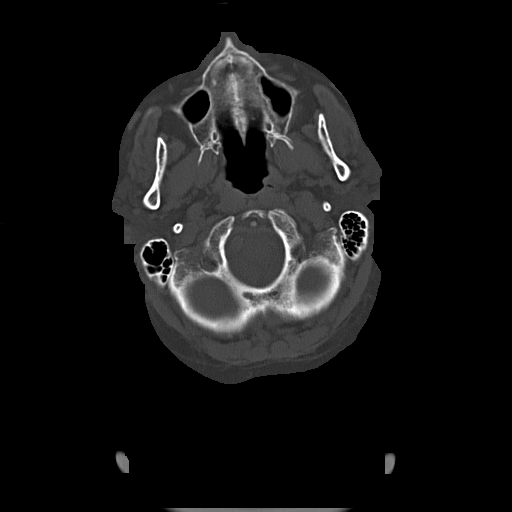
[im 14/65  bone]
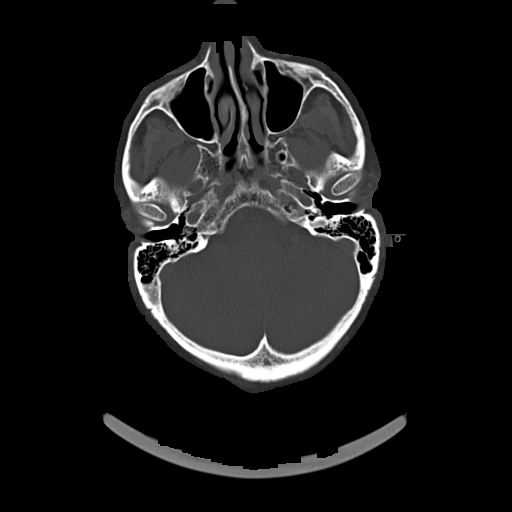
[im 21/65  bone]
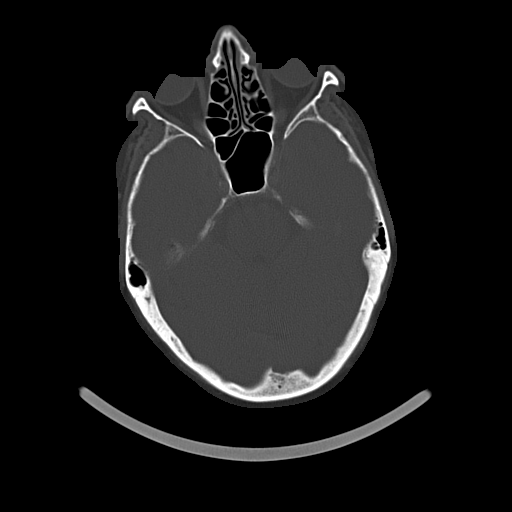
[im 27/65  bone]
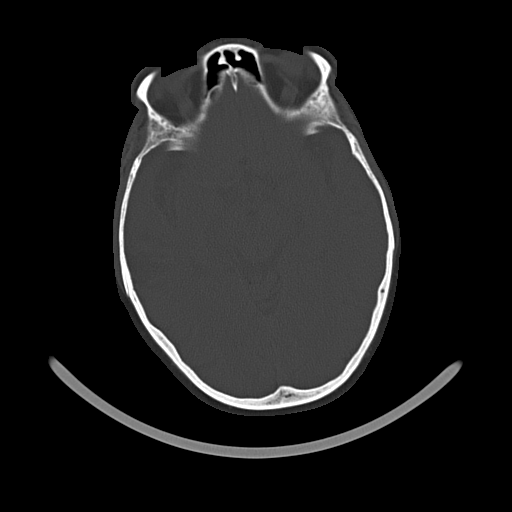
[im 38/65  bone]
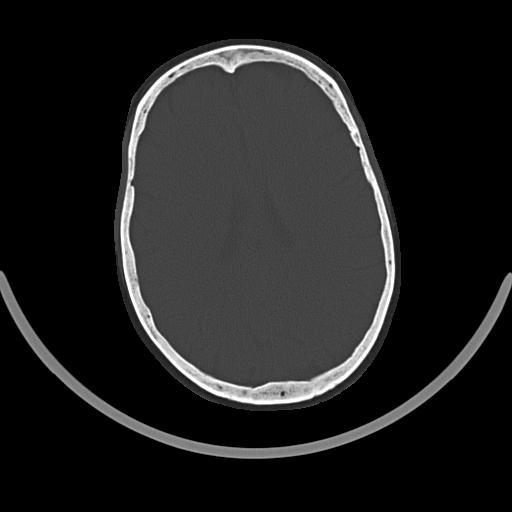
[im 44/65  bone]
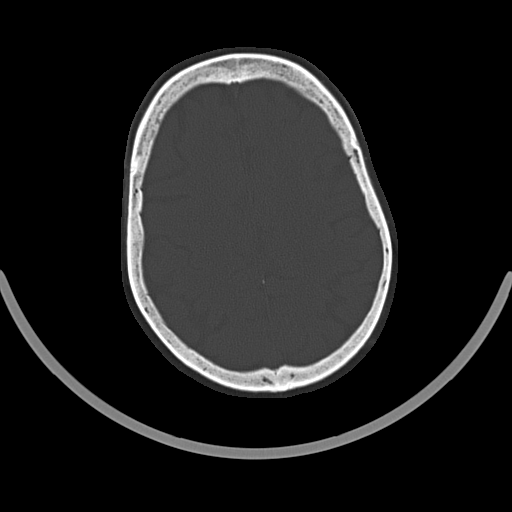
[im 51/65  bone]
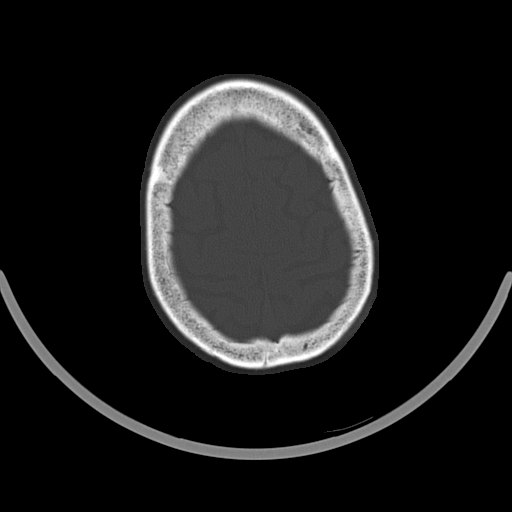
[im 58/65  bone]
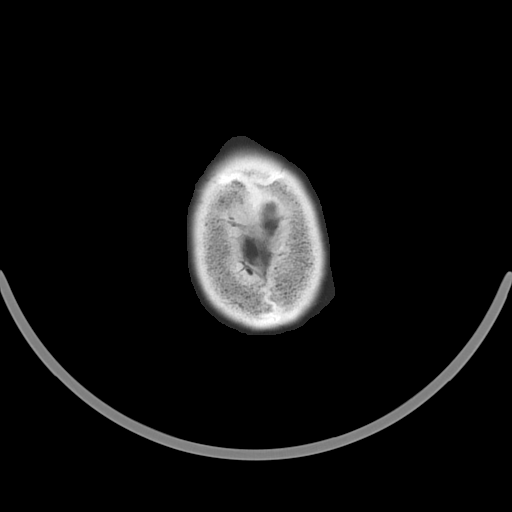

[16 of 30 positions shown; findings below may reference images not displayed]

FINDINGS: The bony calvarium is intact. No gross soft tissue abnormality is
noted. There is density identified within the right middle cerebral
artery which may represent acute thrombosis. No acute hemorrhage is
seen. The lateral most aspect of the basal ganglia on the right
there is some generalized decreased attenuation identified best seen
on image number 16 of series 3. This is suspicious for early
ischemic change. No other focal abnormality is noted.
IMPRESSION: Dense right middle cerebral artery as described.

Area of vague decreased attenuation in the right basal ganglia
laterally suspicious for acute ischemia.

These results were called by telephone at the time of interpretation
on 02/24/2013 at [DATE] to the attending emergency room physician,
who verbally acknowledged these results.

## 2015-04-21 ENCOUNTER — Inpatient Hospital Stay: Admit: 2015-04-21 | Payer: Self-pay | Primary: Family Medicine

## 2015-04-21 ENCOUNTER — Encounter

## 2015-04-21 DIAGNOSIS — Z01811 Encounter for preprocedural respiratory examination: Secondary | ICD-10-CM

## 2015-07-01 ENCOUNTER — Inpatient Hospital Stay
Admit: 2015-07-01 | Discharge: 2015-07-01 | Disposition: A | Discharge status: Home / Self Care | Payer: MEDICARE | Attending: Emergency Medicine

## 2015-07-01 DIAGNOSIS — L02211 Cutaneous abscess of abdominal wall: Secondary | ICD-10-CM

## 2015-07-01 LAB — METABOLIC PANEL, BASIC
Anion gap: 7 mmol/L (ref 3.0–18)
BUN/Creatinine ratio: 23 — ABNORMAL HIGH (ref 12–20)
BUN: 20 MG/DL — ABNORMAL HIGH (ref 7.0–18)
CO2: 27 mmol/L (ref 21–32)
Calcium: 8.8 MG/DL (ref 8.5–10.1)
Chloride: 102 mmol/L (ref 100–108)
Creatinine: 0.86 MG/DL (ref 0.6–1.3)
GFR est AA: >60 mL/min/{1.73_m2} (ref >60)
GFR est non-AA: >60 mL/min/{1.73_m2} (ref >60)
Glucose: 107 mg/dL — ABNORMAL HIGH (ref 74–99)
Potassium: 4 mmol/L (ref 3.5–5.5)
Sodium: 136 mmol/L (ref 136–145)

## 2015-07-01 LAB — CBC WITH AUTOMATED DIFF
ABS. BASOPHILS: 0 10*3/uL (ref 0.0–0.06)
ABS. EOSINOPHILS: 0.1 10*3/uL (ref 0.0–0.4)
ABS. LYMPHOCYTES: 1.3 10*3/uL (ref 0.9–3.6)
ABS. MONOCYTES: 0.4 10*3/uL (ref 0.05–1.2)
ABS. NEUTROPHILS: 5.4 10*3/uL (ref 1.8–8.0)
BASOPHILS: 0 % (ref 0–2)
EOSINOPHILS: 1 % (ref 0–5)
HCT: 32 % — ABNORMAL LOW (ref 35.0–45.0)
HGB: 10.3 g/dL — ABNORMAL LOW (ref 12.0–16.0)
LYMPHOCYTES: 18 % — ABNORMAL LOW (ref 21–52)
MCH: 27.5 PG (ref 24.0–34.0)
MCHC: 32.2 g/dL (ref 31.0–37.0)
MCV: 85.6 FL (ref 74.0–97.0)
MONOCYTES: 5 % (ref 3–10)
MPV: 9.6 FL (ref 9.2–11.8)
NEUTROPHILS: 76 % — ABNORMAL HIGH (ref 40–73)
PLATELET: 214 10*3/uL (ref 135–420)
RBC: 3.74 M/uL — ABNORMAL LOW (ref 4.20–5.30)
RDW: 14.7 % — ABNORMAL HIGH (ref 11.6–14.5)
WBC: 7.2 10*3/uL (ref 4.6–13.2)

## 2015-07-01 MED ORDER — OXYCODONE-ACETAMINOPHEN 5 MG-325 MG TAB
5-325 mg | ORAL_TABLET | Freq: Four times a day (QID) | ORAL | 0 refills | Status: AC | PRN
Start: 2015-07-01 — End: 2015-07-08

## 2015-07-01 MED ORDER — TRIMETHOPRIM-SULFAMETHOXAZOLE 160 MG-800 MG TAB
160-800 mg | ORAL_TABLET | Freq: Two times a day (BID) | ORAL | 0 refills | Status: AC
Start: 2015-07-01 — End: 2015-07-08

## 2015-07-01 MED ORDER — CEPHALEXIN 250 MG CAP
250 mg | ORAL | Status: AC
Start: 2015-07-01 — End: 2015-07-01
  Administered 2015-07-01: 17:00:00 via ORAL

## 2015-07-01 MED ORDER — TRIMETHOPRIM-SULFAMETHOXAZOLE 160 MG-800 MG TAB
160-800 mg | ORAL | Status: AC
Start: 2015-07-01 — End: 2015-07-01
  Administered 2015-07-01: 17:00:00 via ORAL

## 2015-07-01 MED ORDER — CEPHALEXIN 500 MG CAP
500 mg | ORAL_CAPSULE | Freq: Four times a day (QID) | ORAL | 0 refills | Status: AC
Start: 2015-07-01 — End: 2015-07-08

## 2015-07-01 MED ORDER — CEPHALEXIN 250 MG CAP
250 mg | ORAL | Status: AC
Start: 2015-07-01 — End: 2015-07-01
  Administered 2015-07-01: 18:00:00 via ORAL

## 2015-07-01 MED FILL — CEPHALEXIN 250 MG CAP: 250 mg | ORAL | Qty: 2

## 2015-07-01 MED FILL — TRIMETHOPRIM-SULFAMETHOXAZOLE 160 MG-800 MG TAB: 160-800 mg | ORAL | Qty: 2

## 2015-07-01 MED FILL — CEPHALEXIN 250 MG CAP: 250 mg | ORAL | Qty: 1

## 2015-07-01 NOTE — ED Provider Notes (Signed)
HPI Comments: 1:23 PM Ann Jacobson is a 65 y.o. Female with a history of CVA and DM presenting to the ED with abscess to the abd in the LLQ. The patient states she applied neosporin after her shower. She also states she has had these on her back before that had to be surgically removed.  She rates the pain as 10/10. Pt denies nausea, vomiting, diarrhea, fever, CP, and cough. No other complaints at this time.                 Patient is a 65 y.o. female presenting with skin problem. The history is provided by the patient.   Skin Problem           Past Medical History:   Diagnosis Date   ??? CVA (cerebral vascular accident) (HCC)    ??? Diabetes (HCC)    ??? 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 HX CATARACT REMOVAL           History reviewed. No pertinent family history.    Social History     Social History   ??? Marital status: DIVORCED     Spouse name: N/A   ??? Number of children: N/A   ??? Years of education: N/A     Occupational History   ??? Not on file.     Social History Main Topics   ??? Smoking status: Never Smoker   ??? Smokeless tobacco: Not on file   ??? Alcohol use No   ??? Drug use: Not on file   ??? Sexual activity: Not on file     Other Topics Concern   ??? Not on file     Social History Narrative         ALLERGIES: Pcn [penicillins]    Review of Systems   Constitutional: Negative for chills, diaphoresis, fatigue, fever and unexpected weight change.   HENT: Negative for congestion, dental problem, ear discharge, ear pain, hearing loss, nosebleeds, postnasal drip, sinus pressure, sore throat, trouble swallowing and voice change.    Eyes: Negative for photophobia, pain, discharge, redness and visual disturbance.   Respiratory: Negative for cough, chest tightness, shortness of breath, wheezing and stridor.    Cardiovascular: Negative for chest pain, palpitations and leg swelling.   Gastrointestinal: Negative for abdominal distention, abdominal pain, anal  bleeding, blood in stool, constipation, diarrhea, nausea and vomiting.   Genitourinary: Negative for difficulty urinating, dyspareunia, dysuria, flank pain, frequency, genital sores, hematuria, menstrual problem, pelvic pain, urgency, vaginal bleeding, vaginal discharge and vaginal pain.   Musculoskeletal: Negative for arthralgias, back pain, joint swelling, myalgias, neck pain and neck stiffness.   Skin: Positive for wound. Negative for color change and rash.   Neurological: Negative for dizziness, tremors, seizures, syncope, weakness, light-headedness, numbness and headaches.   Hematological: Negative for adenopathy. Does not bruise/bleed easily.   Psychiatric/Behavioral: Negative for agitation, confusion, decreased concentration, hallucinations, sleep disturbance and suicidal ideas. The patient is not nervous/anxious.    All other systems reviewed and are negative.      Vitals:    07/01/15 1323   BP: 151/73   Pulse: 88   Resp: 16   Temp: 98.5 ??F (36.9 ??C)   SpO2: 98%   Weight: 127 kg (280 lb)   Height:  (1.702 m)            Physical Exam   Constitutional: She  is oriented to person, place, and time. She appears well-developed and well-nourished. She appears distressed.   65 year old Morbidly obese Caucasian female in moderate painful distress.   HENT:   Head: Normocephalic and atraumatic.   Right Ear: External ear normal.   Left Ear: External ear normal.   Nose: Nose normal.   Mouth/Throat: Oropharynx is clear and moist. No oropharyngeal exudate.   Eyes: Conjunctivae and EOM are normal. Pupils are equal, round, and reactive to light. Right eye exhibits no discharge. Left eye exhibits no discharge. No scleral icterus.   Neck: No JVD present. No tracheal deviation present. No thyromegaly present.   Cardiovascular: Normal rate, regular rhythm, normal heart sounds and intact distal pulses.  Exam reveals no gallop and no friction rub.    No murmur heard.   Pulmonary/Chest: Effort normal and breath sounds normal. No stridor. No respiratory distress. She has no wheezes. She has no rales. She exhibits no tenderness.   Abdominal: Soft. Bowel sounds are normal. She exhibits no distension and no mass. There is no tenderness. There is no rebound and no guarding.   Large pannus, abscess noted, see skin examination    Musculoskeletal: Normal range of motion. She exhibits no edema, tenderness or deformity.   Lymphadenopathy:     She has no cervical adenopathy.   Neurological: She is alert and oriented to person, place, and time. No cranial nerve deficit.   Skin: Skin is warm and dry. No rash noted. She is not diaphoretic. No erythema. No pallor.   2 x 2 cm tender, erythematous, indurated mass left lower abdominal wall with surrounding erythema and purulent drainage from the site.    Psychiatric: She has a normal mood and affect. Her behavior is normal. Judgment and thought content normal.   Nursing note and vitals reviewed.       MDM  Number of Diagnoses or Management Options  Abscess:      Amount and/or Complexity of Data Reviewed  Clinical lab tests: ordered and reviewed  Tests in the medicine section of CPT??: ordered and reviewed  Decide to obtain previous medical records or to obtain history from someone other than the patient: yes  Review and summarize past medical records: yes  Independent visualization of images, tracings, or specimens: yes    Risk of Complications, Morbidity, and/or Mortality  Presenting problems: moderate  Diagnostic procedures: moderate  Management options: moderate    Patient Progress  Patient progress: stable    ED Course       I&D Abcess Simple  Date/Time: 07/01/2015 1:27 PM  Performed by: Llana Aliment  Authorized by: Llana Aliment     Consent:     Consent obtained:  Verbal and written    Consent given by:  Parent, spouse, guardian and patient    Risks discussed:  Bleeding, incomplete drainage, pain and infection     Alternatives discussed:  No treatment  Location:     Type:  Abscess    Location:  Trunk    Trunk location:  Abdomen  Pre-procedure details:     Skin preparation:  Betadine  Procedure type:     Complexity:  Simple  Procedure details:     Needle aspiration: no      Incision types:  Single straight    Incision depth:  Submucosal    Scalpel blade:  11    Wound management:  Irrigated with saline    Drainage:  Bloody and purulent    Drainage amount:  Moderate  Packing materials:  1/4 in iodoform gauze  Post-procedure details:     Patient tolerance of procedure:  Tolerated well, no immediate complications            -------------------------------------------------------------------------------------------------------------------  PROGRESS NOTE:  15:00 PM Upon re-evaluation the patient's symptoms have improved. Pt has non-toxic appearance and condition is stable for discharge. She was informed of her results, instructed to f/u with her PCP and return to the ED upon worsening of symptoms. All questions and concerns were addressed.      ORDERS:  Orders Placed This Encounter   ??? I&D ABCESS SIMP   ??? CULTURE, BLOOD   ??? CULTURE, BLOOD   ??? CULTURE, WOUND W GRAM STAIN   ??? CBC WITH AUTOMATED DIFF   ??? METABOLIC PANEL, BASIC   ??? pramipexole (MIRAPEX) 0.5 mg tablet   ??? trimethoprim-sulfamethoxazole (BACTRIM DS, SEPTRA DS) 160-800 mg per tablet 2 Tab   ??? cephALEXin (KEFLEX) capsule 500 mg   ??? cephALEXin (KEFLEX) capsule 250 mg           EKG INTERPRETATIONS:      RADIOLOGY RESULTS:    No orders to display         LAB RESULTS:    Recent Results (from the past 12 hour(s))   CULTURE, WOUND W GRAM STAIN    Collection Time: 07/01/15  1:20 PM   Result Value Ref Range    Special Requests: NO SPECIAL REQUESTS      GRAM STAIN MODERATE  WBC'S        GRAM STAIN FEW  GRAM NEGATIVE RODS        Culture result: PENDING    CBC WITH AUTOMATED DIFF    Collection Time: 07/01/15  1:45 PM   Result Value Ref Range    WBC 7.2 4.6 - 13.2 K/uL     RBC 3.74 (L) 4.20 - 5.30 M/uL    HGB 10.3 (L) 12.0 - 16.0 g/dL    HCT 16.1 (L) 09.6 - 45.0 %    MCV 85.6 74.0 - 97.0 FL    MCH 27.5 24.0 - 34.0 PG    MCHC 32.2 31.0 - 37.0 g/dL    RDW 04.5 (H) 40.9 - 14.5 %    PLATELET 214 135 - 420 K/uL    MPV 9.6 9.2 - 11.8 FL    NEUTROPHILS 76 (H) 40 - 73 %    LYMPHOCYTES 18 (L) 21 - 52 %    MONOCYTES 5 3 - 10 %    EOSINOPHILS 1 0 - 5 %    BASOPHILS 0 0 - 2 %    ABS. NEUTROPHILS 5.4 1.8 - 8.0 K/UL    ABS. LYMPHOCYTES 1.3 0.9 - 3.6 K/UL    ABS. MONOCYTES 0.4 0.05 - 1.2 K/UL    ABS. EOSINOPHILS 0.1 0.0 - 0.4 K/UL    ABS. BASOPHILS 0.0 0.0 - 0.06 K/UL    DF AUTOMATED     METABOLIC PANEL, BASIC    Collection Time: 07/01/15  1:45 PM   Result Value Ref Range    Sodium 136 136 - 145 mmol/L    Potassium 4.0 3.5 - 5.5 mmol/L    Chloride 102 100 - 108 mmol/L    CO2 27 21 - 32 mmol/L    Anion gap 7 3.0 - 18 mmol/L    Glucose 107 (H) 74 - 99 mg/dL    BUN 20 (H) 7.0 - 18 MG/DL    Creatinine 8.11 0.6 - 1.3 MG/DL  BUN/Creatinine ratio 23 (H) 12 - 20      GFR est AA >60 >60 ml/min/1.5673m2    GFR est non-AA >60 >60 ml/min/1.8173m2    Calcium 8.8 8.5 - 10.1 MG/DL   CULTURE, BLOOD    Collection Time: 07/01/15  2:00 PM   Result Value Ref Range    Special Requests: PERIPHERAL      Culture result: PENDING    CULTURE, BLOOD    Collection Time: 07/01/15  2:20 PM   Result Value Ref Range    Special Requests: PERIPHERAL      Culture result: PENDING          DISPOSITION:  Diagnosis: No diagnosis found.        Disposition: discharged      Follow-up Information     None                 Patient's Medications   Start Taking    No medications on file   Continue Taking    CITALOPRAM HYDROBROMIDE (CELEXA PO)    Take  by mouth.    DABIGATRAN ETEXILATE MESYLATE (PRADAXA PO)    Take  by mouth.    INSULIN DETEMIR (LEVEMIR) 100 UNIT/ML INJECTION    by SubCUTAneous route nightly.    INSULIN LISPRO (HUMALOG) 100 UNIT/ML INJECTION    by SubCUTAneous route.     PRAMIPEXOLE (MIRAPEX) 0.5 MG TABLET    Take 0.5 mg by mouth three (3) times daily.   These Medications have changed    No medications on file   Stop Taking    No medications on file         -------------------------------------------------------------------------------------------------------------------  Scribe Attestation:  I, Earnestine LeysMorgan Bryant, am scribing for and in the presence of Royal Hawthornlarence G Bronte Sabado, DO.    Signed by: Earnestine LeysMorgan Bryant, Scribe, 07/01/15, 1:26 PM      Provider Attestation:  I personally performed the services described in the documentation, reviewed the documentation, as recorded by the scribe in my presence, and it accurately and completely records my words and actions.  Dr. Venancio Poissonlarence G. Daveyon Kitchings D.O. 1:26 PM

## 2015-07-01 NOTE — ED Triage Notes (Signed)
Pt c/o abcess to abdomen.

## 2015-07-03 ENCOUNTER — Inpatient Hospital Stay
Admit: 2015-07-03 | Discharge: 2015-07-03 | Disposition: A | Discharge status: Home / Self Care | Payer: MEDICARE | Attending: Emergency Medicine

## 2015-07-03 DIAGNOSIS — Z4801 Encounter for change or removal of surgical wound dressing: Secondary | ICD-10-CM

## 2015-07-03 NOTE — ED Notes (Signed)
Patient stated understanding of discharge instructions. Patient was ambulatory upon discharge. Patient received no prescriptions.    Patient armband removed and shredded

## 2015-07-03 NOTE — ED Provider Notes (Signed)
HPI Comments: 11:13 AM Ann Jacobson is a 65 y.o. female with a history of CVA and DM presenting to the ED for wound check after and I&D procedure in the ED three days ago. She states she accidentally removed the gauze from the site last night while sleeping. She also states she has had increased pain to the area since the draining. Pt denies nausea, vomiting, diarrhea, fever, CP, and cough. No other complaints at this time.                 Patient is a 65 y.o. female presenting with wound check. The history is provided by the patient.   Wound Check    Pertinent negatives include no numbness, no back pain and no neck pain.        Past Medical History:   Diagnosis Date   ??? CVA (cerebral vascular accident) (HCC)    ??? Diabetes (HCC)    ??? 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 HX CATARACT REMOVAL           History reviewed. No pertinent family history.    Social History     Social History   ??? Marital status: DIVORCED     Spouse name: N/A   ??? Number of children: N/A   ??? Years of education: N/A     Occupational History   ??? Not on file.     Social History Main Topics   ??? Smoking status: Never Smoker   ??? Smokeless tobacco: Not on file   ??? Alcohol use No   ??? Drug use: Not on file   ??? Sexual activity: Not on file     Other Topics Concern   ??? Not on file     Social History Narrative         ALLERGIES: Pcn [penicillins]    Review of Systems   Constitutional: Negative for chills, diaphoresis, fatigue, fever and unexpected weight change.   HENT: Negative for congestion, dental problem, ear discharge, ear pain, hearing loss, nosebleeds, postnasal drip, sinus pressure, sore throat, trouble swallowing and voice change.    Eyes: Negative for photophobia, pain, discharge, redness and visual disturbance.   Respiratory: Negative for cough, chest tightness, shortness of breath, wheezing and stridor.     Cardiovascular: Negative for chest pain, palpitations and leg swelling.   Gastrointestinal: Negative for abdominal distention, abdominal pain, anal bleeding, blood in stool, constipation, diarrhea, nausea and vomiting.   Genitourinary: Negative for difficulty urinating, dyspareunia, dysuria, flank pain, frequency, genital sores, hematuria, menstrual problem, pelvic pain, urgency, vaginal bleeding, vaginal discharge and vaginal pain.   Musculoskeletal: Negative for arthralgias, back pain, joint swelling, myalgias, neck pain and neck stiffness.   Skin: Positive for wound (LLQ abd abscess). Negative for color change and rash.   Neurological: Negative for dizziness, tremors, seizures, syncope, weakness, light-headedness, numbness and headaches.   Hematological: Negative for adenopathy. Does not bruise/bleed easily.   Psychiatric/Behavioral: Negative for agitation, confusion, decreased concentration, hallucinations, sleep disturbance and suicidal ideas. The patient is not nervous/anxious.    All other systems reviewed and are negative.      Vitals:    07/03/15 1109   BP: 144/65   Pulse: 96   Resp: 20   Temp: 98.5 ??F (36.9 ??C)   SpO2: 100%            Physical Exam   Constitutional:  She is oriented to person, place, and time. She appears well-developed and well-nourished. No distress.   65 year old morbidly obese Caucasian female in no acute distress.    HENT:   Head: Normocephalic and atraumatic.   Right Ear: External ear normal.   Left Ear: External ear normal.   Nose: Nose normal.   Mouth/Throat: Oropharynx is clear and moist. No oropharyngeal exudate.   Eyes: Conjunctivae and EOM are normal. Pupils are equal, round, and reactive to light. Right eye exhibits no discharge. Left eye exhibits no discharge. No scleral icterus.   Neck: Normal range of motion. Neck supple. No JVD present. No tracheal deviation present. No thyromegaly present.   Cardiovascular: Normal rate, regular rhythm, normal heart sounds and  intact distal pulses.  Exam reveals no gallop and no friction rub.    No murmur heard.  Pulmonary/Chest: Effort normal and breath sounds normal. No stridor. No respiratory distress. She has no wheezes. She has no rales. She exhibits no tenderness.   Abdominal: Soft. Bowel sounds are normal. She exhibits no distension and no mass. There is no tenderness. There is no rebound and no guarding.   Large pannus   Musculoskeletal: Normal range of motion. She exhibits no edema or tenderness.   LUE contracture.  Ambulates with a cane   Lymphadenopathy:     She has no cervical adenopathy.   Neurological: She is alert and oriented to person, place, and time. No cranial nerve deficit.   Skin: Skin is warm and dry. No rash noted. She is not diaphoretic. No erythema. No pallor.   Healing abscess/wound LLQ.  Improving erythema noted, no purulent drainage.   Psychiatric: She has a normal mood and affect. Her behavior is normal. Judgment and thought content normal.   Nursing note and vitals reviewed.       MDM  Number of Diagnoses or Management Options     Amount and/or Complexity of Data Reviewed  Decide to obtain previous medical records or to obtain history from someone other than the patient: yes  Review and summarize past medical records: yes      ED Course       I&D Abcess Simple  Date/Time: 07/03/2015 11:32 AM  Performed by: Llana AlimentLARKE, Brittanyann Wittner  Authorized by: Llana AlimentLARKE, Steve Gregg     Consent:     Consent obtained:  Verbal    Consent given by:  Patient    Risks discussed:  Bleeding, incomplete drainage and pain  Location:     Type:  Abscess    Location:  Trunk    Trunk location:  Abdomen  Pre-procedure details:     Skin preparation:  Betadine  Procedure details:     Needle aspiration: no      Packing materials:  1/4 in iodoform gauze  Post-procedure details:     Patient tolerance of procedure:  Tolerated well, no immediate complications  Comments:      Wound repacked         -------------------------------------------------------------------------------------------------------------------  PROGRESS NOTE:  11:33 AM Pt reevaluated at this time and is resting comfortably in NAD. Discussed results and findings, as well as, diagnosis and plan for discharge. Pt verbalizes understanding and agreement with plan. All questions addressed at this time.      ORDERS:  Orders Placed This Encounter   ??? I&D ABCESS SIMP   ??? OTHER PROCEDURE (ASAP ONLY)             DISPOSITION:  Diagnosis:   1. Encounter for wound re-check  Disposition: discharged      Follow-up Information     Follow up With Details Comments Contact Info    C Stokes Matthias Hughs., MD Schedule an appointment as soon as possible for a visit in 1 day Return to the ED, If symptoms worsen [address]  [place] [address]  [phone]                   Patient's Medications   Start Taking    No medications on file   Continue Taking    CEPHALEXIN (KEFLEX) 500 MG CAPSULE    Take 1 Cap by mouth four (4) times daily for 7 days.    CITALOPRAM HYDROBROMIDE (CELEXA PO)    Take  by mouth.    DABIGATRAN ETEXILATE MESYLATE (PRADAXA PO)    Take  by mouth.    INSULIN DETEMIR (LEVEMIR) 100 UNIT/ML INJECTION    by SubCUTAneous route nightly.    INSULIN LISPRO (HUMALOG) 100 UNIT/ML INJECTION    by SubCUTAneous route.    OXYCODONE-ACETAMINOPHEN (PERCOCET) 5-325 MG PER TABLET    Take 1 Tab by mouth every six (6) hours as needed for Pain (1 to 2 tablets) for up to 7 days. Max Daily Amount: 4 Tabs.    PRAMIPEXOLE (MIRAPEX) 0.5 MG TABLET    Take 0.5 mg by mouth three (3) times daily.    TRIMETHOPRIM-SULFAMETHOXAZOLE (BACTRIM DS) 160-800 MG PER TABLET    Take 1 Tab by mouth two (2) times a day for 7 days.   These Medications have changed    No medications on file   Stop Taking    No medications on file         -------------------------------------------------------------------------------------------------------------------  Scribe Attestation:   I, Earnestine Leys, am scribing for and in the presence of Royal Hawthorn, DO.    Signed by: Earnestine Leys, Scribe, 07/03/15, 11:19 AM      Provider Attestation:  I personally performed the services described in the documentation, reviewed the documentation, as recorded by the scribe in my presence, and it accurately and completely records my words and actions.  Dr. Venancio Poisson. Tomasa Dobransky D.O. 11:19 AM

## 2015-07-03 NOTE — ED Triage Notes (Signed)
Pt arrived through triage with need for wound recheck on abdomen.

## 2015-07-07 LAB — CULTURE, WOUND W GRAM STAIN

## 2015-07-07 LAB — CULTURE, BLOOD
Culture result:: NO GROWTH
Culture result:: NO GROWTH
